# Patient Record
Sex: Female | Born: 1975 | Race: White | Hispanic: No | Marital: Single | Smoking: Former smoker
Health system: Southern US, Community
[De-identification: ages and names within clinical notes are randomized; demographics above are authoritative.]

## PROBLEM LIST (undated history)

## (undated) DIAGNOSIS — F4323 Adjustment disorder with mixed anxiety and depressed mood: Secondary | ICD-10-CM

## (undated) DIAGNOSIS — E119 Type 2 diabetes mellitus without complications: Secondary | ICD-10-CM

## (undated) DIAGNOSIS — E669 Obesity, unspecified: Secondary | ICD-10-CM

## (undated) DIAGNOSIS — R7303 Prediabetes: Secondary | ICD-10-CM

## (undated) DIAGNOSIS — E785 Hyperlipidemia, unspecified: Secondary | ICD-10-CM

## (undated) DIAGNOSIS — I10 Essential (primary) hypertension: Secondary | ICD-10-CM

## (undated) HISTORY — DX: Obesity, unspecified: E66.9

## (undated) HISTORY — DX: Prediabetes: R73.03

## (undated) HISTORY — DX: Adjustment disorder with mixed anxiety and depressed mood: F43.23

---

## 2006-08-02 ENCOUNTER — Other Ambulatory Visit: Admission: RE | Admit: 2006-08-02 | Discharge: 2006-08-02 | Payer: Self-pay | Admitting: Obstetrics and Gynecology

## 2006-08-09 ENCOUNTER — Ambulatory Visit (HOSPITAL_COMMUNITY): Admission: RE | Admit: 2006-08-09 | Discharge: 2006-08-09 | Payer: Self-pay | Admitting: Obstetrics and Gynecology

## 2006-08-09 IMAGING — US US OB COMP LESS 14 WK
1 series · 14 of 28 positions shown · non-contrast
Comparison: none

OBSTETRICAL ULTRASOUND:

 This ultrasound exam was performed in the [HOSPITAL] Ultrasound Department.  The OB US report was generated in the AS system, and faxed to the ordering physician.  This report is also available in [REDACTED] PACS.

[Series 1: us ob comp less 14 wks · 14 of 55 slices shown]
[im 3/55]
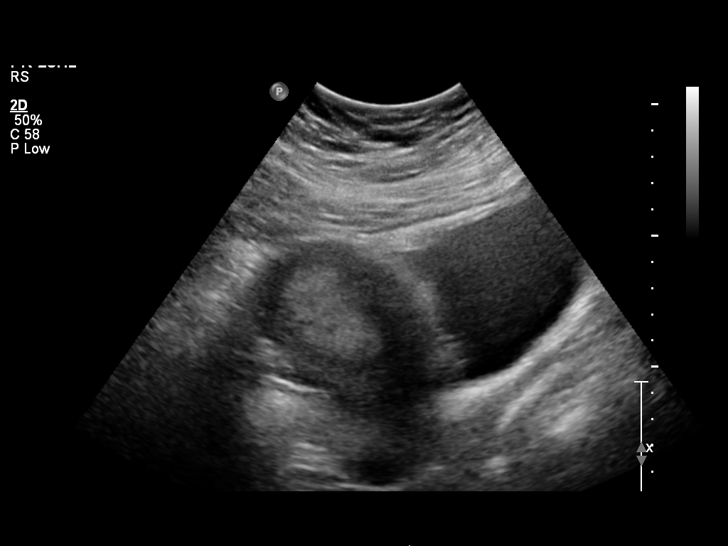
[im 7/55]
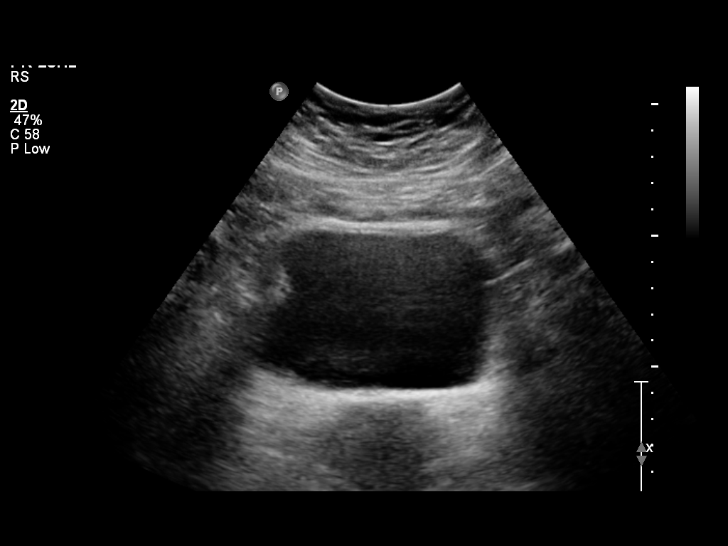
[im 11/55]
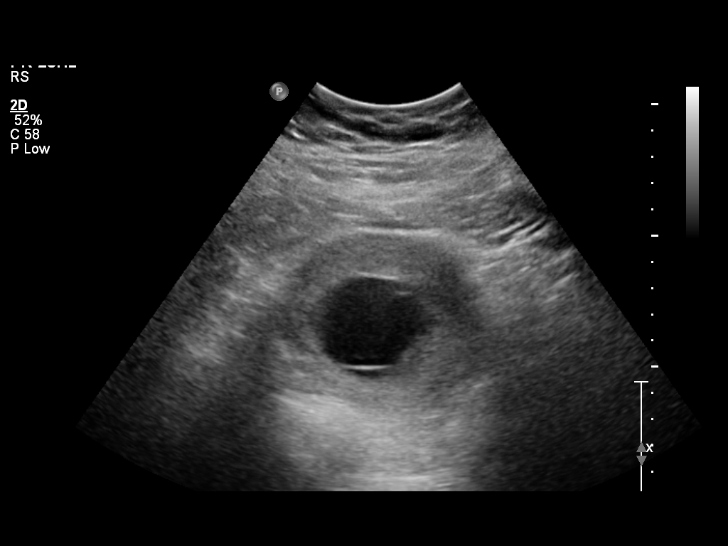
[im 15/55]
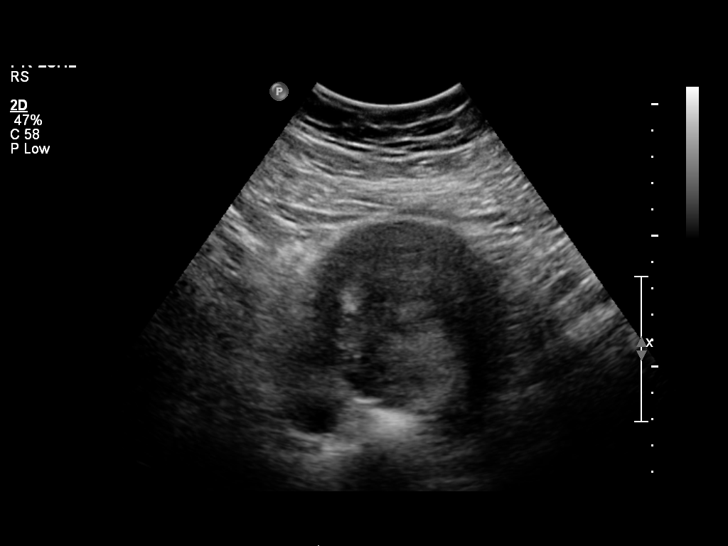
[im 19/55]
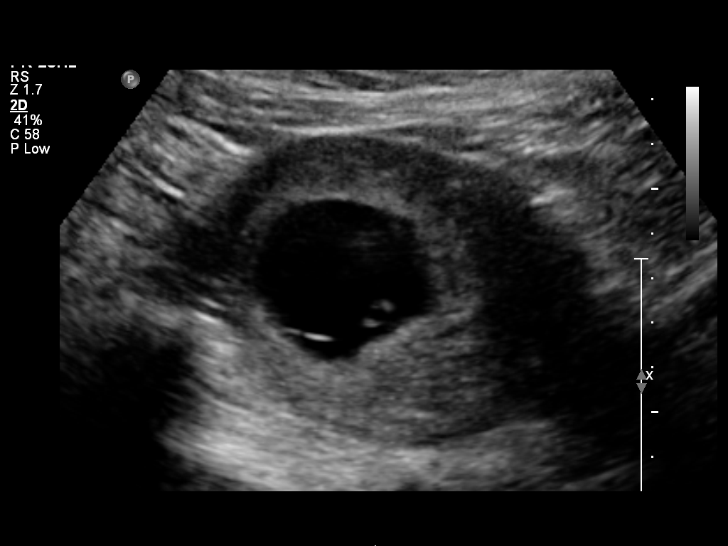
[im 23/55]
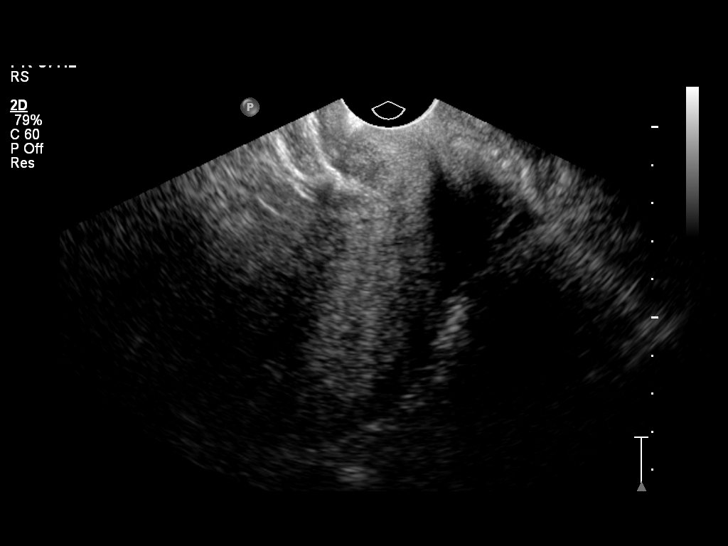
[im 27/55]
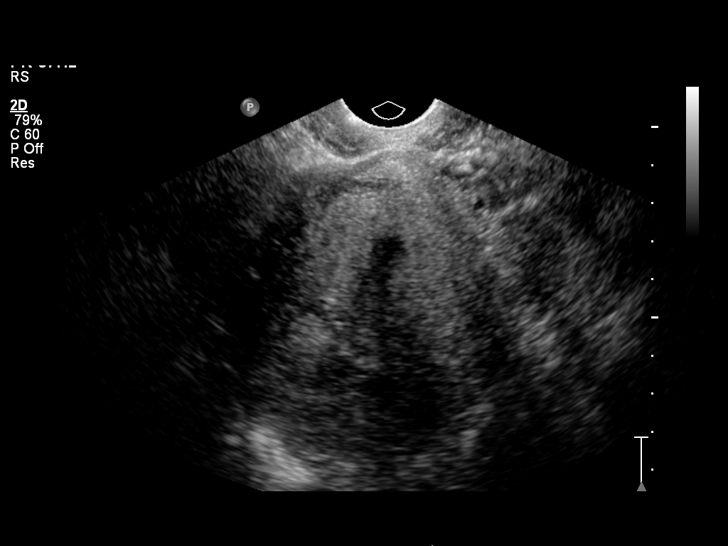
[im 31/55]
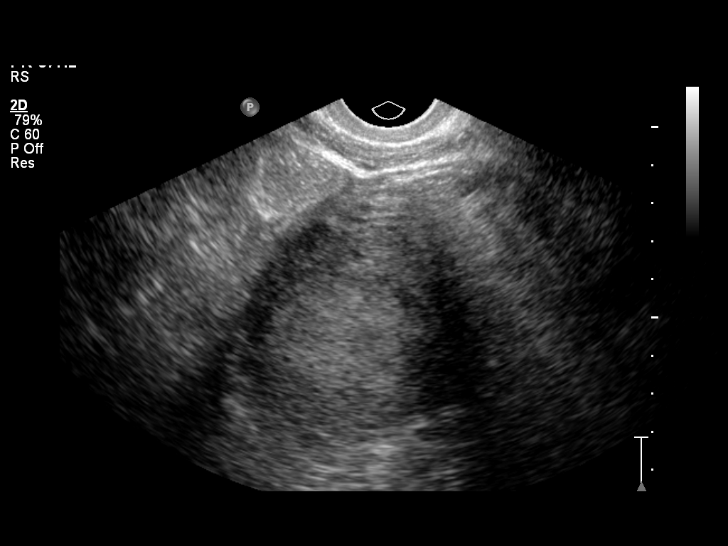
[im 35/55]
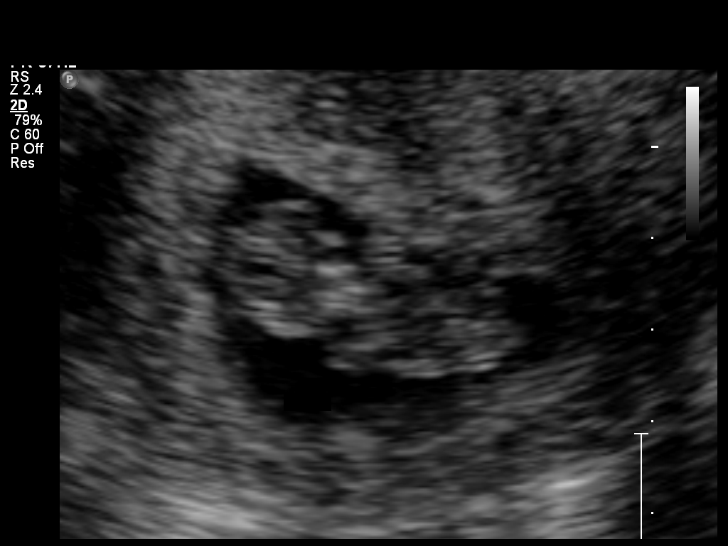
[im 39/55]
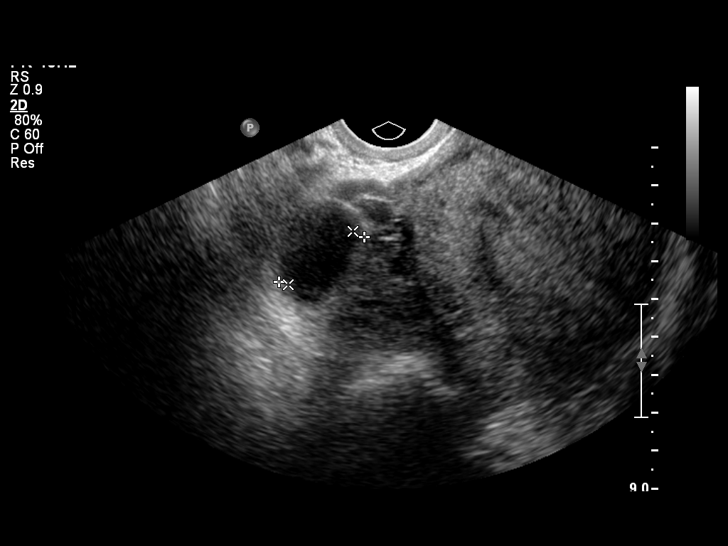
[im 43/55]
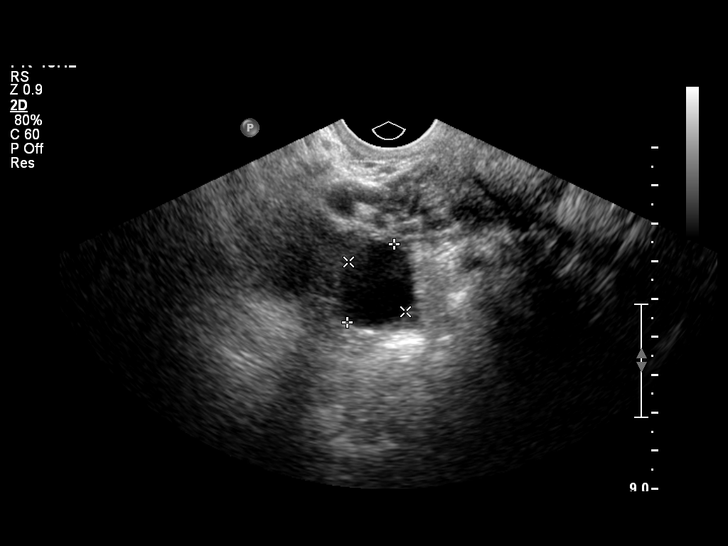
[im 47/55]
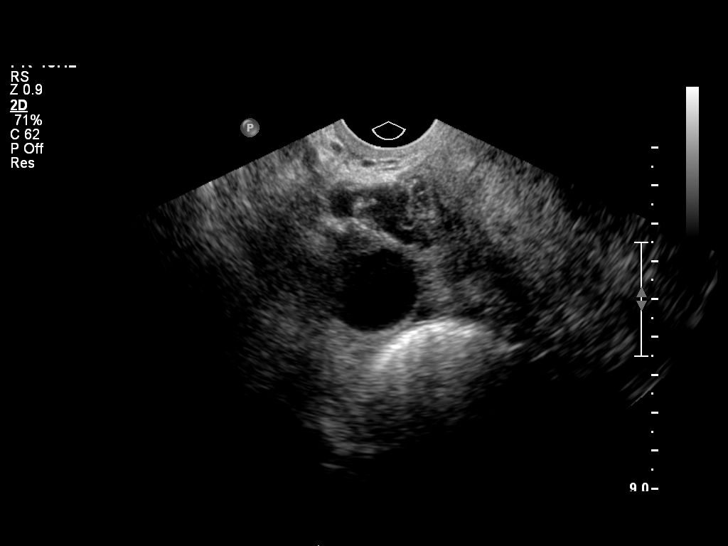
[im 51/55]
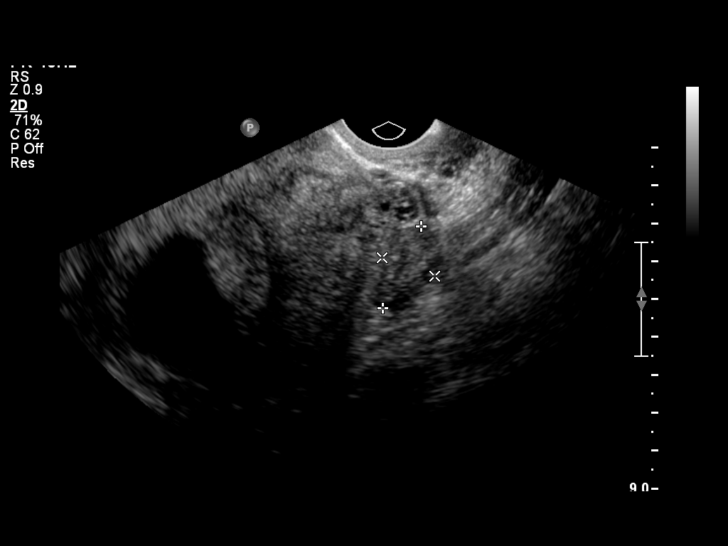
[im 55/55]
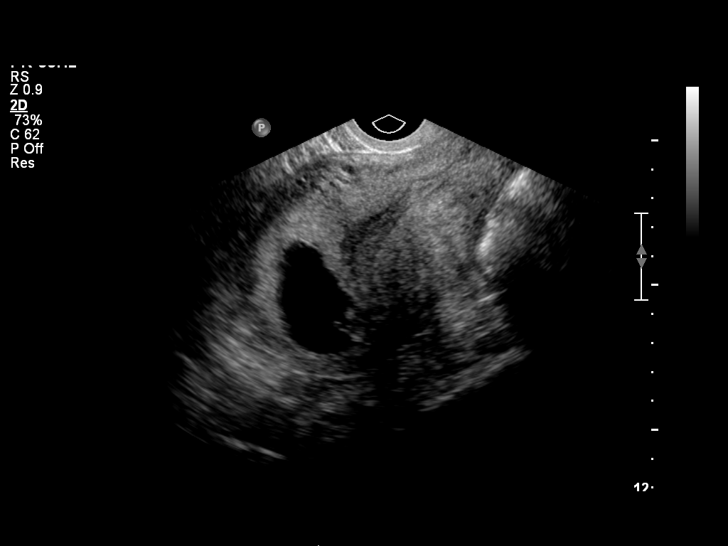

[14 of 28 positions shown; findings below may reference images not displayed]

IMPRESSION: See AS Obstetric US report.

## 2006-10-04 ENCOUNTER — Ambulatory Visit (HOSPITAL_COMMUNITY): Admission: RE | Admit: 2006-10-04 | Discharge: 2006-10-04 | Payer: Self-pay | Admitting: Obstetrics and Gynecology

## 2006-10-04 IMAGING — US US OB COMP +14 WK
1 series · 14 of 28 positions shown · non-contrast
Comparison: none

OBSTETRICAL ULTRASOUND:

 This ultrasound exam was performed in the [HOSPITAL] Ultrasound Department.  The OB US report was generated in the AS system, and faxed to the ordering physician.  This report is also available in [REDACTED] PACS.

[Series 1: us ob comp +14 wk · 0.35mm/px · 14 of 71 slices shown]
[im 3/71]
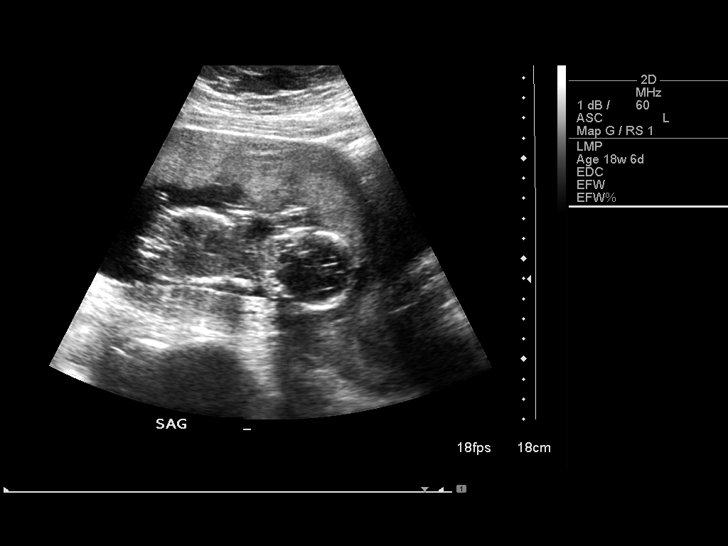
[im 8/71]
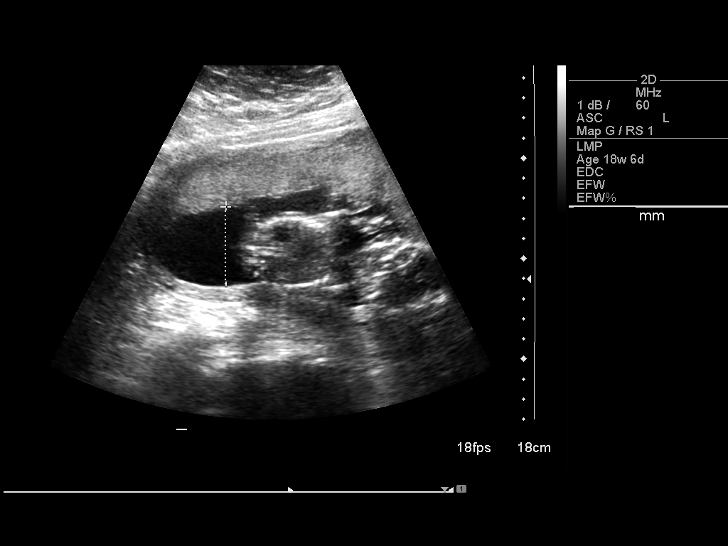
[im 13/71]
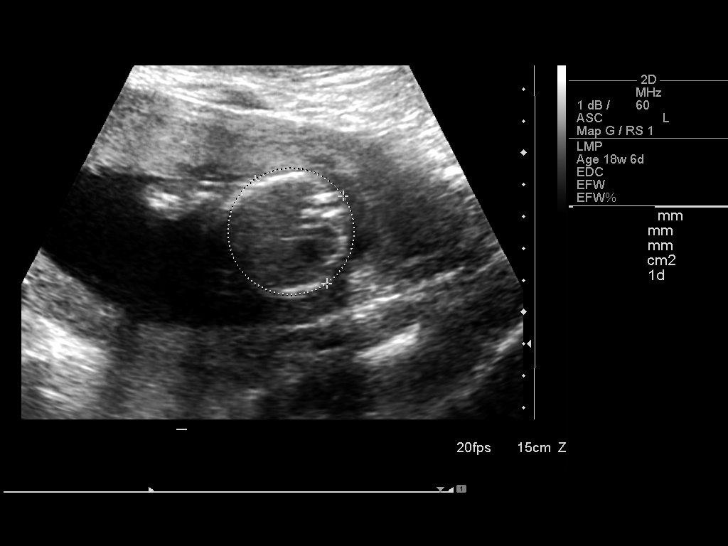
[im 19/71]
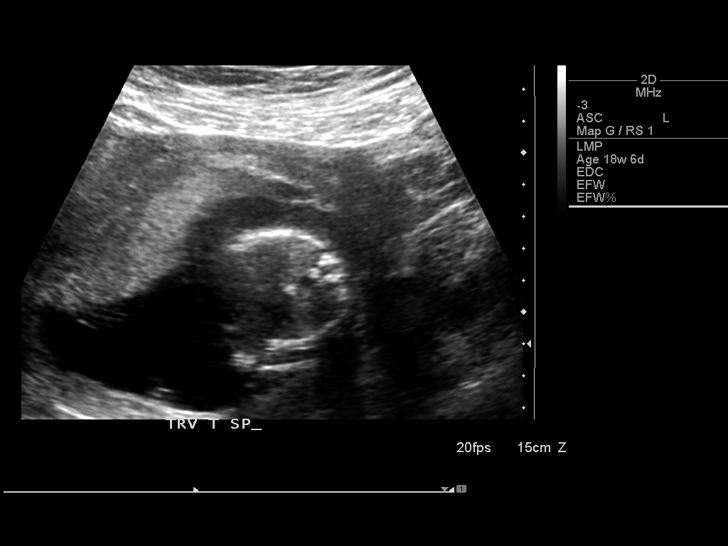
[im 24/71]
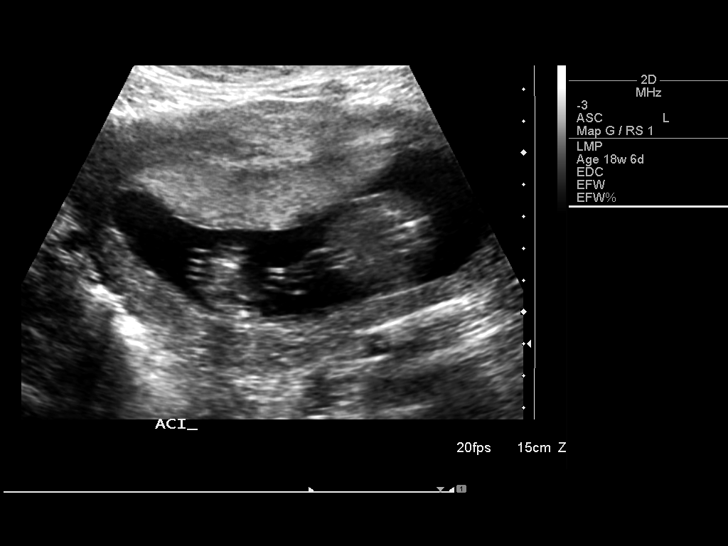
[im 29/71]
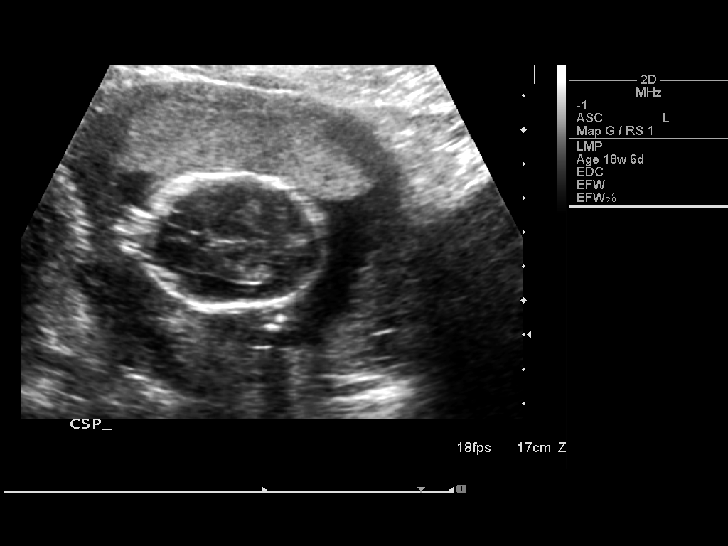
[im 34/71]
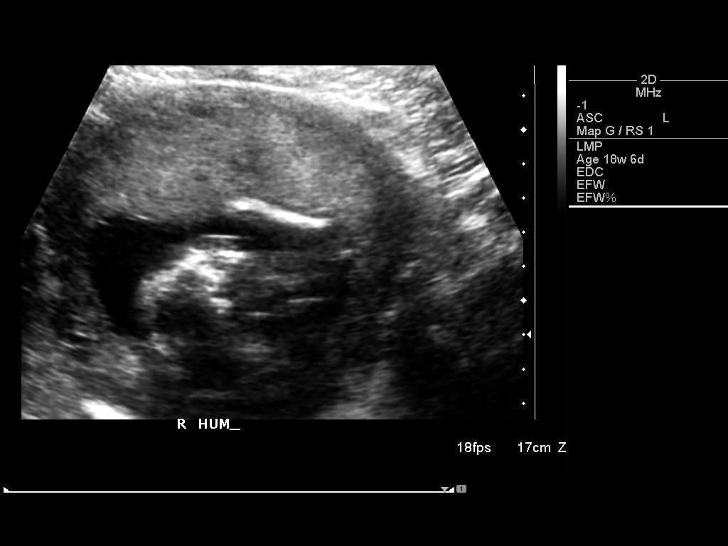
[im 39/71]
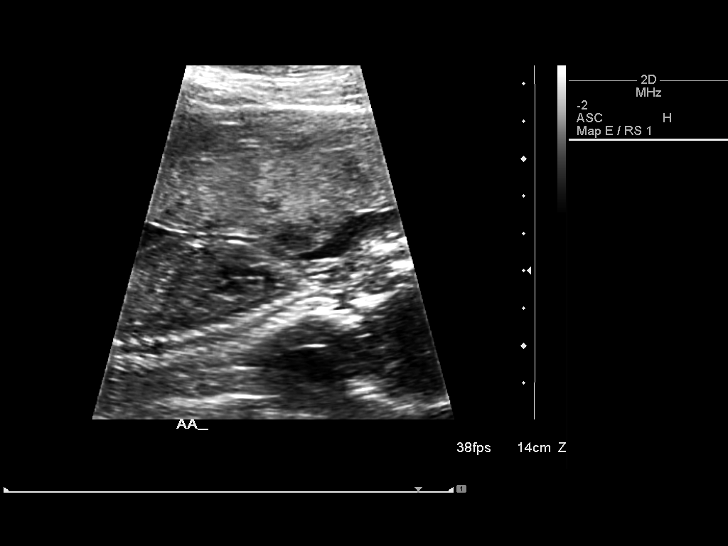
[im 45/71]
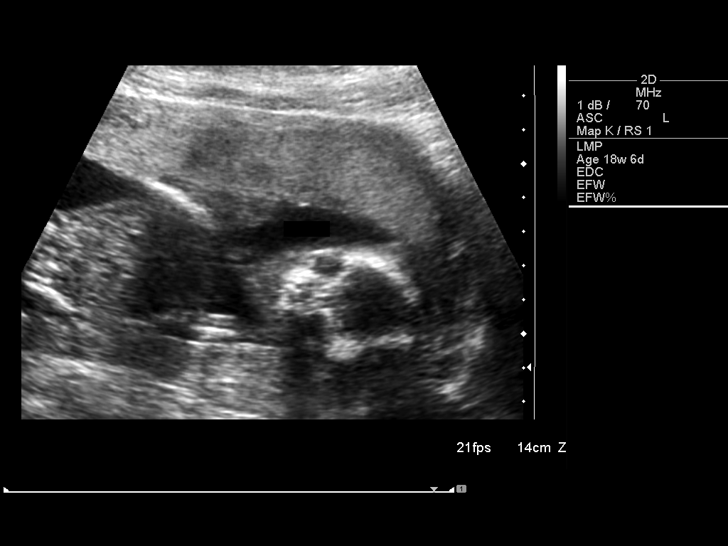
[im 50/71]
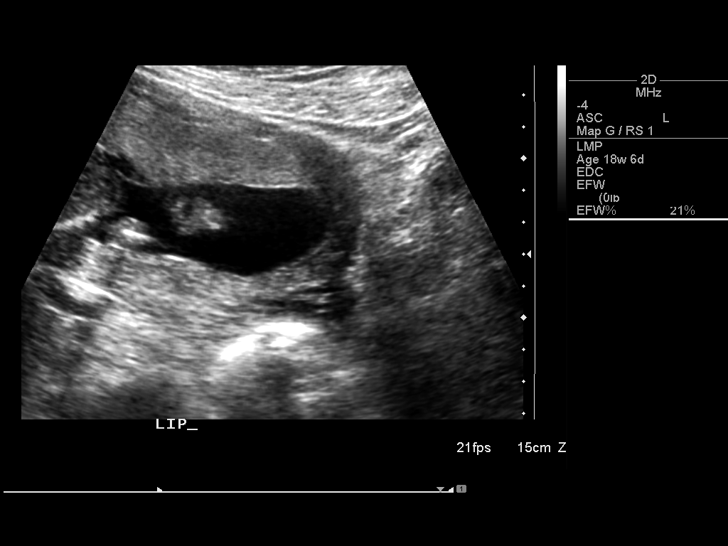
[im 55/71]
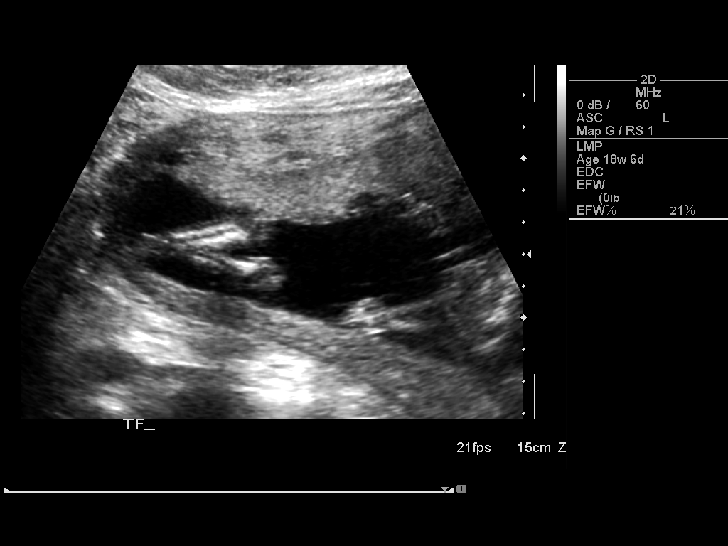
[im 60/71]
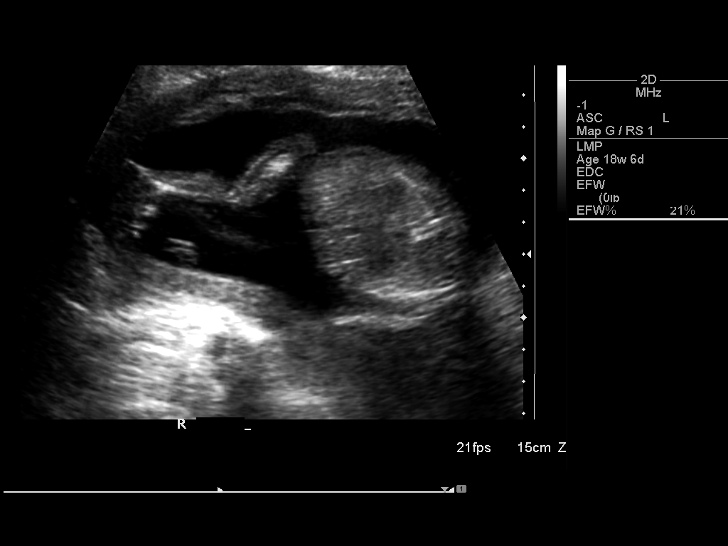
[im 65/71]
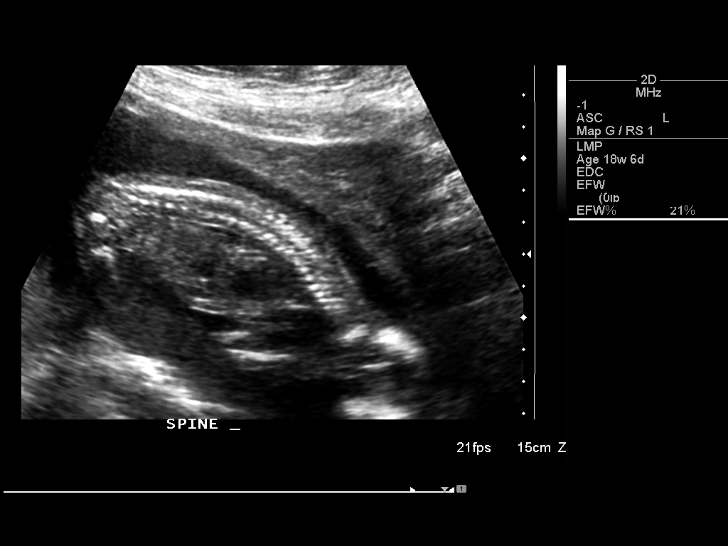
[im 71/71]
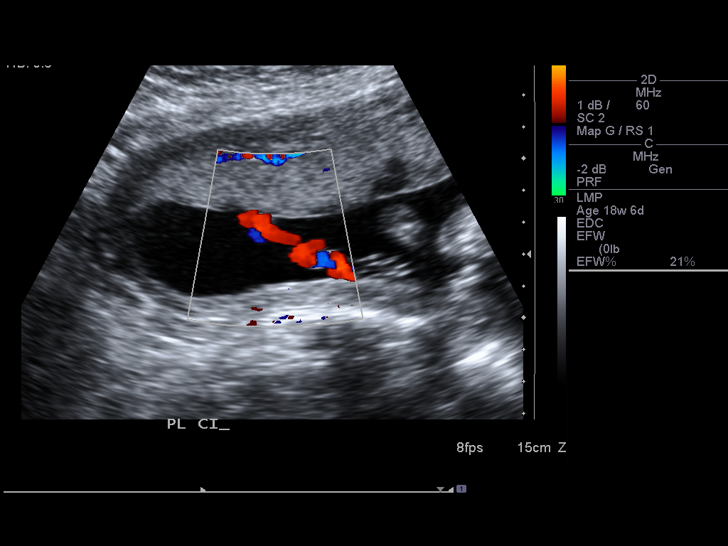

[14 of 28 positions shown; findings below may reference images not displayed]

IMPRESSION: See AS Obstetric US report.

## 2006-10-18 ENCOUNTER — Ambulatory Visit (HOSPITAL_COMMUNITY): Admission: RE | Admit: 2006-10-18 | Discharge: 2006-10-18 | Payer: Self-pay | Admitting: Obstetrics and Gynecology

## 2006-10-18 IMAGING — US US OB FOLLOW-UP
1 series · 14 of 28 positions shown · non-contrast
Comparison: none

OBSTETRICAL ULTRASOUND:

 This ultrasound exam was performed in the [HOSPITAL] Ultrasound Department.  The OB US report was generated in the AS system, and faxed to the ordering physician.  This report is also available in [REDACTED] PACS.

[Series 1: us ob re-eval · 14 of 37 slices shown]
[im 2/37]
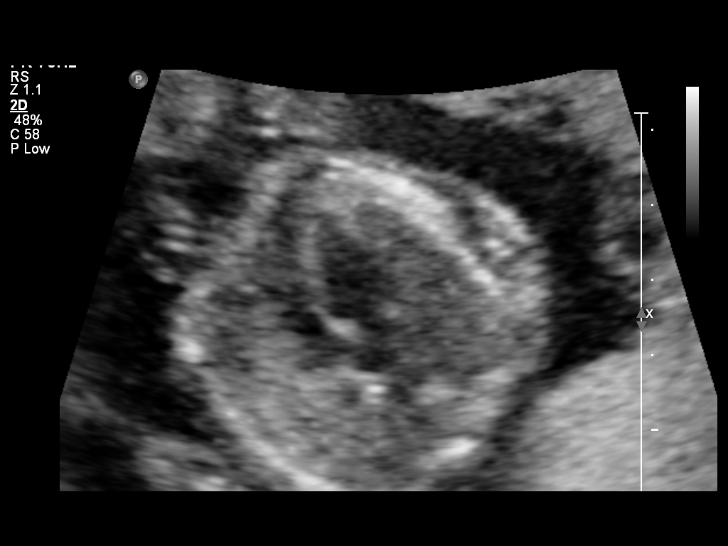
[im 5/37]
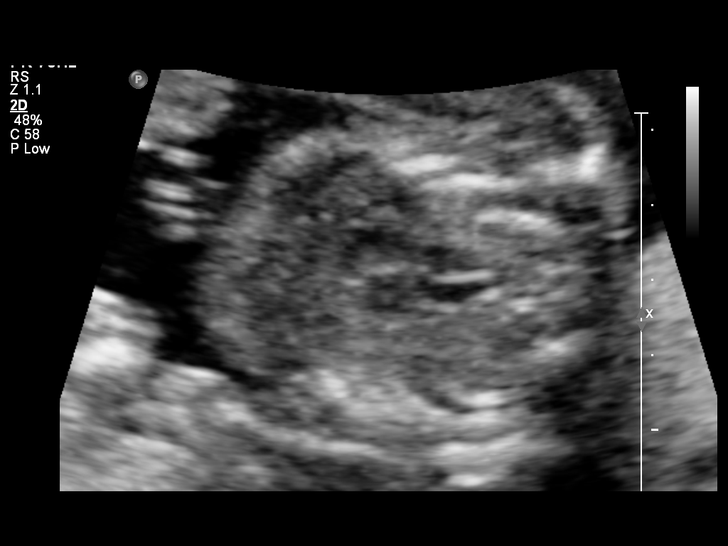
[im 7/37]
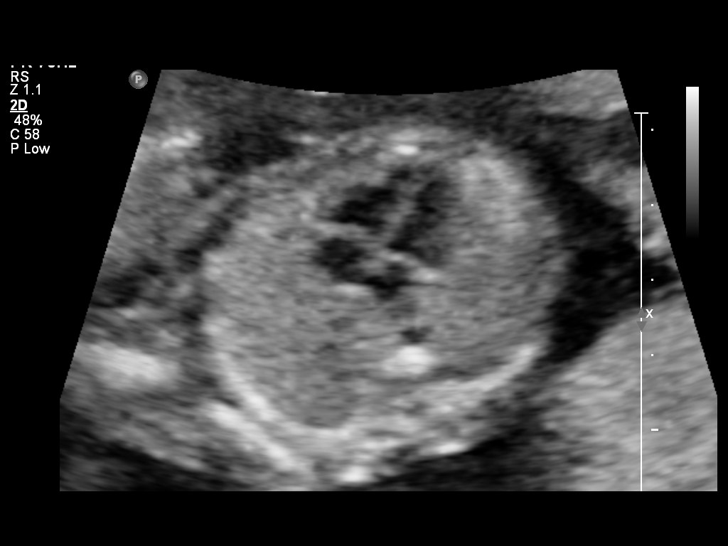
[im 10/37]
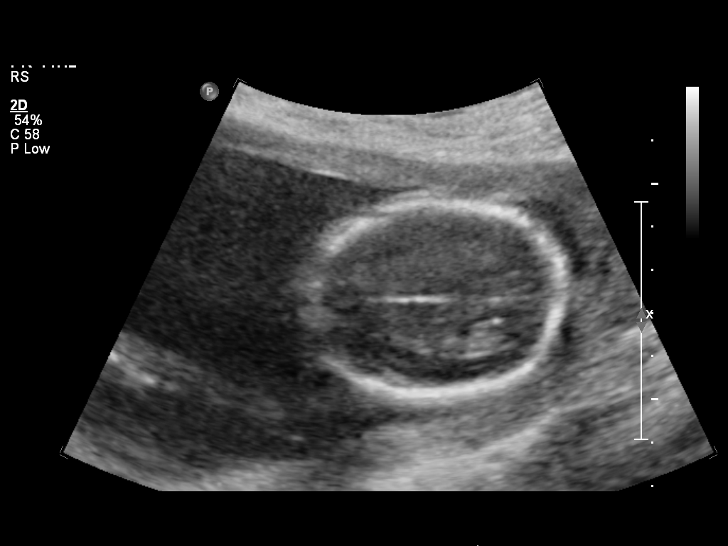
[im 13/37]
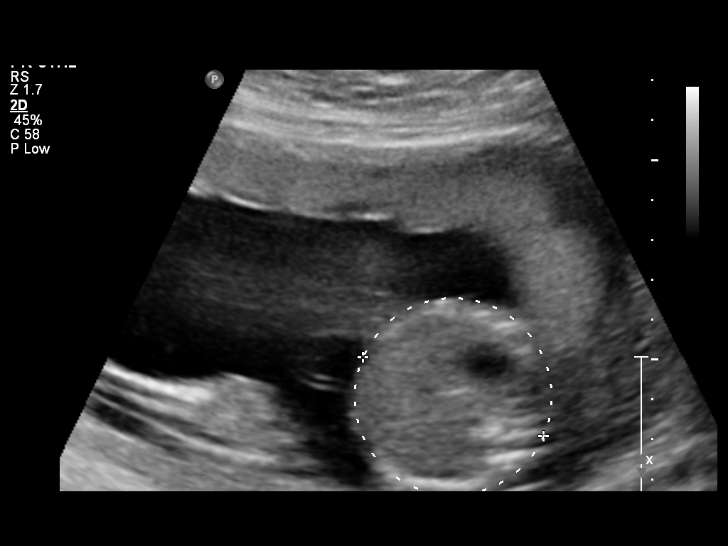
[im 15/37]
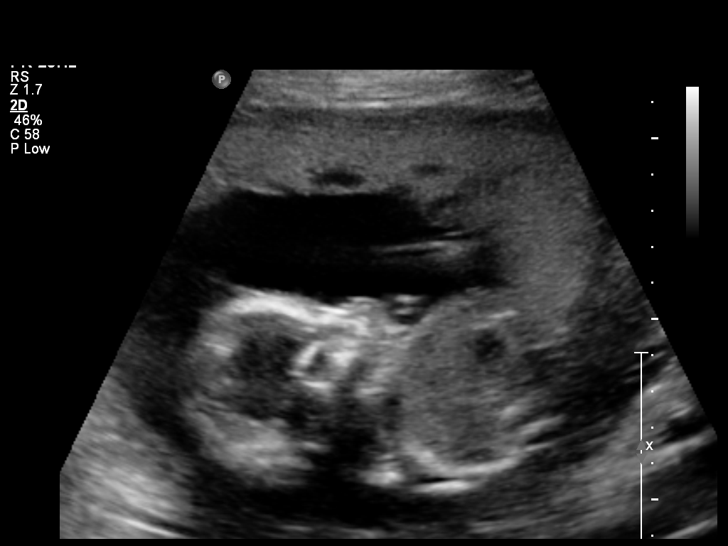
[im 18/37]
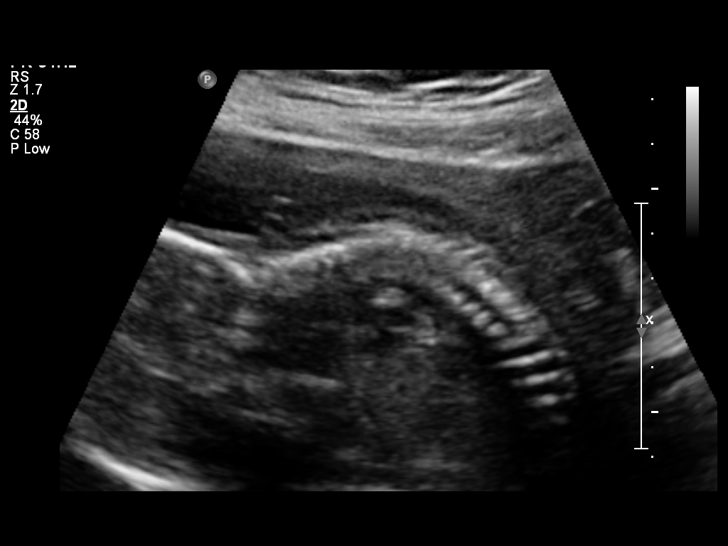
[im 21/37]
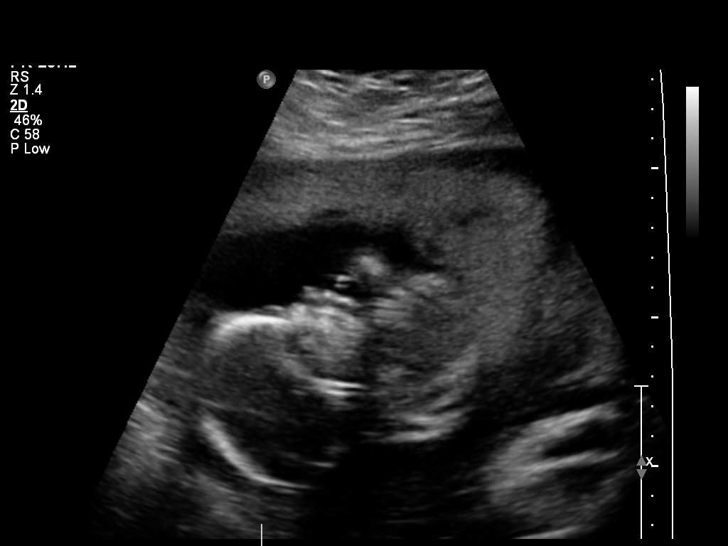
[im 23/37]
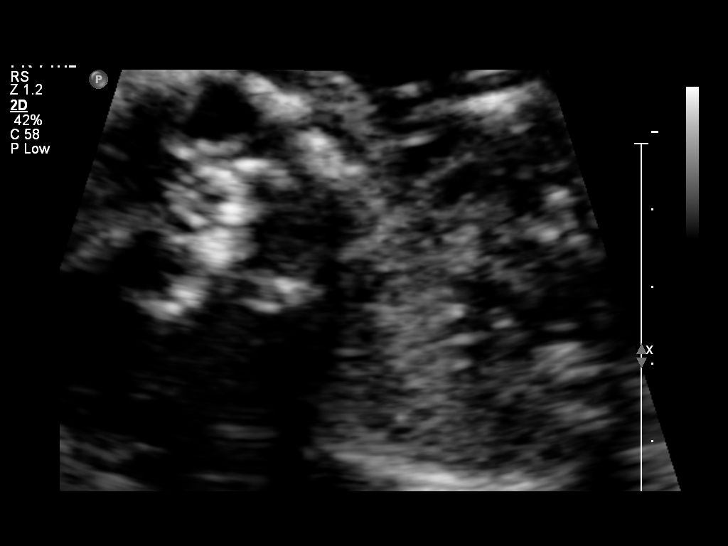
[im 26/37]
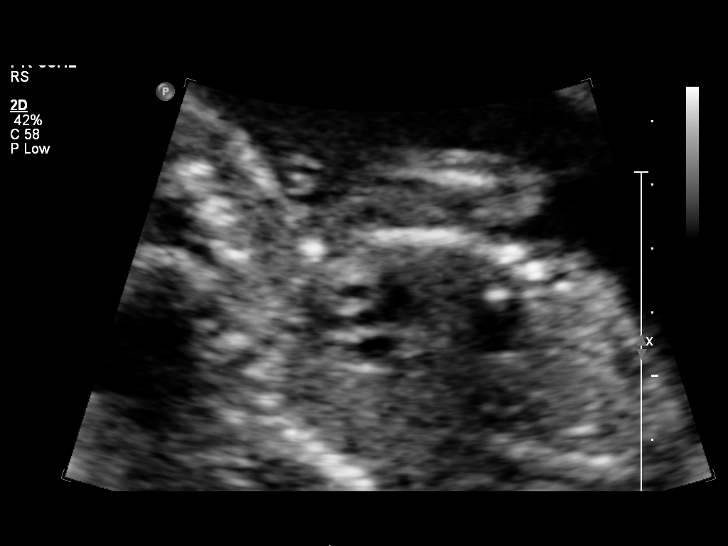
[im 29/37]
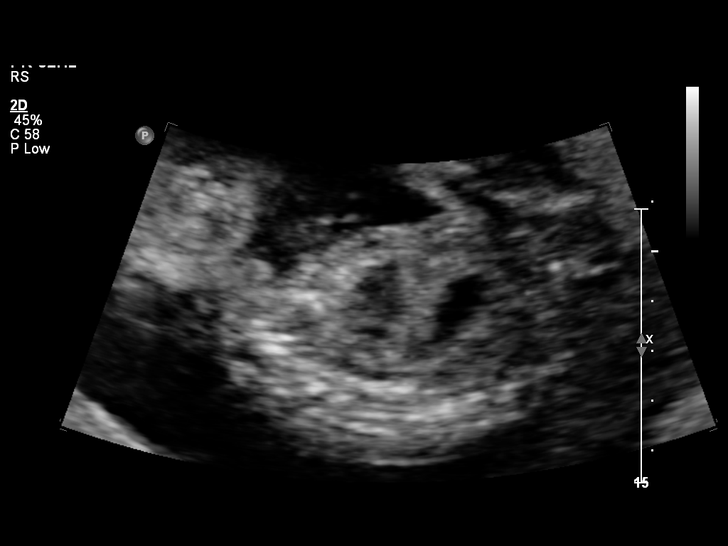
[im 31/37]
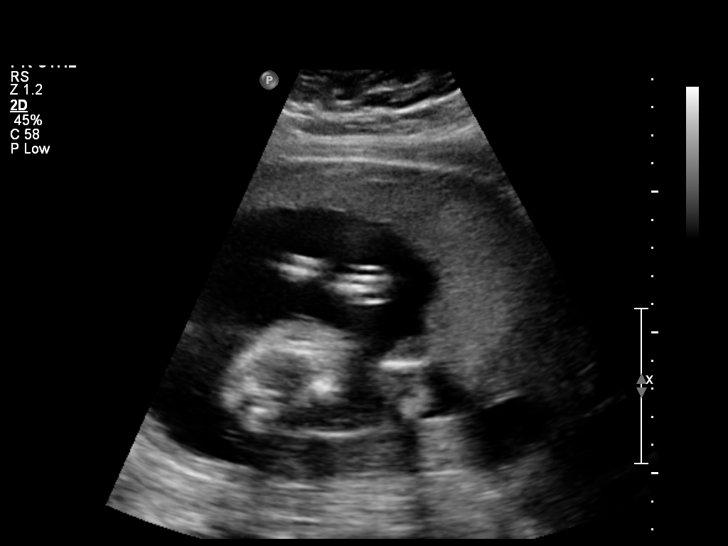
[im 34/37]
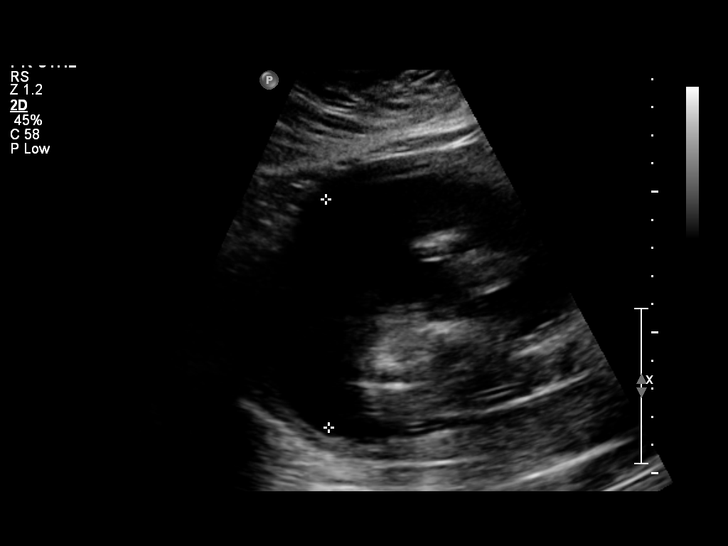
[im 37/37]
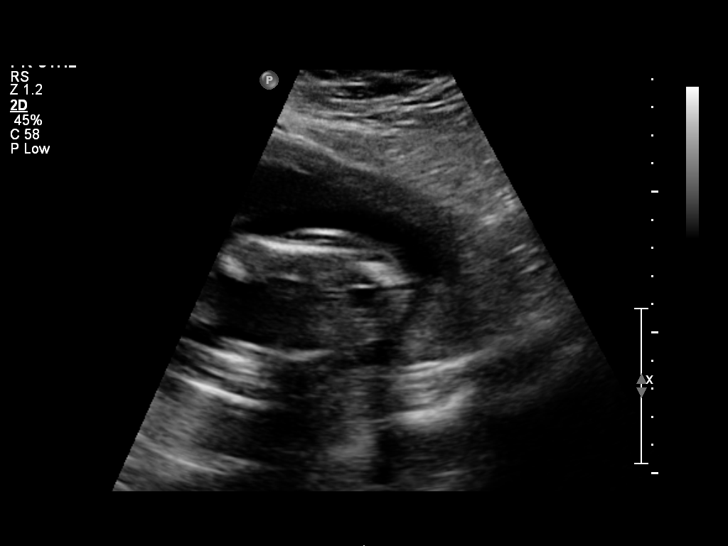

[14 of 28 positions shown; findings below may reference images not displayed]

IMPRESSION: See AS Obstetric US report.

## 2007-02-22 ENCOUNTER — Inpatient Hospital Stay (HOSPITAL_COMMUNITY): Admission: RE | Admit: 2007-02-22 | Discharge: 2007-02-24 | Payer: Self-pay | Admitting: Obstetrics and Gynecology

## 2007-03-22 ENCOUNTER — Other Ambulatory Visit: Admission: RE | Admit: 2007-03-22 | Discharge: 2007-03-22 | Payer: Self-pay | Admitting: Obstetrics and Gynecology

## 2010-04-08 ENCOUNTER — Encounter
Admission: RE | Admit: 2010-04-08 | Discharge: 2010-04-08 | Payer: Self-pay | Source: Home / Self Care | Attending: Obstetrics and Gynecology | Admitting: Obstetrics and Gynecology

## 2010-04-08 IMAGING — MG MM DIGITAL SCREENING
2 series · 2 of 2 positions shown · non-contrast
Comparison: none

DG SCREEN MAMMOGRAM BILATERAL
Bilateral CC and MLO view(s) were taken.

DIGITAL SCREENING MAMMOGRAM WITH CAD:
There are scattered fibroglandular densities.  A possible mass is noted in the left breast.  Spot 
compression views and possibly sonography are recommended for further evaluation.  In the right 
breast, no masses or malignant type calcifications are identified.
Images were processed with CAD.

[L CC]
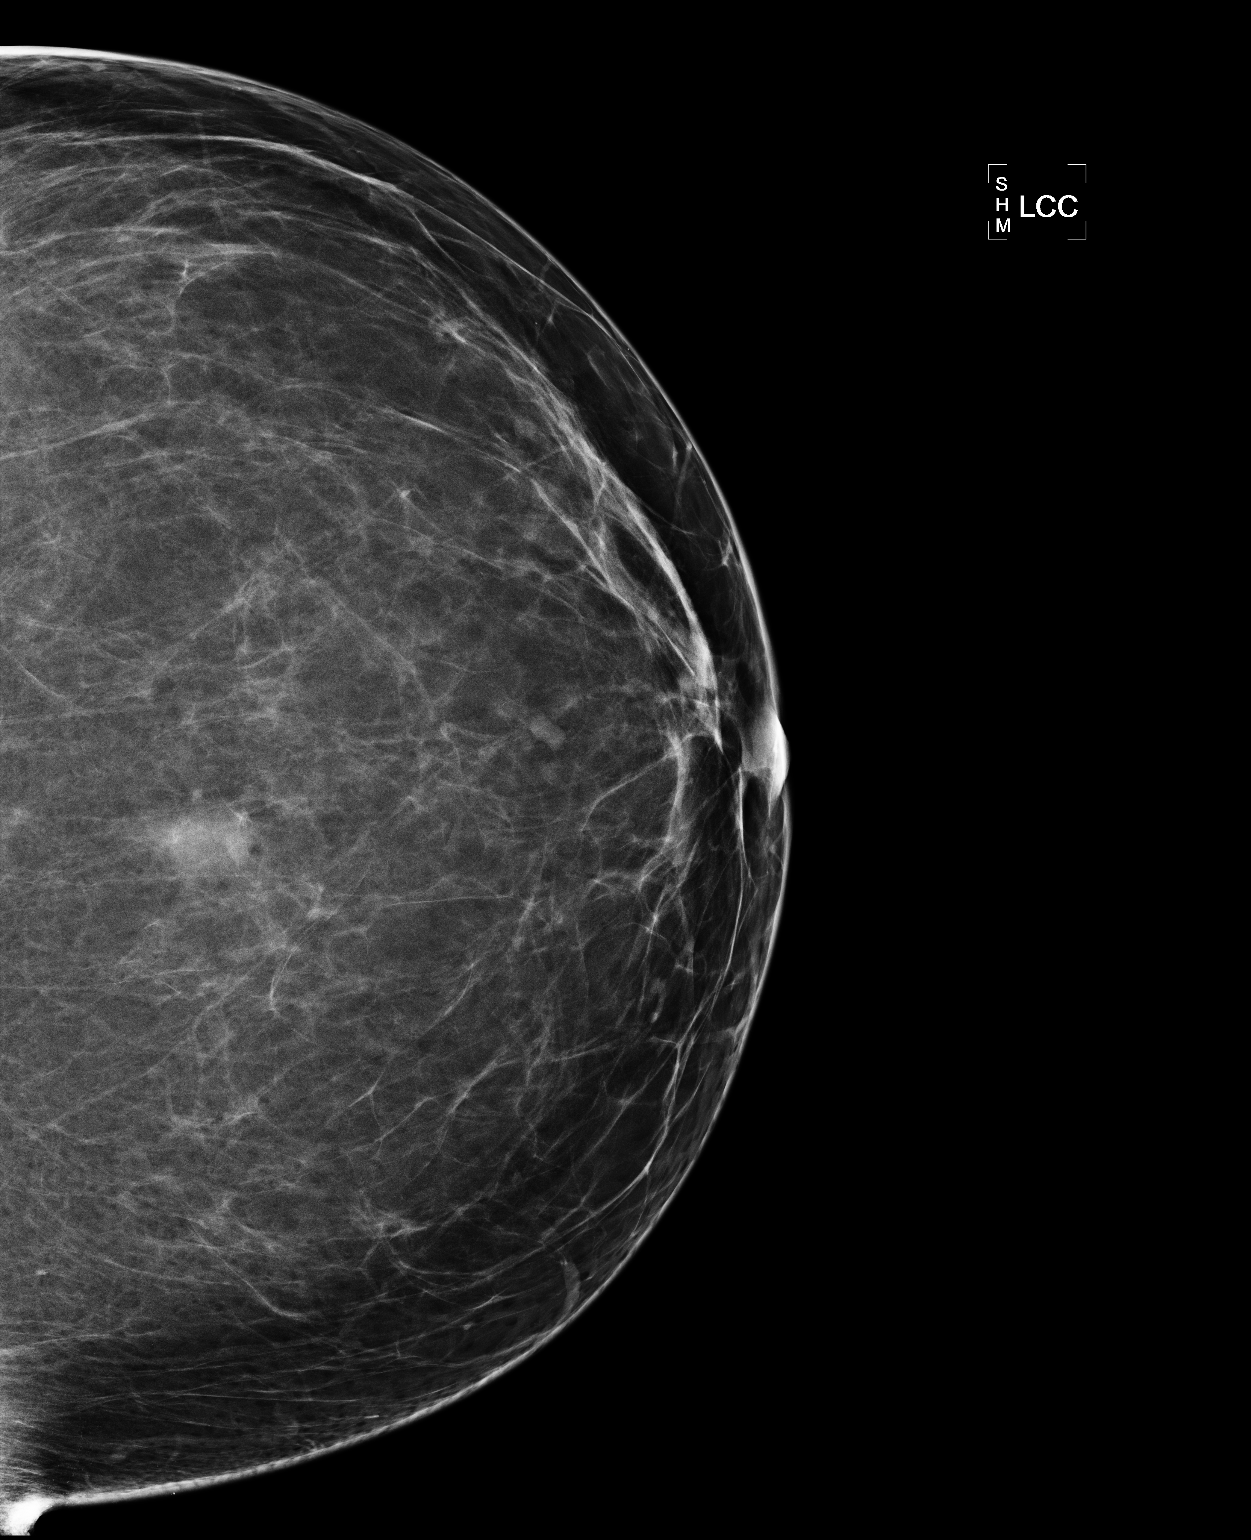

[R MLO]
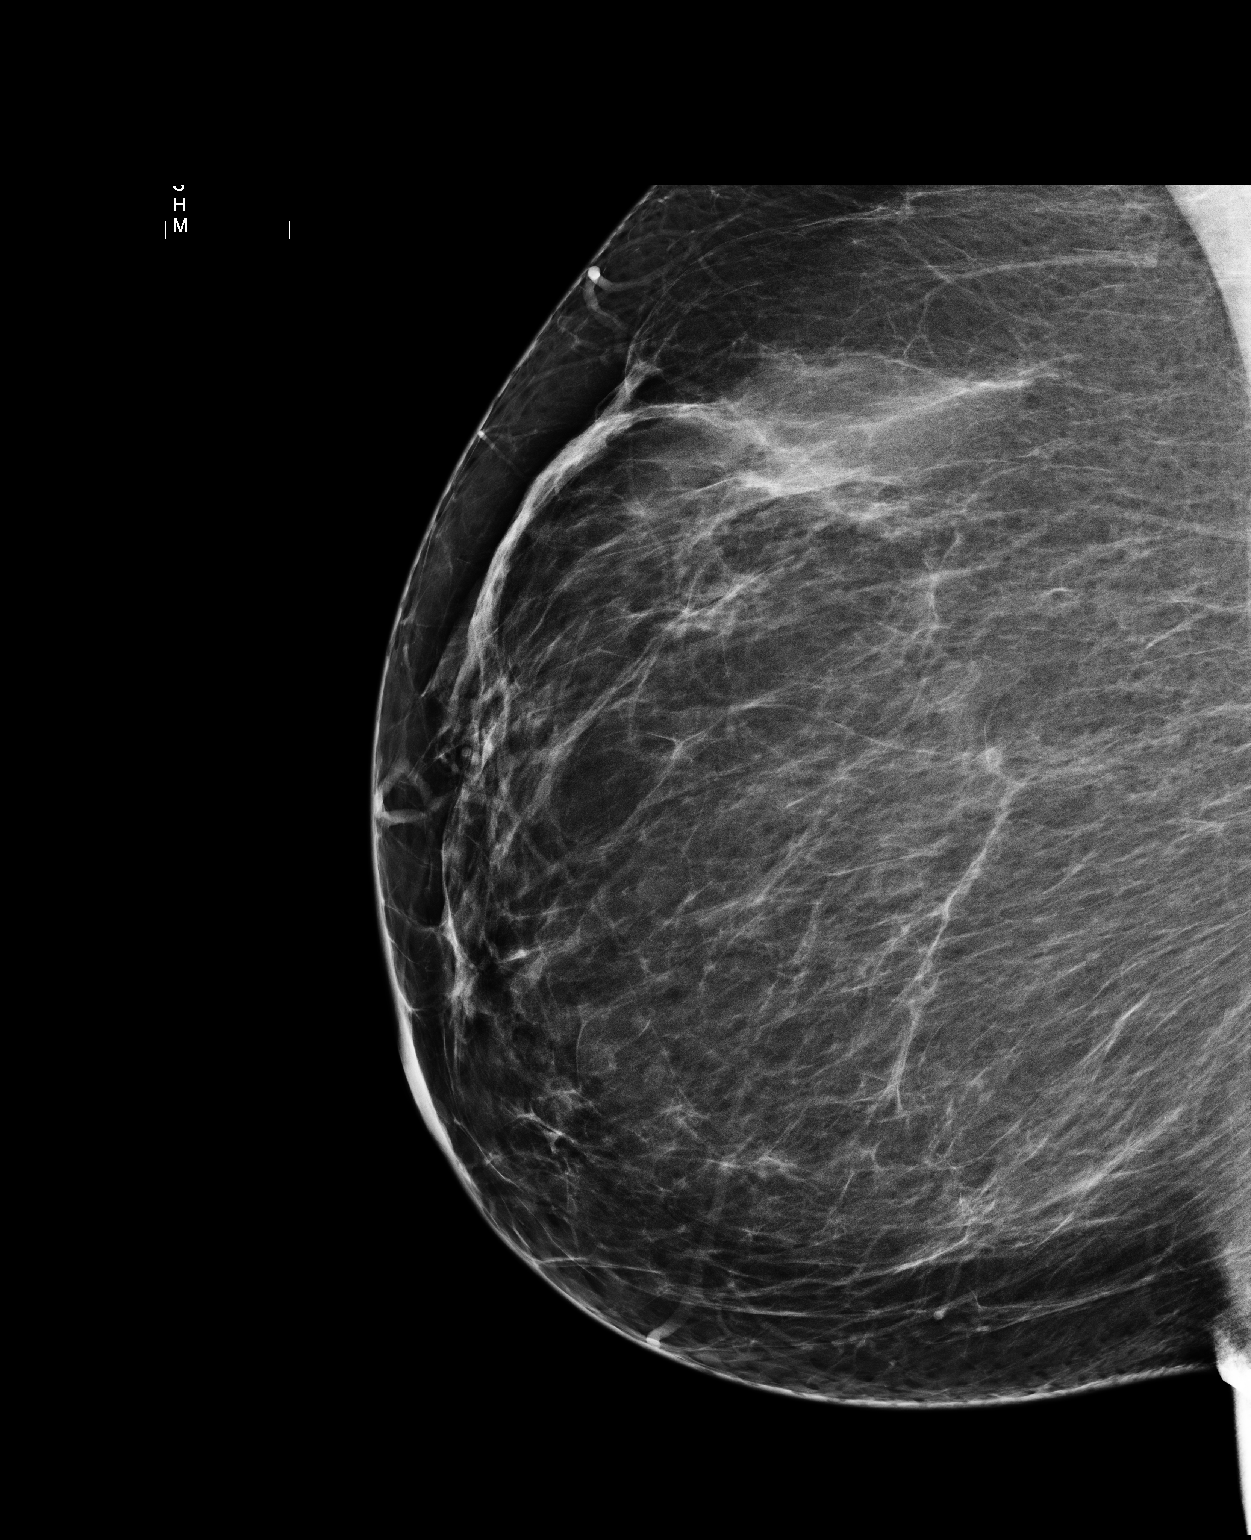

[2 of 2 positions shown; findings below may reference images not displayed]

IMPRESSION: Possible mass, left breast.  Additional evaluation is indicated.  The patient will be contacted for
additional studies and a supplementary report will follow.  No specific mammographic evidence of 
malignancy, right breast.

ASSESSMENT: Need additional imaging evaluation and/or prior mammograms for comparison - BI-RADS 0

Further imaging of the left breast.
,

## 2010-04-22 ENCOUNTER — Encounter
Admission: RE | Admit: 2010-04-22 | Discharge: 2010-04-22 | Payer: Self-pay | Source: Home / Self Care | Attending: Family Medicine | Admitting: Family Medicine

## 2010-04-22 IMAGING — MG MM DIGITAL DIAG LTD L
2 series · 2 of 2 positions shown · non-contrast
Comparison: [DATE]

CLINICAL DATA: Possible mass left breast identified on recent
baseline screening mammogram.

DIGITAL DIAGNOSTIC LEFT MAMMOGRAM WITHOUT CAD AND LEFT BREAST
ULTRASOUND:

[L CC]
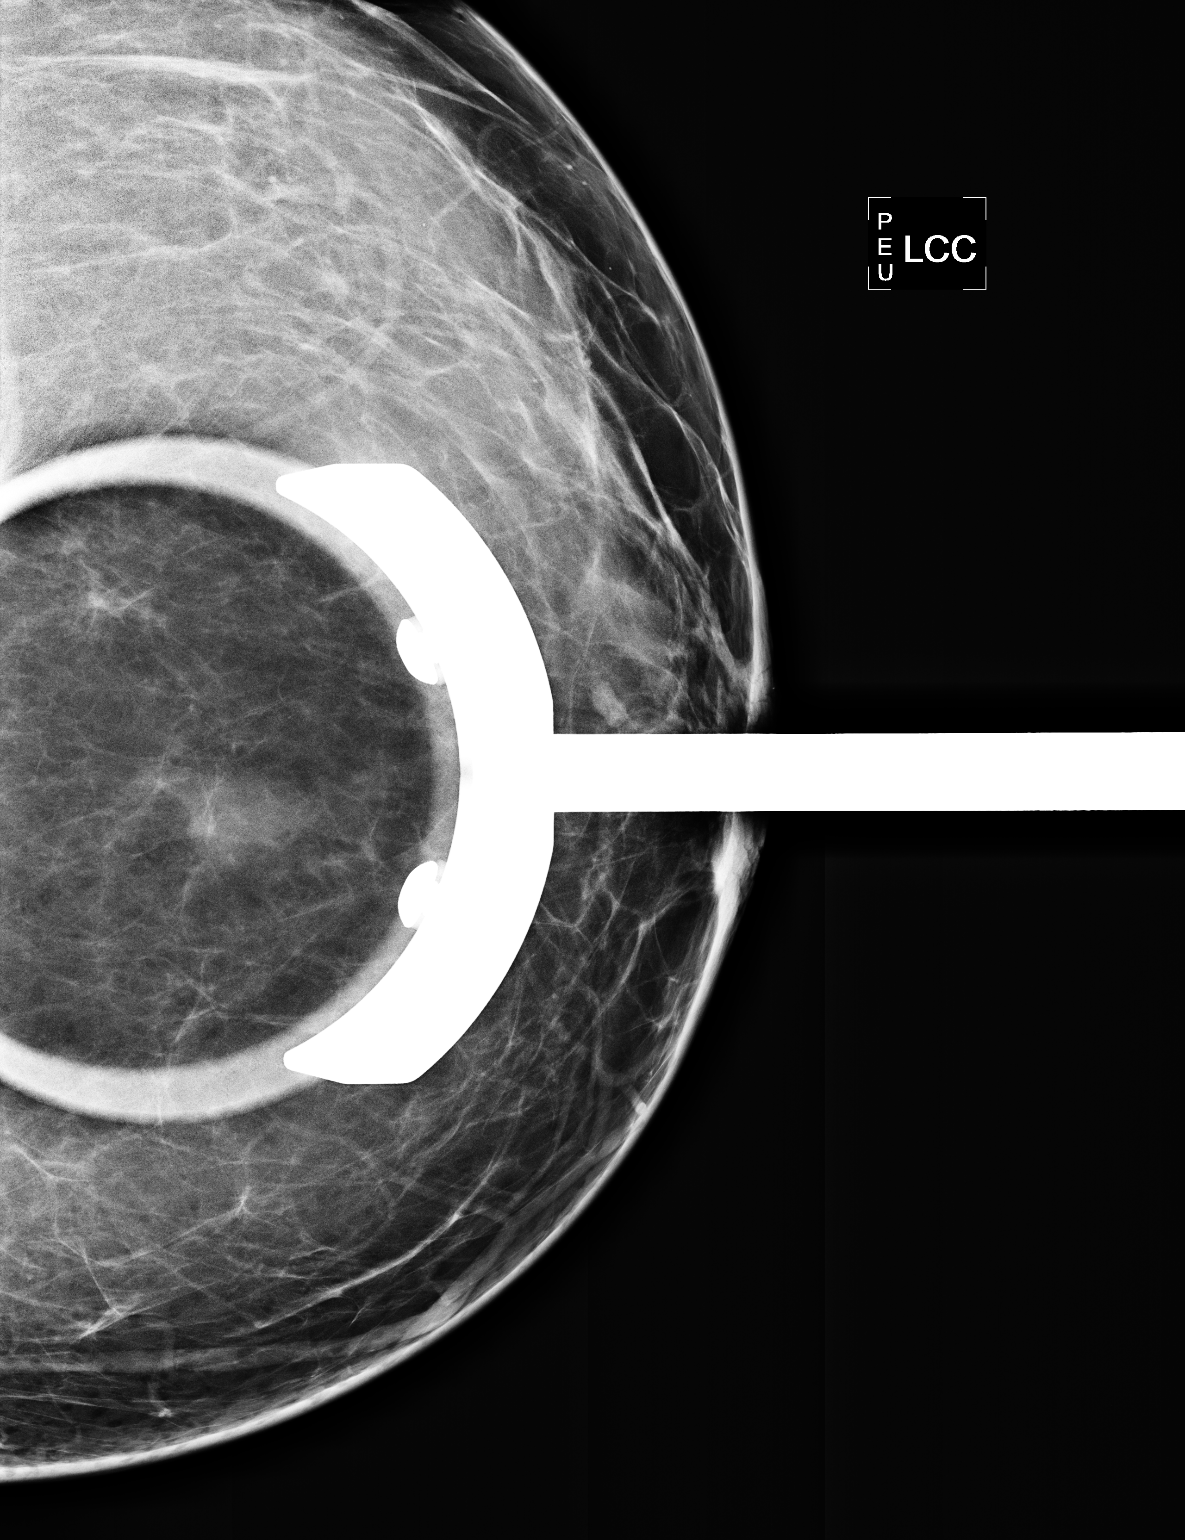

[L MLO]
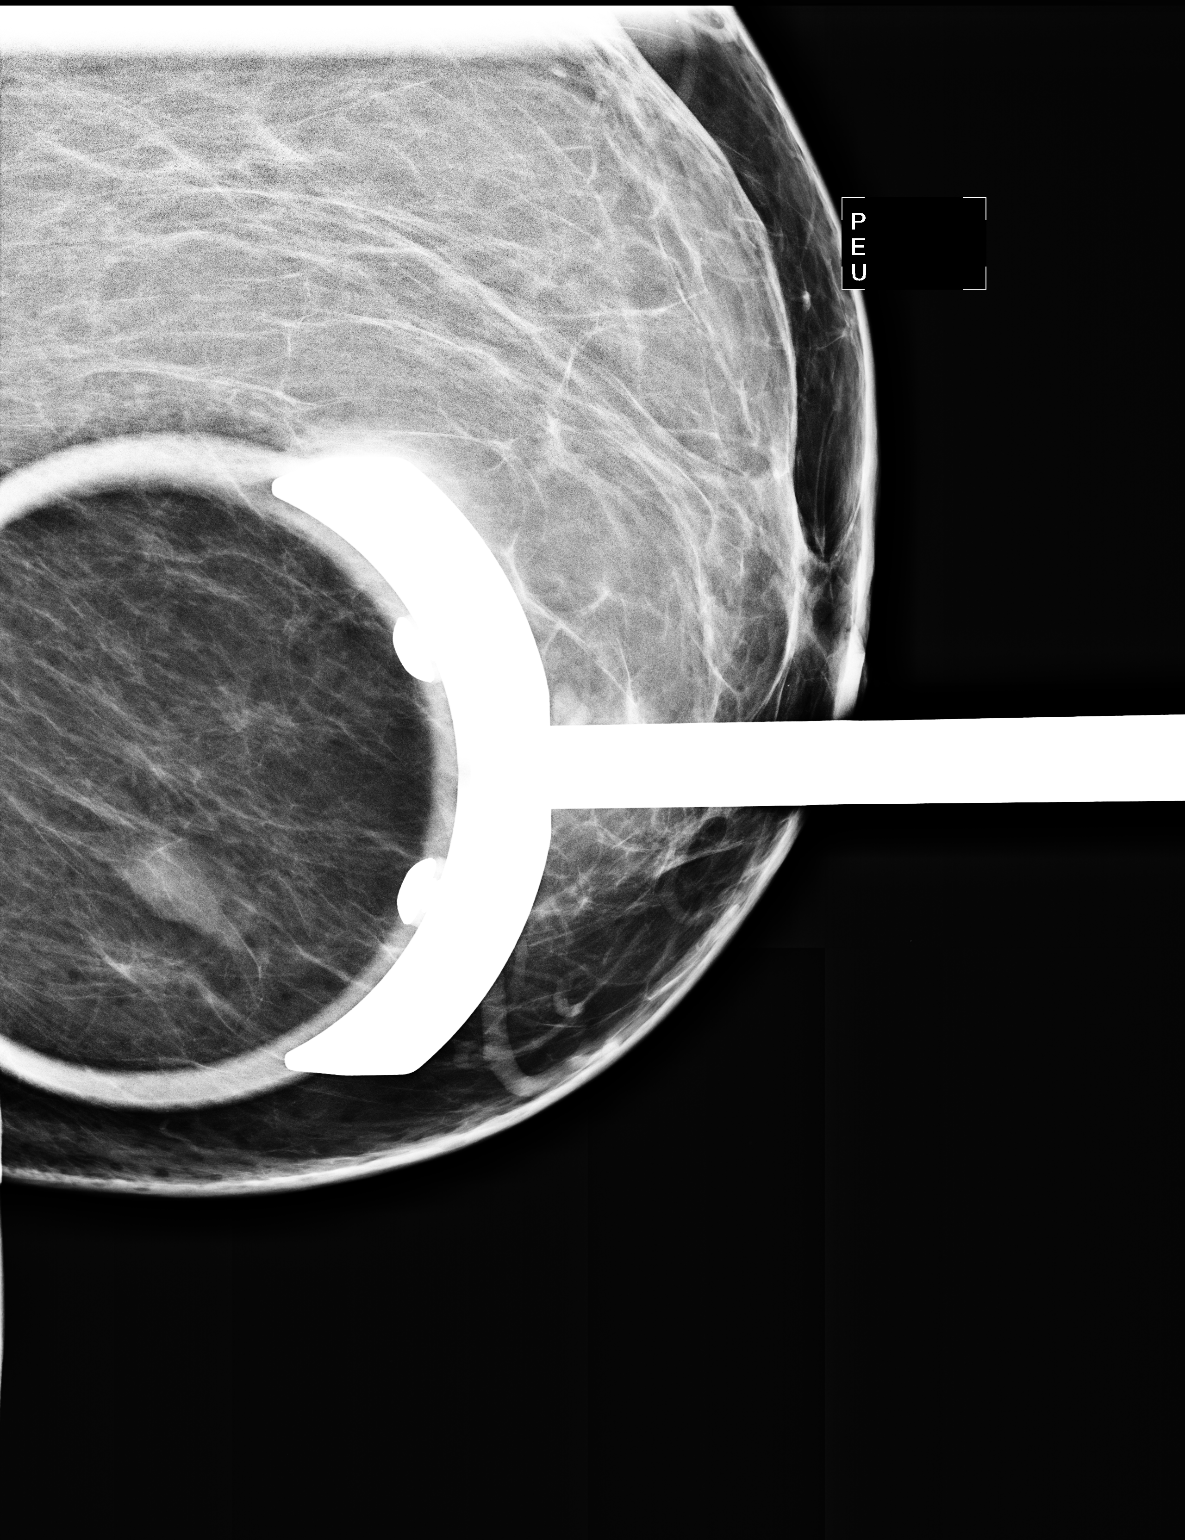

[2 of 2 positions shown; findings below may reference images not displayed]

FINDINGS: Spot compression views of the lower central left breast
show an oval, partially circumscribed and partially obscured mass.

On physical exam, no mass is palpated in the inferior left breast.

Ultrasound is performed, showing a circumscribed slightly
hypoechoic oval mass with some internal echogenic bands that
measures 1.5 x 0.8 x 1.4 cm at 7 o'clock position 8 cm from the
nipple.  No internal vascular flow is identified.
IMPRESSION: Probable fibroadenoma 7 o'clock position left breast.  The options
of follow-up ultrasounds in 6, 12, and 24 months, versus ultrasound-
guided core biopsy versus surgical excision were discussed with the
patient.  She prefers ultrasound follow-up.

Left breast ultrasound in 6 months is recommended.

BI-RADS CATEGORY 3:  Probably benign finding(s) - short interval
follow-up suggested.

## 2010-08-24 NOTE — Discharge Summary (Signed)
Lori Schmidt, Lori Schmidt             ACCOUNT NO.:  [id number]   MEDICAL RECORD NO.:  [id number]          PATIENT TYPE:  INP   LOCATION:  9125                          FACILITY:  [facility]   PHYSICIAN:  James A. Ashley Royalty, M.D.DATE OF BIRTH:  09/29/1975   DATE OF ADMISSION:  02/22/2007  DATE OF DISCHARGE:  02/24/2007                               DISCHARGE SUMMARY   DISCHARGE DIAGNOSES:  1. Intrauterine pregnancy at [redacted] weeks gestation, delivered.  2. Term birth living child, vertex.   OPERATIONS AND PROCEDURES:  OB delivery with repair of second-degree  midline laceration.   CONSULTATIONS:  None.   DISCHARGE MEDICATIONS:  Ibuprofen.   HISTORY AND PHYSICAL:  A 31-year gravida 2, para 1 at 39+ weeks'  gestation.  Prenatal care complicated by obesity, glucose intolerance.  The patient admitted for induction secondary to advanced cervical  changes and musculoskeletal discomfort.  Initial cervical exam revealed  the cervix to be 3-4 cm dilated.  For the remainder of the history  physical, please see chart.   HOSPITAL COURSE:  The patient was admitted to [facility].  Admission laboratory studies were drawn.  Artificial rupture of  membranes was accomplished which revealed clear fluid.  The patient went  on to deliver on February 22, 2007.  The infant was a 7-pound 4-ounce  female, Apgars 8 at one minute and 9 at five minutes, sent to the  newborn nursery.  Delivery was accomplished by Dr. Sylvester Harder from a  +3 station, occiput anterior with the vacuum extraction.  The vacuum  extractor secondary to variable decelerations in the late second stage.  Delivery was accomplished over an intact perineum.  There was a second-  degree midline laceration which was repaired without difficulty.  The  patient's postpartum course was benign.  She was discharged on the  second postpartum day afebrile and in satisfactory condition.   DISPOSITION:  The patient is to return to [facility]  and  Obstetrics in approximately four weeks for postpartum evaluation.      James A. Ashley Royalty, M.D.  Electronically Signed     JAM/MEDQ  D:  03/22/2007  T:  03/22/2007  Job:  [id number]

## 2011-01-18 LAB — CBC
HCT: 31.6 — ABNORMAL LOW
Hemoglobin: 10.9 — ABNORMAL LOW
MCHC: 34.4
MCHC: 34.5
MCV: 83.8
RBC: 3.96
RDW: 13.7
WBC: 9.4

## 2012-01-05 ENCOUNTER — Ambulatory Visit (INDEPENDENT_AMBULATORY_CARE_PROVIDER_SITE_OTHER): Payer: Managed Care, Other (non HMO) | Admitting: Family Medicine

## 2012-01-05 ENCOUNTER — Encounter: Payer: Self-pay | Admitting: Family Medicine

## 2012-01-05 VITALS — BP 140/100 | HR 72 | Ht 68.0 in | Wt 316.0 lb

## 2012-01-05 DIAGNOSIS — B373 Candidiasis of vulva and vagina: Secondary | ICD-10-CM

## 2012-01-05 DIAGNOSIS — L738 Other specified follicular disorders: Secondary | ICD-10-CM

## 2012-01-05 DIAGNOSIS — Z6841 Body Mass Index (BMI) 40.0 and over, adult: Secondary | ICD-10-CM

## 2012-01-05 DIAGNOSIS — L739 Follicular disorder, unspecified: Secondary | ICD-10-CM

## 2012-01-05 LAB — POCT WET PREP (WET MOUNT)

## 2012-01-05 MED ORDER — FLUCONAZOLE 150 MG PO TABS: 150.0000 mg | ORAL_TABLET | Freq: Once | ORAL | Status: DC

## 2012-01-05 MED ORDER — DOXYCYCLINE HYCLATE 100 MG PO TABS: 100.0000 mg | ORAL_TABLET | Freq: Two times a day (BID) | ORAL | Status: DC

## 2012-01-05 NOTE — Patient Instructions (Addendum)
Take doxycycline twice daily for 10 days.  Do NOT complete the clindamycin. Take diflucan today, and repeat it in a week--this is to treat the yeast seen today, as well as any yeast infection that the antibiotic would cause. The oral antibiotic is to help with the skin infection (boils/ingrown hair).  Stop drinking regular sodas (will help with sugar and weight). Monitor BP elsewhere.  Follow a low sodium diet. Try and get at least 30 minutes of exercise daily, and lose weight.

## 2012-01-05 NOTE — Progress Notes (Signed)
Chief Complaint  Patient presents with  . Rash    has rash/bumps that are itchy that are in the genital area-had Mirena and wonders if this could have something to do with it. Pt declines flu vaccine.   HPI: Started with bumps in vaginal area back in January.  There are 3 bumps in the top area.  These are itchy; not painful or draining.  Saw MD at [facility] in March where she was given creams and antibiotics (doesn't recall the name, a pill taken every other week x 2 pills) and symptoms resolved, but only for a month.  Itching then recurred, and went back to same doctor in July-- had negative STD check, pap smear.  She was referred to GYN, diagnosed with folliculitis and rx'd oral cleocin 12/22/11.  She has 4 capsules left (dispensed #21).  When she first started the med she had "bad chest pain", but she continued taking the medication, and no further chest pain.  No significant diarrhea (just her typical IBS). It seemed to help at first, but itching is still there, and has ongoing discomfort. Didn't finish the full week of medication.  Having some irregular spotting from Mirena.  Had some burning/irritation when she had spotting.  Had one day with large amount of yellow discharge, but that hasn't persisted.  Denies any odor.  Main concern is ongoing itching.  Past Medical History  Diagnosis Date  . Prediabetes     told "borderline"  . Obesity    History reviewed. No pertinent past surgical history.  History   Social History  . Marital Status: Single    Spouse Name: N/A    Number of Children: 2  . Years of Education: N/A   Occupational History  . floral [employer]   Social History Main Topics  . Smoking status: Former Smoker    Types: Cigarettes    Quit date: 12/11/2010  . Smokeless tobacco: Never Used  . Alcohol Use: No  . Drug Use: No  . Sexually Active: Not Currently   Other Topics Concern  . Not on file   Social History Narrative   Lives with son and  daughter; husband left 2 years ago. Mother is Tally Due   Family History  Problem Relation Age of Onset  . Hypothyroidism Mother   . Diabetes Mother   . Diabetes Maternal Grandmother    Current outpatient prescriptions:levonorgestrel (MIRENA) 20 MCG/24HR IUD, 1 each by Intrauterine route once., Disp: , Rfl: ;  clindamycin (CLEOCIN) 300 MG capsule, Take 300 mg by mouth 3 (three) times daily., Disp: , Rfl:   Allergies  Allergen Reactions  . Sulfa Antibiotics    ROS: Denies chest pain, palpitations, nausea, vomiting, change in bowels, shortness of breath, headaches, dizziness. Occasional swelling behind her ears that lasts for 3 weeks; occurs intermittently, self-resolves.no pain/swelling currently Denies fevers, but has had some URI symptoms Denies h/o high blood pressure Told she has borderline DM.  Drinks a large amount of [employer]  PHYSICAL EXAM: BP 140/100  Pulse 72  Ht 5\' 8"  (1.727 m)  Wt 316 lb (143.337 kg)  BMI 48.05 kg/m2 124/84 on repeat by MD, RA Morbidly obese, pleasant female, mildly anxious, in no distress Neck: no lymphadenopathy or mass Heart: regular rate and rhythm without murmur Lungs: clear bilaterally Abdomen: obese, nontender GU:  External genitalia: follicular inflammation in a few place, some actively inflamed, red, tender, others healing.  No purulent drainage.  Area anteriorly that she feels is most  symptomatic of itching (and where she feels "bumps") appears normal, with no evidence of rash. Speculum exam revealed some thick white discharge.  Cervix normal without lesions.  KOH/wet prep--no clue cells, rare WBC. Few bacteria.  + yeast/hyphae  ASSESSMENT/PLAN:  1. Folliculitis  doxycycline (VIBRA-TABS) 100 MG tablet  2. Yeast vaginitis  fluconazole (DIFLUCAN) 150 MG tablet, POCT Wet Prep ([place]et [place])  3. Morbid obesity with BMI of 45.0-49.9, adult      Stop drinking regular sodas (will help with sugar and weight). Monitor BP elsewhere.  Follow a  low sodium diet. Try and get at least 30 minutes of exercise daily, and lose weight.  F/u with GYN if having ongoing genital concerns. Risks and side effects of both rx'd medications were reviewed with patient

## 2014-02-10 ENCOUNTER — Encounter: Payer: Self-pay | Admitting: Family Medicine

## 2017-06-28 ENCOUNTER — Other Ambulatory Visit: Payer: Self-pay | Admitting: Family Medicine

## 2017-06-28 DIAGNOSIS — Z1231 Encounter for screening mammogram for malignant neoplasm of breast: Secondary | ICD-10-CM

## 2017-07-04 ENCOUNTER — Other Ambulatory Visit: Payer: Self-pay | Admitting: Family Medicine

## 2017-07-04 DIAGNOSIS — N6320 Unspecified lump in the left breast, unspecified quadrant: Secondary | ICD-10-CM

## 2017-08-23 ENCOUNTER — Other Ambulatory Visit: Payer: Self-pay | Admitting: Family Medicine

## 2017-08-23 DIAGNOSIS — R928 Other abnormal and inconclusive findings on diagnostic imaging of breast: Secondary | ICD-10-CM

## 2017-08-24 ENCOUNTER — Ambulatory Visit
Admission: RE | Admit: 2017-08-24 | Discharge: 2017-08-24 | Disposition: A | Discharge status: Home / Self Care | Payer: Managed Care, Other (non HMO) | Source: Ambulatory Visit | Attending: Family Medicine | Admitting: Family Medicine

## 2017-08-24 DIAGNOSIS — R928 Other abnormal and inconclusive findings on diagnostic imaging of breast: Secondary | ICD-10-CM

## 2018-05-12 HISTORY — PX: BREAST CYST EXCISION: SHX579

## 2018-05-19 ENCOUNTER — Other Ambulatory Visit: Payer: Self-pay

## 2018-05-19 ENCOUNTER — Emergency Department (HOSPITAL_COMMUNITY)
Admission: EM | Admit: 2018-05-19 | Discharge: 2018-05-19 | Disposition: A | Discharge status: Home / Self Care | Payer: Managed Care, Other (non HMO) | Attending: Emergency Medicine | Admitting: Emergency Medicine

## 2018-05-19 ENCOUNTER — Encounter (HOSPITAL_COMMUNITY): Payer: Self-pay | Admitting: Emergency Medicine

## 2018-05-19 DIAGNOSIS — Z87891 Personal history of nicotine dependence: Secondary | ICD-10-CM | POA: Insufficient documentation

## 2018-05-19 DIAGNOSIS — R739 Hyperglycemia, unspecified: Secondary | ICD-10-CM | POA: Diagnosis not present

## 2018-05-19 DIAGNOSIS — L03113 Cellulitis of right upper limb: Secondary | ICD-10-CM

## 2018-05-19 DIAGNOSIS — L02411 Cutaneous abscess of right axilla: Secondary | ICD-10-CM

## 2018-05-19 DIAGNOSIS — Z79899 Other long term (current) drug therapy: Secondary | ICD-10-CM | POA: Diagnosis not present

## 2018-05-19 LAB — CBC WITH DIFFERENTIAL/PLATELET
Abs Immature Granulocytes: 0.05 10*3/uL (ref 0.00–0.07)
BASOS ABS: 0 10*3/uL (ref 0.0–0.1)
BASOS PCT: 0 %
Eosinophils Absolute: 0 10*3/uL (ref 0.0–0.5)
Eosinophils Relative: 0 %
HCT: 34.4 % — ABNORMAL LOW (ref 36.0–46.0)
Hemoglobin: 11.2 g/dL — ABNORMAL LOW (ref 12.0–15.0)
IMMATURE GRANULOCYTES: 1 %
Lymphocytes Relative: 11 %
Lymphs Abs: 0.8 10*3/uL (ref 0.7–4.0)
MCH: 27.5 pg (ref 26.0–34.0)
MCHC: 32.6 g/dL (ref 30.0–36.0)
MCV: 84.5 fL (ref 80.0–100.0)
Monocytes Absolute: 0.7 10*3/uL (ref 0.1–1.0)
Monocytes Relative: 8 %
NRBC: 0 % (ref 0.0–0.2)
Neutro Abs: 6.2 10*3/uL (ref 1.7–7.7)
Neutrophils Relative %: 80 %
PLATELETS: 216 10*3/uL (ref 150–400)
RBC: 4.07 MIL/uL (ref 3.87–5.11)
RDW: 11.9 % (ref 11.5–15.5)
WBC: 7.7 10*3/uL (ref 4.0–10.5)

## 2018-05-19 LAB — BASIC METABOLIC PANEL
ANION GAP: 17 — AB (ref 5–15)
BUN: 5 mg/dL — ABNORMAL LOW (ref 6–20)
CALCIUM: 8.5 mg/dL — AB (ref 8.9–10.3)
CO2: 20 mmol/L — ABNORMAL LOW (ref 22–32)
Chloride: 96 mmol/L — ABNORMAL LOW (ref 98–111)
Creatinine, Ser: 0.44 mg/dL (ref 0.44–1.00)
Glucose, Bld: 283 mg/dL — ABNORMAL HIGH (ref 70–99)
Potassium: 4.4 mmol/L (ref 3.5–5.1)
Sodium: 133 mmol/L — ABNORMAL LOW (ref 135–145)

## 2018-05-19 LAB — LACTIC ACID, PLASMA: Lactic Acid, Venous: 1.4 mmol/L (ref 0.5–1.9)

## 2018-05-19 LAB — CBG MONITORING, ED: Glucose-Capillary: 236 mg/dL — ABNORMAL HIGH (ref 70–99)

## 2018-05-19 MED ORDER — LIDOCAINE-EPINEPHRINE (PF) 2 %-1:200000 IJ SOLN
20.0000 mL | Freq: Once | INTRAMUSCULAR | Status: AC
  Administered 2018-05-19: 20 mL
  Filled 2018-05-19: qty 20

## 2018-05-19 MED ORDER — ONDANSETRON HCL 4 MG/2ML IJ SOLN
4.0000 mg | Freq: Once | INTRAMUSCULAR | Status: AC
  Administered 2018-05-19: 4 mg via INTRAVENOUS
  Filled 2018-05-19: qty 2

## 2018-05-19 MED ORDER — VANCOMYCIN HCL 10 G IV SOLR
2000.0000 mg | INTRAVENOUS | Status: AC
  Administered 2018-05-19: 2000 mg via INTRAVENOUS
  Filled 2018-05-19: qty 2000

## 2018-05-19 MED ORDER — VANCOMYCIN HCL IN DEXTROSE 1-5 GM/200ML-% IV SOLN
1000.0000 mg | Freq: Once | INTRAVENOUS | Status: DC
  Administered 2018-05-19: 1000 mg via INTRAVENOUS
  Filled 2018-05-19: qty 200

## 2018-05-19 MED ORDER — SODIUM CHLORIDE 0.9 % IV BOLUS
1000.0000 mL | Freq: Once | INTRAVENOUS | Status: AC
  Administered 2018-05-19: 1000 mL via INTRAVENOUS

## 2018-05-19 MED ORDER — HYDROMORPHONE HCL 1 MG/ML IJ SOLN
1.0000 mg | Freq: Once | INTRAMUSCULAR | Status: AC
  Administered 2018-05-19: 1 mg via INTRAVENOUS
  Filled 2018-05-19: qty 1

## 2018-05-19 MED ORDER — VANCOMYCIN HCL IN DEXTROSE 750-5 MG/150ML-% IV SOLN
750.0000 mg | Freq: Three times a day (TID) | INTRAVENOUS | Status: DC
  Filled 2018-05-19: qty 150

## 2018-05-19 MED ORDER — OXYCODONE HCL 5 MG PO TABS: 5.0000 mg | ORAL_TABLET | ORAL | 0 refills | Status: AC | PRN

## 2018-05-19 MED ORDER — MORPHINE SULFATE (PF) 4 MG/ML IV SOLN
4.0000 mg | Freq: Once | INTRAVENOUS | Status: AC
  Administered 2018-05-19: 4 mg via INTRAVENOUS
  Filled 2018-05-19: qty 1

## 2018-05-19 MED ORDER — FENTANYL CITRATE (PF) 100 MCG/2ML IJ SOLN
100.0000 ug | Freq: Once | INTRAMUSCULAR | Status: AC
  Administered 2018-05-19: 100 ug via INTRAVENOUS
  Filled 2018-05-19: qty 2

## 2018-05-19 MED ORDER — CLINDAMYCIN HCL 150 MG PO CAPS: 450.0000 mg | ORAL_CAPSULE | Freq: Three times a day (TID) | ORAL | 0 refills | Status: AC

## 2018-05-19 NOTE — ED Notes (Signed)
Patient verbalizes understanding of discharge instructions. Opportunity for questioning and answers were provided. Armband removed by staff, pt discharged from ED.  

## 2018-05-19 NOTE — Discharge Instructions (Addendum)
You have two abscesses with cellulitis.  These were incised and drained. You were given antibiotics through IV here.  Labs were reassuring.  Take clindamycin as prescribed. Acetaminophen [phone] mg every 6 hours for inflammation, pain, swelling.  Oxycodone 5 mg for break through pain every 4 hours.  Apply heat to the area (heating pad) to help localize infection.   Resume insulin regimen.  Follow up with your primary care doctor in 48-72 hours for wound check.  Return to the ER for fever greater than 100.4 F, worsening redness despite antibiotics, persistently elevated glucose levels, nausea, vomiting, abdominal pain.

## 2018-05-19 NOTE — ED Provider Notes (Addendum)
[facility] EMERGENCY DEPARTMENT Provider Note   CSN: [id number] Arrival date & time: 05/19/18  1228     History   Chief Complaint Chief Complaint  Patient presents with  . Cyst    HPI Lori Schmidt is a 43 y.o. female w/ ho diabetes on insulin, obesity, here for evaluation of cyst to right axillar onset 1 week ago. Associated with increased swelling, redness, warmth, subjective fevers, chills. Went to PCP on Tuesday and was rx keflex but no I&D performed.  No alleviating factors. Worse with movement of arm and palpation.  Has not used her insulin in close to 1 week because she didn't know if she should with the antibiotics, also didn't want her blood sugar to drop while being sick.  Denies nausea, vomiting, abdominal pain, polyuria, polydipsia. Has had recurrent abscesses to groin and axillae, never needed incision and drainage.    HPI  Past Medical History:  Diagnosis Date  . Obesity   . Prediabetes    told "borderline"    Patient Active Problem List   Diagnosis Date Noted  . Morbid obesity with BMI of 45.0-49.9, adult (HCC) 01/05/2012    History reviewed. No pertinent surgical history.   OB History    Gravida  2   Para  2   Term      Preterm      AB      Living  2     SAB      TAB      Ectopic      Multiple      Live Births               Home Medications    Prior to Admission medications   Medication Sig Start Date End Date Taking? Authorizing Provider  cephALEXin (KEFLEX) 500 MG capsule Take 500 mg by mouth 3 (three) times daily. 05/15/18  Yes [provider]  citalopram (CELEXA) 20 MG tablet Take 20 mg by mouth daily. 03/15/18  Yes [provider]  insulin detemir (LEVEMIR) 100 UNIT/ML injection Inject 50 Units into the skin at bedtime.   Yes [provider]  JANUVIA 100 MG tablet Take 100 mg by mouth at bedtime. 03/13/18  Yes [provider]  medroxyPROGESTERone Acetate (DEPO-PROVERA  IM) Inject into the muscle every 3 (three) months.   Yes [provider]  metFORMIN (GLUCOPHAGE) 1000 MG tablet Take 1,000 mg by mouth at bedtime.  02/22/18  Yes [provider]  clindamycin (CLEOCIN) 150 MG capsule Take 3 capsules (450 mg total) by mouth 3 (three) times daily for 7 days. 05/19/18 05/26/18  Liberty Handy, PA-C  doxycycline (VIBRA-TABS) 100 MG tablet Take 1 tablet (100 mg total) by mouth 2 (two) times daily. Patient not taking: Reported on 05/19/2018 01/05/12   Joselyn Arrow, MD  fluconazole (DIFLUCAN) 150 MG tablet Take 1 tablet (150 mg total) by mouth once. Patient not taking: Reported on 05/19/2018 01/05/12   Joselyn Arrow, MD  oxyCODONE (OXY IR/ROXICODONE) 5 MG immediate release tablet Take 1 tablet (5 mg total) by mouth every 4 (four) hours as needed for up to 3 days for severe pain. 05/19/18 05/22/18  Liberty Handy, PA-C    Family History Family History  Problem Relation Age of Onset  . Hypothyroidism Mother   . Diabetes Mother   . Diabetes Maternal Grandmother   . Breast cancer Neg Hx     Social History Social History   Tobacco Use  .  Smoking status: Former Smoker    Types: Cigarettes    Last attempt to quit: 12/11/2010    Years since quitting: 7.4  . Smokeless tobacco: Never Used  Substance Use Topics  . Alcohol use: No  . Drug use: No     Allergies   Sulfa antibiotics and Lisinopril   Review of Systems Review of Systems  Skin: Positive for color change.       Abscess   Allergic/Immunologic: Positive for immunocompromised state.  All other systems reviewed and are negative.    Physical Exam Updated Vital Signs BP 138/71   Pulse 96   Temp 99.8 F (37.7 C) (Oral)   Resp 18   Ht 5\' 7"  (1.702 m)   Wt 122.5 kg   SpO2 100%   BMI 42.29 kg/m   Physical Exam Vitals signs and nursing note reviewed.  Constitutional:      General: She is not in acute distress.    Appearance: She is well-developed. She is obese.     Comments: NAD.    HENT:     Head: Normocephalic and atraumatic.     Right Ear: External ear normal.     Left Ear: External ear normal.     Nose: Nose normal.  Eyes:     General: No scleral icterus.    Conjunctiva/sclera: Conjunctivae normal.  Neck:     Musculoskeletal: Normal range of motion and neck supple.  Cardiovascular:     Rate and Rhythm: Normal rate and regular rhythm.     Heart sounds: Normal heart sounds.  Pulmonary:     Effort: Pulmonary effort is normal.     Breath sounds: Normal breath sounds.  Musculoskeletal: Normal range of motion.  Skin:    General: Skin is warm and dry.     Capillary Refill: Capillary refill takes less than 2 seconds.     Findings: Abscess and erythema present.     Comments: Two abscesses to right axilla anteriorly and posteriorly in axillary crease.  Moderate induration, erythema surrounding abscesses.  Streaking of cellulitis onto anterior axillary crease, shoulder and upper breast. See photo.  One abscess is actively drainage.   Neurological:     Mental Status: She is alert and oriented to person, place, and time.  Psychiatric:        Behavior: Behavior normal.        Thought Content: Thought content normal.        Judgment: Judgment normal.          ED Treatments / Results  Labs (all labs ordered are listed, but only abnormal results are displayed) Labs Reviewed  CBC WITH DIFFERENTIAL/PLATELET - Abnormal; Notable for the following components:      Result Value   Hemoglobin 11.2 (*)    HCT 34.4 (*)    All other components within normal limits  BASIC METABOLIC PANEL - Abnormal; Notable for the following components:   Sodium 133 (*)    Chloride 96 (*)    CO2 20 (*)    Glucose, Bld 283 (*)    BUN 5 (*)    Calcium 8.5 (*)    Anion gap 17 (*)    All other components within normal limits  CBG MONITORING, ED - Abnormal; Notable for the following components:   Glucose-Capillary 236 (*)    All other components within normal limits  CULTURE, BLOOD  (ROUTINE X 2)  CULTURE, BLOOD (ROUTINE X 2)  LACTIC ACID, PLASMA    EKG None  Radiology  No results found.  Procedures .Marland Kitchen.Incision and Drainage Date/Time: 05/20/2018 2:30 PM Performed by: Liberty HandyGibbons, Claudia J, PA-C Authorized by: Liberty HandyGibbons, Claudia J, PA-C   Consent:    Consent obtained:  Verbal   Consent given by:  Patient   Risks discussed:  Bleeding, incomplete drainage, pain and damage to other organs   Alternatives discussed:  No treatment Universal protocol:    Procedure explained and questions answered to patient or proxy's satisfaction: yes     Relevant documents present and verified: yes     Required blood products, implants, devices, and special equipment available: yes     Site/side marked: yes     Immediately prior to procedure a time out was called: yes     Patient identity confirmed:  Verbally with patient Location:    Type:  Abscess   Location:  Upper extremity Pre-procedure details:    Skin preparation:  Chloraprep Anesthesia (see MAR for exact dosages):    Anesthesia method:  Local infiltration   Local anesthetic:  Lidocaine 2% WITH epi Procedure type:    Complexity:  Complex Procedure details:    Incision types:  Single straight   Incision depth:  Subcutaneous   Scalpel blade:  11   Wound management:  Probed and deloculated, irrigated with saline and extensive cleaning   Drainage:  Purulent   Drainage amount:  Copious   Packing materials:  1/4 in gauze Post-procedure details:    Patient tolerance of procedure:  Tolerated well, no immediate complications   (including critical care time)  Medications Ordered in ED Medications  sodium chloride 0.9 % bolus 1,000 mL (0 mLs Intravenous Stopped 05/19/18 1605)  lidocaine-EPINEPHrine (XYLOCAINE W/EPI) 2 %-1:200000 (PF) injection 20 mL (20 mLs Infiltration Given 05/19/18 1409)  fentaNYL (SUBLIMAZE) injection 100 mcg (100 mcg Intravenous Given 05/19/18 1408)  vancomycin (VANCOCIN) 2,000 mg in sodium chloride 0.9 %  500 mL IVPB (0 mg Intravenous Stopped 05/19/18 1734)  morphine 4 MG/ML injection 4 mg (4 mg Intravenous Given 05/19/18 1603)  ondansetron (ZOFRAN) injection 4 mg (4 mg Intravenous Given 05/19/18 1603)  HYDROmorphone (DILAUDID) injection 1 mg (1 mg Intravenous Given 05/19/18 1635)     Initial Impression / Assessment and Plan / ED Course  I have reviewed the triage vital signs and the nursing notes.  Pertinent labs & imaging results that were available during my care of the patient were reviewed by me and considered in my medical decision making (see chart for details).  Clinical Course as of May 20 1429  Sat May 19, 2018  1348 Glucose(!): 283 [CG]  1348 Anion gap(!): 17 [CG]  1348 CO2(!): 20 [CG]    Clinical Course User Index [CG] Liberty HandyGibbons, Claudia J, PA-C   History and exam consistent with worsening abscess with superimposed local cellulitis. 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  She is in pain but non toxic afebrile and normotensive.  Mildly tachycardic likely from hyperglycemia, dehydration, pain.    WBC and lactic acid normal, afebrile.  Hyperglycemia noted with slight anion gab and low bicarb in setting of insulin non compliance. Hyperglycemia improved in ER with IVF only.  She received vancomycin, pain control and I&D was performed with copious amounts of purulent drainage obtained from both abscesses.  No immediate I&D complications. I think patient is appropriate for discharge given lack of fever, normal WBC and lactic acid and adequate drainage of abscesses.  I anticipate improvement  with appropriate abx, high dose NSAID.  She is to f/u with PCP in 48-72 hours of antbiotics for wound check and packing removal, reassessment to determine if there is clinical decline.  Packing may need to be reinserted at check up.  Specific strict return precautions discussed with pt and mother. They are both comfortable with  this plan.  Discussed with EDMD.   Final Clinical Impressions(s) / ED Diagnoses   Final diagnoses:  Abscess of axilla, right  Cellulitis of right upper extremity  Hyperglycemia    ED Discharge Orders         Ordered    oxyCODONE (OXY IR/ROXICODONE) 5 MG immediate release tablet  Every 4 hours PRN     05/19/18 1502    clindamycin (CLEOCIN) 150 MG capsule  3 times daily     05/19/18 1520           Liberty Handy, [place] 05/20/18 1429    Liberty Handy, PA-C 05/20/18 1431    Jacalyn Lefevre, MD 05/23/18 [id number]

## 2018-05-19 NOTE — Progress Notes (Signed)
Pharmacy Antibiotic Note  Lori Schmidt is a 43 y.o. female admitted on 05/19/2018 with cellulitis.  Pharmacy has been consulted for Vancomycin dosing.  CC/HPI: R arm cyst. Worsening pain. Ruptured.  Significant events: Abx from primary care MD this past week-Keflex  ID: Cellulitis (s/p Keflex PTA). Temp 99.8. WBC 7.7. Keflex PTA Vanco 12/8>>  Vancomycin 750 mg IV Q 8 hrs. Goal AUC 400-550. Expected AUC: 512 SCr used: 0.8  Plan: Vancomycin 2g IV x 1, then 750mg  IV q 8 hrs    Height: 5\' 7"  (170.2 cm) Weight: 270 lb (122.5 kg) IBW/kg (Calculated) : 61.6  Temp (24hrs), Avg:99.8 F (37.7 C), Min:99.8 F (37.7 C), Max:99.8 F (37.7 C)  Recent Labs  Lab 05/19/18 1314  WBC 7.7  CREATININE 0.44  LATICACIDVEN 1.4    Estimated Creatinine Clearance: 124.4 mL/min (by C-G formula based on SCr of 0.44 mg/dL).    Allergies  Allergen Reactions  . Sulfa Antibiotics      Lori Schmidt S. Merilynn Finland, PharmD, BCPS Clinical Staff Pharmacist Misty Stanley Stillinger 05/19/2018 2:14 PM

## 2018-05-19 NOTE — ED Triage Notes (Signed)
Pt complains of right arm cyst.  She states that she went to her primary care doctor this past Tuesday where she got antibiotics and she come back today because the pain is getting worse. States it ruptured this past Thursday.  NAD noted at this time AOx4.

## 2018-05-24 LAB — CULTURE, BLOOD (ROUTINE X 2)
CULTURE: NO GROWTH
CULTURE: NO GROWTH
SPECIAL REQUESTS: ADEQUATE

## 2019-03-14 ENCOUNTER — Other Ambulatory Visit: Payer: Self-pay | Admitting: Family Medicine

## 2019-03-14 DIAGNOSIS — Z1231 Encounter for screening mammogram for malignant neoplasm of breast: Secondary | ICD-10-CM

## 2019-05-02 ENCOUNTER — Other Ambulatory Visit: Payer: Self-pay

## 2019-05-02 ENCOUNTER — Ambulatory Visit
Admission: RE | Admit: 2019-05-02 | Discharge: 2019-05-02 | Disposition: A | Discharge status: Home / Self Care | Payer: Managed Care, Other (non HMO) | Source: Ambulatory Visit | Attending: Family Medicine | Admitting: Family Medicine

## 2019-05-02 DIAGNOSIS — Z1231 Encounter for screening mammogram for malignant neoplasm of breast: Secondary | ICD-10-CM

## 2019-05-02 IMAGING — MG DIGITAL SCREENING BILAT W/ TOMO W/ CAD
6 of 10 series · 6 of 30 positions shown · non-contrast
Comparison: Previous exam(s).

CLINICAL DATA: Screening.

EXAM:
DIGITAL SCREENING BILATERAL MAMMOGRAM WITH TOMO AND CAD

[L CC synth-2D]
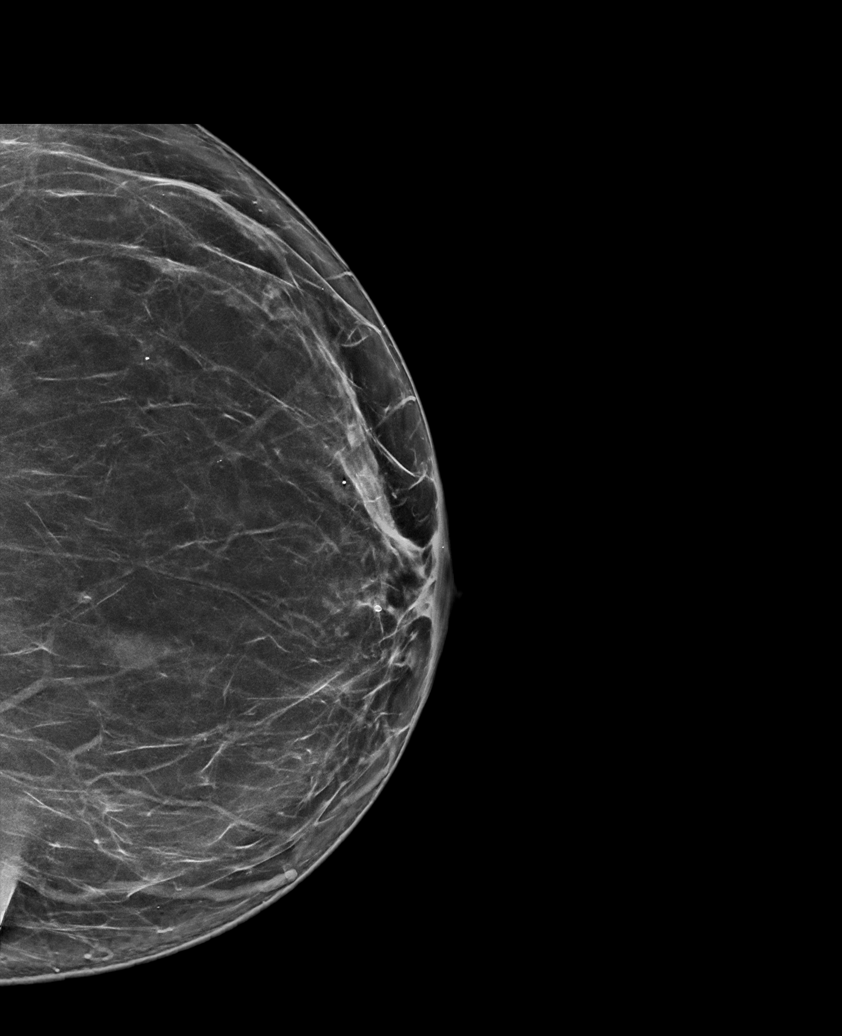

[R CC synth-2D]
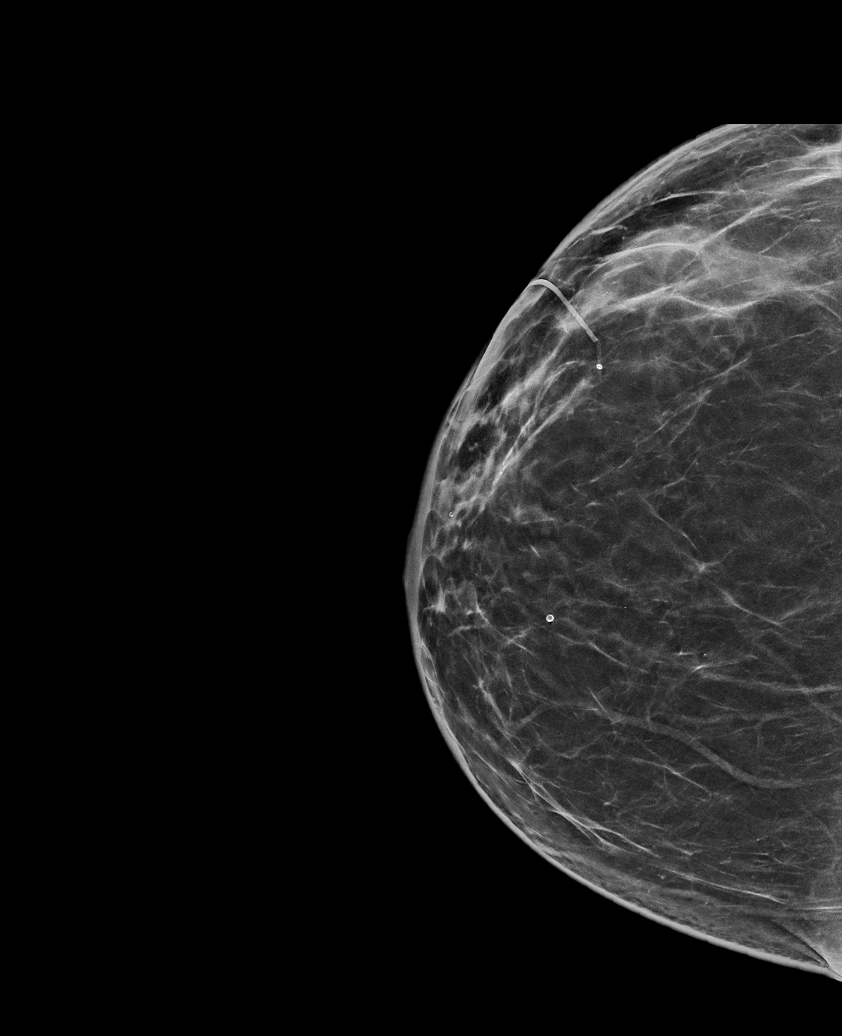

[R MLO synth-2D]
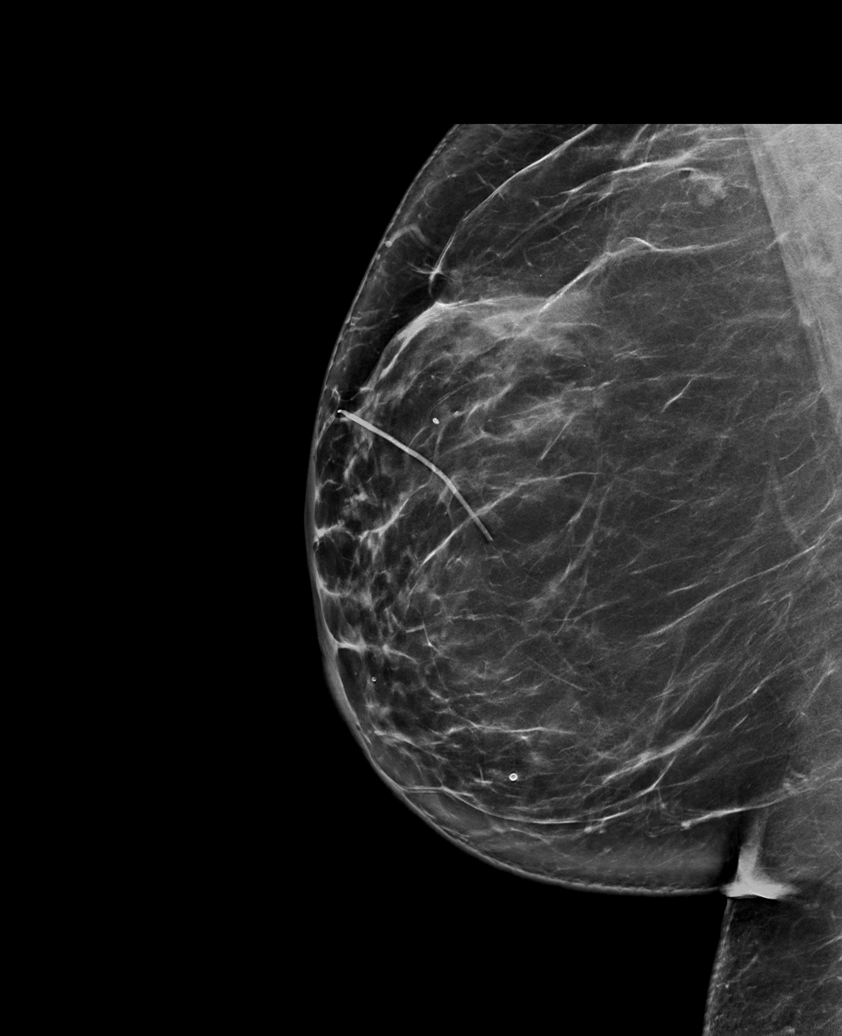

[L MLO synth-2D]
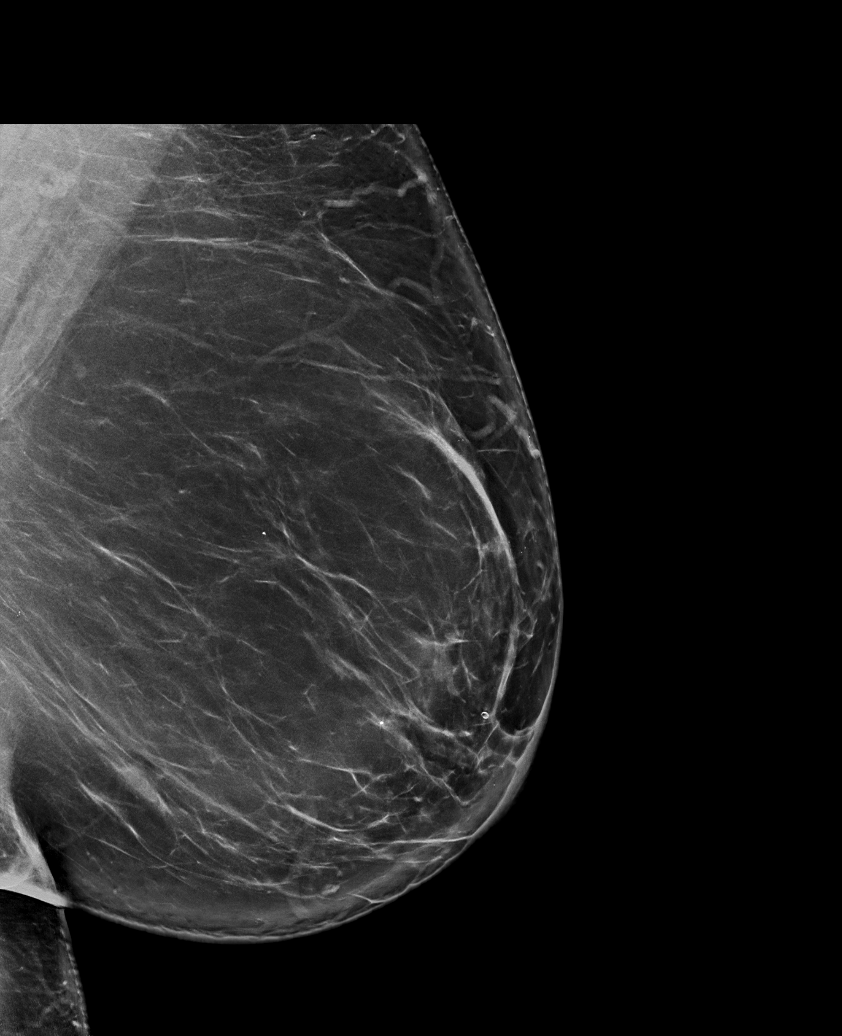

[L CV synth-2D]
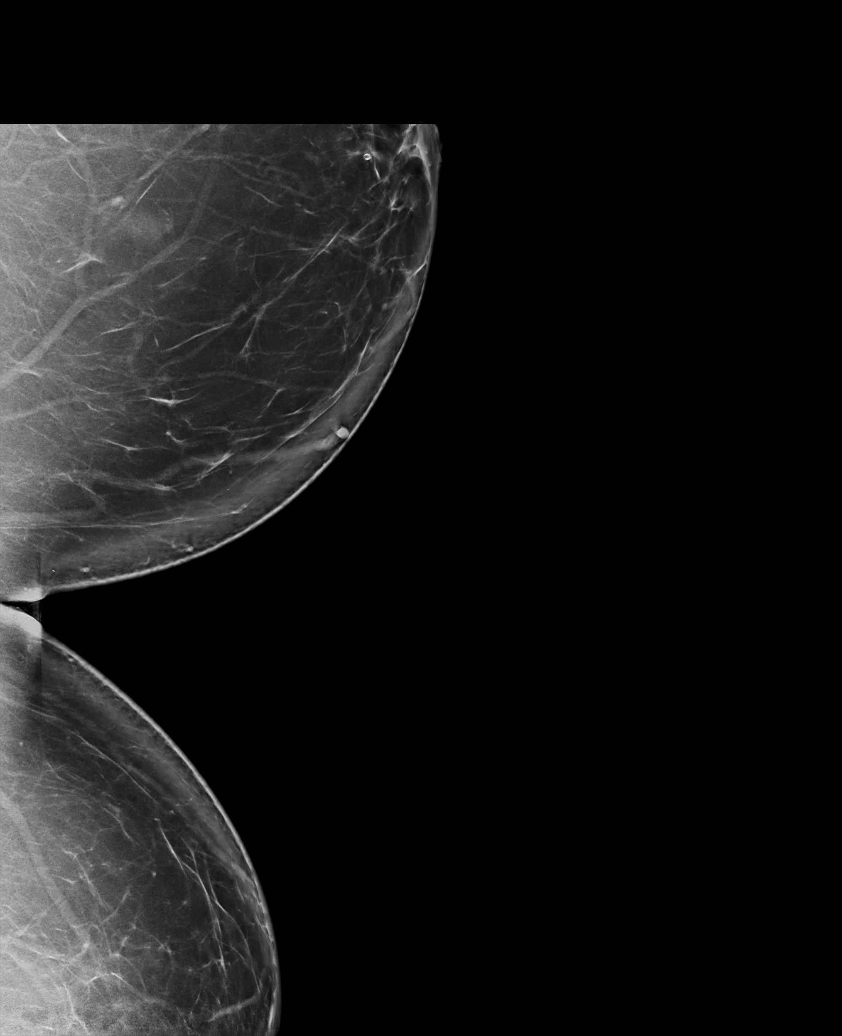

[L MLO tomo · tomo slice 52/103.0]
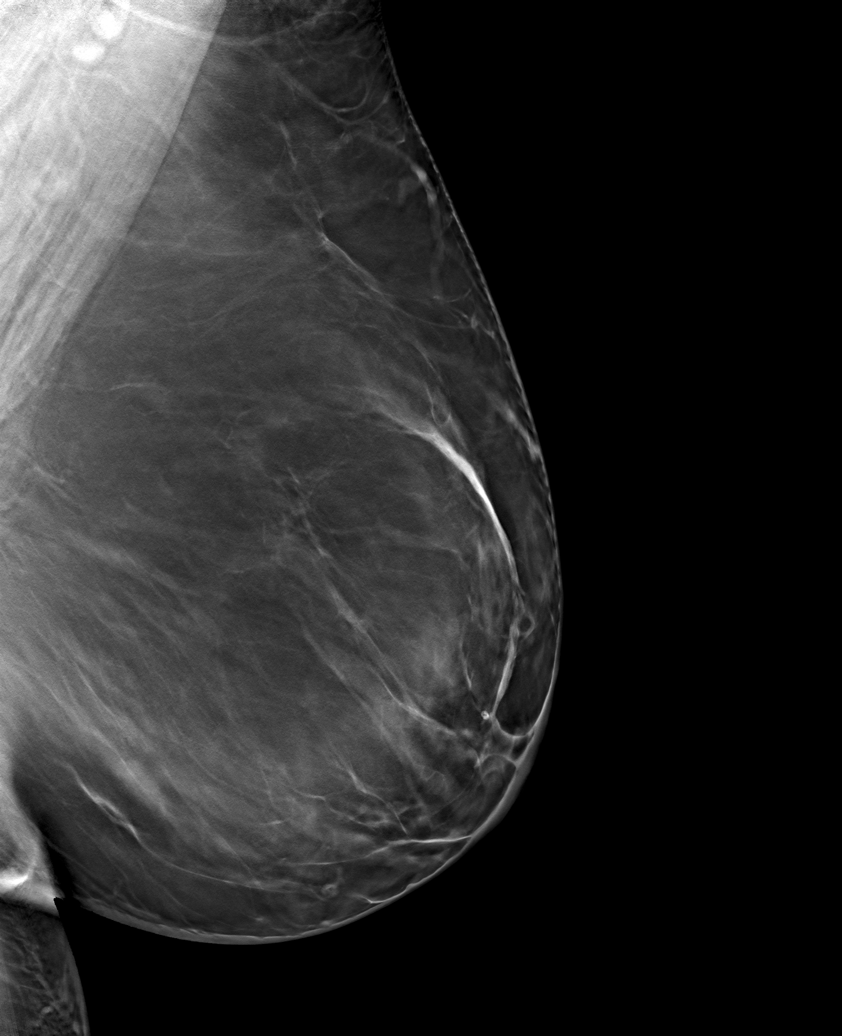

[6 of 30 positions shown; findings below may reference images not displayed]

ACR Breast Density Category b: There are scattered areas of
fibroglandular density.
FINDINGS: There are no findings suspicious for malignancy. Images were
processed with CAD.
IMPRESSION: No mammographic evidence of malignancy. A result letter of this
screening mammogram will be mailed directly to the patient.

RECOMMENDATION:
Screening mammogram in one year. (Code:[TQ])

BI-RADS CATEGORY  1: Negative.

## 2020-05-07 ENCOUNTER — Other Ambulatory Visit: Payer: Self-pay | Admitting: Family Medicine

## 2020-05-07 DIAGNOSIS — Z0000 Encounter for general adult medical examination without abnormal findings: Secondary | ICD-10-CM

## 2020-06-25 ENCOUNTER — Inpatient Hospital Stay: Admission: RE | Admit: 2020-06-25 | Payer: Managed Care, Other (non HMO) | Source: Ambulatory Visit

## 2020-09-08 ENCOUNTER — Ambulatory Visit
Admission: RE | Admit: 2020-09-08 | Discharge: 2020-09-08 | Disposition: A | Discharge status: Home / Self Care | Payer: Managed Care, Other (non HMO) | Source: Ambulatory Visit | Attending: Family Medicine | Admitting: Family Medicine

## 2020-09-08 ENCOUNTER — Other Ambulatory Visit: Payer: Self-pay

## 2020-09-08 DIAGNOSIS — Z0000 Encounter for general adult medical examination without abnormal findings: Secondary | ICD-10-CM

## 2020-09-08 IMAGING — MG MM DIGITAL SCREENING BILAT W/ TOMO AND CAD
6 of 12 series · 6 of 36 positions shown · non-contrast
Comparison: Previous exam(s).

CLINICAL DATA: Screening.

EXAM:
DIGITAL SCREENING BILATERAL MAMMOGRAM WITH TOMOSYNTHESIS AND CAD
TECHNIQUE: Bilateral screening digital craniocaudal and mediolateral oblique
mammograms were obtained. Bilateral screening digital breast
tomosynthesis was performed. The images were evaluated with
computer-aided detection.

[R MLO synth-2D (1 of 2)]
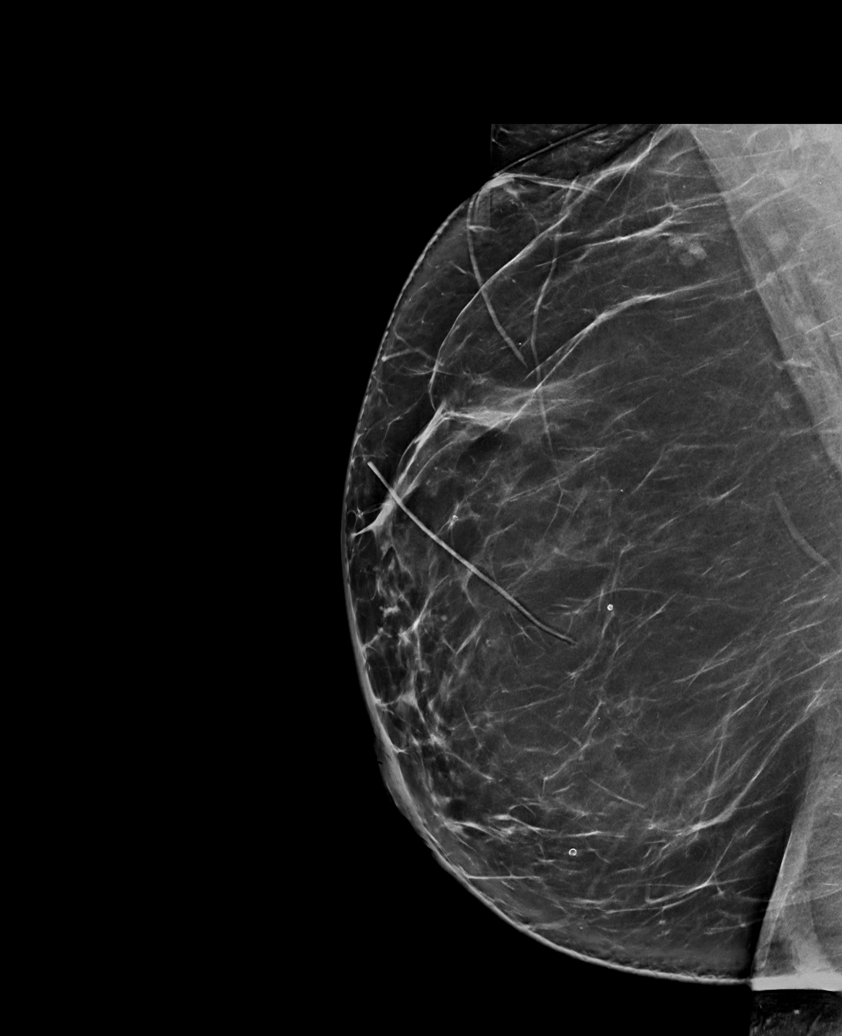

[L MLO synth-2D (1 of 2)]
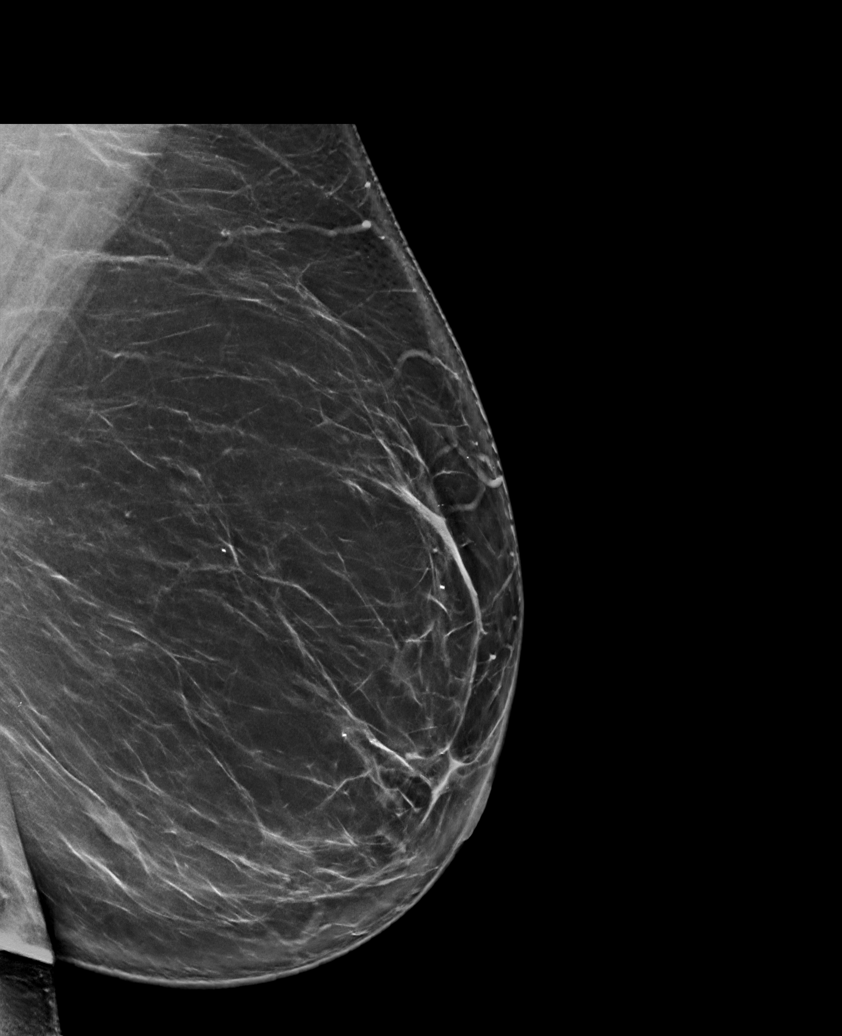

[L CC synth-2D]
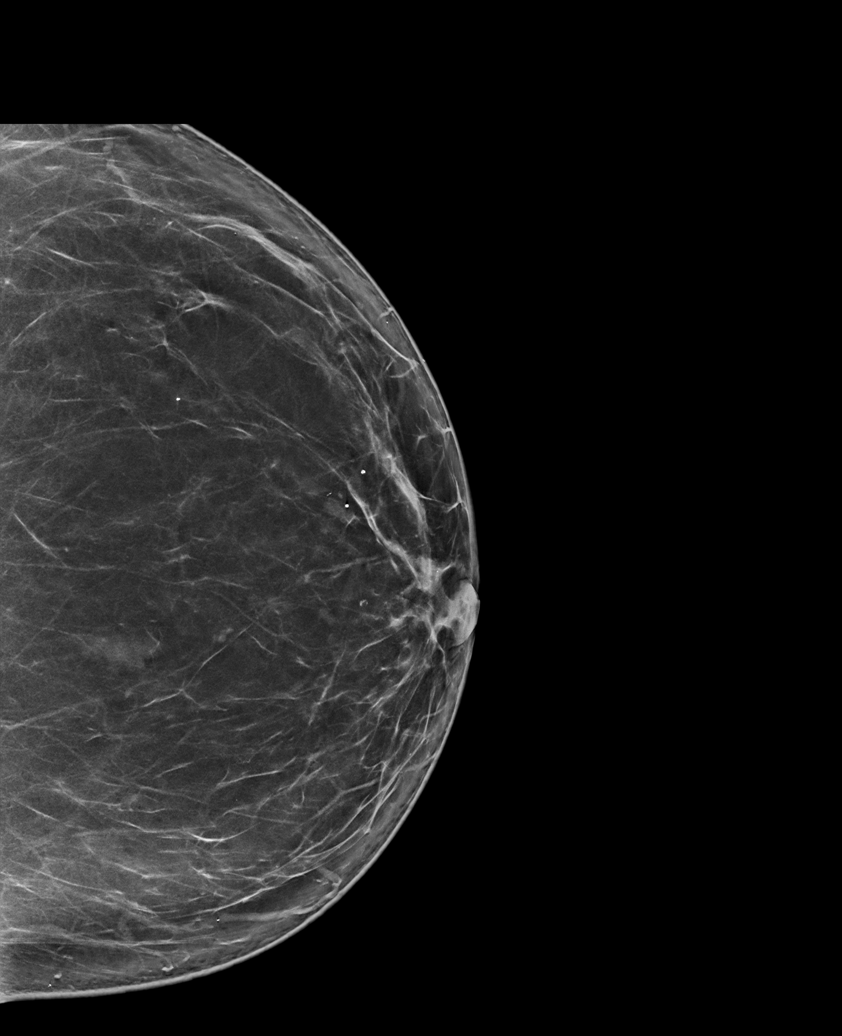

[R MLO synth-2D (2 of 2)]
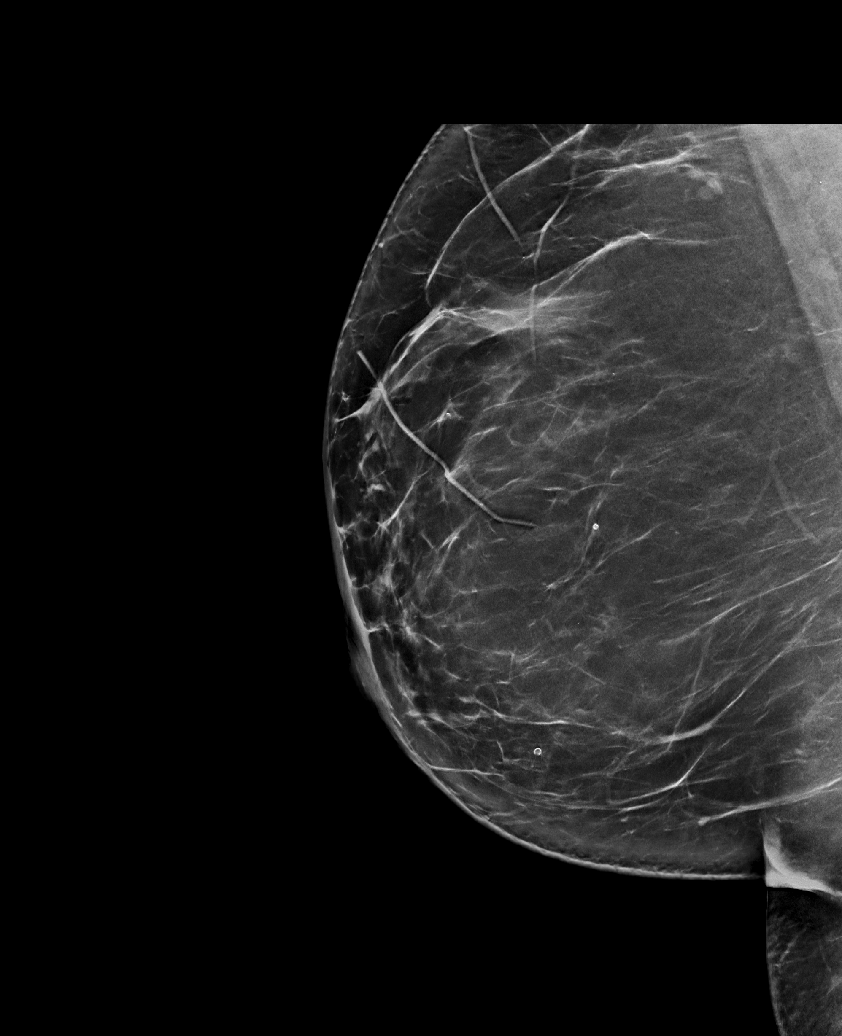

[R CC synth-2D]
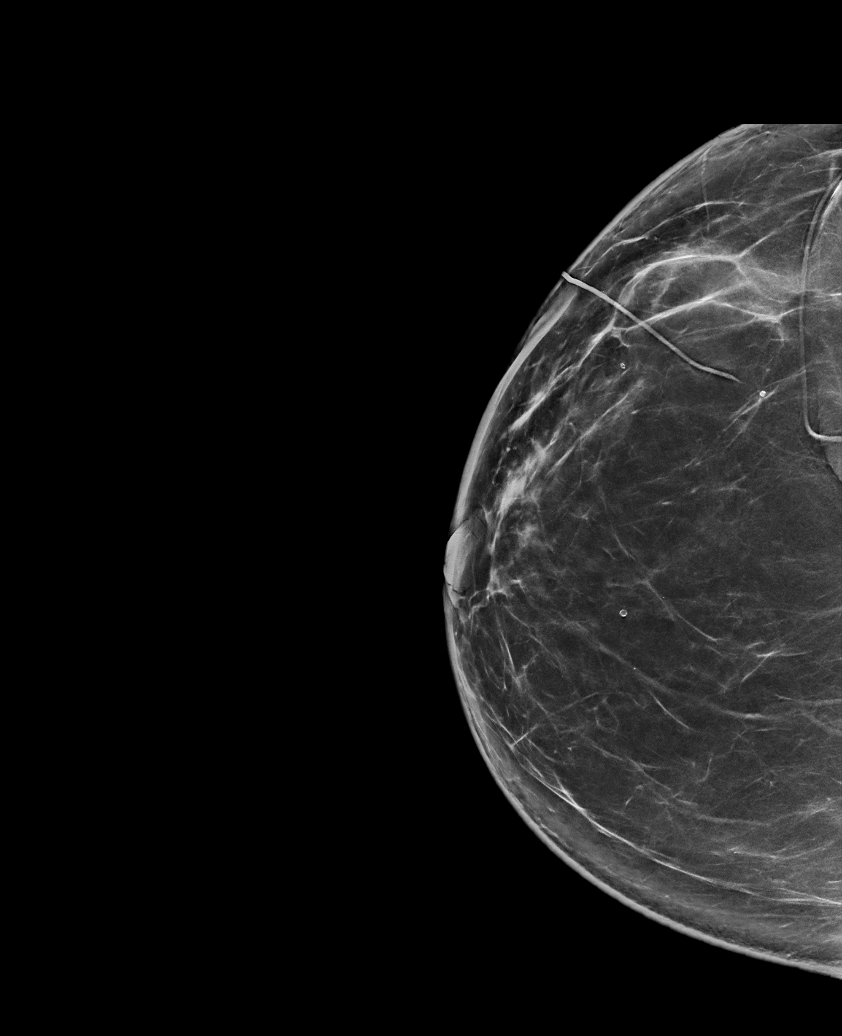

[L MLO synth-2D (2 of 2)]
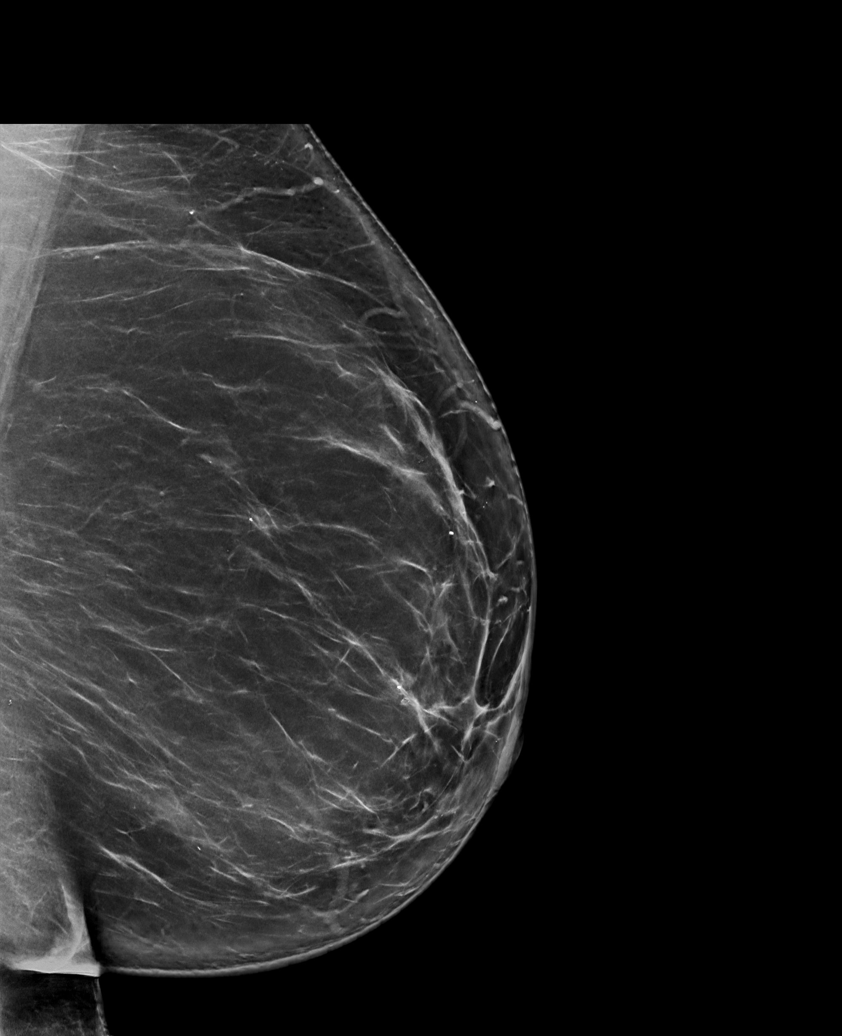

[6 of 36 positions shown; findings below may reference images not displayed]

ACR Breast Density Category b: There are scattered areas of
fibroglandular density.
FINDINGS: There are no findings suspicious for malignancy. The images were
evaluated with computer-aided detection.
IMPRESSION: No mammographic evidence of malignancy. A result letter of this
screening mammogram will be mailed directly to the patient.

RECOMMENDATION:
Screening mammogram in one year. (Code:[OD])

BI-RADS CATEGORY  1: Negative.

## 2020-09-10 ENCOUNTER — Ambulatory Visit: Payer: Managed Care, Other (non HMO)

## 2021-02-15 ENCOUNTER — Telehealth: Payer: Self-pay | Admitting: Family Medicine

## 2021-02-15 NOTE — Telephone Encounter (Signed)
This is Woodfin Ganja daughter. Bonita Quin called and wanted to see if you could recommend a Dr for her or if you would be willing to take her on as a new pt. She said her daughter went to [place] on Friday for a swollen foot, Vomiting and pain in the calf and the person she saw just told her to go to the ER to have an MRI done. She said her daughter hasn't eaten in 4 days because she cant keep anything down. They are concerned that she may have a blood clot or something because her foot looks like a football. She said Charisse is a diabetic also. Bonita Quin can be reached at [phone]

## 2021-02-15 NOTE — Telephone Encounter (Signed)
It does sound like she needs to be seen urgently.  Does this mean she didn't go to the ER?  Concern would be either for blood clot, or for cellulitis (infection, given that she has diabetes). She needs to be seen today. If she is that sick, the ER is an appropriate place to get evaluated (as she may need IV fluid, labs, and possibly imaging). Please advise.

## 2021-02-15 NOTE — Telephone Encounter (Signed)
I hope you advised her to go to the ER based on prior message.  Not taking new patients at this time, and no appointments available to see her urgently even if we were (all providers booked up)

## 2021-02-15 NOTE — Telephone Encounter (Signed)
No Lori Schmidt said they did not go to the ER and they were upset with her pcp and will be looking for another one at this point.

## 2021-02-15 NOTE — Telephone Encounter (Signed)
Yes I just spoke to her and advised her to go to the ER. She is aware that you are not taking any new pts.

## 2021-02-16 ENCOUNTER — Other Ambulatory Visit: Payer: Self-pay

## 2021-02-16 ENCOUNTER — Emergency Department (HOSPITAL_BASED_OUTPATIENT_CLINIC_OR_DEPARTMENT_OTHER)
Admit: 2021-02-16 | Discharge: 2021-02-16 | Disposition: A | Discharge status: Home / Self Care | Payer: Managed Care, Other (non HMO)

## 2021-02-16 ENCOUNTER — Emergency Department (HOSPITAL_COMMUNITY)
Admission: EM | Admit: 2021-02-16 | Discharge: 2021-02-16 | Disposition: A | Discharge status: Home / Self Care | Payer: Managed Care, Other (non HMO) | Source: Home / Self Care | Attending: Emergency Medicine | Admitting: Emergency Medicine

## 2021-02-16 ENCOUNTER — Encounter (HOSPITAL_COMMUNITY): Payer: Self-pay | Admitting: Emergency Medicine

## 2021-02-16 DIAGNOSIS — R7881 Bacteremia: Secondary | ICD-10-CM | POA: Diagnosis not present

## 2021-02-16 DIAGNOSIS — E1165 Type 2 diabetes mellitus with hyperglycemia: Secondary | ICD-10-CM | POA: Insufficient documentation

## 2021-02-16 DIAGNOSIS — E1169 Type 2 diabetes mellitus with other specified complication: Secondary | ICD-10-CM | POA: Diagnosis not present

## 2021-02-16 DIAGNOSIS — Z7984 Long term (current) use of oral hypoglycemic drugs: Secondary | ICD-10-CM | POA: Insufficient documentation

## 2021-02-16 DIAGNOSIS — Z87891 Personal history of nicotine dependence: Secondary | ICD-10-CM | POA: Insufficient documentation

## 2021-02-16 DIAGNOSIS — Z794 Long term (current) use of insulin: Secondary | ICD-10-CM | POA: Insufficient documentation

## 2021-02-16 DIAGNOSIS — Z20822 Contact with and (suspected) exposure to covid-19: Secondary | ICD-10-CM | POA: Insufficient documentation

## 2021-02-16 DIAGNOSIS — M7989 Other specified soft tissue disorders: Secondary | ICD-10-CM | POA: Diagnosis not present

## 2021-02-16 DIAGNOSIS — R739 Hyperglycemia, unspecified: Secondary | ICD-10-CM

## 2021-02-16 DIAGNOSIS — R000 Tachycardia, unspecified: Secondary | ICD-10-CM | POA: Insufficient documentation

## 2021-02-16 DIAGNOSIS — L03116 Cellulitis of left lower limb: Secondary | ICD-10-CM

## 2021-02-16 LAB — COMPREHENSIVE METABOLIC PANEL
ALT: 27 U/L (ref 0–44)
AST: 27 U/L (ref 15–41)
Albumin: 2.4 g/dL — ABNORMAL LOW (ref 3.5–5.0)
Alkaline Phosphatase: 113 U/L (ref 38–126)
Anion gap: 17 — ABNORMAL HIGH (ref 5–15)
BUN: 12 mg/dL (ref 6–20)
CO2: 21 mmol/L — ABNORMAL LOW (ref 22–32)
Calcium: 8.8 mg/dL — ABNORMAL LOW (ref 8.9–10.3)
Chloride: 89 mmol/L — ABNORMAL LOW (ref 98–111)
Creatinine, Ser: 0.71 mg/dL (ref 0.44–1.00)
GFR, Estimated: >60 mL/min (ref >60)
Glucose, Bld: 341 mg/dL — ABNORMAL HIGH (ref 70–99)
Potassium: 3 mmol/L — ABNORMAL LOW (ref 3.5–5.1)
Sodium: 127 mmol/L — ABNORMAL LOW (ref 135–145)
Total Bilirubin: 1.4 mg/dL — ABNORMAL HIGH (ref 0.3–1.2)
Total Protein: 7 g/dL (ref 6.5–8.1)

## 2021-02-16 LAB — CBC WITH DIFFERENTIAL/PLATELET
Abs Immature Granulocytes: 0 10*3/uL (ref 0.00–0.07)
Basophils Absolute: 0 10*3/uL (ref 0.0–0.1)
Basophils Relative: 0 %
Eosinophils Absolute: 0 10*3/uL (ref 0.0–0.5)
Eosinophils Relative: 0 %
HCT: 34.7 % — ABNORMAL LOW (ref 36.0–46.0)
Hemoglobin: 11.9 g/dL — ABNORMAL LOW (ref 12.0–15.0)
Lymphocytes Relative: 4 %
Lymphs Abs: 0.3 10*3/uL — ABNORMAL LOW (ref 0.7–4.0)
MCH: 28.4 pg (ref 26.0–34.0)
MCHC: 34.3 g/dL (ref 30.0–36.0)
MCV: 82.8 fL (ref 80.0–100.0)
Monocytes Absolute: 0.5 10*3/uL (ref 0.1–1.0)
Monocytes Relative: 7 %
Neutro Abs: 6.3 10*3/uL (ref 1.7–7.7)
Neutrophils Relative %: 89 %
Platelets: 174 10*3/uL (ref 150–400)
RBC: 4.19 MIL/uL (ref 3.87–5.11)
RDW: 12.2 % (ref 11.5–15.5)
WBC: 7.1 10*3/uL (ref 4.0–10.5)
nRBC: 0 % (ref 0.0–0.2)
nRBC: 1 /100 WBC — ABNORMAL HIGH

## 2021-02-16 LAB — CBG MONITORING, ED: Glucose-Capillary: 328 mg/dL — ABNORMAL HIGH (ref 70–99)

## 2021-02-16 LAB — RESP PANEL BY RT-PCR (FLU A&B, COVID) ARPGX2
Influenza A by PCR: NEGATIVE
Influenza B by PCR: NEGATIVE
SARS Coronavirus 2 by RT PCR: NEGATIVE

## 2021-02-16 LAB — LACTIC ACID, PLASMA: Lactic Acid, Venous: 1.2 mmol/L (ref 0.5–1.9)

## 2021-02-16 MED ORDER — SODIUM CHLORIDE 0.9 % IV BOLUS
1000.0000 mL | Freq: Once | INTRAVENOUS | Status: AC
  Administered 2021-02-16: 1000 mL via INTRAVENOUS

## 2021-02-16 NOTE — ED Provider Notes (Signed)
[facility] EMERGENCY DEPARTMENT Provider Note   CSN: [id number] Arrival date & time: 02/16/21  1704     History Chief Complaint  Patient presents with   Foot Swelling    Lori Schmidt is a 45 y.o. female.  Patient with history of diabetes, currently uncontrolled presents to the emergency department for evaluation of left lower extremity pain and swelling.  Patient states that about 1 month ago she developed some swelling in her left leg.  This worsened about 5 days ago.  She states increasing pain in her foot which has made it difficult for her to walk and she has been staying in the bed most of the time.  She states that she has been vomiting, but because her 68 year old daughter is unable to make her food that she will eat and she throws up because she does not have anything on her stomach.  She does state that when she drinks [facility] her nausea is improved.  However she has seen her PCP and most recently urgent care yesterday.  At urgent care yesterday she was given a shot of strong antibiotics and was started on Keflex.  She has only started taking this today.  She had labs done at that time which showed a sodium of 127.  She also has a hemoglobin A1c of 13%.  No history of blood clots.  Denies trauma.  She reports fever yesterday at urgent care, however I cannot see documentation of this.  The onset of this condition was acute. The course is constant. Aggravating factors: none. Alleviating factors: none.        Past Medical History:  Diagnosis Date   Obesity    Prediabetes    told "borderline"    Patient Active Problem List   Diagnosis Date Noted   Morbid obesity with BMI of 45.0-49.9, adult (HCC) 01/05/2012    Past Surgical History:  Procedure Laterality Date   BREAST CYST EXCISION Right 05/2018   four cysts removed on right breast/axilla area     OB History     Gravida  2   Para  2   Term      Preterm      AB      Living  2       SAB      IAB      Ectopic      Multiple      Live Births              Family History  Problem Relation Age of Onset   Hypothyroidism Mother    Diabetes Mother    Diabetes Maternal Grandmother    Breast cancer Neg Hx     Social History   Tobacco Use   Smoking status: Former    Types: Cigarettes    Quit date: 12/11/2010    Years since quitting: 10.1   Smokeless tobacco: Never  Substance Use Topics   Alcohol use: No   Drug use: No    Home Medications Prior to Admission medications   Medication Sig Start Date End Date Taking? Authorizing Provider  cephALEXin (KEFLEX) 500 MG capsule Take 500 mg by mouth 3 (three) times daily. 05/15/18   [provider]  citalopram (CELEXA) 20 MG tablet Take 20 mg by mouth daily. 03/15/18   [provider]  doxycycline (VIBRA-TABS) 100 MG tablet Take 1 tablet (100 mg total) by mouth 2 (two) times daily. Patient not taking: Reported on 05/19/2018 01/05/12  Rita Ohara, MD  fluconazole (DIFLUCAN) 150 MG tablet Take 1 tablet (150 mg total) by mouth once. Patient not taking: Reported on 05/19/2018 01/05/12   Rita Ohara, MD  insulin detemir (LEVEMIR) 100 UNIT/ML injection Inject 50 Units into the skin at bedtime.    [provider]  JANUVIA 100 MG tablet Take 100 mg by mouth at bedtime. 03/13/18   [provider]  medroxyPROGESTERone Acetate (DEPO-PROVERA IM) Inject into the muscle every 3 (three) months.    [provider]  metFORMIN (GLUCOPHAGE) 1000 MG tablet Take 1,000 mg by mouth at bedtime.  02/22/18   [provider]    Allergies    Sulfa antibiotics and Lisinopril  Review of Systems   Review of Systems  Constitutional:  Negative for chills and fever.  HENT:  Negative for rhinorrhea and sore throat.   Eyes:  Negative for redness.  Respiratory:  Negative for cough.   Cardiovascular:  Positive for leg swelling. Negative for chest pain.  Gastrointestinal:  Positive for nausea and  vomiting. Negative for abdominal pain and diarrhea.  Genitourinary:  Negative for dysuria, frequency, hematuria and urgency.  Musculoskeletal:  Negative for myalgias.  Skin:  Negative for rash.  Neurological:  Negative for headaches.   Physical Exam Updated Vital Signs BP 121/86   Pulse (!) 125   Temp 98.5 F (36.9 C) (Oral)   Resp (!) 22   Ht 5\' 7"  (1.702 m)   Wt 122.5 kg   SpO2 100%   BMI 42.29 kg/m   Physical Exam Vitals and nursing note reviewed.  Constitutional:      General: She is not in acute distress.    Appearance: She is well-developed.  HENT:     Head: Normocephalic and atraumatic.     Right Ear: External ear normal.     Left Ear: External ear normal.     Nose: Nose normal.  Eyes:     Conjunctiva/sclera: Conjunctivae normal.  Cardiovascular:     Rate and Rhythm: Regular rhythm. Tachycardia present.     Heart sounds: No murmur heard. Pulmonary:     Effort: No respiratory distress.     Breath sounds: No wheezing, rhonchi or rales.  Abdominal:     Palpations: Abdomen is soft.     Tenderness: There is no abdominal tenderness. There is no guarding or rebound.  Musculoskeletal:     Cervical back: Normal range of motion and neck supple.     Right lower leg: No edema.     Left lower leg: Edema present.     Comments: Left lower extremity is edematous starting at the proximal calf and distally.  At around the ankle to the foot, there is erythema, poorly demarcated, and warmth to touch.  Patient has pitting edema of the foot and ankle.  Overall symptoms are consistent with cellulitis.  Skin:    General: Skin is warm and dry.     Findings: No rash.  Neurological:     General: No focal deficit present.     Mental Status: She is alert. Mental status is at baseline.     Motor: No weakness.  Psychiatric:        Mood and Affect: Mood normal.    ED Results / Procedures / Treatments   Labs (all labs ordered are listed, but only abnormal results are displayed) Labs  Reviewed  COMPREHENSIVE METABOLIC PANEL - Abnormal; Notable for the following components:      Result Value   Sodium 127 (*)  Potassium 3.0 (*)    Chloride 89 (*)    CO2 21 (*)    Glucose, Bld 341 (*)    Calcium 8.8 (*)    Albumin 2.4 (*)    Total Bilirubin 1.4 (*)    Anion gap 17 (*)    All other components within normal limits  CBC WITH DIFFERENTIAL/PLATELET - Abnormal; Notable for the following components:   Hemoglobin 11.9 (*)    HCT 34.7 (*)    Lymphs Abs 0.3 (*)    nRBC 1 (*)    All other components within normal limits  CBG MONITORING, ED - Abnormal; Notable for the following components:   Glucose-Capillary 328 (*)    All other components within normal limits  RESP PANEL BY RT-PCR (FLU A&B, COVID) ARPGX2  CULTURE, BLOOD (ROUTINE X 2)  CULTURE, BLOOD (ROUTINE X 2)  LACTIC ACID, PLASMA    EKG None  Radiology VAS [place] LOWER EXTREMITY VENOUS (DVT) (7a-7p)  Result Date: 02/16/2021  Lower Venous DVT Study Patient Name:  TRESSY ROST [facility]  Date of Exam:   02/16/2021 Medical Rec #: [id number]              Accession #:    [id number] Date of Birth: 04-21-1975              Patient Gender: F Patient Age:   33 years Exam Location:  [facility] Procedure:      VAS [place] LOWER EXTREMITY VENOUS (DVT) Referring Phys: Theodis Blaze --------------------------------------------------------------------------------  Indications: Swelling.  Risk Factors: None identified. Limitations: Body habitus and poor ultrasound/tissue interface. Comparison Study: No prior studies. Performing Technologist: Oliver Hum RVT  Examination Guidelines: A complete evaluation includes B-mode imaging, spectral Doppler, color Doppler, and power Doppler as needed of all accessible portions of each vessel. Bilateral testing is considered an integral part of a complete examination. Limited examinations for reoccurring indications may be performed as noted. The reflux portion of the exam is performed with the  patient in reverse Trendelenburg.  +-----+---------------+---------+-----------+----------+--------------+ RIGHTCompressibilityPhasicitySpontaneityPropertiesThrombus Aging +-----+---------------+---------+-----------+----------+--------------+ CFV  Full           Yes      Yes                                 +-----+---------------+---------+-----------+----------+--------------+   +---------+---------------+---------+-----------+----------+--------------+ LEFT     CompressibilityPhasicitySpontaneityPropertiesThrombus Aging +---------+---------------+---------+-----------+----------+--------------+ CFV      Full           Yes      Yes                                 +---------+---------------+---------+-----------+----------+--------------+ SFJ      Full                                                        +---------+---------------+---------+-----------+----------+--------------+ FV Prox  Full                                                        +---------+---------------+---------+-----------+----------+--------------+ FV Mid   Full                                                        +---------+---------------+---------+-----------+----------+--------------+  FV Distal               Yes      Yes                                 +---------+---------------+---------+-----------+----------+--------------+ PFV      Full                                                        +---------+---------------+---------+-----------+----------+--------------+ POP      Full           Yes      Yes                                 +---------+---------------+---------+-----------+----------+--------------+ PTV      Full                                                        +---------+---------------+---------+-----------+----------+--------------+ PERO     Full                                                         +---------+---------------+---------+-----------+----------+--------------+    Summary: RIGHT: - No evidence of common femoral vein obstruction.  LEFT: - There is no evidence of deep vein thrombosis in the lower extremity. However, portions of this examination were limited- see technologist comments above.  - No cystic structure found in the popliteal fossa.  *See table(s) above for measurements and observations.    Preliminary     Procedures Procedures   Medications Ordered in ED Medications  sodium chloride 0.9 % bolus 1,000 mL (0 mLs Intravenous Stopped 02/16/21 2148)  sodium chloride 0.9 % bolus 1,000 mL (0 mLs Intravenous Stopped 02/16/21 2149)    ED Course  I have reviewed the triage vital signs and the nursing notes.  Pertinent labs & imaging results that were available during my care of the patient were reviewed by me and considered in my medical decision making (see chart for details).  Patient seen and examined. Work-up initiated.  Fluids ordered.  Added lactate given tachycardia.  Vital signs reviewed and are as follows: BP 130/84   Pulse (!) 122   Temp 98.5 F (36.9 C) (Oral)   Resp (!) 30   Ht 5\' 7"  (1.702 m)   Wt 122.5 kg   SpO2 98%   BMI 42.29 kg/m   Left lower extremity swelling evaluated with ultrasound.  This is negative for DVT.  Potassium and sodium are a bit low, likely due to osmotic diuresis from her high blood sugars which are in the 300s.  No signs of DKA.  Awaiting lactate.  10:53 PM there was a delay in obtaining lactate.  This returned normal.  I went and reassessed the patient.  We discussed all results at bedside.  Patient is comfortable with discharged home.  She has follow-up  with her provider on the 11th for recheck.  She has the Keflex which was she was prescribed earlier.  On reexam, exam unchanged.  Continued swelling.  No deterioration.  Heart rate improved to 110 after 2 L normal saline.  Strongly encouraged that patient take her antibiotics  as prescribed as well as her diabetes medications.  Pt urged to return with worsening pain, worsening swelling, expanding area of redness or streaking up extremity, fever, or any other concerns.  Urged to take complete course of antibiotics as prescribed.  Pt verbalizes understanding and agrees with plan.    MDM Rules/Calculators/A&P                           Left lower leg swelling: Symptoms started about a month ago, worsened over the past week.  DVT study negative for definitive clot today.  Patient's pain is more in her ankle and foot than her calf and I have low concern for a DVT.  Her symptoms are most consistent with cellulitis.  No clinical signs of necrotizing fascitis.  Patient appears well, nontoxic.  Lactate is normal.  White blood cell count is normal.  Hyperglycemia: Related to medication noncompliance and infection.  No signs of DKA.  Recent A1c was in the teens.  Patient encouraged to take her home medications and follow-up for recheck.  Hyponatremia/hypokalemia: Appears to be somewhat chronic, likely exacerbated by osmotic diuresis from hyperglycemia.  Patient seems to be asymptomatic at this time, but will need recheck.  Treated with IV fluids here.  Tachycardia: Likely due to dehydration due to elevated blood sugars.  Heart rate 134 on arrival improved to 110 after administration of IV fluids.  Patient tolerating p.o.'s and has antiemetics at home.  In regards to follow-up, stressed importance of follow-up to ensure that she is clinically improving with antibiotics.  She was given IM antibiotics yesterday and started Keflex today.  Do not consider this treatment failure at this point.  But patient given strict return instructions.  Final Clinical Impression(s) / ED Diagnoses Final diagnoses:  Cellulitis of left lower leg  Hyperglycemia    Rx / DC Orders ED Discharge Orders     None        Carlisle Cater, PA-C 02/16/21 [place], DO 02/17/21 1504

## 2021-02-16 NOTE — ED Provider Notes (Signed)
Emergency Medicine Provider Triage Evaluation Note  Lori Schmidt , a 45 y.o. female  was evaluated in triage.  Pt complains of left lower extremity edema.  She states that she has had ongoing left lower extremity swelling and redness for 1 month however has progressed over the past few days.  She states that she has had increased swelling to the "size of a football" over the weekend.  She states that the redness that she has over her foot has been moving up her leg.  She is a diabetic, doubts having any new trauma to her foot or ulcers.  Additionally she endorses having fever yesterday.  She is not sure if fever is coming from her leg or otherwise that she is started to have cough at home.  She states she took a home COVID test which was negative.  She denies shortness of breath, chest pain, recent long trips.  Review of Systems  Positive: Lower extremity edema, fever  Negative: Shortness of breath   Physical Exam  BP (!) 143/81 (BP Location: Right Arm)   Pulse (!) 134   Temp 98.5 F (36.9 C) (Oral)   Resp (!) 22   SpO2 97%  Gen:   Awake, no distress   Resp:  Normal effort  MSK:   Moves extremities without difficulty  Other:  LLE with marked swelling and redness. Warm to touch. 2+ Pitting edema. Able to palpate pedal pulses.   Medical Decision Making  Medically screening exam initiated at 5:38 PM.  Appropriate orders placed.  Lori Schmidt was informed that the remainder of the evaluation will be completed by another provider, this initial triage assessment does not replace that evaluation, and the importance of remaining in the ED until their evaluation is complete.     Cristopher Peru, PA-C 02/16/21 1740    Gloris Manchester, MD 02/17/21 [phone]

## 2021-02-16 NOTE — Progress Notes (Signed)
Left lower extremity venous duplex has been completed. Preliminary results can be found in CV Proc through chart review.  Results were given to Mertha Baars PA.  02/16/21 6:03 PM Olen Cordial RVT

## 2021-02-16 NOTE — ED Triage Notes (Signed)
Patient reports left foot swelling x 1 month. Worsening Friday and has not been able to put weight on foot. Emesis began about 5 days ago from not eating since she is unable to cook and has no food in the house. Denies any injury to foot. Seen at doc yesterday with labs- showed low sodium.

## 2021-02-16 NOTE — ED Notes (Signed)
IV team at bedside 

## 2021-02-16 NOTE — Discharge Instructions (Addendum)
Please read and follow all provided instructions.  Your diagnoses today include:  1. Cellulitis of left lower leg   2. Hyperglycemia     Tests performed today include: Vital signs. See below for your results today.  Complete blood cell count: Normal infection fighting cell count Basic metabolic panel: Low sodium, potassium and high blood sugar Ultrasound of your leg: Does not show any sign of blood clot Blood test for sepsis: Does not indicate severe infection  Medications prescribed:  None  Take any prescribed medications only as directed.   Home care instructions:  Follow any educational materials contained in this packet. Keep affected area above the level of your heart when possible. Cover the area if it draining or weeping.  Please take your antibiotics as prescribed, including your diabetes medicines  Follow-up instructions: Please follow-up with your provider in 2 days as planned for recheck.  Return instructions:  Return to the Emergency Department if you have: Fever Worsening symptoms Worsening pain Worsening swelling Redness of the skin that moves away from the affected area, especially if it streaks away from the affected area  Any other emergent concerns  Your vital signs today were: BP 128/72   Pulse (!) 113   Temp 98.5 F (36.9 C) (Oral)   Resp (!) 23   Ht 5\' 7"  (1.702 m)   Wt 122.5 kg   SpO2 95%   BMI 42.29 kg/m  If your blood pressure (BP) was elevated above 135/85 this visit, please have this repeated by your doctor within one month. --------------

## 2021-02-17 ENCOUNTER — Encounter (HOSPITAL_COMMUNITY): Payer: Self-pay | Admitting: Emergency Medicine

## 2021-02-17 ENCOUNTER — Inpatient Hospital Stay (HOSPITAL_COMMUNITY)
Admission: EM | Admit: 2021-02-17 | Discharge: 2021-03-05 | DRG: 617 | Disposition: A | Discharge status: Rehabilitation Facility | Payer: Managed Care, Other (non HMO) | Attending: Internal Medicine | Admitting: Internal Medicine

## 2021-02-17 ENCOUNTER — Emergency Department (HOSPITAL_COMMUNITY): Payer: Managed Care, Other (non HMO)

## 2021-02-17 ENCOUNTER — Other Ambulatory Visit: Payer: Self-pay

## 2021-02-17 ENCOUNTER — Telehealth (HOSPITAL_BASED_OUTPATIENT_CLINIC_OR_DEPARTMENT_OTHER): Payer: Self-pay

## 2021-02-17 DIAGNOSIS — M4646 Discitis, unspecified, lumbar region: Secondary | ICD-10-CM

## 2021-02-17 DIAGNOSIS — E11628 Type 2 diabetes mellitus with other skin complications: Secondary | ICD-10-CM | POA: Diagnosis not present

## 2021-02-17 DIAGNOSIS — Z888 Allergy status to other drugs, medicaments and biological substances status: Secondary | ICD-10-CM

## 2021-02-17 DIAGNOSIS — Z803 Family history of malignant neoplasm of breast: Secondary | ICD-10-CM

## 2021-02-17 DIAGNOSIS — L89321 Pressure ulcer of left buttock, stage 1: Secondary | ICD-10-CM | POA: Diagnosis not present

## 2021-02-17 DIAGNOSIS — F5102 Adjustment insomnia: Secondary | ICD-10-CM | POA: Diagnosis not present

## 2021-02-17 DIAGNOSIS — E46 Unspecified protein-calorie malnutrition: Secondary | ICD-10-CM | POA: Diagnosis not present

## 2021-02-17 DIAGNOSIS — F5105 Insomnia due to other mental disorder: Secondary | ICD-10-CM | POA: Diagnosis not present

## 2021-02-17 DIAGNOSIS — E871 Hypo-osmolality and hyponatremia: Secondary | ICD-10-CM | POA: Diagnosis present

## 2021-02-17 DIAGNOSIS — Z79899 Other long term (current) drug therapy: Secondary | ICD-10-CM

## 2021-02-17 DIAGNOSIS — M4626 Osteomyelitis of vertebra, lumbar region: Secondary | ICD-10-CM | POA: Diagnosis present

## 2021-02-17 DIAGNOSIS — M4636 Infection of intervertebral disc (pyogenic), lumbar region: Secondary | ICD-10-CM | POA: Diagnosis present

## 2021-02-17 DIAGNOSIS — E872 Acidosis, unspecified: Secondary | ICD-10-CM | POA: Diagnosis present

## 2021-02-17 DIAGNOSIS — L97529 Non-pressure chronic ulcer of other part of left foot with unspecified severity: Secondary | ICD-10-CM | POA: Diagnosis present

## 2021-02-17 DIAGNOSIS — D62 Acute posthemorrhagic anemia: Secondary | ICD-10-CM | POA: Diagnosis present

## 2021-02-17 DIAGNOSIS — E8809 Other disorders of plasma-protein metabolism, not elsewhere classified: Secondary | ICD-10-CM | POA: Diagnosis not present

## 2021-02-17 DIAGNOSIS — M00872 Arthritis due to other bacteria, left ankle and foot: Secondary | ICD-10-CM | POA: Diagnosis present

## 2021-02-17 DIAGNOSIS — E119 Type 2 diabetes mellitus without complications: Secondary | ICD-10-CM

## 2021-02-17 DIAGNOSIS — R55 Syncope and collapse: Secondary | ICD-10-CM | POA: Diagnosis not present

## 2021-02-17 DIAGNOSIS — W19XXXA Unspecified fall, initial encounter: Secondary | ICD-10-CM | POA: Diagnosis present

## 2021-02-17 DIAGNOSIS — Z7984 Long term (current) use of oral hypoglycemic drugs: Secondary | ICD-10-CM

## 2021-02-17 DIAGNOSIS — E876 Hypokalemia: Secondary | ICD-10-CM | POA: Diagnosis present

## 2021-02-17 DIAGNOSIS — E1169 Type 2 diabetes mellitus with other specified complication: Secondary | ICD-10-CM | POA: Diagnosis not present

## 2021-02-17 DIAGNOSIS — Z89512 Acquired absence of left leg below knee: Secondary | ICD-10-CM | POA: Diagnosis not present

## 2021-02-17 DIAGNOSIS — M00072 Staphylococcal arthritis, left ankle and foot: Secondary | ICD-10-CM | POA: Diagnosis not present

## 2021-02-17 DIAGNOSIS — F4323 Adjustment disorder with mixed anxiety and depressed mood: Secondary | ICD-10-CM

## 2021-02-17 DIAGNOSIS — E1165 Type 2 diabetes mellitus with hyperglycemia: Secondary | ICD-10-CM | POA: Diagnosis present

## 2021-02-17 DIAGNOSIS — M25512 Pain in left shoulder: Secondary | ICD-10-CM | POA: Diagnosis present

## 2021-02-17 DIAGNOSIS — E1142 Type 2 diabetes mellitus with diabetic polyneuropathy: Secondary | ICD-10-CM | POA: Diagnosis present

## 2021-02-17 DIAGNOSIS — M609 Myositis, unspecified: Secondary | ICD-10-CM | POA: Diagnosis present

## 2021-02-17 DIAGNOSIS — M869 Osteomyelitis, unspecified: Secondary | ICD-10-CM

## 2021-02-17 DIAGNOSIS — E11621 Type 2 diabetes mellitus with foot ulcer: Secondary | ICD-10-CM | POA: Diagnosis present

## 2021-02-17 DIAGNOSIS — E785 Hyperlipidemia, unspecified: Secondary | ICD-10-CM | POA: Diagnosis present

## 2021-02-17 DIAGNOSIS — R7881 Bacteremia: Secondary | ICD-10-CM | POA: Diagnosis not present

## 2021-02-17 DIAGNOSIS — E538 Deficiency of other specified B group vitamins: Secondary | ICD-10-CM | POA: Diagnosis present

## 2021-02-17 DIAGNOSIS — F329 Major depressive disorder, single episode, unspecified: Secondary | ICD-10-CM | POA: Diagnosis present

## 2021-02-17 DIAGNOSIS — I10 Essential (primary) hypertension: Secondary | ICD-10-CM | POA: Diagnosis present

## 2021-02-17 DIAGNOSIS — Z833 Family history of diabetes mellitus: Secondary | ICD-10-CM

## 2021-02-17 DIAGNOSIS — M25472 Effusion, left ankle: Secondary | ICD-10-CM | POA: Diagnosis present

## 2021-02-17 DIAGNOSIS — R32 Unspecified urinary incontinence: Secondary | ICD-10-CM | POA: Diagnosis present

## 2021-02-17 DIAGNOSIS — L8990 Pressure ulcer of unspecified site, unspecified stage: Secondary | ICD-10-CM | POA: Insufficient documentation

## 2021-02-17 DIAGNOSIS — Z794 Long term (current) use of insulin: Secondary | ICD-10-CM | POA: Diagnosis not present

## 2021-02-17 DIAGNOSIS — K5900 Constipation, unspecified: Secondary | ICD-10-CM | POA: Diagnosis present

## 2021-02-17 DIAGNOSIS — M62830 Muscle spasm of back: Secondary | ICD-10-CM | POA: Diagnosis not present

## 2021-02-17 DIAGNOSIS — M86272 Subacute osteomyelitis, left ankle and foot: Secondary | ICD-10-CM

## 2021-02-17 DIAGNOSIS — Z20822 Contact with and (suspected) exposure to covid-19: Secondary | ICD-10-CM | POA: Diagnosis present

## 2021-02-17 DIAGNOSIS — R059 Cough, unspecified: Secondary | ICD-10-CM | POA: Diagnosis present

## 2021-02-17 DIAGNOSIS — L03116 Cellulitis of left lower limb: Secondary | ICD-10-CM

## 2021-02-17 DIAGNOSIS — B9561 Methicillin susceptible Staphylococcus aureus infection as the cause of diseases classified elsewhere: Secondary | ICD-10-CM | POA: Diagnosis present

## 2021-02-17 DIAGNOSIS — G8929 Other chronic pain: Secondary | ICD-10-CM | POA: Diagnosis present

## 2021-02-17 DIAGNOSIS — Z87891 Personal history of nicotine dependence: Secondary | ICD-10-CM

## 2021-02-17 DIAGNOSIS — A4101 Sepsis due to Methicillin susceptible Staphylococcus aureus: Secondary | ICD-10-CM | POA: Diagnosis not present

## 2021-02-17 DIAGNOSIS — R000 Tachycardia, unspecified: Secondary | ICD-10-CM | POA: Diagnosis present

## 2021-02-17 DIAGNOSIS — Z882 Allergy status to sulfonamides status: Secondary | ICD-10-CM

## 2021-02-17 DIAGNOSIS — F32A Depression, unspecified: Secondary | ICD-10-CM | POA: Diagnosis not present

## 2021-02-17 DIAGNOSIS — M7522 Bicipital tendinitis, left shoulder: Secondary | ICD-10-CM | POA: Diagnosis present

## 2021-02-17 DIAGNOSIS — M7989 Other specified soft tissue disorders: Secondary | ICD-10-CM | POA: Diagnosis present

## 2021-02-17 HISTORY — DX: Essential (primary) hypertension: I10

## 2021-02-17 HISTORY — DX: Hyperlipidemia, unspecified: E78.5

## 2021-02-17 HISTORY — DX: Type 2 diabetes mellitus without complications: E11.9

## 2021-02-17 LAB — BLOOD CULTURE ID PANEL (REFLEXED) - BCID2

## 2021-02-17 LAB — I-STAT BETA HCG BLOOD, ED (MC, WL, AP ONLY): I-stat hCG, quantitative: 11.3 m[IU]/mL — ABNORMAL HIGH (ref <5)

## 2021-02-17 LAB — BASIC METABOLIC PANEL
Anion gap: 18 — ABNORMAL HIGH (ref 5–15)
BUN: 12 mg/dL (ref 6–20)
CO2: 17 mmol/L — ABNORMAL LOW (ref 22–32)
Calcium: 8.4 mg/dL — ABNORMAL LOW (ref 8.9–10.3)
Chloride: 93 mmol/L — ABNORMAL LOW (ref 98–111)
Creatinine, Ser: 0.64 mg/dL (ref 0.44–1.00)
GFR, Estimated: >60 mL/min (ref >60)
Glucose, Bld: 335 mg/dL — ABNORMAL HIGH (ref 70–99)
Potassium: 2.7 mmol/L — CL (ref 3.5–5.1)
Sodium: 128 mmol/L — ABNORMAL LOW (ref 135–145)

## 2021-02-17 LAB — RESP PANEL BY RT-PCR (FLU A&B, COVID) ARPGX2
Influenza A by PCR: NEGATIVE
Influenza B by PCR: NEGATIVE
SARS Coronavirus 2 by RT PCR: NEGATIVE

## 2021-02-17 LAB — CBC WITH DIFFERENTIAL/PLATELET
Abs Immature Granulocytes: 0.1 10*3/uL — ABNORMAL HIGH (ref 0.00–0.07)
Basophils Absolute: 0 10*3/uL (ref 0.0–0.1)
Basophils Relative: 0 %
Eosinophils Absolute: 0 10*3/uL (ref 0.0–0.5)
Eosinophils Relative: 0 %
HCT: 31.7 % — ABNORMAL LOW (ref 36.0–46.0)
Hemoglobin: 10.8 g/dL — ABNORMAL LOW (ref 12.0–15.0)
Lymphocytes Relative: 4 %
Lymphs Abs: 0.3 10*3/uL — ABNORMAL LOW (ref 0.7–4.0)
MCH: 28.5 pg (ref 26.0–34.0)
MCHC: 34.1 g/dL (ref 30.0–36.0)
MCV: 83.6 fL (ref 80.0–100.0)
Monocytes Absolute: 0.1 10*3/uL (ref 0.1–1.0)
Monocytes Relative: 1 %
Neutro Abs: 8.1 10*3/uL — ABNORMAL HIGH (ref 1.7–7.7)
Neutrophils Relative %: 94 %
Platelets: 175 10*3/uL (ref 150–400)
Promyelocytes Relative: 1 %
RBC: 3.79 MIL/uL — ABNORMAL LOW (ref 3.87–5.11)
RDW: 12.7 % (ref 11.5–15.5)
WBC: 8.6 10*3/uL (ref 4.0–10.5)
nRBC: 0 % (ref 0.0–0.2)
nRBC: 0 /100 WBC

## 2021-02-17 LAB — LACTIC ACID, PLASMA: Lactic Acid, Venous: 1.2 mmol/L (ref 0.5–1.9)

## 2021-02-17 IMAGING — CR DG FOOT COMPLETE 3+V*L*
3 series · 3 of 3 positions shown · non-contrast
Comparison: None.

CLINICAL DATA: Left foot infection.

EXAM:
LEFT FOOT - COMPLETE 3+ VIEW

[foot ap]
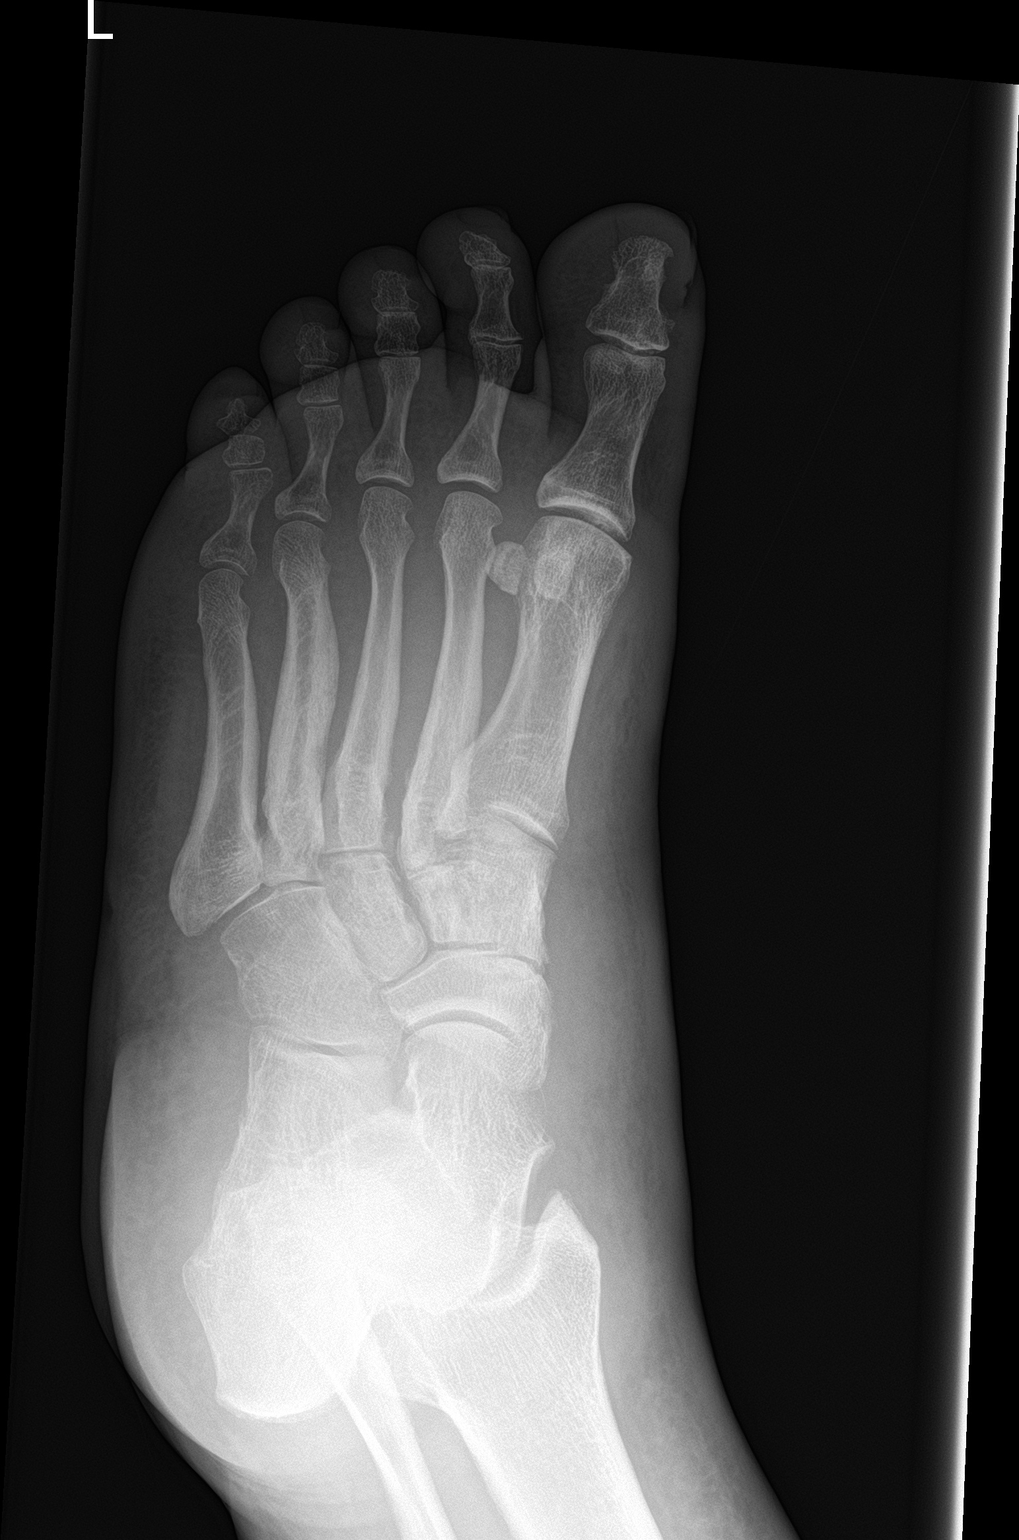

[foot obl]
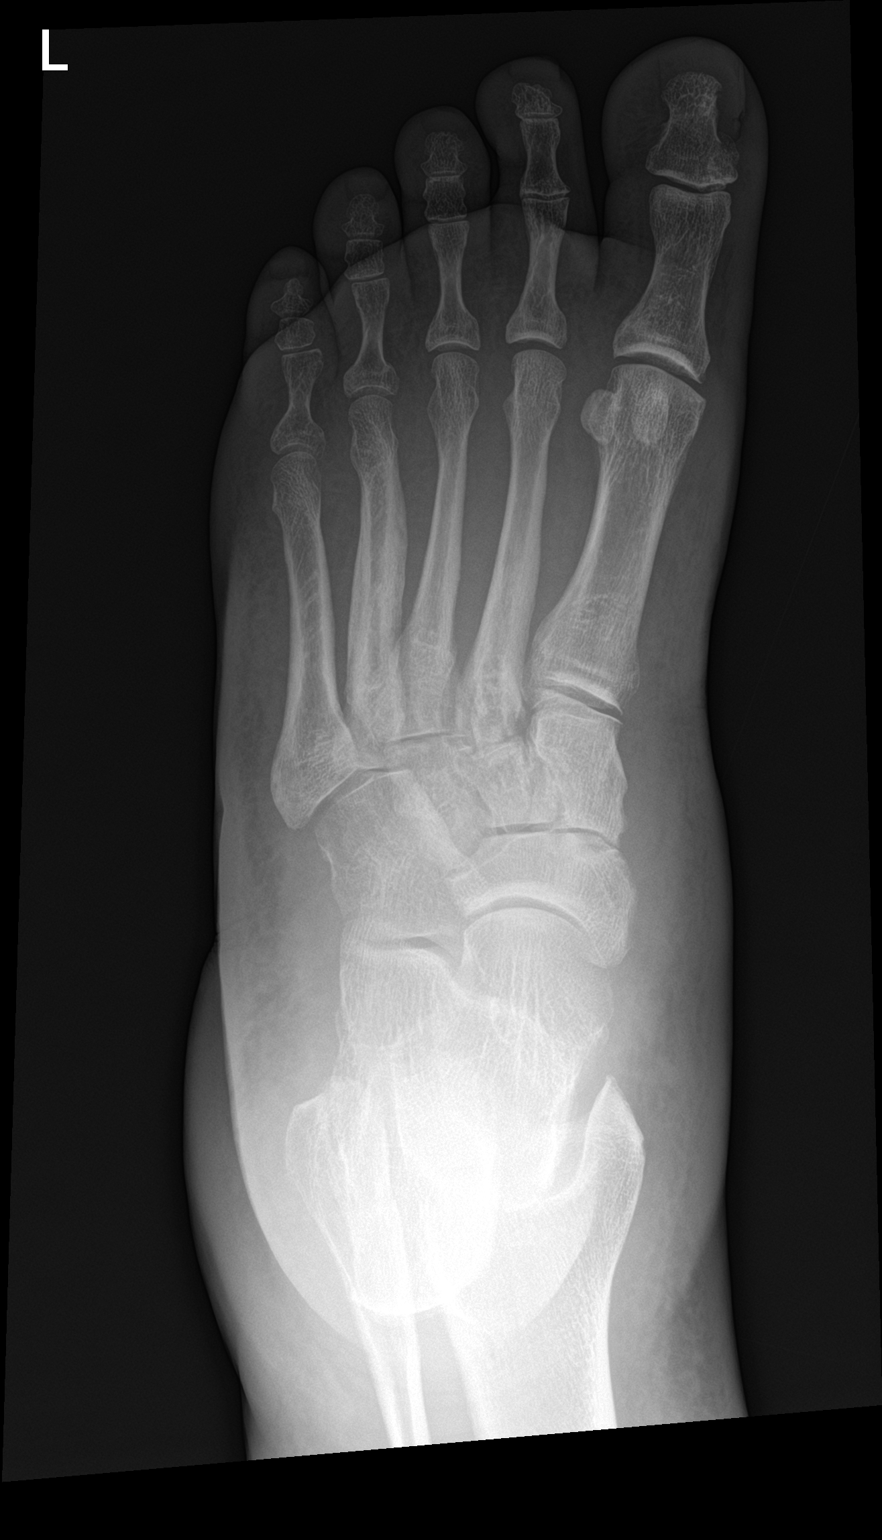

[foot lat]
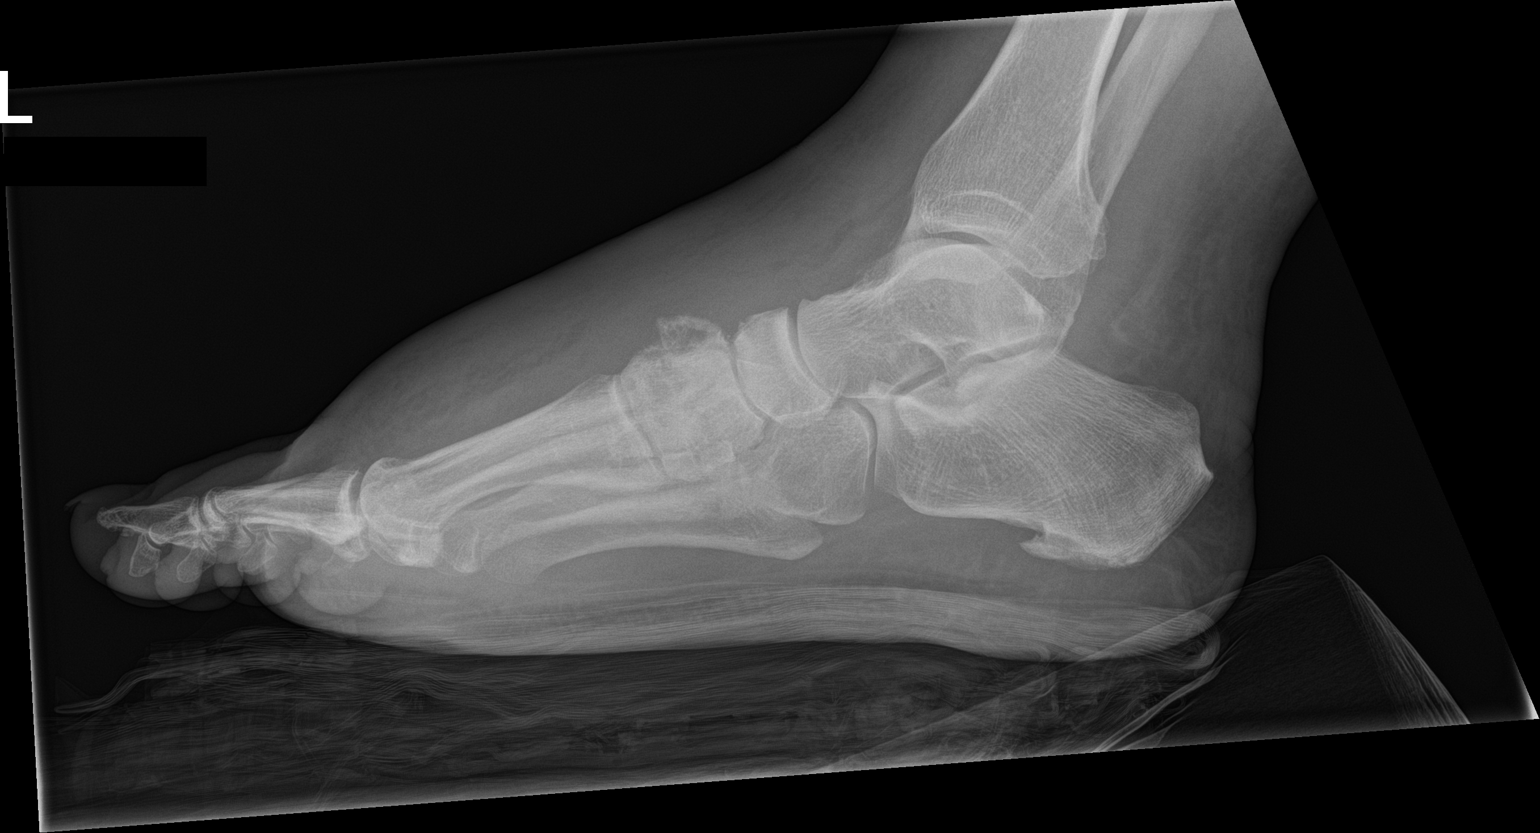

[3 of 3 positions shown; findings below may reference images not displayed]

FINDINGS: No acute fracture or dislocation. Irregularity of the dorsal cortex
of a tarsal bone, likely medial cuneiform. This appears chronic. An
infectious process/osteomyelitis is less likely but not excluded
clinical correlation recommended there is diffuse soft tissue
swelling and subcutaneous edema of the dorsum of the foot. No
radiopaque foreign object or soft tissue gas.
IMPRESSION: 1. No acute fracture or dislocation.
2. Diffuse soft tissue swelling and subcutaneous edema of the dorsum
of the foot. Findings may represent cellulitis. Clinical correlation
is recommended.
3. Irregularity of the dorsal cortex of a tarsal bone, likely medial
cuneiform.

## 2021-02-17 MED ORDER — LINAGLIPTIN 5 MG PO TABS
5.0000 mg | ORAL_TABLET | Freq: Every day | ORAL | Status: DC
  Administered 2021-02-18: 5 mg via ORAL
  Filled 2021-02-17 (×2): qty 1

## 2021-02-17 MED ORDER — INSULIN DETEMIR 100 UNIT/ML ~~LOC~~ SOLN
50.0000 [IU] | Freq: Every day | SUBCUTANEOUS | Status: DC
  Administered 2021-02-18: 50 [IU] via SUBCUTANEOUS
  Filled 2021-02-17: qty 0.5

## 2021-02-17 MED ORDER — PRAVASTATIN SODIUM 10 MG PO TABS: 20.0000 mg | ORAL_TABLET | Freq: Every day | ORAL | Status: DC

## 2021-02-17 MED ORDER — ENOXAPARIN SODIUM 60 MG/0.6ML IJ SOSY
0.5000 mg/kg | PREFILLED_SYRINGE | INTRAMUSCULAR | Status: DC
  Filled 2021-02-17: qty 0.63

## 2021-02-17 MED ORDER — IBUPROFEN 800 MG PO TABS
800.0000 mg | ORAL_TABLET | Freq: Three times a day (TID) | ORAL | Status: DC | PRN
  Administered 2021-02-18 – 2021-02-26 (×8): 800 mg via ORAL
  Filled 2021-02-17 (×8): qty 1

## 2021-02-17 MED ORDER — CITALOPRAM HYDROBROMIDE 40 MG PO TABS
40.0000 mg | ORAL_TABLET | Freq: Every day | ORAL | Status: DC
  Administered 2021-02-18 – 2021-03-05 (×16): 40 mg via ORAL
  Filled 2021-02-17 (×16): qty 1

## 2021-02-17 MED ORDER — LOSARTAN POTASSIUM 25 MG PO TABS
25.0000 mg | ORAL_TABLET | Freq: Every day | ORAL | Status: DC
Start: 2021-02-17 — End: 2021-02-18

## 2021-02-17 MED ORDER — VANCOMYCIN HCL IN DEXTROSE 1-5 GM/200ML-% IV SOLN: 1000.0000 mg | Freq: Once | INTRAVENOUS | Status: DC

## 2021-02-17 MED ORDER — POTASSIUM CHLORIDE 10 MEQ/100ML IV SOLN
10.0000 meq | INTRAVENOUS | Status: DC
  Administered 2021-02-17 – 2021-02-18 (×2): 10 meq via INTRAVENOUS
  Filled 2021-02-17 (×2): qty 100

## 2021-02-17 MED ORDER — CEFAZOLIN SODIUM-DEXTROSE 2-4 GM/100ML-% IV SOLN
2.0000 g | Freq: Three times a day (TID) | INTRAVENOUS | Status: DC
  Administered 2021-02-17 – 2021-03-05 (×48): 2 g via INTRAVENOUS
  Filled 2021-02-17 (×49): qty 100

## 2021-02-17 MED ORDER — CITALOPRAM HYDROBROMIDE 10 MG PO TABS: 20.0000 mg | ORAL_TABLET | Freq: Every day | ORAL | Status: DC

## 2021-02-17 MED ORDER — SODIUM CHLORIDE 0.9 % IV BOLUS
1000.0000 mL | Freq: Once | INTRAVENOUS | Status: AC
  Administered 2021-02-17: 1000 mL via INTRAVENOUS

## 2021-02-17 MED ORDER — VITAMIN B-12 1000 MCG PO TABS
500.0000 ug | ORAL_TABLET | Freq: Every day | ORAL | Status: DC
  Administered 2021-02-18: 500 ug via ORAL
  Filled 2021-02-17: qty 1

## 2021-02-17 MED ORDER — ENOXAPARIN SODIUM 60 MG/0.6ML IJ SOSY
60.0000 mg | PREFILLED_SYRINGE | INTRAMUSCULAR | Status: DC
  Administered 2021-02-18 – 2021-03-05 (×15): 60 mg via SUBCUTANEOUS
  Filled 2021-02-17 (×15): qty 0.6

## 2021-02-17 MED ORDER — POTASSIUM CHLORIDE CRYS ER 20 MEQ PO TBCR
40.0000 meq | EXTENDED_RELEASE_TABLET | Freq: Two times a day (BID) | ORAL | Status: DC
  Administered 2021-02-17: 40 meq via ORAL
  Filled 2021-02-17 (×2): qty 2

## 2021-02-17 MED ORDER — INSULIN DETEMIR 100 UNIT/ML ~~LOC~~ SOLN: 100.0000 [IU] | Freq: Every day | SUBCUTANEOUS | Status: DC

## 2021-02-17 NOTE — ED Provider Notes (Signed)
[facility] EMERGENCY DEPARTMENT Provider Note   CSN: [id number] Arrival date & time: 02/17/21  1606     History Chief Complaint  Patient presents with   Cellulitis    Lori Schmidt is a 45 y.o. female.  Patient was seen yesterday at [facility] for cellulitis. Blood cultures were drawn. Patient notified today that cultures are positive for staph aureus. Patient was contacted and advised to return to the ED. Patient denies current fever or chills. No chest pain or shortness of breath. Patient reports some improvement in leg discomfort and swelling since starting antibiotic (keflex) yesterday.  The history is provided by the patient and medical records. No language interpreter was used.  Extremity Pain Pertinent negatives include no chest pain, no abdominal pain and no shortness of breath.      Past Medical History:  Diagnosis Date   Obesity    Prediabetes    told "borderline"    Patient Active Problem List   Diagnosis Date Noted   Morbid obesity with BMI of 45.0-49.9, adult (HCC) 01/05/2012    Past Surgical History:  Procedure Laterality Date   BREAST CYST EXCISION Right 05/2018   four cysts removed on right breast/axilla area     OB History     Gravida  2   Para  2   Term      Preterm      AB      Living  2      SAB      IAB      Ectopic      Multiple      Live Births              Family History  Problem Relation Age of Onset   Hypothyroidism Mother    Diabetes Mother    Diabetes Maternal Grandmother    Breast cancer Neg Hx     Social History   Tobacco Use   Smoking status: Former    Types: Cigarettes    Quit date: 12/11/2010    Years since quitting: 10.1   Smokeless tobacco: Never  Substance Use Topics   Alcohol use: No   Drug use: No    Home Medications Prior to Admission medications   Medication Sig Start Date End Date Taking? Authorizing Provider  cephALEXin (KEFLEX) 500 MG capsule Take 500  mg by mouth 3 (three) times daily. 05/15/18   [provider]  citalopram (CELEXA) 20 MG tablet Take 20 mg by mouth daily. 03/15/18   [provider]  insulin detemir (LEVEMIR) 100 UNIT/ML injection Inject 100 Units into the skin at bedtime.    [provider]  JANUVIA 100 MG tablet Take 100 mg by mouth at bedtime. 03/13/18   [provider]  magnesium oxide (MAG-OX) 400 MG tablet Take 400 mg by mouth daily.    [provider]  medroxyPROGESTERone Acetate (DEPO-PROVERA IM) Inject into the muscle every 3 (three) months.    [provider]  metFORMIN (GLUCOPHAGE) 1000 MG tablet Take 1,000 mg by mouth at bedtime.  02/22/18   [provider]  ondansetron (ZOFRAN) 8 MG tablet Take 8 mg by mouth every 8 (eight) hours as needed for nausea or vomiting.    [provider]  vitamin B-12 (CYANOCOBALAMIN) 500 MCG tablet Take 500 mcg by mouth daily.    [provider]    Allergies    Sulfa antibiotics and Lisinopril  Review of Systems   Review of Systems  Constitutional:  Negative for fever.  Respiratory:  Negative for shortness of breath.   Cardiovascular:  Negative for chest pain.  Gastrointestinal:  Negative for abdominal pain.  Skin:  Positive for color change.  All other systems reviewed and are negative.  Physical Exam Updated Vital Signs BP 113/72 (BP Location: Left Arm)   Pulse (!) 105   Temp 98.2 F (36.8 C)   Resp 15   SpO2 97%   Physical Exam Constitutional:      Appearance: She is obese.  HENT:     Head: Normocephalic.     Nose: Nose normal.     Mouth/Throat:     Mouth: Mucous membranes are moist.  Eyes:     Conjunctiva/sclera: Conjunctivae normal.  Cardiovascular:     Rate and Rhythm: Tachycardia present.  Pulmonary:     Effort: Pulmonary effort is normal.  Musculoskeletal:        General: Swelling present.     Left lower leg: Edema present.  Skin:    General: Skin is warm and dry.   Neurological:     Mental Status: She is alert and oriented to person, place, and time.  Psychiatric:        Mood and Affect: Mood normal.        Behavior: Behavior normal.    ED Results / Procedures / Treatments   Labs (all labs ordered are listed, but only abnormal results are displayed) Labs Reviewed  CBC WITH DIFFERENTIAL/PLATELET - Abnormal; Notable for the following components:      Result Value   RBC 3.79 (*)    Hemoglobin 10.8 (*)    HCT 31.7 (*)    All other components within normal limits  I-STAT BETA HCG BLOOD, ED ([facility], [facility], [facility] ONLY) - Abnormal; Notable for the following components:   I-stat hCG, quantitative 11.3 (*)    All other components within normal limits  BASIC METABOLIC PANEL  LACTIC ACID, PLASMA   Results for orders placed or performed during the hospital encounter of 02/17/21  Basic metabolic panel  Result Value Ref Range   Sodium 128 (L) 135 - 145 mmol/L   Potassium 2.7 (LL) 3.5 - 5.1 mmol/L   Chloride 93 (L) 98 - 111 mmol/L   CO2 17 (L) 22 - 32 mmol/L   Glucose, Bld 335 (H) 70 - 99 mg/dL   BUN 12 6 - 20 mg/dL   Creatinine, Ser 4.50 0.44 - 1.00 mg/dL   Calcium 8.4 (L) 8.9 - 10.3 mg/dL   GFR, Estimated >38 >88 mL/min   Anion gap 18 (H) 5 - 15  CBC with Differential/Platelet  Result Value Ref Range   WBC 8.6 4.0 - 10.5 K/uL   RBC 3.79 (L) 3.87 - 5.11 MIL/uL   Hemoglobin 10.8 (L) 12.0 - 15.0 g/dL   HCT 28.0 (L) 03.4 - 91.7 %   MCV 83.6 80.0 - 100.0 fL   MCH 28.5 26.0 - 34.0 pg   MCHC 34.1 30.0 - 36.0 g/dL   RDW 91.5 05.6 - 97.9 %   Platelets 175 150 - 400 K/uL   nRBC 0.0 0.0 - 0.2 %   Neutrophils Relative % 94 %   Neutro Abs 8.1 (H) 1.7 - 7.7 K/uL   Lymphocytes Relative 4 %   Lymphs Abs 0.3 (L) 0.7 - 4.0 K/uL   Monocytes Relative 1 %   Monocytes Absolute 0.1 0.1 - 1.0 K/uL   Eosinophils Relative 0 %   Eosinophils Absolute 0.0 0.0 - 0.5 K/uL  Basophils Relative 0 %   Basophils Absolute 0.0 0.0 - 0.1 K/uL   nRBC 0 0 /100 WBC   Promyelocytes  Relative 1 %   Abs Immature Granulocytes 0.10 (H) 0.00 - 0.07 K/uL  Lactic acid, plasma  Result Value Ref Range   Lactic Acid, Venous 1.2 0.5 - 1.9 mmol/L  I-Stat beta hCG blood, ED  Result Value Ref Range   I-stat hCG, quantitative 11.3 (H) <5 mIU/mL   Comment 3           DG Foot Complete Left  Result Date: 02/17/2021 CLINICAL DATA:  Left foot infection. EXAM: LEFT FOOT - COMPLETE 3+ VIEW COMPARISON:  None. FINDINGS: No acute fracture or dislocation. Irregularity of the dorsal cortex of a tarsal bone, likely medial cuneiform. This appears chronic. An infectious process/osteomyelitis is less likely but not excluded clinical correlation recommended there is diffuse soft tissue swelling and subcutaneous edema of the dorsum of the foot. No radiopaque foreign object or soft tissue gas. IMPRESSION: 1. No acute fracture or dislocation. 2. Diffuse soft tissue swelling and subcutaneous edema of the dorsum of the foot. Findings may represent cellulitis. Clinical correlation is recommended. 3. Irregularity of the dorsal cortex of a tarsal bone, likely medial cuneiform. Electronically Signed   By: Elgie Collard M.D.   On: 02/17/2021 19:48   VAS [place] LOWER EXTREMITY VENOUS (DVT) (7a-7p)  Result Date: 02/16/2021  Lower Venous DVT Study Patient Name:  Lori Schmidt [facility]  Date of Exam:   02/16/2021 Medical Rec #: [id number]              Accession #:    [id number] Date of Birth: 04-Mar-1976              Patient Gender: F Patient Age:   68 years Exam Location:  [facility] Procedure:      VAS [place] LOWER EXTREMITY VENOUS (DVT) Referring Phys: Mertha Baars --------------------------------------------------------------------------------  Indications: Swelling.  Risk Factors: None identified. Limitations: Body habitus and poor ultrasound/tissue interface. Comparison Study: No prior studies. Performing Technologist: Chanda Busing RVT  Examination Guidelines: A complete evaluation includes B-mode imaging, spectral  Doppler, color Doppler, and power Doppler as needed of all accessible portions of each vessel. Bilateral testing is considered an integral part of a complete examination. Limited examinations for reoccurring indications may be performed as noted. The reflux portion of the exam is performed with the patient in reverse Trendelenburg.  +-----+---------------+---------+-----------+----------+--------------+ RIGHTCompressibilityPhasicitySpontaneityPropertiesThrombus Aging +-----+---------------+---------+-----------+----------+--------------+ CFV  Full           Yes      Yes                                 +-----+---------------+---------+-----------+----------+--------------+   +---------+---------------+---------+-----------+----------+--------------+ LEFT     CompressibilityPhasicitySpontaneityPropertiesThrombus Aging +---------+---------------+---------+-----------+----------+--------------+ CFV      Full           Yes      Yes                                 +---------+---------------+---------+-----------+----------+--------------+ SFJ      Full                                                        +---------+---------------+---------+-----------+----------+--------------+  FV Prox  Full                                                        +---------+---------------+---------+-----------+----------+--------------+ FV Mid   Full                                                        +---------+---------------+---------+-----------+----------+--------------+ FV Distal               Yes      Yes                                 +---------+---------------+---------+-----------+----------+--------------+ PFV      Full                                                        +---------+---------------+---------+-----------+----------+--------------+ POP      Full           Yes      Yes                                  +---------+---------------+---------+-----------+----------+--------------+ PTV      Full                                                        +---------+---------------+---------+-----------+----------+--------------+ PERO     Full                                                        +---------+---------------+---------+-----------+----------+--------------+     Summary: RIGHT: - No evidence of common femoral vein obstruction.  LEFT: - There is no evidence of deep vein thrombosis in the lower extremity. However, portions of this examination were limited- see technologist comments above.  - No cystic structure found in the popliteal fossa.  *See table(s) above for measurements and observations. Electronically signed by Waverly Ferrari MD on 02/16/2021 at 9:25:44 PM.    Final     EKG None  Radiology DG Foot Complete Left  Result Date: 02/17/2021 CLINICAL DATA:  Left foot infection. EXAM: LEFT FOOT - COMPLETE 3+ VIEW COMPARISON:  None. FINDINGS: No acute fracture or dislocation. Irregularity of the dorsal cortex of a tarsal bone, likely medial cuneiform. This appears chronic. An infectious process/osteomyelitis is less likely but not excluded clinical correlation recommended there is diffuse soft tissue swelling and subcutaneous edema of the dorsum of the foot. No radiopaque foreign object or soft tissue gas. IMPRESSION: 1. No acute fracture or dislocation. 2. Diffuse soft tissue swelling and subcutaneous edema of  the dorsum of the foot. Findings may represent cellulitis. Clinical correlation is recommended. 3. Irregularity of the dorsal cortex of a tarsal bone, likely medial cuneiform. Electronically Signed   By: Elgie Collard M.D.   On: 02/17/2021 19:48   VAS [place] LOWER EXTREMITY VENOUS (DVT) (7a-7p)  Result Date: 02/16/2021  Lower Venous DVT Study Patient Name:  AKANKSHA BELLMORE [facility]  Date of Exam:   02/16/2021 Medical Rec #: [id number]              Accession #:    [id number] Date of  Birth: 05-30-1975              Patient Gender: F Patient Age:   70 years Exam Location:  [facility] Procedure:      VAS [place] LOWER EXTREMITY VENOUS (DVT) Referring Phys: Mertha Baars --------------------------------------------------------------------------------  Indications: Swelling.  Risk Factors: None identified. Limitations: Body habitus and poor ultrasound/tissue interface. Comparison Study: No prior studies. Performing Technologist: Chanda Busing RVT  Examination Guidelines: A complete evaluation includes B-mode imaging, spectral Doppler, color Doppler, and power Doppler as needed of all accessible portions of each vessel. Bilateral testing is considered an integral part of a complete examination. Limited examinations for reoccurring indications may be performed as noted. The reflux portion of the exam is performed with the patient in reverse Trendelenburg.  +-----+---------------+---------+-----------+----------+--------------+ RIGHTCompressibilityPhasicitySpontaneityPropertiesThrombus Aging +-----+---------------+---------+-----------+----------+--------------+ CFV  Full           Yes      Yes                                 +-----+---------------+---------+-----------+----------+--------------+   +---------+---------------+---------+-----------+----------+--------------+ LEFT     CompressibilityPhasicitySpontaneityPropertiesThrombus Aging +---------+---------------+---------+-----------+----------+--------------+ CFV      Full           Yes      Yes                                 +---------+---------------+---------+-----------+----------+--------------+ SFJ      Full                                                        +---------+---------------+---------+-----------+----------+--------------+ FV Prox  Full                                                        +---------+---------------+---------+-----------+----------+--------------+ FV Mid   Full                                                         +---------+---------------+---------+-----------+----------+--------------+ FV Distal               Yes      Yes                                 +---------+---------------+---------+-----------+----------+--------------+ PFV  Full                                                        +---------+---------------+---------+-----------+----------+--------------+ POP      Full           Yes      Yes                                 +---------+---------------+---------+-----------+----------+--------------+ PTV      Full                                                        +---------+---------------+---------+-----------+----------+--------------+ PERO     Full                                                        +---------+---------------+---------+-----------+----------+--------------+     Summary: RIGHT: - No evidence of common femoral vein obstruction.  LEFT: - There is no evidence of deep vein thrombosis in the lower extremity. However, portions of this examination were limited- see technologist comments above.  - No cystic structure found in the popliteal fossa.  *See table(s) above for measurements and observations. Electronically signed by Waverly Ferrari MD on 02/16/2021 at 9:25:44 PM.    Final     Procedures Procedures   Medications Ordered in ED Medications  ceFAZolin (ANCEF) IVPB 2g/100 mL premix (2 g Intravenous New Bag/Given 02/17/21 2142)  potassium chloride SA (KLOR-CON) CR tablet 40 mEq (has no administration in time range)  potassium chloride 10 mEq in 100 mL IVPB (has no administration in time range)  sodium chloride 0.9 % bolus 1,000 mL (1,000 mLs Intravenous New Bag/Given 02/17/21 2143)    ED Course  I have reviewed the triage vital signs and the nursing notes.  Pertinent labs & imaging results that were available during my care of the patient were reviewed by me and considered in my  medical decision making (see chart for details).  Patient with cellulitis of left leg. Negative DVT study yesterday. Blood culture positive for staph aureus. Initially ordered vancomycin, changed to Ancef per pharmacy recommendation. Will request admission.    Discussed with Dr. Karilyn Cota with IM Teaching Service for admission.  MDM Rules/Calculators/A&P                            Final Clinical Impression(s) / ED Diagnoses Final diagnoses:  Left leg cellulitis  Bacteremia due to Staphylococcus aureus    Rx / DC Orders ED Discharge Orders     None        Felicie Morn, NP 02/17/21 2224    Melene Plan, DO 02/17/21 2342

## 2021-02-17 NOTE — ED Triage Notes (Signed)
Pt returns to ED after receiving call that her blood cultures were positive. PT came to ED yesterday for L foot infection, noticeable redness and swelling to area x 1 month

## 2021-02-17 NOTE — H&P (Signed)
Date: 02/18/2021               Patient Name:  Lori Schmidt MRN: [id number]  DOB: 18-Sep-1975 Age / Sex: 45 y.o., female   PCP: Primus Bravo, NP         Medical Service: Internal Medicine Teaching Service         Attending Physician: Dr. Mayford Knife, Dorene Ar, MD    First Contact: Dr. Elza Rafter Pager: [phone]  Second Contact: Dr. Doran Stabler Pager: [phone]       After Hours (After 5p/  First Contact Pager: [phone]  weekends / holidays): Second Contact Pager: [phone]   Chief Complaint: Left lower extremity swelling  History of Present Illness:  [name] is a 45 year old Caucasian female with past medical history of obesity, type 2 diabetes, depression, hyperlipidemia, who presents for lower extremity swelling.  Patient reports 1 month history of gradually worsening lower extremity swelling with erythema.  Denies injury to the leg.  Patient was managing the symptoms at home with ibuprofen, soaking the foot, however her foot continued to worsen.  Patient endorsed fever and malaise. Several days ago 11/03 patient was seen at [facility] for her lower extremity swelling.  [facility] PA had concern for DVT, despite patient walking daily at work.  Patient left appointment AMA without lower extremity ultrasound completed.  The following day patient notes that the swelling had progressed to the point where she could no longer ambulate, with intermittent pain in her foot.  She also reports 1 week history of nausea and vomiting.  Last bout of emesis several days ago.  11/8 patient was seen at urgent care, was given shot of ceftriaxone and started on Keflex and Zofran. 11/9 patient presented to [facility] ED for same complaint.  Lower extremity ultrasound negative for DVT.  Blood cultures collected.  Patient was discharged home and encouraged to take her antibiotics.  The following day patient was contacted for positive blood cultures growing MSSA and was  told to return to the ED.  Currently patient endorsing left lower extremity pain and is requesting ibuprofen.  She denies chest pain, shortness of breath, cough, abdominal pain, dysuria, nausea and vomiting.  She reports history of diabetes, endorses adherence to diabetes regimen.  In the ED, patient is afebrile, tachycardic to 118, normotensive, satting well on room air.  Labs notable for low potassium 2.7, glucose 335 with anion gap of 8 and bicarb 17, sodium low at 128.  CBC without leukocytosis with normocytic anemia with hemoglobin 10.8.  Lactic acid normal at 1.2.  X-ray of left foot without acute fracture dislocation, with diffuse soft tissue swelling and subcutaneous edema of the dorsum of the foot.  Patient received dose of Ancef and 1 L fluid bolus in the ED.  Meds:  Current Outpatient Medications  Medication Instructions   cephALEXin (KEFLEX) 500 mg, Oral, See admin instructions, Every 6 hours x 10 days   citalopram (CELEXA) 20 mg, Daily   citalopram (CELEXA) 40 mg, Oral, Daily at bedtime   ibuprofen (ADVIL) 600 mg, Oral, Every 6 hours PRN   insulin detemir (LEVEMIR) 100 Units, Subcutaneous, Daily at bedtime   Januvia 100 mg, Oral, Daily at bedtime   lisinopril (ZESTRIL) 5 mg, Daily at bedtime   losartan (COZAAR) 25 mg, Oral, Daily at bedtime   magnesium oxide (MAG-OX) 400 mg, Oral, Daily   medroxyPROGESTERone Acetate (DEPO-PROVERA IM) Intramuscular, Every 3 months   metFORMIN (GLUCOPHAGE) 500 mg,  Oral, Daily at bedtime   ondansetron (ZOFRAN) 8 mg, Oral, Every 8 hours PRN   pravastatin (PRAVACHOL) 20 mg, Oral, Daily at bedtime   vitamin B-12 (CYANOCOBALAMIN) 500 mcg, Oral, Daily    Allergies: Allergies as of 02/17/2021 - Review Complete 02/17/2021  Allergen Reaction Noted   Sulfa antibiotics Other (See Comments) 01/05/2012   Lisinopril Cough 11/13/2014   Past Medical History:  Diagnosis Date   Hyperlipidemia    Obesity    Type 2 diabetes mellitus ([place])     Family  History: Family History  Problem Relation Age of Onset   Hypothyroidism Mother    Diabetes Mother    Diabetes Maternal Grandmother    Breast cancer Paternal Grandmother     Social History:  Social History   Tobacco Use   Smoking status: Former    Types: Cigarettes    Quit date: 12/11/2010    Years since quitting: 10.1   Smokeless tobacco: Never  Substance Use Topics   Alcohol use: No   Drug use: No  Patient lives with her 34 year old daughter.  Works as a Furniture conservator/restorer at [employer], works 7 days a week.  Has been unable to work since Friday since she cannot ambulate due to the leg swelling.  She denies all current substance use.   Review of Systems: A complete ROS was negative except as per HPI.   Physical Exam: Blood pressure 110/61, pulse (!) 110, temperature 98.8 F (37.1 C), temperature source Oral, resp. rate 18, height 5\' 7"  (1.702 m), weight 121.2 kg, SpO2 95 %. Physical Exam: General: Well appearing obese Caucasian female, NAD HENT: normocephalic, atraumatic, MMM EYES: conjunctiva non-erythematous, no scleral icterus CV: Tachycardic, normal rhythm, no murmurs, rubs, gallops.  2+ pitting edema left foot to mid shin.  Palpable pedal pulses. Pulmonary: normal work of breathing on RA, lungs clear to auscultation, no rales, wheezes, rhonchi Abdominal: non-distended, soft, non-tender to palpation, normal BS Skin: Warm and dry, poorly demarcated erythema dorsum of foot to mid shin (see photo media tab). Neurological: MS: awake, alert and oriented x3, normal speech and fund of knowledge Motor: moves all extremities antigravity Psych: normal affect  CBC Latest Ref Rng & Units 02/18/2021 02/17/2021 02/16/2021  WBC 4.0 - 10.5 K/uL 6.7 8.6 7.1  Hemoglobin 12.0 - 15.0 g/dL 10.3(L) 10.8(L) 11.9(L)  Hematocrit 36.0 - 46.0 % 29.7(L) 31.7(L) 34.7(L)  Platelets 150 - 400 K/uL 158 175 174   CMP     Component Value Date/Time   NA 128 (L) 02/18/2021 0131   K 2.8 (L) 02/18/2021  0131   CL 95 (L) 02/18/2021 0131   CO2 19 (L) 02/18/2021 0131   GLUCOSE 382 (H) 02/18/2021 0131   BUN 10 02/18/2021 0131   CREATININE 0.56 02/18/2021 0131   CALCIUM 8.1 (L) 02/18/2021 0131   PROT 7.0 02/16/2021 1745   ALBUMIN 2.4 (L) 02/16/2021 1745   AST 27 02/16/2021 1745   ALT 27 02/16/2021 1745   ALKPHOS 113 02/16/2021 1745   BILITOT 1.4 (H) 02/16/2021 1745   GFRNONAA >60 02/18/2021 0131   GFRAA >60 05/19/2018 1314    LEFT FOOT - COMPLETE 3+ VIEW IMPRESSION: 1. No acute fracture or dislocation. 2. Diffuse soft tissue swelling and subcutaneous edema of the dorsum of the foot. Findings may represent cellulitis. Clinical correlation is recommended. 3. Irregularity of the dorsal cortex of a tarsal bone, likely medial cuneiform. Electronically Signed   By: Anner Crete M.D.   On: 02/17/2021 19:48   Assessment &  Plan by Problem: Active Problems:   Staphylococcus aureus bacteremia  #Cellulitis of left lower extremity #MSSA bacteremia Patient presents for 1 month history of worsening left lower extremity swelling, erythema, intermittent pain, now unable to ambulate.  Clinical presentation consistent with cellulitis.  Blood cultures from 11/8 grew MSSA.  Patient will be admitted for IV antibiotic treatment for left lower extremity cellulitis.  Currently patient afebrile, without leukocytosis.  Status post dose of Ancef, 1 L fluid bolus in ED. Plan: -Ancef every 8 hours -Repeat blood cultures in the morning and every 24-48 hours after until clearance -Echo to rule out endocarditis -If echo negative and patient meets criteria for uncomplicated staph aureus bacteremia, she will need 14 days duration antibiotic therapy -Ibuprofen for pain  #Concern for mild DKA #Poorly controlled type 2 diabetes mellitus #Hyperglycemia to the 300s #Anion gap metabolic acidosis Glucose 335 on arrival, with anion gap 18, and bicarb 17. Given acute infection, patient is at risk for developing  DKA.  Most recent documented hemoglobin A1c 02/15/2021 urgent care note in care everywhere 13.7%. UA with glucose greater than 500, + ketones, + protein.  Repeat BMP with improvement in anion gap to 14.  Beta hydroxybutyrate 2.68.  Patient feels well without nausea or vomiting at this time. Plan: -Levemir 100 units nightly -Once Novolog 20u -Sliding scale insulin with CBGs -LR 1 L bolus plus maintenance fluids with potassium -Tradjenta 5 mg daily -Repeat BMP in 8 a.m.   #Moderate hypokalemia #Hyperglycemia induced hyponatremia Sodium 128, potassium 2.7.  Corrected sodium 132. Normal kidney function.  Electrolyte abnormalities likely secondary to osmotic diuresis from hyperglycemia.  EKG sinus tachycardia. Plan: -Electrolyte repletion as needed -Hyperglycemic control as above -Follow-up EKG: Sinus tachycardia  #Beta hCG blood 11.3 Levels between 6-24 inconclusive vs early pregnancy vs menopause, menopausal and perimenopausal patients sometimes have raised serum levels of hCG; levels up to 25 international units/L have been reported, but levels around 7 to 15 international units/L are more common.  Plan: -Obtain sexual history/menstrual history in the a.m. -Repeat testing -Holding teratogenic medications  #HTN #HLD Patient normotensive.  Last lipid panel 08/2020 with total cholesterol 242, triglycerides 346, HDL 46, LDL 134. Plan: -Holding pravastatin 20 mg daily -Holding losartan 25 mg daily  #Normocytic anemia Asymptomatic.  Stable.  Baseline 10-11.  Patient follows with Nena Polio, NP.  No documentation of anemia diagnosis or work-up per chart review.  Patient on B12 supplementation however no record visible of B12 deficiency. Patient has been anemic since 2008. Plan: -Iron panel, ferritin, folate, B12 in the AM  #Other home medications continued this admission: Vitamin B12 500 MCG daily, Celexa 40 mg daily  Diet: Carb modified VTE: Lovenox IVF: None Code: Full  Dispo:  Admit patient to Inpatient with expected length of stay greater than 2 midnights.  Signed: Wayland Denis, MD 02/18/2021, 2:59 AM  PagerMB:[phone] After 5pm on weekdays and 1pm on weekends: On Call pager: [phone]

## 2021-02-17 NOTE — ED Notes (Signed)
Peripheral IV attempted x2 RN's. IV team called.

## 2021-02-17 NOTE — Telephone Encounter (Signed)
This RN was made aware of patients positive blood culture by Microbiology. Per Stevie Kern MD patient called and advised to follow up back at the ED. Spoke with patient on the phone who agrees and states she will go back to be reevaluated.

## 2021-02-17 NOTE — ED Provider Notes (Signed)
Emergency Medicine Provider Triage Evaluation Note  Lori Schmidt , a 45 y.o. female  was evaluated in triage.  Pt complains of left leg cellulitis. Patient was seen last night at [facility] where she presented with left foot swelling and redness that has been worsening over the past month.  She was discharged home with pending blood cultures.  Today she found out that blood cultures were positive for staph aureus.  She was discharged home on Keflex.  She was called today and told return the emergency department.   Review of Systems  Positive: Left leg swelling and redness Negative:   Physical Exam  BP 126/81 (BP Location: Right Arm)   Pulse (!) 113   Temp 99.2 F (37.3 C) (Oral)   Resp 19   SpO2 99%  Gen:   Awake, no distress   Resp:  Normal effort  MSK:   Moves extremities without difficulty  Other:  Left leg swelling and erythema.  4+ pitting edema.  Pulses palpated 2+ bilaterally.  Neurovascularly intact.  Medical Decision Making  Medically screening exam initiated at 7:05 PM.  Appropriate orders placed.  Lori Schmidt was informed that the remainder of the evaluation will be completed by another provider, this initial triage assessment does not replace that evaluation, and the importance of remaining in the ED until their evaluation is complete.  Labs and x-ray ordered.  Blood cultures are positive for staph aureus.  Will need antibiotics in the back.   Therese Sarah 02/17/21 Ocie Cornfield, MD 02/17/21 [phone]

## 2021-02-18 ENCOUNTER — Inpatient Hospital Stay (HOSPITAL_COMMUNITY): Payer: Managed Care, Other (non HMO)

## 2021-02-18 ENCOUNTER — Encounter (HOSPITAL_COMMUNITY): Payer: Self-pay | Admitting: Internal Medicine

## 2021-02-18 DIAGNOSIS — E1165 Type 2 diabetes mellitus with hyperglycemia: Secondary | ICD-10-CM

## 2021-02-18 DIAGNOSIS — R55 Syncope and collapse: Secondary | ICD-10-CM | POA: Diagnosis not present

## 2021-02-18 DIAGNOSIS — R7881 Bacteremia: Secondary | ICD-10-CM | POA: Diagnosis not present

## 2021-02-18 DIAGNOSIS — E119 Type 2 diabetes mellitus without complications: Secondary | ICD-10-CM

## 2021-02-18 DIAGNOSIS — E1169 Type 2 diabetes mellitus with other specified complication: Principal | ICD-10-CM

## 2021-02-18 DIAGNOSIS — B9561 Methicillin susceptible Staphylococcus aureus infection as the cause of diseases classified elsewhere: Secondary | ICD-10-CM

## 2021-02-18 DIAGNOSIS — M869 Osteomyelitis, unspecified: Secondary | ICD-10-CM

## 2021-02-18 DIAGNOSIS — L03116 Cellulitis of left lower limb: Secondary | ICD-10-CM

## 2021-02-18 DIAGNOSIS — A4101 Sepsis due to Methicillin susceptible Staphylococcus aureus: Secondary | ICD-10-CM

## 2021-02-18 DIAGNOSIS — M00072 Staphylococcal arthritis, left ankle and foot: Secondary | ICD-10-CM

## 2021-02-18 LAB — CBC
HCT: 29.7 % — ABNORMAL LOW (ref 36.0–46.0)
Hemoglobin: 10.3 g/dL — ABNORMAL LOW (ref 12.0–15.0)
MCH: 28.2 pg (ref 26.0–34.0)
MCHC: 34.7 g/dL (ref 30.0–36.0)
MCV: 81.4 fL (ref 80.0–100.0)
Platelets: 158 10*3/uL (ref 150–400)
RBC: 3.65 MIL/uL — ABNORMAL LOW (ref 3.87–5.11)
RDW: 12.5 % (ref 11.5–15.5)
WBC: 6.7 10*3/uL (ref 4.0–10.5)
nRBC: 0 % (ref 0.0–0.2)

## 2021-02-18 LAB — URINALYSIS, ROUTINE W REFLEX MICROSCOPIC
Bilirubin Urine: NEGATIVE
Glucose, UA: >=500 mg/dL — AB
Ketones, ur: 80 mg/dL — AB
Leukocytes,Ua: NEGATIVE
Nitrite: NEGATIVE
Protein, ur: 30 mg/dL — AB
Specific Gravity, Urine: 1.024 (ref 1.005–1.030)
pH: 6 (ref 5.0–8.0)

## 2021-02-18 LAB — BASIC METABOLIC PANEL
Anion gap: 14 (ref 5–15)
Anion gap: 11 (ref 5–15)
Anion gap: 10 (ref 5–15)
Anion gap: 10 (ref 5–15)
BUN: 10 mg/dL (ref 6–20)
BUN: 9 mg/dL (ref 6–20)
BUN: 8 mg/dL (ref 6–20)
BUN: 10 mg/dL (ref 6–20)
CO2: 19 mmol/L — ABNORMAL LOW (ref 22–32)
CO2: 21 mmol/L — ABNORMAL LOW (ref 22–32)
CO2: 24 mmol/L (ref 22–32)
CO2: 22 mmol/L (ref 22–32)
Calcium: 8.1 mg/dL — ABNORMAL LOW (ref 8.9–10.3)
Calcium: 8 mg/dL — ABNORMAL LOW (ref 8.9–10.3)
Calcium: 8.2 mg/dL — ABNORMAL LOW (ref 8.9–10.3)
Calcium: 8.2 mg/dL — ABNORMAL LOW (ref 8.9–10.3)
Chloride: 95 mmol/L — ABNORMAL LOW (ref 98–111)
Chloride: 99 mmol/L (ref 98–111)
Chloride: 100 mmol/L (ref 98–111)
Chloride: 98 mmol/L (ref 98–111)
Creatinine, Ser: 0.56 mg/dL (ref 0.44–1.00)
Creatinine, Ser: 0.43 mg/dL — ABNORMAL LOW (ref 0.44–1.00)
Creatinine, Ser: 0.53 mg/dL (ref 0.44–1.00)
Creatinine, Ser: 0.49 mg/dL (ref 0.44–1.00)
GFR, Estimated: >60 mL/min (ref >60)
GFR, Estimated: >60 mL/min (ref >60)
GFR, Estimated: >60 mL/min (ref >60)
GFR, Estimated: >60 mL/min (ref >60)
Glucose, Bld: 382 mg/dL — ABNORMAL HIGH (ref 70–99)
Glucose, Bld: 182 mg/dL — ABNORMAL HIGH (ref 70–99)
Glucose, Bld: 132 mg/dL — ABNORMAL HIGH (ref 70–99)
Glucose, Bld: 92 mg/dL (ref 70–99)
Potassium: 2.8 mmol/L — ABNORMAL LOW (ref 3.5–5.1)
Potassium: 2.8 mmol/L — ABNORMAL LOW (ref 3.5–5.1)
Potassium: 2.7 mmol/L — CL (ref 3.5–5.1)
Potassium: 3.2 mmol/L — ABNORMAL LOW (ref 3.5–5.1)
Sodium: 128 mmol/L — ABNORMAL LOW (ref 135–145)
Sodium: 131 mmol/L — ABNORMAL LOW (ref 135–145)
Sodium: 134 mmol/L — ABNORMAL LOW (ref 135–145)
Sodium: 130 mmol/L — ABNORMAL LOW (ref 135–145)

## 2021-02-18 LAB — GLUCOSE, CAPILLARY
Glucose-Capillary: 137 mg/dL — ABNORMAL HIGH (ref 70–99)
Glucose-Capillary: 165 mg/dL — ABNORMAL HIGH (ref 70–99)
Glucose-Capillary: 169 mg/dL — ABNORMAL HIGH (ref 70–99)
Glucose-Capillary: 299 mg/dL — ABNORMAL HIGH (ref 70–99)
Glucose-Capillary: 328 mg/dL — ABNORMAL HIGH (ref 70–99)
Glucose-Capillary: 361 mg/dL — ABNORMAL HIGH (ref 70–99)
Glucose-Capillary: 93 mg/dL (ref 70–99)

## 2021-02-18 LAB — RAPID URINE DRUG SCREEN, HOSP PERFORMED
Amphetamines: NOT DETECTED
Barbiturates: NOT DETECTED
Benzodiazepines: NOT DETECTED
Cocaine: NOT DETECTED
Opiates: NOT DETECTED
Tetrahydrocannabinol: NOT DETECTED

## 2021-02-18 LAB — BETA-HYDROXYBUTYRIC ACID
Beta-Hydroxybutyric Acid: 2.68 mmol/L — ABNORMAL HIGH (ref 0.05–0.27)
Beta-Hydroxybutyric Acid: 0.51 mmol/L — ABNORMAL HIGH (ref 0.05–0.27)
Beta-Hydroxybutyric Acid: 0.32 mmol/L — ABNORMAL HIGH (ref 0.05–0.27)
Beta-Hydroxybutyric Acid: 0.31 mmol/L — ABNORMAL HIGH (ref 0.05–0.27)

## 2021-02-18 LAB — ECHOCARDIOGRAM COMPLETE
AR max vel: 2.85 cm2
AV Area VTI: 2.83 cm2
AV Area mean vel: 2.83 cm2
AV Mean grad: 4 mmHg
AV Peak grad: 7.4 mmHg
Ao pk vel: 1.36 m/s
Area-P 1/2: 4.63 cm2
Height: 67 in
S' Lateral: 3.4 cm
Weight: 4275.16 oz

## 2021-02-18 LAB — MAGNESIUM: Magnesium: 2 mg/dL (ref 1.7–2.4)

## 2021-02-18 LAB — IRON AND TIBC
Iron: 16 ug/dL — ABNORMAL LOW (ref 28–170)
Saturation Ratios: 8 % — ABNORMAL LOW (ref 10.4–31.8)
TIBC: 207 ug/dL — ABNORMAL LOW (ref 250–450)
UIBC: 191 ug/dL

## 2021-02-18 LAB — FOLATE: Folate: 6.9 ng/mL (ref >5.9)

## 2021-02-18 LAB — VITAMIN B12: Vitamin B-12: 4289 pg/mL — ABNORMAL HIGH (ref 180–914)

## 2021-02-18 LAB — HIV ANTIBODY (ROUTINE TESTING W REFLEX): HIV Screen 4th Generation wRfx: NONREACTIVE

## 2021-02-18 LAB — FERRITIN: Ferritin: 1607 ng/mL — ABNORMAL HIGH (ref 11–307)

## 2021-02-18 IMAGING — MR MR [PERSON_NAME] LOW WO/W CM*L*
3 of 17 series · 6 of 40 positions shown · IV contrast (gadavist)
Comparison: None.

CLINICAL DATA: Diabetic foot swelling.  Lower extremity swelling

EXAM:
MRI OF LOWER LEFT EXTREMITY WITHOUT AND WITH CONTRAST
TECHNIQUE: Multiplanar, multisequence MR imaging of the left tibia and fibula
was performed both before and after administration of intravenous
contrast.
CONTRAST:  10mL GADAVIST GADOBUTROL 1 MMOL/ML IV SOLN

[Series 3: T1 · coronal · 5.0mm · 0.43mm/px · 3 of 26 slices shown (1 of 2)]
[im 1/26]
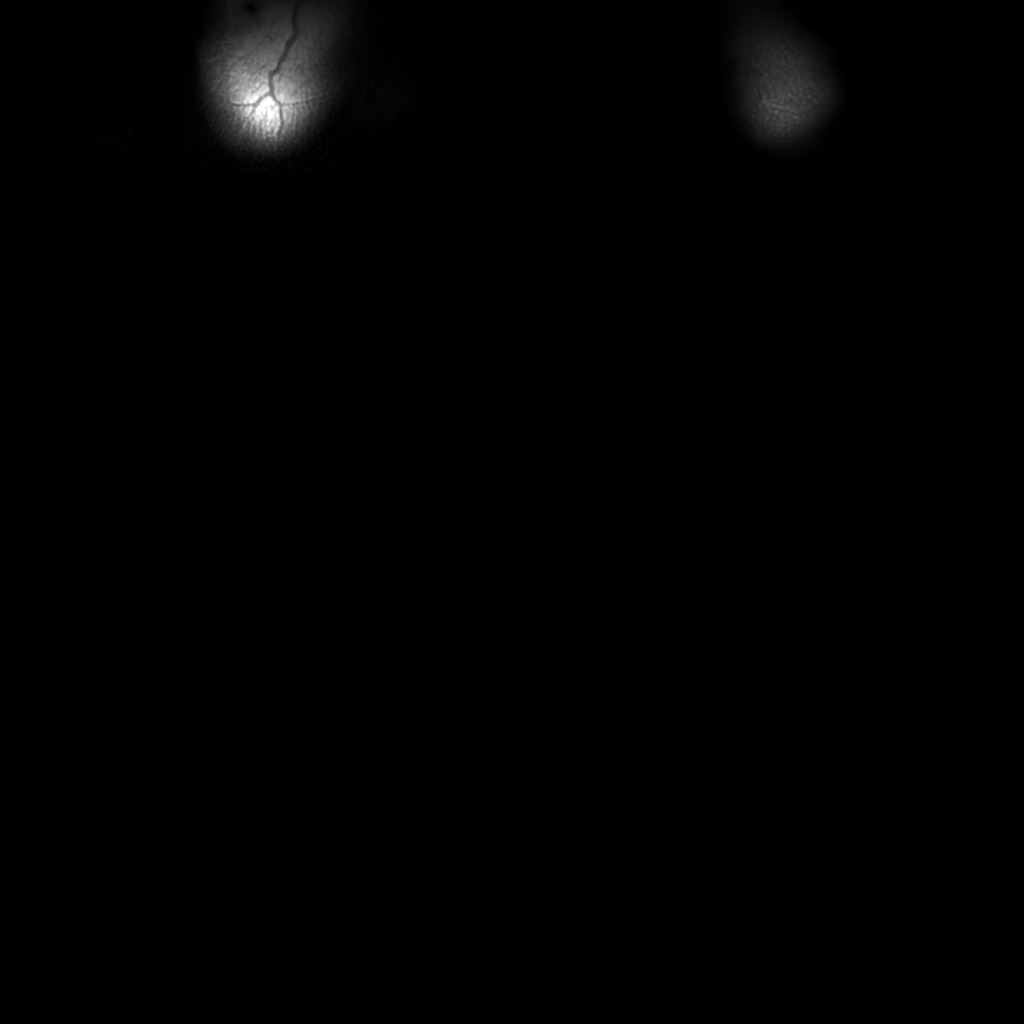
[im 13/26]
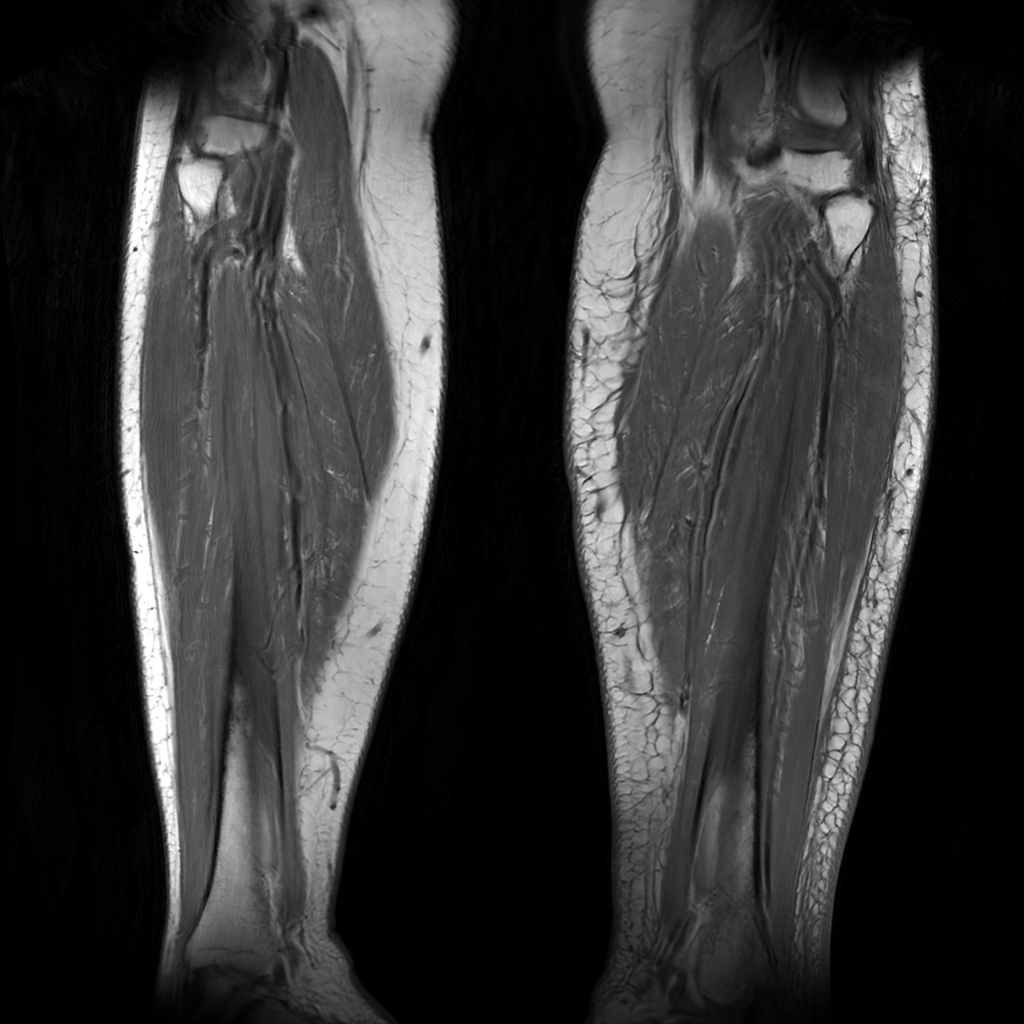
[im 26/26]
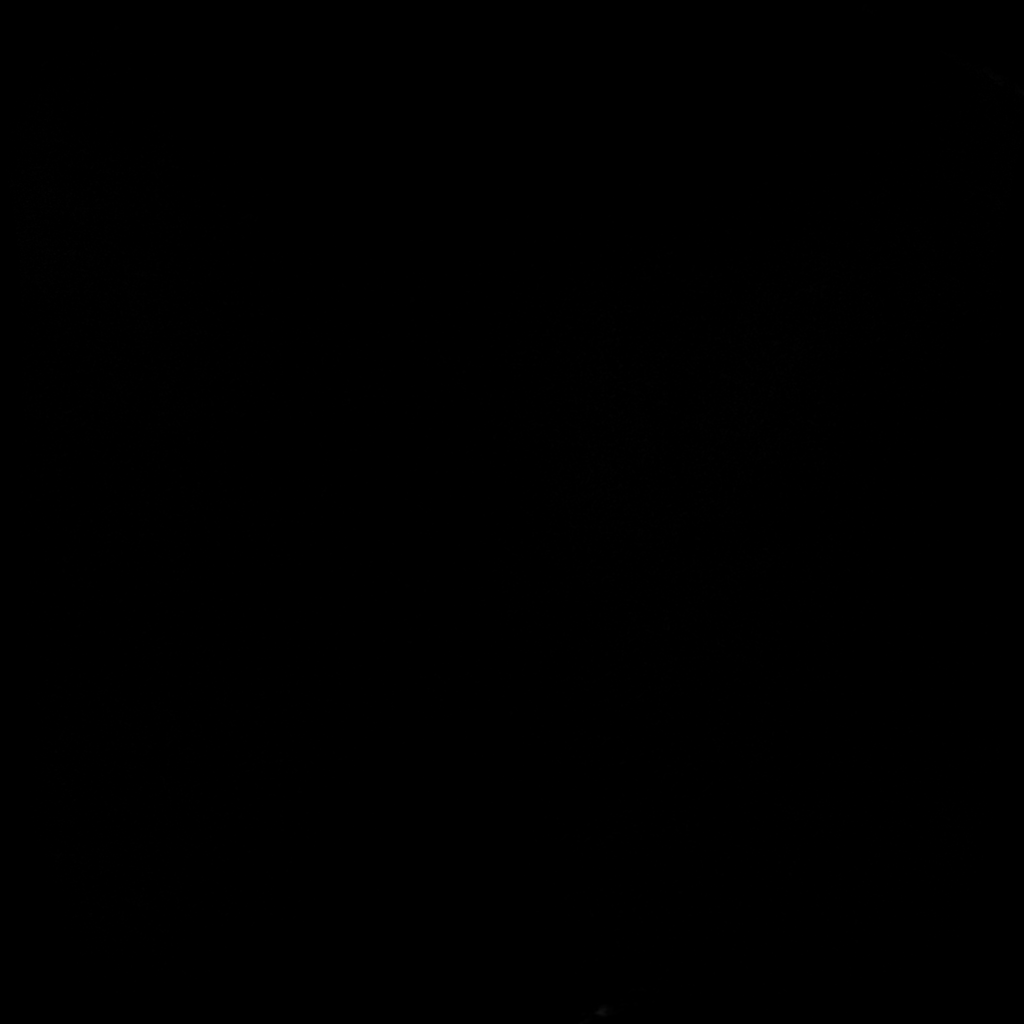

[Series 5: T1 · axial · 5.0mm · 0.25mm/px · 1 of 70 slices shown (2 of 2)]
[im 1/70]
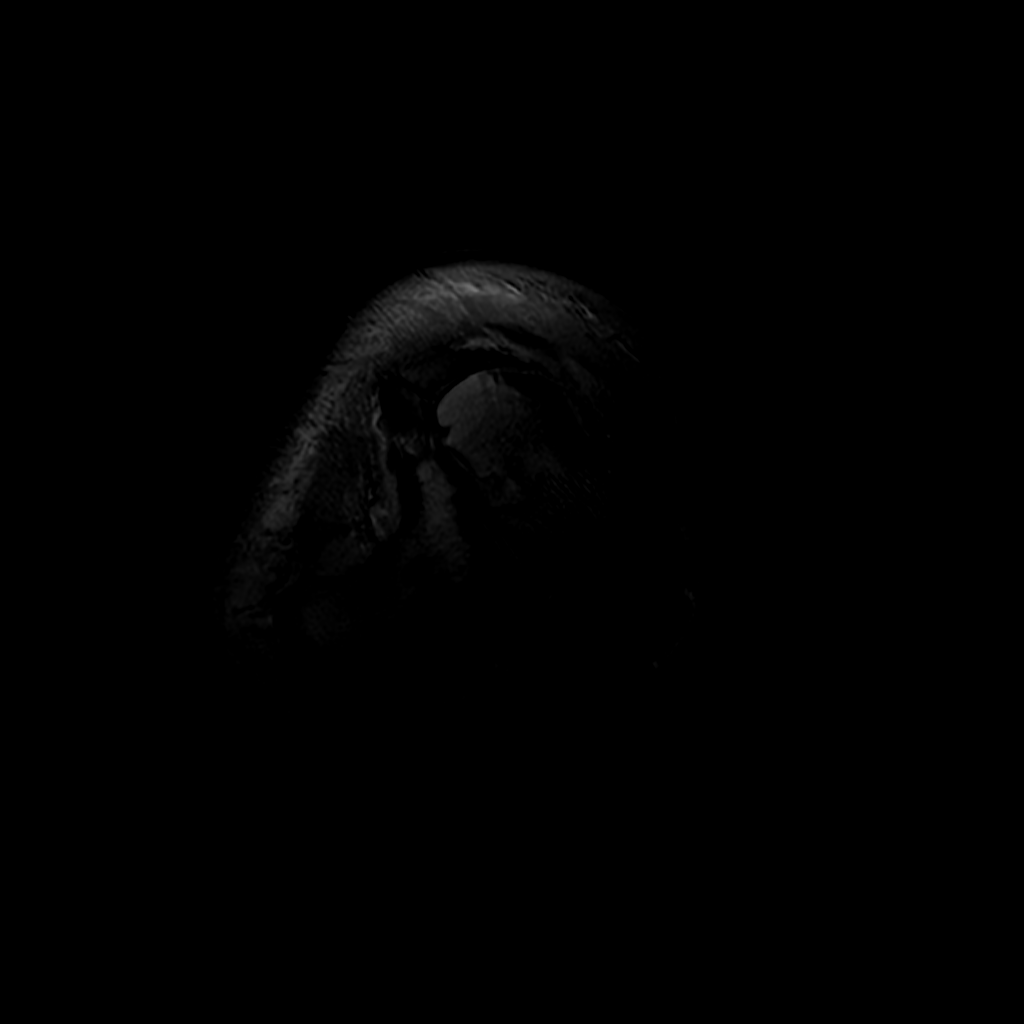

[Series 14: T2 fat-sat · axial · 5.0mm · 0.27mm/px · z∈[-247,-81]mm · 2 of 35 slices shown]
[im 1/35]
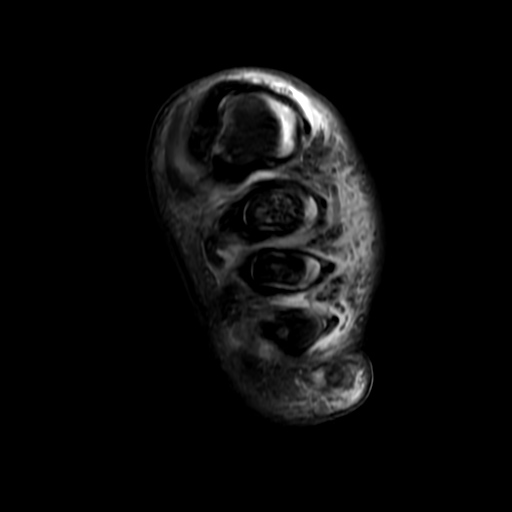
[im 35/35]
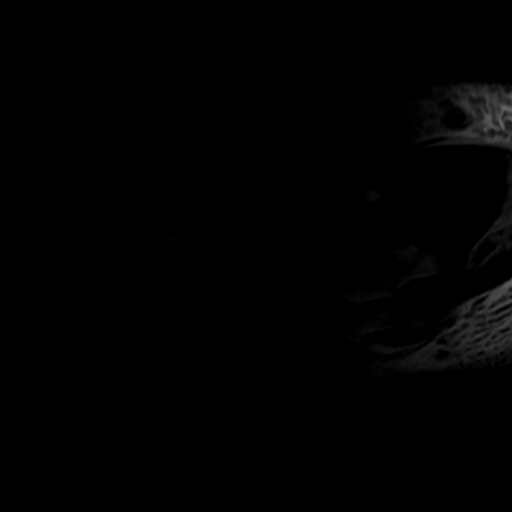

[6 of 40 positions shown; findings below may reference images not displayed]

FINDINGS: Bones/Joint/Cartilage

No acute fracture or dislocation of the left tibia or fibula. No
erosion or site of bone destruction. No bone marrow edema. No
periosteal elevation.

Ligaments

Grossly intact.

Muscles and Tendons

Small volume tenosynovial fluid collections associated with the
peroneal tendons, tibialis posterior tendon, as well as the flexor
hallucis longus and flexor digitorum longus tendons at the posterior
aspect of the ankle. Mild edema-like signal within the anterior and
posterior compartment musculature of the lower leg. No intramuscular
fluid collection. Small amount of deep fascial fluid and edema with
enhancement throughout the anterior and posterior compartments of
the lower leg. No rim enhancing deep fascial fluid collection.

Soft tissues

Diffuse circumferential subcutaneous edema and soft tissue
enhancement. No organized or drainable fluid collection within the
lower leg.
IMPRESSION: 1. Findings of diffuse cellulitis and myofasciitis throughout the
left lower leg. No organized or rim-enhancing fluid collections.
2. Small volume tenosynovial fluid collections associated with the
peroneal tendons, tibialis posterior tendon, as well as the flexor
hallucis longus and flexor digitorum longus tendons at the posterior
aspect of the ankle suggesting mild tenosynovitis, which could be
reactive or infectious.
3. No evidence of osteomyelitis of the left tibia or fibula.

## 2021-02-18 IMAGING — MR MR FOOT*L* WO/W CM
4 of 9 series · 18 of 40 positions shown · IV contrast (gadavist)
Comparison: X-ray [DATE]

CLINICAL DATA: Diabetic foot swelling.

EXAM:
MRI OF THE LEFT FOOT WITHOUT AND WITH CONTRAST
TECHNIQUE: Multiplanar, multisequence MR imaging of the left foot was performed
both before and after administration of intravenous contrast.
CONTRAST:  10mL GADAVIST GADOBUTROL 1 MMOL/ML IV SOLN

[Series 12: T1 · axial · 5.0mm · 0.27mm/px · z∈[-247,-81]mm · 5 of 35 slices shown (1 of 2)]
[im 1/35]
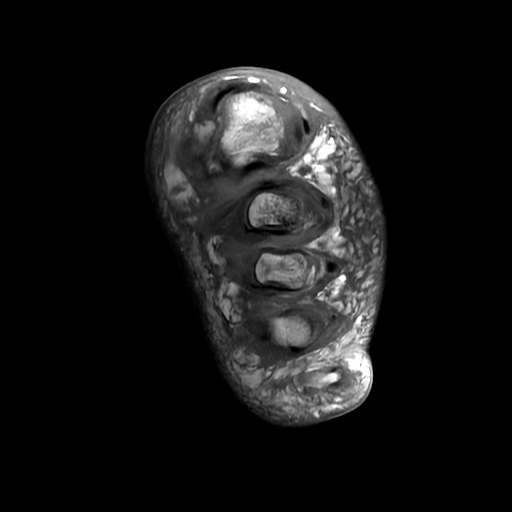
[im 9/35]
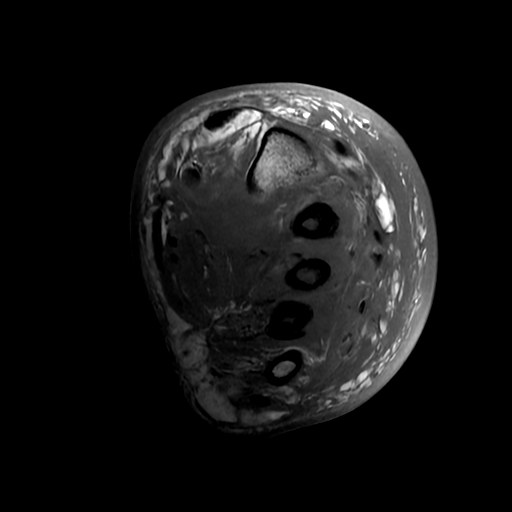
[im 18/35]
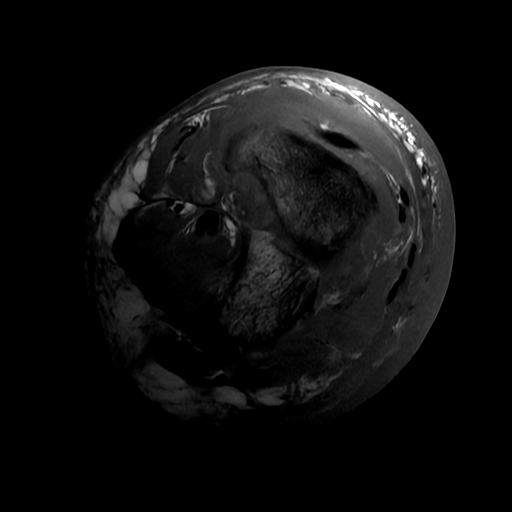
[im 26/35]
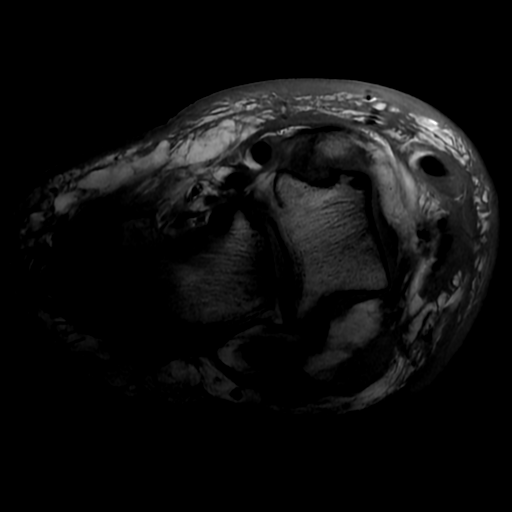
[im 35/35]
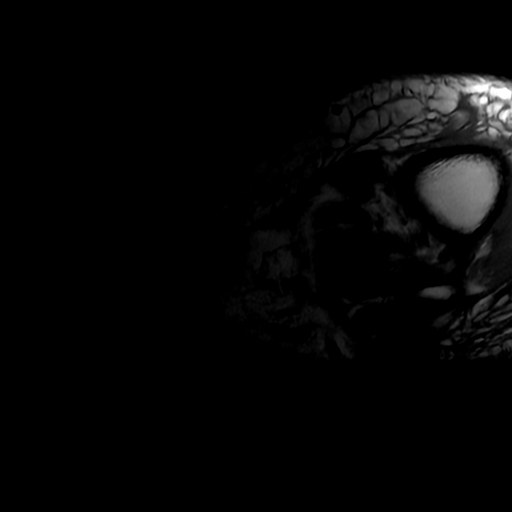

[Series 13: T1 fat-sat · axial · non-contrast · 5.0mm · 0.27mm/px · z∈[-247,-81]mm · 5 of 35 slices shown]
[im 1/35]
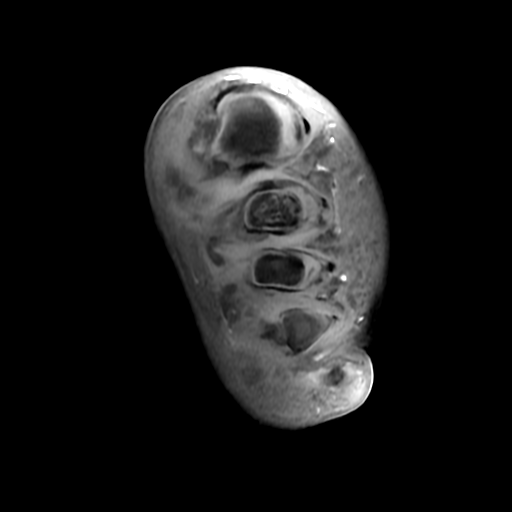
[im 9/35]
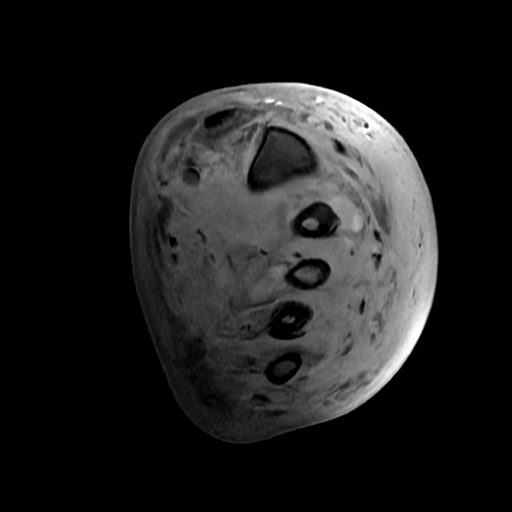
[im 18/35]
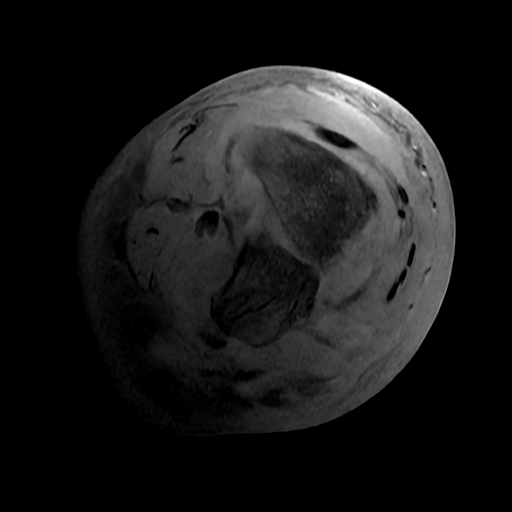
[im 26/35]
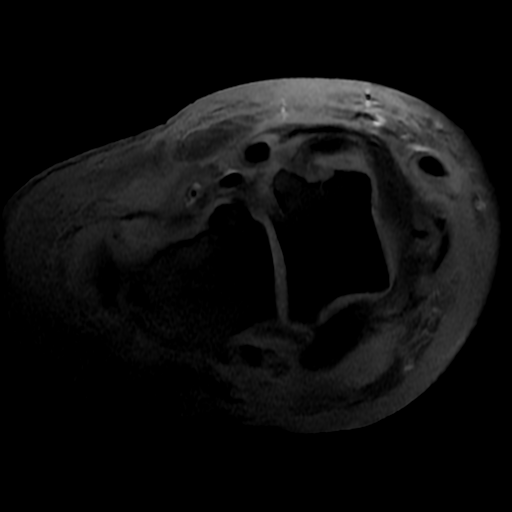
[im 35/35]
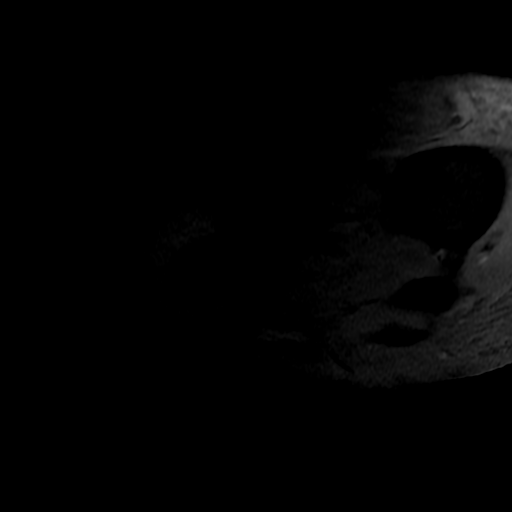

[Series 14: T2 fat-sat · axial · 5.0mm · 0.27mm/px · z∈[-247,-81]mm · 5 of 35 slices shown]
[im 1/35]
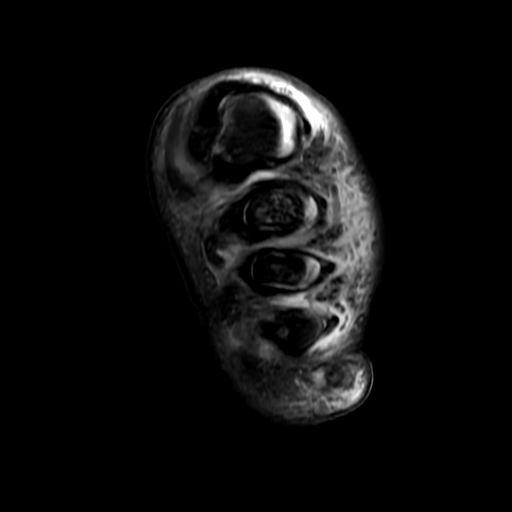
[im 9/35]
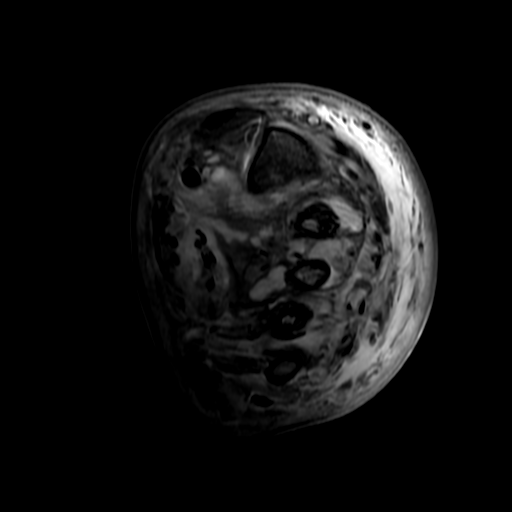
[im 18/35]
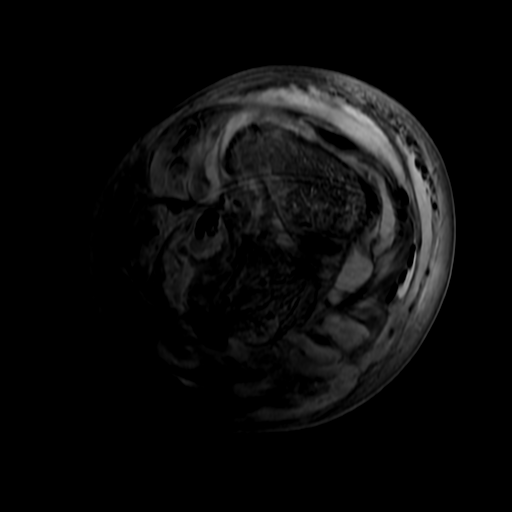
[im 26/35]
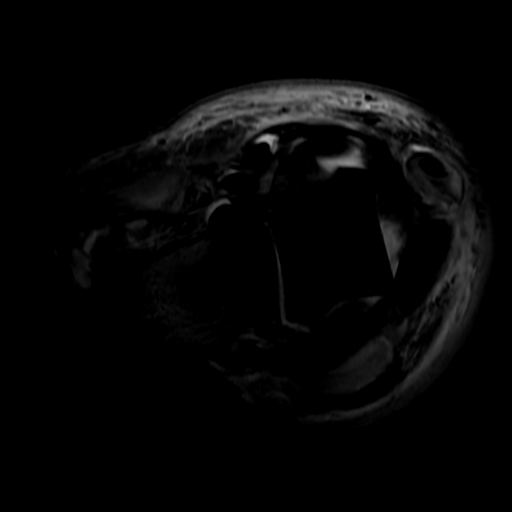
[im 35/35]
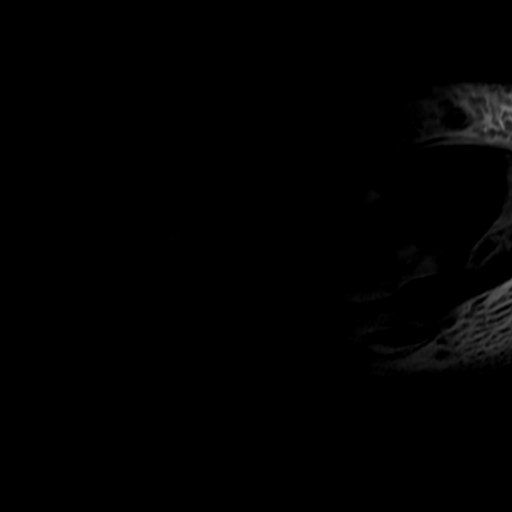

[Series 16: T1 · oblique · 4.0mm · 0.39mm/px · 3 of 24 slices shown (2 of 2)]
[im 1/24]
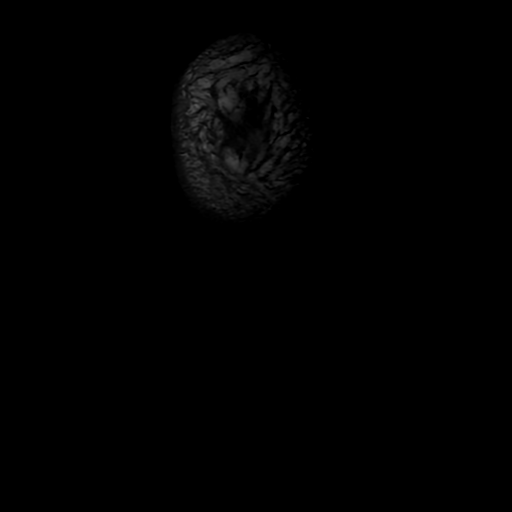
[im 16/24]
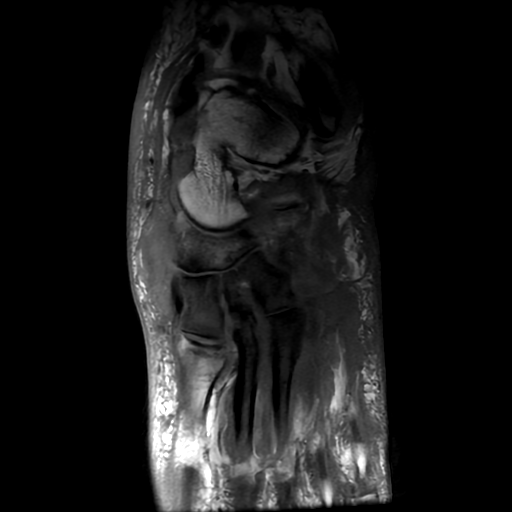
[im 24/24]
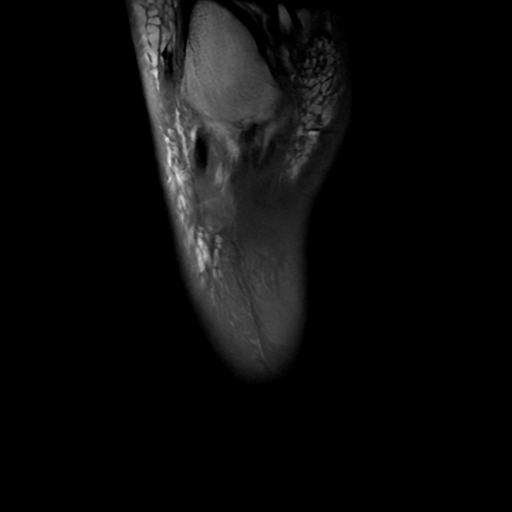

[18 of 40 positions shown; findings below may reference images not displayed]

FINDINGS: Bones/Joint/Cartilage

Markedly abnormal appearance of the osseous structures of the left
foot with extensive bone marrow edema and enhancement. Extensive
confluent low T1 marrow signal changes involving multiple bones,
most notably the second, third, and fourth metatarsals as well as
the medial, intermediate, and lateral cuneiform bones and the
calcaneus and navicular bones. Less prominent signal changes are
also present in the cuboid and fifth metatarsal base as well as the
talar neck. Bony erosive changes, predominantly along the dorsal
aspect of the midfoot. There are extensive joint effusions
throughout the hindfoot and midfoot with peripheral rim enhancement,
most compatible with septic arthritis. Trace tibiotalar and subtalar
joint effusions are nonspecific although septic arthritis is
suspected.

Ligaments

No definite acute ligamentous abnormality.

Muscles and Tendons

Findings of diffuse myofasciitis and probable myonecrosis throughout
the intrinsic foot musculature. Extensive tenosynovial fluid
collections involving the flexor and extensor tendons of the foot.

Soft tissues

Marked circumferential soft tissue swelling. Lobulated fluid
collections throughout the left foot, most of which are contiguous
with adjacent joint spaces suggesting septic arthritis and
associated soft tissue abscesses.
IMPRESSION: 1. Extensive acute osteomyelitis throughout the left hindfoot and
forefoot with multifocal bony involvement, as detailed above.
2. Numerous peripherally enhancing joint effusions throughout the
left midfoot, many of which are contiguous with rim enhancing fluid
collections in the adjacent soft tissues. Findings are compatible
with multifocal septic arthritis and adjacent abscesses.
3. Extensive tenosynovial fluid collections throughout the flexor
and extensor tendons of the left foot, compatible with infectious
tenosynovitis.
4. Small tibiotalar and subtalar joint effusions, may be reactive or
reflect septic arthritis.
5. Marked diffuse cellulitis of the left foot.

These results will be called to the ordering clinician or
representative by the Radiologist Assistant, and communication
documented in the PACS or [REDACTED].

## 2021-02-18 MED ORDER — POTASSIUM CHLORIDE 10 MEQ/100ML IV SOLN
10.0000 meq | INTRAVENOUS | Status: AC
  Administered 2021-02-18 (×4): 10 meq via INTRAVENOUS
  Filled 2021-02-18 (×4): qty 100

## 2021-02-18 MED ORDER — INSULIN DETEMIR 100 UNIT/ML ~~LOC~~ SOLN
50.0000 [IU] | Freq: Two times a day (BID) | SUBCUTANEOUS | Status: DC
Start: 2021-02-19 — End: 2021-02-23
  Administered 2021-02-19 – 2021-02-23 (×8): 50 [IU] via SUBCUTANEOUS
  Filled 2021-02-18 (×10): qty 0.5

## 2021-02-18 MED ORDER — INSULIN DETEMIR 100 UNIT/ML ~~LOC~~ SOLN
50.0000 [IU] | Freq: Once | SUBCUTANEOUS | Status: DC
  Filled 2021-02-18: qty 0.5

## 2021-02-18 MED ORDER — INSULIN ASPART 100 UNIT/ML IJ SOLN
20.0000 [IU] | Freq: Once | INTRAMUSCULAR | Status: AC
  Administered 2021-02-18: 20 [IU] via SUBCUTANEOUS

## 2021-02-18 MED ORDER — POTASSIUM CHLORIDE 2 MEQ/ML IV SOLN
INTRAVENOUS | Status: AC
  Filled 2021-02-18: qty 1000

## 2021-02-18 MED ORDER — LACTATED RINGERS IV SOLN: INTRAVENOUS | Status: DC

## 2021-02-18 MED ORDER — INSULIN DETEMIR 100 UNIT/ML ~~LOC~~ SOLN
50.0000 [IU] | Freq: Two times a day (BID) | SUBCUTANEOUS | Status: DC
  Administered 2021-02-18: 50 [IU] via SUBCUTANEOUS
  Filled 2021-02-18 (×2): qty 0.5

## 2021-02-18 MED ORDER — POTASSIUM CHLORIDE 20 MEQ PO PACK
40.0000 meq | PACK | Freq: Two times a day (BID) | ORAL | Status: DC
  Administered 2021-02-18: 40 meq via ORAL
  Filled 2021-02-18: qty 2

## 2021-02-18 MED ORDER — POTASSIUM CHLORIDE 20 MEQ PO PACK: 40.0000 meq | PACK | ORAL | Status: DC

## 2021-02-18 MED ORDER — INSULIN REGULAR(HUMAN) IN NACL 100-0.9 UT/100ML-% IV SOLN: INTRAVENOUS | Status: DC

## 2021-02-18 MED ORDER — INSULIN DETEMIR 100 UNIT/ML ~~LOC~~ SOLN: 100.0000 [IU] | Freq: Every day | SUBCUTANEOUS | Status: DC

## 2021-02-18 MED ORDER — POTASSIUM CHLORIDE 20 MEQ PO PACK
40.0000 meq | PACK | ORAL | Status: AC
  Administered 2021-02-18 (×2): 40 meq via ORAL
  Filled 2021-02-18 (×2): qty 2

## 2021-02-18 MED ORDER — KCL-LACTATED RINGERS 20 MEQ/L IV SOLN
INTRAVENOUS | Status: DC
  Filled 2021-02-18 (×2): qty 1000

## 2021-02-18 MED ORDER — POTASSIUM CHLORIDE 2 MEQ/ML IV SOLN
INTRAVENOUS | Status: DC
  Filled 2021-02-18: qty 1000

## 2021-02-18 MED ORDER — INSULIN ASPART 100 UNIT/ML IJ SOLN
0.0000 [IU] | Freq: Three times a day (TID) | INTRAMUSCULAR | Status: DC
  Administered 2021-02-18 (×2): 4 [IU] via SUBCUTANEOUS
  Administered 2021-02-18: 3 [IU] via SUBCUTANEOUS
  Administered 2021-02-20 (×2): 7 [IU] via SUBCUTANEOUS
  Administered 2021-02-22: 3 [IU] via SUBCUTANEOUS
  Administered 2021-02-24: 7 [IU] via SUBCUTANEOUS
  Administered 2021-02-24 – 2021-02-27 (×6): 4 [IU] via SUBCUTANEOUS
  Administered 2021-02-27 – 2021-02-28 (×2): 3 [IU] via SUBCUTANEOUS
  Administered 2021-03-01: 4 [IU] via SUBCUTANEOUS
  Administered 2021-03-02 – 2021-03-03 (×2): 3 [IU] via SUBCUTANEOUS
  Administered 2021-03-03 – 2021-03-04 (×2): 4 [IU] via SUBCUTANEOUS
  Administered 2021-03-05: 3 [IU] via SUBCUTANEOUS

## 2021-02-18 MED ORDER — INSULIN DETEMIR 100 UNIT/ML ~~LOC~~ SOLN
50.0000 [IU] | Freq: Once | SUBCUTANEOUS | Status: AC
  Administered 2021-02-18: 50 [IU] via SUBCUTANEOUS
  Filled 2021-02-18: qty 0.5

## 2021-02-18 MED ORDER — INSULIN ASPART 100 UNIT/ML IJ SOLN
0.0000 [IU] | Freq: Every day | INTRAMUSCULAR | Status: DC
  Administered 2021-02-19: 3 [IU] via SUBCUTANEOUS
  Administered 2021-02-25: 22:00:00 2 [IU] via SUBCUTANEOUS

## 2021-02-18 MED ORDER — OXYCODONE HCL 5 MG PO TABS
5.0000 mg | ORAL_TABLET | ORAL | Status: DC | PRN
  Administered 2021-02-18 – 2021-02-19 (×5): 5 mg via ORAL
  Filled 2021-02-18 (×5): qty 1

## 2021-02-18 MED ORDER — INSULIN ASPART 100 UNIT/ML IJ SOLN
5.0000 [IU] | Freq: Three times a day (TID) | INTRAMUSCULAR | Status: DC
Start: 2021-02-18 — End: 2021-02-19

## 2021-02-18 MED ORDER — INSULIN ASPART 100 UNIT/ML IJ SOLN: 0.0000 [IU] | Freq: Three times a day (TID) | INTRAMUSCULAR | Status: DC

## 2021-02-18 MED ORDER — DEXTROSE IN LACTATED RINGERS 5 % IV SOLN: INTRAVENOUS | Status: DC

## 2021-02-18 MED ORDER — VITAMIN B-12 1000 MCG PO TABS
500.0000 ug | ORAL_TABLET | ORAL | Status: DC
Start: 2021-02-20 — End: 2021-03-05
  Administered 2021-02-20 – 2021-03-04 (×7): 500 ug via ORAL
  Filled 2021-02-18 (×11): qty 1

## 2021-02-18 MED ORDER — POTASSIUM CHLORIDE 2 MEQ/ML IV SOLN
Freq: Once | INTRAVENOUS | Status: AC
  Filled 2021-02-18: qty 1000

## 2021-02-18 MED ORDER — POTASSIUM CHLORIDE 10 MEQ/100ML IV SOLN
10.0000 meq | Freq: Once | INTRAVENOUS | Status: AC
Start: 2021-02-18 — End: 2021-02-18
  Administered 2021-02-18: 10 meq via INTRAVENOUS
  Filled 2021-02-18: qty 100

## 2021-02-18 MED ORDER — INSULIN DETEMIR 100 UNIT/ML ~~LOC~~ SOLN
100.0000 [IU] | Freq: Every day | SUBCUTANEOUS | Status: DC
  Filled 2021-02-18: qty 1

## 2021-02-18 MED ORDER — GADOBUTROL 1 MMOL/ML IV SOLN
10.0000 mL | Freq: Once | INTRAVENOUS | Status: AC | PRN
  Administered 2021-02-18: 10 mL via INTRAVENOUS

## 2021-02-18 MED ORDER — DEXTROSE 50 % IV SOLN: 0.0000 mL | INTRAVENOUS | Status: DC | PRN

## 2021-02-18 NOTE — Consult Note (Signed)
[facility]    Date of Admission:  02/17/2021  Total days of antibiotics 1  Cefazolin 11/09 -           Reason for Consult: MSSA Bacteremia     Referring Provider: Reymundo Poll , MD Primary Care Provider: Primus Bravo, NP   Assessment: Lori Schmidt is a 45 year old female with uncontrolled type 2 diabetes here with left lower extremity cellulitis and MSSA bacteremia.  Agree with obtaining MRI of the left foot and left tib/fib for concern of deeper infection.  Recommend continuing Cefazolin and following blood cultures for clearance of bacteremia.  TTE ordered for evaluation of endocarditis. If TTE is unrevealing recommend obtaining TEE.   Plan: Continue Cefazolin Follow-up results of TTE Follow-up repeat blood cultures, to be drawn tomorrow AM Follow up results of MRI left foot and left tib/fib  Active Problems:   Staphylococcus aureus bacteremia    citalopram  40 mg Oral Daily   enoxaparin (LOVENOX) injection  60 mg Subcutaneous Q24H   insulin aspart  0-20 Units Subcutaneous TID WC   insulin aspart  0-5 Units Subcutaneous QHS   insulin aspart  5 Units Subcutaneous TID WC   insulin detemir  50 Units Subcutaneous BID   linagliptin  5 mg Oral Daily   potassium chloride  40 mEq Oral BID   vitamin B-12  500 mcg Oral Daily    HPI: Lori Schmidt is a 45 y.o. female living with major depressive disorder, severe obesity , HTN, HLD , and uncontrolled Type 2 Diabetes Mellitus on Insulin who presents with unilateral lower extremity swelling. Admitted to IMTS. ID has been consulted for MSSA bacteremia.  Patient has had 1 month of left lower extremity swelling with erythema.  Pain and swelling began in left foot approximately 1 month ago. Patient managing pain with NSAID and soaking the foot. 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 She denies any open wounds or inciting event.  She was seen 11/3 at [facility] and left AMA before completing lower extremity ultrasound for work-up of possible DVT.  For the last week she is felt even worse.  She reports having nausea , vomiting , and decreased appetite.  Patient seen at urgent care on 11/8 and received IM injection of ceftriaxone and started on Keflex and Zofran for nausea.  Patient continued to have pain and presented to [facility], ED on 11/8.  Lower extremity ultrasound negative for DVT and blood cultures were collected.  Blood cultures growing MSSA and patient called to return to the hospital. Patient returned 11/9. She was started on Ancef. No hardware or other joint evolvement.    Review of Systems: A complete ROS was negative except as per HPI  Past Medical History:  Diagnosis Date   Hyperlipidemia    Hypertension    Obesity    Type 2 diabetes mellitus (HCC)     Social History   Tobacco Use   Smoking status: Former    Types: Cigarettes    Quit date: 12/11/2010    Years since quitting: 10.1   Smokeless tobacco: Never  Substance Use Topics   Alcohol use: No   Drug use: No    Family History  Problem Relation Age of Onset   Hypothyroidism Mother    Diabetes Mother    Diabetes Maternal Grandmother    Breast cancer Paternal Grandmother  Allergies  Allergen Reactions   Sulfa Antibiotics Other (See Comments)    Makes sick   Lisinopril Cough    OBJECTIVE: Blood pressure 126/74, pulse (!) 105, temperature 100 F (37.8 C), temperature source Oral, resp. rate 18, height 5\' 7"  (1.702 m), weight 121.2 kg, SpO2 95 %.  General: Obese female in no acute distress HE: Normocephalic, atraumatic , EOMI ENT: No congestion, no rhinorrhea Cardiovascular: Tachycardic, normal rhythm  Pulmonary : Normal work of breathing on room air Abdominal: soft, nontender,  bowel sounds present Musculoskeletal: 2+ pitting edema in foot ankle and trace edema up to mid knee, , no open wounds. Area of bruise or discoloration  as seen in picture on lateral side of left foot  Skin: Left foot is warm and erythema of left foot ankle , extended up to mid shin Psych: Sad affect and mood    Lab Results Lab Results  Component Value Date   WBC 6.7 02/18/2021   HGB 10.3 (L) 02/18/2021   HCT 29.7 (L) 02/18/2021   MCV 81.4 02/18/2021   PLT 158 02/18/2021    Lab Results  Component Value Date   CREATININE 0.43 (L) 02/18/2021   BUN 9 02/18/2021   NA 131 (L) 02/18/2021   K 2.8 (L) 02/18/2021   CL 99 02/18/2021   CO2 21 (L) 02/18/2021    Lab Results  Component Value Date   ALT 27 02/16/2021   AST 27 02/16/2021   ALKPHOS 113 02/16/2021   BILITOT 1.4 (H) 02/16/2021     Microbiology: Recent Results (from the past 240 hour(s))  Resp Panel by RT-PCR (Flu A&B, Covid) Nasopharyngeal Swab     Status: None   Collection Time: 02/16/21  5:37 PM   Specimen: Nasopharyngeal Swab; Nasopharyngeal(NP) swabs in vial transport medium  Result Value Ref Range Status   SARS Coronavirus 2 by RT PCR NEGATIVE NEGATIVE Final    Comment: (NOTE) SARS-CoV-2 target nucleic acids are NOT DETECTED.  The SARS-CoV-2 RNA is generally detectable in upper respiratory specimens during the acute phase of infection. The lowest concentration of SARS-CoV-2 viral copies this assay can detect is 138 copies/mL. A negative result does not preclude SARS-Cov-2 infection and should not be used as the sole basis for treatment or other patient management decisions. A negative result may occur with  improper specimen collection/handling, submission of specimen other than nasopharyngeal swab, presence of viral mutation(s) within the areas targeted by this assay, and inadequate number of viral copies(<138 copies/mL). A negative result must be combined with clinical observations, patient history, and epidemiological information. The expected result is Negative.  Fact Sheet for Patients:  BloggerCourse.com  Fact Sheet for  Healthcare Providers:  SeriousBroker.it  This test is no t yet approved or cleared by the [place] FDA and  has been authorized for detection and/or diagnosis of SARS-CoV-2 by FDA under an Emergency Use Authorization (EUA). This EUA will remain  in effect (meaning this test can be used) for the duration of the COVID-19 declaration under Section 564(b)(1) of the Act, 21 U.S.C.section 360bbb-3(b)(1), unless the authorization is terminated  or revoked sooner.       Influenza A by PCR NEGATIVE NEGATIVE Final   Influenza B by PCR NEGATIVE NEGATIVE Final    Comment: (NOTE) The Xpert Xpress SARS-CoV-2/FLU/RSV plus assay is intended as an aid in the diagnosis of influenza from Nasopharyngeal swab specimens and should not be used as a sole basis for treatment. Nasal washings and aspirates are unacceptable for Xpert Xpress SARS-CoV-2/FLU/RSV  testing.  Fact Sheet for Patients: BloggerCourse.com  Fact Sheet for Healthcare Providers: SeriousBroker.it  This test is not yet approved or cleared by the [place] FDA and has been authorized for detection and/or diagnosis of SARS-CoV-2 by FDA under an Emergency Use Authorization (EUA). This EUA will remain in effect (meaning this test can be used) for the duration of the COVID-19 declaration under Section 564(b)(1) of the Act, 21 U.S.C. section 360bbb-3(b)(1), unless the authorization is terminated or revoked.  Performed at [facility], [address], [place], [place] [address]   Culture, blood (routine x 2)     Status: None (Preliminary result)   Collection Time: 02/16/21  5:45 PM   Specimen: Site Not Specified; Blood  Result Value Ref Range Status   Specimen Description SITE NOT SPECIFIED  Final   Special Requests   Final    BOTTLES DRAWN AEROBIC AND ANAEROBIC Blood Culture adequate volume   Culture  Setup Time   Final    GRAM POSITIVE COCCI IN  CLUSTERS IN BOTH AEROBIC AND ANAEROBIC BOTTLES CRITICAL RESULT CALLED TO, READ BACK BY AND VERIFIED WITH: RN Solmon Ice [facility] [id number] AT 1307 BY CM Performed at [facility], [name], [place], [place] [address]    Culture GRAM POSITIVE COCCI  Final   Report Status PENDING  Incomplete  Blood Culture ID Panel (Reflexed)     Status: Abnormal   Collection Time: 02/16/21  5:45 PM  Result Value Ref Range Status   Enterococcus faecalis NOT DETECTED NOT DETECTED Final   Enterococcus Faecium NOT DETECTED NOT DETECTED Final   Listeria monocytogenes NOT DETECTED NOT DETECTED Final   Staphylococcus species DETECTED (A) NOT DETECTED Final    Comment: CRITICAL RESULT CALLED TO, READ BACK BY AND VERIFIED WITH: RN J BAILEY [facility] [id number] AT 1307 BY CM     Staphylococcus aureus (BCID) DETECTED (A) NOT DETECTED Final    Comment: CRITICAL RESULT CALLED TO, READ BACK BY AND VERIFIED WITH: RN J BAILEY [facility] [id number] AT 1307 BY CM    Staphylococcus epidermidis NOT DETECTED NOT DETECTED Final   Staphylococcus lugdunensis NOT DETECTED NOT DETECTED Final   Streptococcus species NOT DETECTED NOT DETECTED Final   Streptococcus agalactiae NOT DETECTED NOT DETECTED Final   Streptococcus pneumoniae NOT DETECTED NOT DETECTED Final   Streptococcus pyogenes NOT DETECTED NOT DETECTED Final   A.calcoaceticus-baumannii NOT DETECTED NOT DETECTED Final   Bacteroides fragilis NOT DETECTED NOT DETECTED Final   Enterobacterales NOT DETECTED NOT DETECTED Final   Enterobacter cloacae complex NOT DETECTED NOT DETECTED Final   Escherichia coli NOT DETECTED NOT DETECTED Final   Klebsiella aerogenes NOT DETECTED NOT DETECTED Final   Klebsiella oxytoca NOT DETECTED NOT DETECTED Final   Klebsiella pneumoniae NOT DETECTED NOT DETECTED Final   Proteus species NOT DETECTED NOT DETECTED Final   Salmonella species NOT DETECTED NOT DETECTED Final   Serratia marcescens NOT DETECTED NOT DETECTED Final   Haemophilus influenzae NOT  DETECTED NOT DETECTED Final   Neisseria meningitidis NOT DETECTED NOT DETECTED Final   Pseudomonas aeruginosa NOT DETECTED NOT DETECTED Final   Stenotrophomonas maltophilia NOT DETECTED NOT DETECTED Final   Candida albicans NOT DETECTED NOT DETECTED Final   Candida auris NOT DETECTED NOT DETECTED Final   Candida glabrata NOT DETECTED NOT DETECTED Final   Candida krusei NOT DETECTED NOT DETECTED Final   Candida parapsilosis NOT DETECTED NOT DETECTED Final   Candida tropicalis NOT DETECTED NOT DETECTED Final   Cryptococcus neoformans/gattii  NOT DETECTED NOT DETECTED Final   Meth resistant mecA/C and MREJ NOT DETECTED NOT DETECTED Final    Comment: Performed at [facility], [address], [place], [place] [address]  Resp Panel by RT-PCR (Flu A&B, Covid) Nasopharyngeal Swab     Status: None   Collection Time: 02/17/21  8:39 PM   Specimen: Nasopharyngeal Swab; Nasopharyngeal(NP) swabs in vial transport medium  Result Value Ref Range Status   SARS Coronavirus 2 by RT PCR NEGATIVE NEGATIVE Final    Comment: (NOTE) SARS-CoV-2 target nucleic acids are NOT DETECTED.  The SARS-CoV-2 RNA is generally detectable in upper respiratory specimens during the acute phase of infection. The lowest concentration of SARS-CoV-2 viral copies this assay can detect is 138 copies/mL. A negative result does not preclude SARS-Cov-2 infection and should not be used as the sole basis for treatment or other patient management decisions. A negative result may occur with  improper specimen collection/handling, submission of specimen other than nasopharyngeal swab, presence of viral mutation(s) within the areas targeted by this assay, and inadequate number of viral copies(<138 copies/mL). A negative result must be combined with clinical observations, patient history, and epidemiological information. The expected result is Negative.  Fact Sheet for Patients:  BloggerCourse.com  Fact  Sheet for Healthcare Providers:  SeriousBroker.it  This test is no t yet approved or cleared by the [place] FDA and  has been authorized for detection and/or diagnosis of SARS-CoV-2 by FDA under an Emergency Use Authorization (EUA). This EUA will remain  in effect (meaning this test can be used) for the duration of the COVID-19 declaration under Section 564(b)(1) of the Act, 21 U.S.C.section 360bbb-3(b)(1), unless the authorization is terminated  or revoked sooner.       Influenza A by PCR NEGATIVE NEGATIVE Final   Influenza B by PCR NEGATIVE NEGATIVE Final    Comment: (NOTE) The Xpert Xpress SARS-CoV-2/FLU/RSV plus assay is intended as an aid in the diagnosis of influenza from Nasopharyngeal swab specimens and should not be used as a sole basis for treatment. Nasal washings and aspirates are unacceptable for Xpert Xpress SARS-CoV-2/FLU/RSV testing.  Fact Sheet for Patients: BloggerCourse.com  Fact Sheet for Healthcare Providers: SeriousBroker.it  This test is not yet approved or cleared by the [place] FDA and has been authorized for detection and/or diagnosis of SARS-CoV-2 by FDA under an Emergency Use Authorization (EUA). This EUA will remain in effect (meaning this test can be used) for the duration of the COVID-19 declaration under Section 564(b)(1) of the Act, 21 U.S.C. section 360bbb-3(b)(1), unless the authorization is terminated or revoked.  Performed at [facility], [address] [place], [place] [address]     Thurmon Fair, MD PGY3 Internal Medicine [phone]

## 2021-02-18 NOTE — Progress Notes (Signed)
   02/18/21 1811  Assess: MEWS Score  Temp (!) 100.4 F (38 C)  BP 116/68  Pulse Rate (!) 123  SpO2 91 %  O2 Device Room Air  Assess: MEWS Score  MEWS Temp 0  MEWS Systolic 0  MEWS Pulse 2  MEWS RR 0  MEWS LOC 0  MEWS Score 2  MEWS Score Color Yellow  Treat  MEWS Interventions Administered prn meds/treatments  Pain Scale 0-10  Pain Score 4  Pain Type Acute pain  Pain Location Leg  Pain Orientation Left  Patients Stated Pain Goal 2  Pain Intervention(s) Rest  Take Vital Signs  Increase Vital Sign Frequency  Yellow: Q 2hr X 2 then Q 4hr X 2, if remains yellow, continue Q 4hrs

## 2021-02-18 NOTE — Progress Notes (Signed)
  Echocardiogram 2D Echocardiogram has been performed.  Roosvelt Maser F 02/18/2021, 9:34 AM

## 2021-02-18 NOTE — Progress Notes (Signed)
   02/18/21 1629  Assess: MEWS Score  Temp (!) 101.5 F (38.6 C)  BP (!) 143/69  Pulse Rate (!) 117  Resp 20  SpO2 92 %  O2 Device Room Air  Assess: MEWS Score  MEWS Temp 2  MEWS Systolic 0  MEWS Pulse 2  MEWS RR 0  MEWS LOC 0  MEWS Score 4  MEWS Score Color Red  Assess: if the MEWS score is Yellow or Red  Were vital signs taken at a resting state? Yes  Focused Assessment Change from prior assessment (see assessment flowsheet)  Early Detection of Sepsis Score *See Row Information* Medium  MEWS guidelines implemented *See Row Information* Yes  Treat  MEWS Interventions Administered prn meds/treatments  Pain Scale 0-10  Pain Score 5  Pain Type Acute pain  Pain Location Leg  Pain Orientation Left  Patients Stated Pain Goal 2  Pain Intervention(s) Repositioned  Take Vital Signs  Increase Vital Sign Frequency  Red: Q 1hr X 4 then Q 4hr X 4, if remains red, continue Q 4hrs  Notify: Charge Nurse/RN  Name of Charge Nurse/RN Notified Beverline RN  Date Charge Nurse/RN Notified 02/18/21  Notify: Provider  Provider Name/Title Mariel Kansky resident  Date Provider Notified 02/18/21  Time Provider Notified 1640  Notification Type Page  Notification Reason Other (Comment)

## 2021-02-18 NOTE — Progress Notes (Addendum)
Inpatient Diabetes Program Recommendations  AACE/ADA: New Consensus Statement on Inpatient Glycemic Control (2015)  Target Ranges:  Prepandial:   less than 140 mg/dL      Peak postprandial:   less than 180 mg/dL (1-2 hours)      Critically ill patients:  140 - 180 mg/dL   Lab Results  Component Value Date   GLUCAP 165 (H) 02/18/2021    Review of Glycemic Control  Diabetes history: type 2 Outpatient Diabetes medications: Levemir 100 units at HS, Januvia 100 mg at HS, Metformin 500 mg at HS Current orders for Inpatient glycemic control: Levemir 100 units at HS, Novolog RESISTANT correction scale TID & HS scale, Novolog 5 units TID, Tradjenta 5 mg daily  Inpatient Diabetes Program Recommendations:   Received diabetes coordinator consult. Went to see patient; patient was in MRI, so spoke with mother at the bedside. Will follow up with patient tomorrow to get more history on her diabetes. Mother states that patient will be looking for a new PCP after discharge.  Noted that Levemir total of 100 units was given yesterday evening. CBGs much better today: 169 mg/dl (fasting), 017 mg/dl. Agree with current insulin regimen in the hospital.   Smith Mince RN BSN CDE Diabetes Coordinator Pager: [phone]  8am-5pm

## 2021-02-18 NOTE — Progress Notes (Deleted)
   02/18/21 1629 02/18/21 1811  Assess: MEWS Score  Temp (!) 101.5 F (38.6 C) (!) 100.4 F (38 C)  BP (!) 143/69 116/68  Pulse Rate (!) 117 (!) 123  SpO2  --  91 %  O2 Device  --  Room [facility]

## 2021-02-18 NOTE — Progress Notes (Signed)
Subjective:  Lori Schmidt is a 45 y.o. female with a history of DM, HTN, HLD and depression who was admitted for MSSA bacteremia 2/2 [name].   Hospital day: 1  Overnight event: No acute events overnight.   Patient seen at bedside during morning rounds. She reports that her foot is still bothering her and she noticed a black spot that appeared overnight. She describes an occasional shooting 10/10 pain up her foot that is partially relieved by Ibuprofen, but most of the time her foot feels numb. She denies SOB, abdominal pain, nausea and vomiting. She has no other complaints.   Objective:  Vital signs in last 24 hours: Vitals:   02/18/21 0056 02/18/21 0118 02/18/21 0221 02/18/21 0538  BP: 111/64  110/61 (!) 99/58  Pulse: (!) 116  (!) 110 99  Resp: 18  18 17   Temp: 99 F (37.2 C)  98.8 F (37.1 C) 98.1 F (36.7 C)  TempSrc: Oral  Oral Oral  SpO2: 96%  95% 97%  Weight:  121.2 kg    Height:  5\' 7"  (1.702 m)      Filed Weights   02/18/21 0118  Weight: 121.2 kg     Intake/Output Summary (Last 24 hours) at 02/18/2021 0724 Last data filed at 02/18/2021 0616 Gross per 24 hour  Intake 2204.95 ml  Output --  Net 2204.95 ml   Net IO Since Admission: 2,204.95 mL [02/18/21 0724]  Recent Labs    02/18/21 0056 02/18/21 0401 02/18/21 0507  GLUCAP 361* 328* 299*     Pertinent Labs: CBC Latest Ref Rng & Units 02/18/2021 02/17/2021 02/16/2021  WBC 4.0 - 10.5 K/uL 6.7 8.6 7.1  Hemoglobin 12.0 - 15.0 g/dL 10.3(L) 10.8(L) 11.9(L)  Hematocrit 36.0 - 46.0 % 29.7(L) 31.7(L) 34.7(L)  Platelets 150 - 400 K/uL 158 175 174    CMP Latest Ref Rng & Units 02/18/2021 02/17/2021 02/16/2021  Glucose 70 - 99 mg/dL 13/12/2020) 13/11/2020) 973(Z)  BUN 6 - 20 mg/dL 10 12 12   Creatinine 0.44 - 1.00 mg/dL 329(J 242(A  Sodium 135 - 145 mmol/L 128(L) 128(L) 127(L)  Potassium 3.5 - 5.1 mmol/L 2.8(L) 2.7(LL) 3.0(L)  Chloride 98 - 111 mmol/L 95(L) 93(L) 89(L)  CO2 22 - 32 mmol/L 19(L) 17(L)  21(L)  Calcium 8.9 - 10.3 mg/dL 8.1(L) 8.4(L) 8.8(L)  Total Protein 6.5 - 8.1 g/dL - - 7.0  Total Bilirubin 0.3 - 1.2 mg/dL - - 1.4(H)  Alkaline Phos 38 - 126 U/L - - 113  AST 15 - 41 U/L - - 27  ALT 0 - 44 U/L - - 27    Imaging: DG Foot Complete Left  Result Date: 02/17/2021 CLINICAL DATA:  Left foot infection. EXAM: LEFT FOOT - COMPLETE 3+ VIEW COMPARISON:  None. FINDINGS: No acute fracture or dislocation. Irregularity of the dorsal cortex of a tarsal bone, likely medial cuneiform. This appears chronic. An infectious process/osteomyelitis is less likely but not excluded clinical correlation recommended there is diffuse soft tissue swelling and subcutaneous edema of the dorsum of the foot. No radiopaque foreign object or soft tissue gas. IMPRESSION: 1. No acute fracture or dislocation. 2. Diffuse soft tissue swelling and subcutaneous edema of the dorsum of the foot. Findings may represent cellulitis. Clinical correlation is recommended. 3. Irregularity of the dorsal cortex of a tarsal bone, likely medial cuneiform. Electronically Signed   By: 2.22 M.D.   On: 02/17/2021 19:48    Physical Exam  General: 13/12/2020 stated age, laying in bed.  No acute distress. CV: RRR. No m/r/g. No LE edema Pulmonary: Lungs clear anteriorly. Normal effort.  Abdominal: Soft, nontender, nondistended. Normal bowel sounds. Extremities: Left foot significantly swollen with increased warmth compared to right foot. Erythema present to mid-shin. Medial dorsal surface of mid-foot concerning for abscess formation. Bruising present on lateral plantar surface.  Neuro: A&Ox3. No focal deficts Psych: Appropriate mood and affect         Assessment/Plan: Lori Schmidt is a 45 y.o. female with hx of diabetes, HTN, HLD, and depression presenting with LLE edema.   Active Problems:   Staphylococcus aureus bacteremia  #Cellulitis of LLE  #MSSA bacteremia  Left foot appears more swollen than yesterday  despite antibiotics. Exam is concerning for deeper infection and osteomyelitis given patient's poorly controlled diabetes and blood cultures positive for MSSA. Will obtain STAT MRI of left foot and tibia/fibula. Will also add oxycodone 5mg  po PRN pain. Patient underwent TTE earlier this morning, results pending. -f/u MRI results  -Continue Ancef q8h -Daily blood cultures  -f/u Echo results -Ibuprofen and Oxycodone PRN pain    #DM #Concern for mild DKA  #Hypokalemia Patient continues to deny symptoms of DKA, including nausea, vomiting, abdominal pain, polyuria, and polydipsia. Beta-hydroxybutyric acid down-trending, most recent 0.51 from 2.68. CBGs today range from 169-361. K+ remains low at 2.8, bicarb 21 from 19, anion gap of 11. Normal mag of 2.0. Will continue to check beta-hydroxybutyric levels q6h until anion gap has normalized (between 8-12) x2. Will also split Levemir 100U qhs to 50U BID to reduce the volume of insulin administered.  -Novolog 5U TID with meals  -Levemir 50U BID -SSI with CBG monitoring  -Linagliptin 5mg  po daily  -LR with Kcl 66mEq -Oral K+ 80mEq po BID  -Daily BMP   #Mildly elevated beta hCG  Patient receives Depo-Provera injection for contraception, most recently on 9/27. No prior beta-hCG levels to compare to so unclear if it is typically slightly elevated for her. Today's discussion was focused on the cellulitis and potential surgical intervention, but will obtain thorough sexual history and recheck b-hCG tomorrow. -f/u beta hCG  -sexual hx tomorrow   #HTN  #HLD  BP has remained appropriate, most recently 126/74. Continue to hold teratogenic meds until we can confirm patient is not pregnant.  -Hold pravastatin 20mg  daily  -Hold Losartan 25mg  daily   #Normocytic anemia  Folate level wnl at 6.9. Hgb 10.3, iron of 14, TIBC 207, 8% saturation. Ferritin elevated at 1607, but this likely reflects its role as an acute phase reactant in the context of patient having  MSSA bacteriemia. B12 elevated to 4289. Low normal B12 of 318 in 11/2019. Will hold off on IV iron infusion due to active infection. -No iron infusion at this time  -B12 supplement to every other day   #Depression  -Continue Celexa 40mg  daily   Diet: Carb modified  IVF: Maintenance LR with KCl 4mEq  VTE: Lovenox CODE: FULL  Prior to Admission Living Arrangement: Home with 68 y.o. daughter Anticipated Discharge Location: Home Barriers to Discharge: Medical stability  Dispo: Admit to inpatient with expected length of stay >2 midnights   Signed: Stefani Dama, Medical Student 02/18/2021, 7:24 AM  Pager: [phone] Internal Medicine Teaching Service After 5pm on weekdays and 1pm on weekends: On Call pager: [phone]

## 2021-02-18 NOTE — Consult Note (Signed)
Reason for Consult:Left foot osteo Referring Physician: Reymundo Poll Time called: 1416 Time at bedside: 1452   Lori Schmidt is an 45 y.o. female.  HPI: Lori Schmidt was admitted yesterday with cellulitis of the left foot. She's been having pain without ulceration for about a month. About a week ago it became bad enough that she could not bear weight. She was seen 11/8 and given ceftriaxone then directed to come to ED yesterday when blood cultures were positive. MRI showed extensive osteo in mid and hindfoot and orthopedic surgery was consulted. She admits to fevers and nausea.   Past Medical History:  Diagnosis Date   Hyperlipidemia    Hypertension    Obesity    Type 2 diabetes mellitus (HCC)     Past Surgical History:  Procedure Laterality Date   BREAST CYST EXCISION Right 05/2018   four cysts removed on right breast/axilla area    Family History  Problem Relation Age of Onset   Hypothyroidism Mother    Diabetes Mother    Diabetes Maternal Grandmother    Breast cancer Paternal Grandmother     Social History:  reports that she quit smoking about 10 years ago. Her smoking use included cigarettes. She has never used smokeless tobacco. She reports that she does not drink alcohol and does not use drugs.  Allergies:  Allergies  Allergen Reactions   Sulfa Antibiotics Other (See Comments)    Makes sick   Lisinopril Cough    Medications: I have reviewed the patient's current medications.  Results for orders placed or performed during the hospital encounter of 02/17/21 (from the past 48 hour(s))  Basic metabolic panel     Status: Abnormal   Collection Time: 02/17/21  7:11 PM  Result Value Ref Range   Sodium 128 (L) 135 - 145 mmol/L   Potassium 2.7 (LL) 3.5 - 5.1 mmol/L    Comment: CRITICAL RESULT CALLED TO, READ BACK BY AND VERIFIED WITH: Josefa Half RN 02/17/21 2045 M KOROLESKI    Chloride 93 (L) 98 - 111 mmol/L   CO2 17 (L) 22 - 32 mmol/L   Glucose, Bld 335 (H)  70 - 99 mg/dL    Comment: Glucose reference range applies only to samples taken after fasting for at least 8 hours.   BUN 12 6 - 20 mg/dL   Creatinine, Ser 8.03 0.44 - 1.00 mg/dL   Calcium 8.4 (L) 8.9 - 10.3 mg/dL   GFR, Estimated >21 >22 mL/min    Comment: (NOTE) Calculated using the CKD-EPI Creatinine Equation (2021)    Anion gap 18 (H) 5 - 15    Comment: Performed at [facility], [address] [place], [place] [address]  CBC with Differential/Platelet     Status: Abnormal   Collection Time: 02/17/21  7:11 PM  Result Value Ref Range   WBC 8.6 4.0 - 10.5 K/uL   RBC 3.79 (L) 3.87 - 5.11 MIL/uL   Hemoglobin 10.8 (L) 12.0 - 15.0 g/dL   HCT 03.7 (L) 04.8 - 88.9 %   MCV 83.6 80.0 - 100.0 fL   MCH 28.5 26.0 - 34.0 pg   MCHC 34.1 30.0 - 36.0 g/dL   RDW 16.9 45.0 - 38.8 %   Platelets 175 150 - 400 K/uL   nRBC 0.0 0.0 - 0.2 %   Neutrophils Relative % 94 %   Neutro Abs 8.1 (H) 1.7 - 7.7 K/uL   Lymphocytes Relative 4 %   Lymphs Abs 0.3 (L) 0.7 - 4.0  K/uL   Monocytes Relative 1 %   Monocytes Absolute 0.1 0.1 - 1.0 K/uL   Eosinophils Relative 0 %   Eosinophils Absolute 0.0 0.0 - 0.5 K/uL   Basophils Relative 0 %   Basophils Absolute 0.0 0.0 - 0.1 K/uL   nRBC 0 0 /100 WBC   Promyelocytes Relative 1 %   Abs Immature Granulocytes 0.10 (H) 0.00 - 0.07 K/uL    Comment: Performed at [facility], [address] [place], [place] [address]  Lactic acid, plasma     Status: None   Collection Time: 02/17/21  7:11 PM  Result Value Ref Range   Lactic Acid, Venous 1.2 0.5 - 1.9 mmol/L    Comment: Performed at [facility], [address], [place], [place] [address]  I-Stat beta hCG blood, ED     Status: Abnormal   Collection Time: 02/17/21  7:40 PM  Result Value Ref Range   I-stat hCG, quantitative 11.3 (H) <5 mIU/mL   Comment 3            Comment:   GEST. AGE      CONC.  (mIU/mL)   <=1 WEEK        5 - 50     2 WEEKS       50 - 500     3 WEEKS       100 - 10,000     4  WEEKS     1,000 - 30,000        FEMALE AND NON-PREGNANT FEMALE:     LESS THAN 5 mIU/mL   Resp Panel by RT-PCR (Flu A&B, Covid) Nasopharyngeal Swab     Status: None   Collection Time: 02/17/21  8:39 PM   Specimen: Nasopharyngeal Swab; Nasopharyngeal(NP) swabs in vial transport medium  Result Value Ref Range   SARS Coronavirus 2 by RT PCR NEGATIVE NEGATIVE    Comment: (NOTE) SARS-CoV-2 target nucleic acids are NOT DETECTED.  The SARS-CoV-2 RNA is generally detectable in upper respiratory specimens during the acute phase of infection. The lowest concentration of SARS-CoV-2 viral copies this assay can detect is 138 copies/mL. A negative result does not preclude SARS-Cov-2 infection and should not be used as the sole basis for treatment or other patient management decisions. A negative result may occur with  improper specimen collection/handling, submission of specimen other than nasopharyngeal swab, presence of viral mutation(s) within the areas targeted by this assay, and inadequate number of viral copies(<138 copies/mL). A negative result must be combined with clinical observations, patient history, and epidemiological information. The expected result is Negative.  Fact Sheet for Patients:  BloggerCourse.com  Fact Sheet for Healthcare Providers:  SeriousBroker.it  This test is no t yet approved or cleared by the [place] FDA and  has been authorized for detection and/or diagnosis of SARS-CoV-2 by FDA under an Emergency Use Authorization (EUA). This EUA will remain  in effect (meaning this test can be used) for the duration of the COVID-19 declaration under Section 564(b)(1) of the Act, 21 U.S.C.section 360bbb-3(b)(1), unless the authorization is terminated  or revoked sooner.       Influenza A by PCR NEGATIVE NEGATIVE   Influenza B by PCR NEGATIVE NEGATIVE    Comment: (NOTE) The Xpert Xpress SARS-CoV-2/FLU/RSV plus assay  is intended as an aid in the diagnosis of influenza from Nasopharyngeal swab specimens and should not be used as a sole basis for treatment. Nasal washings and aspirates are unacceptable for Xpert  Xpress SARS-CoV-2/FLU/RSV testing.  Fact Sheet for Patients: BloggerCourse.com  Fact Sheet for Healthcare Providers: SeriousBroker.it  This test is not yet approved or cleared by the [place] FDA and has been authorized for detection and/or diagnosis of SARS-CoV-2 by FDA under an Emergency Use Authorization (EUA). This EUA will remain in effect (meaning this test can be used) for the duration of the COVID-19 declaration under Section 564(b)(1) of the Act, 21 U.S.C. section 360bbb-3(b)(1), unless the authorization is terminated or revoked.  Performed at [facility], [address], [place], [place] [address]   Urine rapid drug screen (hosp performed)     Status: None   Collection Time: 02/18/21 12:15 AM  Result Value Ref Range   Opiates NONE DETECTED NONE DETECTED   Cocaine NONE DETECTED NONE DETECTED   Benzodiazepines NONE DETECTED NONE DETECTED   Amphetamines NONE DETECTED NONE DETECTED   Tetrahydrocannabinol NONE DETECTED NONE DETECTED   Barbiturates NONE DETECTED NONE DETECTED    Comment: (NOTE) DRUG SCREEN FOR MEDICAL PURPOSES ONLY.  IF CONFIRMATION IS NEEDED FOR ANY PURPOSE, NOTIFY LAB WITHIN 5 DAYS.  LOWEST DETECTABLE LIMITS FOR URINE DRUG SCREEN Drug Class                     Cutoff (ng/mL) Amphetamine and metabolites    1000 Barbiturate and metabolites    200 Benzodiazepine                 200 Tricyclics and metabolites     300 Opiates and metabolites        300 Cocaine and metabolites        300 THC                            50 Performed at [facility], [address] [place], [place] [address]   Urinalysis, Routine w reflex microscopic Urine, Clean Catch     Status: Abnormal   Collection Time:  02/18/21 12:15 AM  Result Value Ref Range   Color, Urine YELLOW YELLOW   APPearance HAZY (A) CLEAR   Specific Gravity, Urine 1.024 1.005 - 1.030   pH 6.0 5.0 - 8.0   Glucose, UA >=500 (A) NEGATIVE mg/dL   Hgb urine dipstick SMALL (A) NEGATIVE   Bilirubin Urine NEGATIVE NEGATIVE   Ketones, ur 80 (A) NEGATIVE mg/dL   Protein, ur 30 (A) NEGATIVE mg/dL   Nitrite NEGATIVE NEGATIVE   Leukocytes,Ua NEGATIVE NEGATIVE   RBC / HPF 0-5 0 - 5 RBC/hpf   WBC, UA 6-10 0 - 5 WBC/hpf   Bacteria, UA RARE (A) NONE SEEN   Squamous Epithelial / LPF 0-5 0 - 5   Mucus PRESENT    Non Squamous Epithelial 0-5 (A) NONE SEEN    Comment: Performed at [facility], [address], [place], [place] [address]  Glucose, capillary     Status: Abnormal   Collection Time: 02/18/21 12:56 AM  Result Value Ref Range   Glucose-Capillary 361 (H) 70 - 99 mg/dL    Comment: Glucose reference range applies only to samples taken after fasting for at least 8 hours.  HIV Antibody (routine testing w rflx)     Status: None   Collection Time: 02/18/21  1:31 AM  Result Value Ref Range   HIV Screen 4th Generation wRfx Non Reactive Non Reactive    Comment: Performed at [facility], [address] [place],  [place] [address]  CBC     Status: Abnormal   Collection Time: 02/18/21  1:31 AM  Result Value Ref Range   WBC 6.7 4.0 - 10.5 K/uL   RBC 3.65 (L) 3.87 - 5.11 MIL/uL   Hemoglobin 10.3 (L) 12.0 - 15.0 g/dL   HCT 60.4 (L) 54.0 - 98.1 %   MCV 81.4 80.0 - 100.0 fL   MCH 28.2 26.0 - 34.0 pg   MCHC 34.7 30.0 - 36.0 g/dL   RDW 19.1 47.8 - 29.5 %   Platelets 158 150 - 400 K/uL   nRBC 0.0 0.0 - 0.2 %    Comment: Performed at [facility], [address] [place], [place] [address]  Beta-hydroxybutyric acid     Status: Abnormal   Collection Time: 02/18/21  1:31 AM  Result Value Ref Range   Beta-Hydroxybutyric Acid 2.68 (H) 0.05 - 0.27 mmol/L    Comment: Performed at [facility], [address],  [place], [place] [address]  Basic metabolic panel     Status: Abnormal   Collection Time: 02/18/21  1:31 AM  Result Value Ref Range   Sodium 128 (L) 135 - 145 mmol/L   Potassium 2.8 (L) 3.5 - 5.1 mmol/L   Chloride 95 (L) 98 - 111 mmol/L   CO2 19 (L) 22 - 32 mmol/L   Glucose, Bld 382 (H) 70 - 99 mg/dL    Comment: Glucose reference range applies only to samples taken after fasting for at least 8 hours.   BUN 10 6 - 20 mg/dL   Creatinine, Ser 4.69 0.44 - 1.00 mg/dL   Calcium 8.1 (L) 8.9 - 10.3 mg/dL   GFR, Estimated >62 >95 mL/min    Comment: (NOTE) Calculated using the CKD-EPI Creatinine Equation (2021)    Anion gap 14 5 - 15    Comment: Performed at [facility], [address] [place], [place] [address]  Glucose, capillary     Status: Abnormal   Collection Time: 02/18/21  4:01 AM  Result Value Ref Range   Glucose-Capillary 328 (H) 70 - 99 mg/dL    Comment: Glucose reference range applies only to samples taken after fasting for at least 8 hours.  Glucose, capillary     Status: Abnormal   Collection Time: 02/18/21  5:07 AM  Result Value Ref Range   Glucose-Capillary 299 (H) 70 - 99 mg/dL    Comment: Glucose reference range applies only to samples taken after fasting for at least 8 hours.  Iron and TIBC     Status: Abnormal   Collection Time: 02/18/21  7:13 AM  Result Value Ref Range   Iron 16 (L) 28 - 170 ug/dL   TIBC 244 (L) 010 - 272 ug/dL   Saturation Ratios 8 (L) 10.4 - 31.8 %   UIBC 191 ug/dL    Comment: Performed at [facility], [address] [place], [place] [address]  Ferritin     Status: Abnormal   Collection Time: 02/18/21  7:13 AM  Result Value Ref Range   Ferritin 1,607 (H) 11 - 307 ng/mL    Comment: Performed at [facility], [address], [place], [place] [address]  Vitamin B12     Status: Abnormal   Collection Time: 02/18/21  7:13 AM  Result Value Ref Range   Vitamin B-12 4,289 (H) 180 - 914 pg/mL    Comment: (NOTE) This assay is not  validated for testing neonatal or myeloproliferative  syndrome specimens for Vitamin B12 levels. Performed at [facility], [address], [place], [place] [address]   Folate, serum, performed at [facility] lab     Status: None   Collection Time: 02/18/21  7:13 AM  Result Value Ref Range   Folate 6.9 >5.9 ng/mL    Comment: Performed at [facility], [address], [place], [place] [address]  Magnesium     Status: None   Collection Time: 02/18/21  7:28 AM  Result Value Ref Range   Magnesium 2.0 1.7 - 2.4 mg/dL    Comment: Performed at [facility], [address] [place], [place] [address]  Basic metabolic panel     Status: Abnormal   Collection Time: 02/18/21  7:28 AM  Result Value Ref Range   Sodium 131 (L) 135 - 145 mmol/L   Potassium 2.8 (L) 3.5 - 5.1 mmol/L   Chloride 99 98 - 111 mmol/L   CO2 21 (L) 22 - 32 mmol/L   Glucose, Bld 182 (H) 70 - 99 mg/dL    Comment: Glucose reference range applies only to samples taken after fasting for at least 8 hours.   BUN 9 6 - 20 mg/dL   Creatinine, Ser 5.42 (L) 0.44 - 1.00 mg/dL   Calcium 8.0 (L) 8.9 - 10.3 mg/dL   GFR, Estimated >70 >62 mL/min    Comment: (NOTE) Calculated using the CKD-EPI Creatinine Equation (2021)    Anion gap 11 5 - 15    Comment: Performed at [facility], [address], [place], [place] [address]  Beta-hydroxybutyric acid     Status: Abnormal   Collection Time: 02/18/21  7:28 AM  Result Value Ref Range   Beta-Hydroxybutyric Acid 0.51 (H) 0.05 - 0.27 mmol/L    Comment: Performed at [facility], [address] [place], [place] [address]  Glucose, capillary     Status: Abnormal   Collection Time: 02/18/21  9:01 AM  Result Value Ref Range   Glucose-Capillary 169 (H) 70 - 99 mg/dL    Comment: Glucose reference range applies only to samples taken after fasting for at least 8 hours.  Glucose, capillary     Status: Abnormal   Collection Time: 02/18/21 11:49 AM  Result Value Ref  Range   Glucose-Capillary 165 (H) 70 - 99 mg/dL    Comment: Glucose reference range applies only to samples taken after fasting for at least 8 hours.    MR FOOT LEFT W WO CONTRAST  Result Date: 02/18/2021 CLINICAL DATA:  Diabetic foot swelling. EXAM: MRI OF THE LEFT FOOT WITHOUT AND WITH CONTRAST TECHNIQUE: Multiplanar, multisequence MR imaging of the left foot was performed both before and after administration of intravenous contrast. CONTRAST:  40mL GADAVIST GADOBUTROL 1 MMOL/ML IV SOLN COMPARISON:  X-ray 02/17/2021 FINDINGS: Bones/Joint/Cartilage Markedly abnormal appearance of the osseous structures of the left foot with extensive bone marrow edema and enhancement. Extensive confluent low T1 marrow signal changes involving multiple bones, most notably the second, third, and fourth metatarsals as well as the medial, intermediate, and lateral cuneiform bones and the calcaneus and navicular bones. Less prominent signal changes are also present in the cuboid and fifth metatarsal base as well as the talar neck. Bony erosive changes, predominantly along the dorsal aspect of the midfoot. There are extensive joint effusions throughout the hindfoot and midfoot with peripheral rim enhancement, most compatible with septic arthritis. Trace tibiotalar and subtalar joint effusions are nonspecific although septic arthritis  is suspected. Ligaments No definite acute ligamentous abnormality. Muscles and Tendons Findings of diffuse myofasciitis and probable myonecrosis throughout the intrinsic foot musculature. Extensive tenosynovial fluid collections involving the flexor and extensor tendons of the foot. Soft tissues Marked circumferential soft tissue swelling. Lobulated fluid collections throughout the left foot, most of which are contiguous with adjacent joint spaces suggesting septic arthritis and associated soft tissue abscesses. IMPRESSION: 1. 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 2. Numerous peripherally enhancing joint effusions throughout the left midfoot, many of which are contiguous with rim enhancing fluid collections in the adjacent soft tissues. Findings are compatible with multifocal septic arthritis and adjacent abscesses. 3. Extensive tenosynovial fluid collections throughout the flexor and extensor tendons of the left foot, compatible with infectious tenosynovitis. 4. Small tibiotalar and subtalar joint effusions, may be reactive or reflect septic arthritis. 5. Marked diffuse cellulitis of the left foot. These results will be called to the ordering clinician or representative by the Radiologist Assistant, and communication documented in the PACS or [employer]. Electronically Signed   By: Duanne Guess D.O.   On: 02/18/2021 14:06   MR TIBIA FIBULA LEFT W WO CONTRAST  Result Date: 02/18/2021 CLINICAL DATA:  Diabetic foot swelling.  Lower extremity swelling EXAM: MRI OF LOWER LEFT EXTREMITY WITHOUT AND WITH CONTRAST TECHNIQUE: Multiplanar, multisequence MR imaging of the left tibia and fibula was performed both before and after administration of intravenous contrast. CONTRAST:  10mL GADAVIST GADOBUTROL 1 MMOL/ML IV SOLN COMPARISON:  None. FINDINGS: Bones/Joint/Cartilage No acute fracture or dislocation of the left tibia or fibula. No erosion or site of bone destruction. No bone marrow edema. No periosteal elevation. Ligaments Grossly intact. Muscles and Tendons Small volume tenosynovial fluid collections associated with the peroneal tendons, tibialis posterior tendon, as well as the flexor hallucis longus and flexor digitorum longus tendons at the posterior aspect of the ankle. Mild edema-like signal within the anterior and posterior compartment musculature of the lower leg. No intramuscular fluid collection. Small amount of deep fascial fluid and edema with enhancement throughout the anterior and posterior  compartments of the lower leg. No rim enhancing deep fascial fluid collection. Soft tissues Diffuse circumferential subcutaneous edema and soft tissue enhancement. No organized or drainable fluid collection within the lower leg. IMPRESSION: 1. Findings of diffuse cellulitis and myofasciitis throughout the left lower leg. No organized or rim-enhancing fluid collections. 2. Small volume tenosynovial fluid collections associated with the peroneal tendons, tibialis posterior tendon, as well as the flexor hallucis longus and flexor digitorum longus tendons at the posterior aspect of the ankle suggesting mild tenosynovitis, which could be reactive or infectious. 3. No evidence of osteomyelitis of the left tibia or fibula. Electronically Signed   By: Duanne Guess D.O.   On: 02/18/2021 13:55   DG Foot Complete Left  Result Date: 02/17/2021 CLINICAL DATA:  Left foot infection. EXAM: LEFT FOOT - COMPLETE 3+ VIEW COMPARISON:  None. FINDINGS: No acute fracture or dislocation. Irregularity of the dorsal cortex of a tarsal bone, likely medial cuneiform. This appears chronic. An infectious process/osteomyelitis is less likely but not excluded clinical correlation recommended there is diffuse soft tissue swelling and subcutaneous edema of the dorsum of the foot. No radiopaque foreign object or soft tissue gas. IMPRESSION: 1. No acute fracture or dislocation. 2. Diffuse soft tissue swelling and subcutaneous edema of the dorsum of the foot. Findings may represent cellulitis. Clinical correlation is recommended. 3. Irregularity of the dorsal cortex  of a tarsal bone, likely medial cuneiform. Electronically Signed   By: Elgie Collard M.D.   On: 02/17/2021 19:48   VAS [place] LOWER EXTREMITY VENOUS (DVT) (7a-7p)  Result Date: 02/16/2021  Lower Venous DVT Study Patient Name:  LUCY BOARDMAN [facility]  Date of Exam:   02/16/2021 Medical Rec #: [id number]              Accession #:    [id number] Date of Birth: Aug 23, 1975               Patient Gender: F Patient Age:   5 years Exam Location:  [facility] Procedure:      VAS [place] LOWER EXTREMITY VENOUS (DVT) Referring Phys: Mertha Baars --------------------------------------------------------------------------------  Indications: Swelling.  Risk Factors: None identified. Limitations: Body habitus and poor ultrasound/tissue interface. Comparison Study: No prior studies. Performing Technologist: Chanda Busing RVT  Examination Guidelines: A complete evaluation includes B-mode imaging, spectral Doppler, color Doppler, and power Doppler as needed of all accessible portions of each vessel. Bilateral testing is considered an integral part of a complete examination. Limited examinations for reoccurring indications may be performed as noted. The reflux portion of the exam is performed with the patient in reverse Trendelenburg.  +-----+---------------+---------+-----------+----------+--------------+ RIGHTCompressibilityPhasicitySpontaneityPropertiesThrombus Aging +-----+---------------+---------+-----------+----------+--------------+ CFV  Full           Yes      Yes                                 +-----+---------------+---------+-----------+----------+--------------+   +---------+---------------+---------+-----------+----------+--------------+ LEFT     CompressibilityPhasicitySpontaneityPropertiesThrombus Aging +---------+---------------+---------+-----------+----------+--------------+ CFV      Full           Yes      Yes                                 +---------+---------------+---------+-----------+----------+--------------+ SFJ      Full                                                        +---------+---------------+---------+-----------+----------+--------------+ FV Prox  Full                                                        +---------+---------------+---------+-----------+----------+--------------+ FV Mid   Full                                                         +---------+---------------+---------+-----------+----------+--------------+ FV Distal               Yes      Yes                                 +---------+---------------+---------+-----------+----------+--------------+ PFV      Full                                                        +---------+---------------+---------+-----------+----------+--------------+  POP      Full           Yes      Yes                                 +---------+---------------+---------+-----------+----------+--------------+ PTV      Full                                                        +---------+---------------+---------+-----------+----------+--------------+ PERO     Full                                                        +---------+---------------+---------+-----------+----------+--------------+     Summary: RIGHT: - No evidence of common femoral vein obstruction.  LEFT: - There is no evidence of deep vein thrombosis in the lower extremity. However, portions of this examination were limited- see technologist comments above.  - No cystic structure found in the popliteal fossa.  *See table(s) above for measurements and observations. Electronically signed by Waverly Ferrari MD on 02/16/2021 at 9:25:44 PM.    Final     Review of Systems  Constitutional:  Positive for fatigue and fever. Negative for chills and diaphoresis.  HENT:  Negative for ear discharge, ear pain, hearing loss and tinnitus.   Eyes:  Negative for photophobia and pain.  Respiratory:  Negative for cough and shortness of breath.   Cardiovascular:  Negative for chest pain.  Gastrointestinal:  Negative for abdominal pain, nausea and vomiting.  Genitourinary:  Negative for dysuria, flank pain, frequency and urgency.  Musculoskeletal:  Positive for arthralgias (Left foot). Negative for back pain, myalgias and neck pain.  Neurological:  Negative for dizziness and headaches.   Hematological:  Does not bruise/bleed easily.  Psychiatric/Behavioral:  The patient is not nervous/anxious.   Blood pressure 126/74, pulse (!) 105, temperature 100 F (37.8 C), temperature source Oral, resp. rate 18, height 5\' 7"  (1.702 m), weight 121.2 kg, SpO2 95 %. Physical Exam Constitutional:      General: She is not in acute distress.    Appearance: She is well-developed. She is not diaphoretic.  HENT:     Head: Normocephalic and atraumatic.  Eyes:     General: No scleral icterus.       Right eye: No discharge.        Left eye: No discharge.     Conjunctiva/sclera: Conjunctivae normal.  Cardiovascular:     Rate and Rhythm: Normal rate and regular rhythm.  Pulmonary:     Effort: Pulmonary effort is normal. No respiratory distress.  Musculoskeletal:     Cervical back: Normal range of motion.  Feet:     Comments: Left foot: No traumatic wounds or rash, ecchymotic lateral aspect  Mod TTP  Sens DPN, SPN, TN intact  Motor EHL, ext, flex, evers 5/5  No palpable pulses but may be 2/2 edema, 4+ NP edema  Skin:    General: Skin is warm and dry.  Neurological:     Mental Status: She is alert.  Psychiatric:        Mood and Affect: Mood  normal.        Behavior: Behavior normal.    Assessment/Plan: Left foot osteo -- Will likely need BKA. Dr. Lajoyce Corners to evaluate later today or in AM. Will check ABI's.    Freeman Caldron, PA-C Orthopedic Surgery [phone] 02/18/2021, 3:01 PM

## 2021-02-18 NOTE — Hospital Course (Addendum)
Patient Summary  Lori Schmidt is a 45 y.o. female with a history of uncontrolled DM, HTN, HLD, and depression who presented to the ED with [name] complicated by osteomyelitis and MSSA bacteremia. Hospital course is outlined below.   ED Course  Patient first presented to the ED on 11/8 with 1 month of worsening [name]. LE doppler negative for DVT, blood cultures grew MSSA after 1 day, patient subsequently instructed to return to the ED. In the ED, patient was afebrile, tachycardic to 118, normotensive, normal O2 saturation. Labs notable for low potassium 2.7, glucose 335 with anion gap of 8 and bicarb 17, sodium low at 128. CBC without leukocytosis with normocytic anemia with hemoglobin 10.8.  Lactic acid normal at 1.2. X-ray of left foot without acute fracture dislocation, with diffuse soft tissue swelling and subcutaneous edema of the dorsum of the foot. Patient received dose of Ancef and 1 L fluid bolus in the ED.  #MSSA Bacteremia  #[name]  #Osteomyelitis #S/p L transtibial amputation #Discitis  Patient was admitted on 11/9 and ID was consulted due to MSSA bacteremia and patient was started on IV Ancef. TTE was negative for endocarditis. On 11/10 patient appeared to have worsening cellulitis prompting STAT MRI. MRI of left foot notable for marked diffuse cellulitis, extensive acute osteomyelitis throughout the left hindfoot and forefoot with multifocal bony involvement, midfoot findings compatible with multifocal septic arthritis and adjacent abscesses, and extensive tenosynovial fluid collections throughout the flexor and extensor tendons of the left foot, compatible with infectious tenosynovitis. MRI of left tibia and fibula with findings of diffuse cellulitis and myofasciitis, mild tenosynovitis, and no evidence of osteomyelitis. Dr. Sharol Given from orthopedic surgery was consulted. Patient underwent L transtibial amputation on 40/81 with no complications. Patient began  complaining of shoulder pain and back pain, with back MRI on 11/12 notable for findings concerning for discitis/osteomyelitis. Patient was started on an 8 week course of IV Ancef with end of treatment on 04/14/21. Shoulder pain reportedly chronic and shoulder CXR negative. Orthopedic surgery recommended neurosurgery consult. Neurosurgery recommended no surgical intervention and to continue antimicrobial therapy and check CRP and ESR. TEE on 11/15 had no evidence of endocarditis and showed normal LVEF of 60-65% with no wall motion abnormalities. PICC line was placed on 11/17. IV pain meds were stopped on 11/18. At time of discharge, patient was medically stable, and sent with short course of oxycontin for pain management.   #DM  A1c 13. Patient's nightly Levemir 100U was divided into Levemir 50U BID to decrease volume of administration and improve absorption. She was initially placed on Novolog 6U TID with meals with SSI and CBG monitoring. After surgery, patient's insulin regimen continued to be optimized to Levemir 55U qhs and Novolog 8U TID. She was restarted on home Tradjenta 5 mg and metformin 500 mg daily on 11/18. Metformin was increased to 1000 mg bid on discharge. Will be discharged with this regimen and recommend close follow up with PCP.  #Hypokalemia Patient's initial K+ of 2.7 was repleted with oral and IV K+ and normalized on 11/11.   #HTN  #HLD  #Elevated b-hCG  Initially patient's beta-hCG was mildly elevated to 11.3 so losartan and pravastatin were held due to concern for teratogenicity. Repeat beta-hcg was <1. Patient was restarted on pravastatin with increased dose of 67m for primary ASCVD prevention. Losartan was held due to normotensive BP.   #Depression  #Insomnia Patient was continued on Celexa 475mpo daily. She was also started on ambien 5  mg on 11/23 to help the patient sleep.  Follow-Up  PT and OT recommended discharge to CIR. Patient desires new PCP, will follow-up with  the [facility]. ID follow-up scheduled on 12/21 at 10:45 AM.

## 2021-02-19 ENCOUNTER — Inpatient Hospital Stay (HOSPITAL_COMMUNITY): Payer: Managed Care, Other (non HMO) | Admitting: Certified Registered"

## 2021-02-19 ENCOUNTER — Encounter (HOSPITAL_COMMUNITY)
Admission: EM | Disposition: A | Discharge status: Rehabilitation Facility | Payer: Self-pay | Source: Home / Self Care | Attending: Internal Medicine

## 2021-02-19 ENCOUNTER — Encounter (HOSPITAL_COMMUNITY): Payer: Managed Care, Other (non HMO)

## 2021-02-19 DIAGNOSIS — M869 Osteomyelitis, unspecified: Secondary | ICD-10-CM | POA: Diagnosis not present

## 2021-02-19 DIAGNOSIS — B9561 Methicillin susceptible Staphylococcus aureus infection as the cause of diseases classified elsewhere: Secondary | ICD-10-CM | POA: Diagnosis not present

## 2021-02-19 DIAGNOSIS — M86272 Subacute osteomyelitis, left ankle and foot: Secondary | ICD-10-CM | POA: Diagnosis not present

## 2021-02-19 DIAGNOSIS — E1169 Type 2 diabetes mellitus with other specified complication: Secondary | ICD-10-CM | POA: Diagnosis not present

## 2021-02-19 DIAGNOSIS — R7881 Bacteremia: Secondary | ICD-10-CM | POA: Diagnosis not present

## 2021-02-19 DIAGNOSIS — L03116 Cellulitis of left lower limb: Secondary | ICD-10-CM | POA: Diagnosis not present

## 2021-02-19 HISTORY — PX: AMPUTATION: SHX166

## 2021-02-19 LAB — BASIC METABOLIC PANEL
Anion gap: 12 (ref 5–15)
Anion gap: 9 (ref 5–15)
BUN: 10 mg/dL (ref 6–20)
BUN: 6 mg/dL (ref 6–20)
CO2: 22 mmol/L (ref 22–32)
CO2: 22 mmol/L (ref 22–32)
Calcium: 8.2 mg/dL — ABNORMAL LOW (ref 8.9–10.3)
Calcium: 8.2 mg/dL — ABNORMAL LOW (ref 8.9–10.3)
Chloride: 98 mmol/L (ref 98–111)
Chloride: 98 mmol/L (ref 98–111)
Creatinine, Ser: 0.43 mg/dL — ABNORMAL LOW (ref 0.44–1.00)
Creatinine, Ser: 0.53 mg/dL (ref 0.44–1.00)
GFR, Estimated: >60 mL/min (ref >60)
GFR, Estimated: >60 mL/min (ref >60)
Glucose, Bld: 86 mg/dL (ref 70–99)
Glucose, Bld: 87 mg/dL (ref 70–99)
Potassium: 3.4 mmol/L — ABNORMAL LOW (ref 3.5–5.1)
Potassium: 3.5 mmol/L (ref 3.5–5.1)
Sodium: 129 mmol/L — ABNORMAL LOW (ref 135–145)
Sodium: 132 mmol/L — ABNORMAL LOW (ref 135–145)

## 2021-02-19 LAB — CULTURE, BLOOD (ROUTINE X 2): Special Requests: ADEQUATE

## 2021-02-19 LAB — CBC
HCT: 28.5 % — ABNORMAL LOW (ref 36.0–46.0)
Hemoglobin: 9.8 g/dL — ABNORMAL LOW (ref 12.0–15.0)
MCH: 28.2 pg (ref 26.0–34.0)
MCHC: 34.4 g/dL (ref 30.0–36.0)
MCV: 82.1 fL (ref 80.0–100.0)
Platelets: 191 10*3/uL (ref 150–400)
RBC: 3.47 MIL/uL — ABNORMAL LOW (ref 3.87–5.11)
RDW: 13.2 % (ref 11.5–15.5)
WBC: 10.3 10*3/uL (ref 4.0–10.5)
nRBC: 0.2 % (ref 0.0–0.2)

## 2021-02-19 LAB — GLUCOSE, CAPILLARY
Glucose-Capillary: 93 mg/dL (ref 70–99)
Glucose-Capillary: 118 mg/dL — ABNORMAL HIGH (ref 70–99)
Glucose-Capillary: 139 mg/dL — ABNORMAL HIGH (ref 70–99)
Glucose-Capillary: 142 mg/dL — ABNORMAL HIGH (ref 70–99)
Glucose-Capillary: 264 mg/dL — ABNORMAL HIGH (ref 70–99)

## 2021-02-19 LAB — BETA-HYDROXYBUTYRIC ACID
Beta-Hydroxybutyric Acid: 0.27 mmol/L (ref 0.05–0.27)
Beta-Hydroxybutyric Acid: 0.29 mmol/L — ABNORMAL HIGH (ref 0.05–0.27)

## 2021-02-19 SURGERY — AMPUTATION BELOW KNEE
Anesthesia: Monitor Anesthesia Care | Site: Knee | Laterality: Left

## 2021-02-19 MED ORDER — ASCORBIC ACID 500 MG PO TABS
1000.0000 mg | ORAL_TABLET | Freq: Every day | ORAL | Status: DC
  Administered 2021-02-20 – 2021-03-05 (×14): 1000 mg via ORAL
  Filled 2021-02-19 (×14): qty 2

## 2021-02-19 MED ORDER — POTASSIUM CHLORIDE CRYS ER 20 MEQ PO TBCR: 20.0000 meq | EXTENDED_RELEASE_TABLET | Freq: Every day | ORAL | Status: DC | PRN

## 2021-02-19 MED ORDER — JUVEN PO PACK
1.0000 | PACK | Freq: Two times a day (BID) | ORAL | Status: DC
  Administered 2021-02-20 – 2021-03-01 (×13): 1 via ORAL
  Filled 2021-02-19 (×16): qty 1

## 2021-02-19 MED ORDER — ACETAMINOPHEN 325 MG PO TABS
325.0000 mg | ORAL_TABLET | Freq: Four times a day (QID) | ORAL | Status: DC | PRN
  Administered 2021-03-01 – 2021-03-02 (×2): 650 mg via ORAL
  Filled 2021-02-19 (×2): qty 2

## 2021-02-19 MED ORDER — ALUM & MAG HYDROXIDE-SIMETH 200-200-20 MG/5ML PO SUSP: 15.0000 mL | ORAL | Status: DC | PRN

## 2021-02-19 MED ORDER — ONDANSETRON HCL 4 MG/2ML IJ SOLN
INTRAMUSCULAR | Status: DC | PRN
  Administered 2021-02-19: 4 mg via INTRAVENOUS

## 2021-02-19 MED ORDER — BUPIVACAINE LIPOSOME 1.3 % IJ SUSP
INTRAMUSCULAR | Status: DC | PRN
  Administered 2021-02-19: 7 mL via PERINEURAL
  Administered 2021-02-19: 3 mL via PERINEURAL

## 2021-02-19 MED ORDER — MAGNESIUM SULFATE 2 GM/50ML IV SOLN
2.0000 g | Freq: Every day | INTRAVENOUS | Status: DC | PRN
  Filled 2021-02-19: qty 50

## 2021-02-19 MED ORDER — OXYCODONE HCL 5 MG PO TABS
10.0000 mg | ORAL_TABLET | ORAL | Status: DC | PRN
  Administered 2021-02-20 – 2021-02-22 (×3): 15 mg via ORAL
  Administered 2021-02-22: 10 mg via ORAL
  Administered 2021-02-23 – 2021-02-27 (×17): 15 mg via ORAL
  Administered 2021-02-27: 10 mg via ORAL
  Administered 2021-02-28 – 2021-03-01 (×4): 15 mg via ORAL
  Filled 2021-02-19: qty 2
  Filled 2021-02-19 (×9): qty 3
  Filled 2021-02-19 (×2): qty 2
  Filled 2021-02-19 (×16): qty 3

## 2021-02-19 MED ORDER — POVIDONE-IODINE 10 % EX SWAB: 2.0000 "application " | Freq: Once | CUTANEOUS | Status: DC

## 2021-02-19 MED ORDER — TRANEXAMIC ACID-NACL 1000-0.7 MG/100ML-% IV SOLN
1000.0000 mg | INTRAVENOUS | Status: AC
  Administered 2021-02-19: 1000 mg via INTRAVENOUS
  Filled 2021-02-19: qty 100

## 2021-02-19 MED ORDER — PANTOPRAZOLE SODIUM 40 MG PO TBEC
40.0000 mg | DELAYED_RELEASE_TABLET | Freq: Every day | ORAL | Status: DC
  Administered 2021-02-20 – 2021-03-05 (×14): 40 mg via ORAL
  Filled 2021-02-19 (×14): qty 1

## 2021-02-19 MED ORDER — LABETALOL HCL 5 MG/ML IV SOLN: 10.0000 mg | INTRAVENOUS | Status: DC | PRN

## 2021-02-19 MED ORDER — PROMETHAZINE HCL 25 MG/ML IJ SOLN: 6.2500 mg | INTRAMUSCULAR | Status: DC | PRN

## 2021-02-19 MED ORDER — PROPOFOL 500 MG/50ML IV EMUL
INTRAVENOUS | Status: DC | PRN
  Administered 2021-02-19: 100 ug/kg/min via INTRAVENOUS

## 2021-02-19 MED ORDER — KETAMINE HCL 50 MG/5ML IJ SOSY
PREFILLED_SYRINGE | INTRAMUSCULAR | Status: AC
  Filled 2021-02-19: qty 5

## 2021-02-19 MED ORDER — TRANEXAMIC ACID 1000 MG/10ML IV SOLN
2000.0000 mg | INTRAVENOUS | Status: DC
  Filled 2021-02-19: qty 20

## 2021-02-19 MED ORDER — ZINC SULFATE 220 (50 ZN) MG PO CAPS
220.0000 mg | ORAL_CAPSULE | Freq: Every day | ORAL | Status: AC
  Administered 2021-02-20 – 2021-03-04 (×13): 220 mg via ORAL
  Filled 2021-02-19 (×13): qty 1

## 2021-02-19 MED ORDER — KETAMINE HCL 10 MG/ML IJ SOLN
INTRAMUSCULAR | Status: DC | PRN
  Administered 2021-02-19: 30 mg via INTRAVENOUS

## 2021-02-19 MED ORDER — 0.9 % SODIUM CHLORIDE (POUR BTL) OPTIME
TOPICAL | Status: DC | PRN
  Administered 2021-02-19: 1000 mL

## 2021-02-19 MED ORDER — POTASSIUM CHLORIDE CRYS ER 20 MEQ PO TBCR
40.0000 meq | EXTENDED_RELEASE_TABLET | Freq: Two times a day (BID) | ORAL | Status: DC
  Administered 2021-02-19 – 2021-02-20 (×3): 40 meq via ORAL
  Filled 2021-02-19 (×3): qty 2

## 2021-02-19 MED ORDER — PHENYLEPHRINE HCL-NACL 20-0.9 MG/250ML-% IV SOLN
INTRAVENOUS | Status: AC
  Filled 2021-02-19: qty 250

## 2021-02-19 MED ORDER — GUAIFENESIN-DM 100-10 MG/5ML PO SYRP
15.0000 mL | ORAL_SOLUTION | ORAL | Status: DC | PRN
  Administered 2021-02-20 – 2021-02-22 (×3): 15 mL via ORAL
  Filled 2021-02-19 (×3): qty 15

## 2021-02-19 MED ORDER — CHLORHEXIDINE GLUCONATE 4 % EX LIQD: 60.0000 mL | Freq: Once | CUTANEOUS | Status: DC

## 2021-02-19 MED ORDER — CEFAZOLIN IN SODIUM CHLORIDE 3-0.9 GM/100ML-% IV SOLN
3.0000 g | INTRAVENOUS | Status: AC
  Administered 2021-02-19: 3 g via INTRAVENOUS
  Filled 2021-02-19: qty 100

## 2021-02-19 MED ORDER — BUPIVACAINE HCL (PF) 0.5 % IJ SOLN
INTRAMUSCULAR | Status: DC | PRN
  Administered 2021-02-19: 10 mL via PERINEURAL
  Administered 2021-02-19: 20 mL via PERINEURAL

## 2021-02-19 MED ORDER — AMISULPRIDE (ANTIEMETIC) 5 MG/2ML IV SOLN: 10.0000 mg | Freq: Once | INTRAVENOUS | Status: DC | PRN

## 2021-02-19 MED ORDER — KETAMINE BOLUS VIA INFUSION: 0.5000 mg/kg | Freq: Once | INTRAVENOUS | Status: DC

## 2021-02-19 MED ORDER — POLYETHYLENE GLYCOL 3350 17 G PO PACK: 17.0000 g | PACK | Freq: Every day | ORAL | Status: DC | PRN

## 2021-02-19 MED ORDER — BISACODYL 5 MG PO TBEC
5.0000 mg | DELAYED_RELEASE_TABLET | Freq: Every day | ORAL | Status: DC | PRN
  Administered 2021-02-24: 5 mg via ORAL
  Filled 2021-02-19: qty 1

## 2021-02-19 MED ORDER — FENTANYL CITRATE (PF) 100 MCG/2ML IJ SOLN: 25.0000 ug | INTRAMUSCULAR | Status: DC | PRN

## 2021-02-19 MED ORDER — FENTANYL CITRATE PF 50 MCG/ML IJ SOSY
50.0000 ug | PREFILLED_SYRINGE | Freq: Once | INTRAMUSCULAR | Status: AC
  Administered 2021-02-19: 50 ug via INTRAVENOUS

## 2021-02-19 MED ORDER — INSULIN ASPART 100 UNIT/ML IJ SOLN
2.0000 [IU] | Freq: Three times a day (TID) | INTRAMUSCULAR | Status: DC
Start: 2021-02-19 — End: 2021-02-20
  Administered 2021-02-20 (×2): 2 [IU] via SUBCUTANEOUS

## 2021-02-19 MED ORDER — CHLORHEXIDINE GLUCONATE 0.12 % MT SOLN
OROMUCOSAL | Status: AC
  Administered 2021-02-19: 15 mL
  Filled 2021-02-19: qty 15

## 2021-02-19 MED ORDER — SODIUM CHLORIDE 0.9 % IV SOLN: INTRAVENOUS | Status: DC

## 2021-02-19 MED ORDER — CEFAZOLIN SODIUM-DEXTROSE 2-4 GM/100ML-% IV SOLN
2.0000 g | Freq: Three times a day (TID) | INTRAVENOUS | Status: DC
  Filled 2021-02-19 (×2): qty 100

## 2021-02-19 MED ORDER — ONDANSETRON HCL 4 MG/2ML IJ SOLN
4.0000 mg | Freq: Four times a day (QID) | INTRAMUSCULAR | Status: DC | PRN
  Administered 2021-02-26 – 2021-03-03 (×2): 4 mg via INTRAVENOUS
  Filled 2021-02-19 (×2): qty 2

## 2021-02-19 MED ORDER — FENTANYL CITRATE (PF) 100 MCG/2ML IJ SOLN
INTRAMUSCULAR | Status: AC
  Filled 2021-02-19: qty 2

## 2021-02-19 MED ORDER — MAGNESIUM CITRATE PO SOLN
1.0000 | Freq: Once | ORAL | Status: DC | PRN
  Filled 2021-02-19: qty 296

## 2021-02-19 MED ORDER — ACETAMINOPHEN 500 MG PO TABS
1000.0000 mg | ORAL_TABLET | Freq: Once | ORAL | Status: DC
Start: 2021-02-19 — End: 2021-02-19

## 2021-02-19 MED ORDER — LACTATED RINGERS IV SOLN: INTRAVENOUS | Status: DC | PRN

## 2021-02-19 MED ORDER — METOPROLOL TARTRATE 5 MG/5ML IV SOLN: 2.0000 mg | INTRAVENOUS | Status: DC | PRN

## 2021-02-19 MED ORDER — DOCUSATE SODIUM 100 MG PO CAPS
100.0000 mg | ORAL_CAPSULE | Freq: Every day | ORAL | Status: DC
  Administered 2021-02-20 – 2021-03-03 (×8): 100 mg via ORAL
  Filled 2021-02-19 (×14): qty 1

## 2021-02-19 MED ORDER — HYDRALAZINE HCL 20 MG/ML IJ SOLN: 5.0000 mg | INTRAMUSCULAR | Status: DC | PRN

## 2021-02-19 MED ORDER — PHENOL 1.4 % MT LIQD: 1.0000 | OROMUCOSAL | Status: DC | PRN

## 2021-02-19 MED ORDER — MIDAZOLAM HCL 2 MG/2ML IJ SOLN
INTRAMUSCULAR | Status: AC
  Administered 2021-02-19: 1 mg via INTRAVENOUS
  Filled 2021-02-19: qty 2

## 2021-02-19 MED ORDER — MIDAZOLAM HCL 2 MG/2ML IJ SOLN: 1.0000 mg | Freq: Once | INTRAMUSCULAR | Status: AC

## 2021-02-19 MED ORDER — OXYCODONE HCL 5 MG PO TABS
5.0000 mg | ORAL_TABLET | ORAL | Status: DC | PRN
  Administered 2021-02-20 – 2021-02-27 (×3): 10 mg via ORAL
  Filled 2021-02-19 (×3): qty 2

## 2021-02-19 MED ORDER — HYDROMORPHONE HCL 1 MG/ML IJ SOLN
0.5000 mg | INTRAMUSCULAR | Status: DC | PRN
  Administered 2021-02-20 (×2): 1 mg via INTRAVENOUS
  Administered 2021-02-20: 0.5 mg via INTRAVENOUS
  Administered 2021-02-21 – 2021-02-22 (×4): 1 mg via INTRAVENOUS
  Filled 2021-02-19 (×7): qty 1

## 2021-02-19 SURGICAL SUPPLY — 38 items
BAG COUNTER SPONGE SURGICOUNT (BAG) IMPLANT
BAG SPNG CNTER NS LX DISP (BAG)
BLADE SAW RECIP 87.9 MT (BLADE) ×2 IMPLANT
BLADE SURG 21 STRL SS (BLADE) ×2 IMPLANT
BNDG COHESIVE 6X5 TAN STRL LF (GAUZE/BANDAGES/DRESSINGS) ×1 IMPLANT
CANISTER WOUND CARE 500ML ATS (WOUND CARE) ×2 IMPLANT
COVER SURGICAL LIGHT HANDLE (MISCELLANEOUS) ×2 IMPLANT
CUFF TOURN SGL QUICK 34 (TOURNIQUET CUFF) ×2
CUFF TRNQT CYL 34X4.125X (TOURNIQUET CUFF) ×1 IMPLANT
DRAPE INCISE IOBAN 66X45 STRL (DRAPES) ×2 IMPLANT
DRAPE U-SHAPE 47X51 STRL (DRAPES) ×2 IMPLANT
DRESSING PREVENA PLUS CUSTOM (GAUZE/BANDAGES/DRESSINGS) ×1 IMPLANT
DRSG MEPITEL 4X7.2 (GAUZE/BANDAGES/DRESSINGS) ×2 IMPLANT
DRSG PREVENA PLUS CUSTOM (GAUZE/BANDAGES/DRESSINGS) ×2
DURAPREP 26ML APPLICATOR (WOUND CARE) ×2 IMPLANT
ELECT REM PT RETURN 9FT ADLT (ELECTROSURGICAL) ×2
ELECTRODE REM PT RTRN 9FT ADLT (ELECTROSURGICAL) ×1 IMPLANT
GLOVE SURG ORTHO LTX SZ9 (GLOVE) ×2 IMPLANT
GLOVE SURG UNDER POLY LF SZ9 (GLOVE) ×2 IMPLANT
GOWN STRL REUS W/ TWL XL LVL3 (GOWN DISPOSABLE) ×2 IMPLANT
GOWN STRL REUS W/TWL XL LVL3 (GOWN DISPOSABLE) ×4
KIT BASIN OR (CUSTOM PROCEDURE TRAY) ×2 IMPLANT
KIT TURNOVER KIT B (KITS) ×2 IMPLANT
MANIFOLD NEPTUNE II (INSTRUMENTS) ×2 IMPLANT
NS IRRIG 1000ML POUR BTL (IV SOLUTION) ×2 IMPLANT
PACK ORTHO EXTREMITY (CUSTOM PROCEDURE TRAY) ×2 IMPLANT
PAD ARMBOARD 7.5X6 YLW CONV (MISCELLANEOUS) ×2 IMPLANT
PREVENA RESTOR ARTHOFORM 46X30 (CANNISTER) ×2 IMPLANT
SPONGE T-LAP 18X18 ~~LOC~~+RFID (SPONGE) IMPLANT
STAPLER VISISTAT 35W (STAPLE) ×1 IMPLANT
STOCKINETTE IMPERVIOUS LG (DRAPES) ×2 IMPLANT
SUT ETHILON 2 0 PSLX (SUTURE) ×1 IMPLANT
SUT SILK 2 0 (SUTURE) ×2
SUT SILK 2-0 18XBRD TIE 12 (SUTURE) ×1 IMPLANT
SUT VIC AB 1 CTX 27 (SUTURE) ×4 IMPLANT
TOWEL GREEN STERILE (TOWEL DISPOSABLE) ×2 IMPLANT
TUBE CONNECTING 12X1/4 (SUCTIONS) ×2 IMPLANT
YANKAUER SUCT BULB TIP NO VENT (SUCTIONS) ×2 IMPLANT

## 2021-02-19 NOTE — Consult Note (Signed)
ORTHOPAEDIC CONSULTATION  REQUESTING PHYSICIAN: Reymundo Poll, MD  Chief Complaint: Abscess ulceration pain and swelling left foot.  HPI: Lori Schmidt is a 45 y.o. female who presents with cellulitis and ulceration left foot.  She was admitted yesterday with cellulitis of the left foot. She's been having pain without ulceration for about a month. About a week ago it became bad enough that she could not bear weight. She was seen 11/8 and given ceftriaxone then directed to come to ED yesterday when blood cultures were positive. MRI showed extensive osteo in mid and hindfoot and orthopedic surgery was consulted. She admits to fevers and nausea.   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 Mother is Tally Due   Social Determinants of Health   Financial Resource Strain: Not on file  Food Insecurity: Not on file  Transportation Needs: Not on file  Physical Activity: Not on file  Stress: Not on file  Social Connections: Not on file   Family History  Problem Relation Age of Onset   Hypothyroidism Mother    Diabetes Mother     Diabetes Maternal Grandmother    Breast cancer Paternal Grandmother    - negative except otherwise stated in the family history section Allergies  Allergen Reactions   Sulfa Antibiotics Other (See Comments)    Makes sick   Lisinopril Cough   Prior to Admission medications   Medication Sig Start Date End Date Taking? Authorizing Provider  cephALEXin (KEFLEX) 500 MG capsule Take 500 mg by mouth See admin instructions. Every 6 hours x 10 days   Yes [provider]  citalopram (CELEXA) 40 MG tablet Take 40 mg by mouth at bedtime. 09/03/20  Yes [provider]  ibuprofen (ADVIL) 200 MG tablet Take 600 mg by mouth every 6 (six) hours as needed for headache or moderate pain.   Yes [provider]  insulin detemir (LEVEMIR) 100 UNIT/ML injection Inject 100 Units into the skin at bedtime.   Yes [provider]  JANUVIA 100 MG tablet Take 100 mg by mouth at bedtime. 03/13/18  Yes [provider]  losartan (COZAAR) 25 MG tablet Take 25 mg by mouth at bedtime. 09/03/20 09/03/21 Yes [provider]  magnesium oxide (MAG-OX) 400 MG tablet Take 400 mg by mouth daily.   Yes [provider]  medroxyPROGESTERone Acetate (DEPO-PROVERA IM) Inject into the muscle every 3 (three) months.   Yes [provider]  metFORMIN (GLUCOPHAGE)  1000 MG tablet Take 500 mg by mouth at bedtime. 02/22/18  Yes [provider]  ondansetron (ZOFRAN) 8 MG tablet Take 8 mg by mouth every 8 (eight) hours as needed for nausea or vomiting.   Yes [provider]  pravastatin (PRAVACHOL) 20 MG tablet Take 20 mg by mouth at bedtime. 09/03/20  Yes [provider]  vitamin B-12 (CYANOCOBALAMIN) 500 MCG tablet Take 500 mcg by mouth daily.   Yes [provider]  citalopram (CELEXA) 20 MG tablet Take 20 mg by mouth daily. Patient not taking: Reported on 02/17/2021 03/15/18   [provider]  lisinopril (ZESTRIL) 5 MG tablet Take 5  mg by mouth at bedtime. Patient not taking: No sig reported    [provider]   MR FOOT LEFT W WO CONTRAST  Result Date: 02/18/2021 CLINICAL DATA:  Diabetic foot swelling. EXAM: MRI OF THE LEFT FOOT WITHOUT AND WITH CONTRAST TECHNIQUE: Multiplanar, multisequence MR imaging of the left foot was performed both before and after administration of intravenous contrast. CONTRAST:  19mL GADAVIST GADOBUTROL 1 MMOL/ML IV SOLN COMPARISON:  X-ray 02/17/2021 FINDINGS: Bones/Joint/Cartilage Markedly abnormal appearance of the osseous structures of the left foot with extensive bone marrow edema and enhancement. Extensive confluent low T1 marrow signal changes involving multiple bones, most notably the second, third, and fourth metatarsals as well as the medial, intermediate, and lateral cuneiform bones and the calcaneus and navicular bones. Less prominent signal changes are also present in the cuboid and fifth metatarsal base as well as the talar neck. Bony erosive changes, predominantly along the dorsal aspect of the midfoot. There are extensive joint effusions throughout the hindfoot and midfoot with peripheral rim enhancement, most compatible with septic arthritis. Trace tibiotalar and subtalar joint effusions are nonspecific although septic arthritis is suspected. Ligaments No definite acute ligamentous abnormality. Muscles and Tendons Findings of diffuse myofasciitis and probable myonecrosis throughout the intrinsic foot musculature. Extensive tenosynovial fluid collections involving the flexor and extensor tendons of the foot. Soft tissues Marked circumferential soft tissue swelling. Lobulated fluid collections throughout the left foot, most of which are contiguous with adjacent joint spaces suggesting septic arthritis and associated soft tissue abscesses. IMPRESSION: 1. Extensive acute osteomyelitis throughout the left hindfoot and forefoot with multifocal bony involvement, as detailed above. 2. Numerous  peripherally enhancing joint effusions throughout the left midfoot, many of which are contiguous with rim enhancing fluid collections in the adjacent soft tissues. Findings are compatible with multifocal septic arthritis and adjacent abscesses. 3. Extensive tenosynovial fluid collections throughout the flexor and extensor tendons of the left foot, compatible with infectious tenosynovitis. 4. Small tibiotalar and subtalar joint effusions, may be reactive or reflect septic arthritis. 5. Marked diffuse cellulitis of the left foot. These results will be called to the ordering clinician or representative by the Radiologist Assistant, and communication documented in the PACS or [employer]. Electronically Signed   By: Duanne Guess D.O.   On: 02/18/2021 14:06   MR TIBIA FIBULA LEFT W WO CONTRAST  Result Date: 02/18/2021 CLINICAL DATA:  Diabetic foot swelling.  Lower extremity swelling EXAM: MRI OF LOWER LEFT EXTREMITY WITHOUT AND WITH CONTRAST TECHNIQUE: Multiplanar, multisequence MR imaging of the left tibia and fibula was performed both before and after administration of intravenous contrast. CONTRAST:  39mL GADAVIST GADOBUTROL 1 MMOL/ML IV SOLN COMPARISON:  None. FINDINGS: Bones/Joint/Cartilage No acute fracture or dislocation of the left tibia or fibula. No erosion or site of bone destruction. No bone marrow edema. No periosteal elevation. Ligaments  Grossly intact. Muscles and Tendons Small volume tenosynovial fluid collections associated with the peroneal tendons, tibialis posterior tendon, as well as the flexor hallucis longus and flexor digitorum longus tendons at the posterior aspect of the ankle. Mild edema-like signal within the anterior and posterior compartment musculature of the lower leg. No intramuscular fluid collection. Small amount of deep fascial fluid and edema with enhancement throughout the anterior and posterior compartments of the lower leg. No rim enhancing deep fascial fluid  collection. Soft tissues Diffuse circumferential subcutaneous edema and soft tissue enhancement. No organized or drainable fluid collection within the lower leg. IMPRESSION: 1. Findings of diffuse cellulitis and myofasciitis throughout the left lower leg. No organized or rim-enhancing fluid collections. 2. Small volume tenosynovial fluid collections associated with the peroneal tendons, tibialis posterior tendon, as well as the flexor hallucis longus and flexor digitorum longus tendons at the posterior aspect of the ankle suggesting mild tenosynovitis, which could be reactive or infectious. 3. No evidence of osteomyelitis of the left tibia or fibula. Electronically Signed   By: Duanne Guess D.O.   On: 02/18/2021 13:55   DG Foot Complete Left  Result Date: 02/17/2021 CLINICAL DATA:  Left foot infection. EXAM: LEFT FOOT - COMPLETE 3+ VIEW COMPARISON:  None. FINDINGS: No acute fracture or dislocation. Irregularity of the dorsal cortex of a tarsal bone, likely medial cuneiform. This appears chronic. An infectious process/osteomyelitis is less likely but not excluded clinical correlation recommended there is diffuse soft tissue swelling and subcutaneous edema of the dorsum of the foot. No radiopaque foreign object or soft tissue gas. IMPRESSION: 1. No acute fracture or dislocation. 2. Diffuse soft tissue swelling and subcutaneous edema of the dorsum of the foot. Findings may represent cellulitis. Clinical correlation is recommended. 3. Irregularity of the dorsal cortex of a tarsal bone, likely medial cuneiform. Electronically Signed   By: Elgie Collard M.D.   On: 02/17/2021 19:48   ECHOCARDIOGRAM COMPLETE  Result Date: 02/18/2021    ECHOCARDIOGRAM REPORT   Patient Name:   Lori Schmidt [facility] Date of Exam: 02/18/2021 Medical Rec #:  [id number]             Height:       67.0 in Accession #:    [id number]            Weight:       267.2 lb Date of Birth:  06-Mar-1976             BSA:          2.287 m Patient  Age:    45 years              BP:           117/47 mmHg Patient Gender: F                     HR:           100 bpm. Exam Location:  Inpatient Procedure: 2D Echo, Cardiac Doppler and Color Doppler Indications:    Syncope R55  History:        Patient has no prior history of Echocardiogram examinations.  Sonographer:    Roosvelt Maser RDCS Referring Phys: [id number] CAROLYN GUILLOUD IMPRESSIONS  1. Left ventricular ejection fraction, by estimation, is 60 to 65%. The left ventricle has normal function. The left ventricle has no regional wall motion abnormalities. There is mild asymmetric left ventricular hypertrophy of the posterior segment. Left ventricular diastolic parameters were normal.  2. Right  ventricular systolic function is normal. The right ventricular size is normal.  3. The mitral valve is normal in structure. No evidence of mitral valve regurgitation. No evidence of mitral stenosis.  4. The aortic valve is normal in structure. Aortic valve regurgitation is not visualized. No aortic stenosis is present.  5. The inferior vena cava is normal in size with greater than 50% respiratory variability, suggesting right atrial pressure of 3 mmHg. FINDINGS  Left Ventricle: Left ventricular ejection fraction, by estimation, is 60 to 65%. The left ventricle has normal function. The left ventricle has no regional wall motion abnormalities. The left ventricular internal cavity size was normal in size. There is  mild asymmetric left ventricular hypertrophy of the posterior segment. Left ventricular diastolic parameters were normal. Right Ventricle: The right ventricular size is normal. No increase in right ventricular wall thickness. Right ventricular systolic function is normal. Left Atrium: Left atrial size was normal in size. Right Atrium: Right atrial size was normal in size. Pericardium: There is no evidence of pericardial effusion. Mitral Valve: The mitral valve is normal in structure. No evidence of mitral valve  regurgitation. No evidence of mitral valve stenosis. Tricuspid Valve: The tricuspid valve is normal in structure. Tricuspid valve regurgitation is trivial. No evidence of tricuspid stenosis. Aortic Valve: The aortic valve is normal in structure. Aortic valve regurgitation is not visualized. No aortic stenosis is present. Aortic valve mean gradient measures 4.0 mmHg. Aortic valve peak gradient measures 7.4 mmHg. Aortic valve area, by VTI measures 2.83 cm. Pulmonic Valve: The pulmonic valve was normal in structure. Pulmonic valve regurgitation is not visualized. No evidence of pulmonic stenosis. Aorta: The aortic root is normal in size and structure. Venous: The inferior vena cava is normal in size with greater than 50% respiratory variability, suggesting right atrial pressure of 3 mmHg. IAS/Shunts: No atrial level shunt detected by color flow Doppler.  LEFT VENTRICLE PLAX 2D LVIDd:         4.80 cm   Diastology LVIDs:         3.40 cm   LV e' medial:    12.30 cm/s LV PW:         1.20 cm   LV E/e' medial:  10.6 LV IVS:        1.00 cm   LV e' lateral:   10.80 cm/s LVOT diam:     2.20 cm   LV E/e' lateral: 12.0 LV SV:         59 LV SV Index:   26 LVOT Area:     3.80 cm  RIGHT VENTRICLE RV Basal diam:  3.50 cm LEFT ATRIUM             Index        RIGHT ATRIUM           Index LA diam:        3.00 cm 1.31 cm/m   RA Area:     20.70 cm LA Vol (A2C):   61.7 ml 26.98 ml/m  RA Volume:   58.40 ml  25.53 ml/m LA Vol (A4C):   41.9 ml 18.32 ml/m LA Biplane Vol: 55.2 ml 24.13 ml/m  AORTIC VALVE AV Area (Vmax):    2.85 cm AV Area (Vmean):   2.83 cm AV Area (VTI):     2.83 cm AV Vmax:           136.00 cm/s AV Vmean:          93.800 cm/s AV VTI:  0.207 m AV Peak Grad:      7.4 mmHg AV Mean Grad:      4.0 mmHg LVOT Vmax:         102.00 cm/s LVOT Vmean:        69.800 cm/s LVOT VTI:          0.154 m LVOT/AV VTI ratio: 0.74  AORTA Ao Root diam: 3.60 cm Ao Asc diam:  3.40 cm MITRAL VALVE MV Area (PHT): 4.63 cm     SHUNTS  MV Decel Time: 164 msec     Systemic VTI:  0.15 m MV E velocity: 130.00 cm/s  Systemic Diam: 2.20 cm MV A velocity: 102.00 cm/s MV E/A ratio:  1.27 Kardie Tobb DO Electronically signed by Thomasene Ripple DO Signature Date/Time: 02/18/2021/3:15:05 PM    Final    - pertinent xrays, CT, MRI studies were reviewed and independently interpreted  Positive ROS: All other systems have been reviewed and were otherwise negative with the exception of those mentioned in the HPI and as above.  Physical Exam: General: Alert, no acute distress Psychiatric: Patient is competent for consent with normal mood and affect Lymphatic: No axillary or cervical lymphadenopathy Cardiovascular: No pedal edema Respiratory: No cyanosis, no use of accessory musculature GI: No organomegaly, abdomen is soft and non-tender    Images:  @ENCIMAGES @  Labs:  Lab Results  Component Value Date   REPTSTATUS PENDING 02/16/2021   CULT GRAM POSITIVE COCCI 02/16/2021    Lab Results  Component Value Date   ALBUMIN 2.4 (L) 02/16/2021     CBC EXTENDED Latest Ref Rng & Units 02/19/2021 02/18/2021 02/17/2021  WBC 4.0 - 10.5 K/uL 10.3 6.7 8.6  RBC 3.87 - 5.11 MIL/uL 3.47(L) 3.65(L) 3.79(L)  HGB 12.0 - 15.0 g/dL 9.4(B) 10.3(L) 10.8(L)  HCT 36.0 - 46.0 % 28.5(L) 29.7(L) 31.7(L)  PLT 150 - 400 K/uL 191 158 175  NEUTROABS 1.7 - 7.7 K/uL - - 8.1(H)  LYMPHSABS 0.7 - 4.0 K/uL - - 0.3(L)    Neurologic: Patient does not have protective sensation bilateral lower extremities.   MUSCULOSKELETAL:   Skin: Examination patient has ulceration both plantarly dorsally medially and laterally of the left foot there is weeping drainage.  There is significant swelling of the foot with cellulitis tenderness to palpation.  Patient has cellulitis and swelling of the left leg as well.  Examination the right lower extremity patient has a palpable dorsalis pedis pulse without swelling or cellulitis in the right lower extremity no ulcerations.  I  cannot palpate a pulse in the left foot secondary to the swelling.  Review of the MRI scan shows extensive osteomyelitis throughout the midfoot and hindfoot with essentially complete involvement of the calcaneus.  Patient has an elevated white cell count of 10.3 hemoglobin 9.8.  Severe protein caloric malnutrition with an albumin of 2.4.  Systemic cultures are showing gram-positive cocci.  Assessment: Assessment: Extensive osteomyelitis of the left foot with cellulitis abscess and drainage.  Plan: Discussed with the patient that the best medical treatment would be to proceed with a transtibial amputation risks and benefits were discussed including the potential for an infection extending up into the calf.  Patient states she understands would like to proceed with surgery after she discusses this with her mother.  Anticipate surgery this afternoon.  Orders placed for n.p.o. at this time.  Thank you for the consult and the opportunity to see Ms. Thornell Sartorius, MD [facility] [phone] 7:29 AM

## 2021-02-19 NOTE — Transfer of Care (Signed)
Immediate Anesthesia Transfer of Care Note  Patient: Lori Schmidt  Procedure(s) Performed: AMPUTATION BELOW KNEE (Left: Knee)  Patient Location: PACU  Anesthesia Type:MAC and Regional  Level of Consciousness: awake, alert , oriented and patient cooperative  Airway & Oxygen Therapy: Patient Spontanous Breathing and Patient connected to face mask oxygen  Post-op Assessment: Report given to RN, Post -op Vital signs reviewed and stable and Patient moving all extremities  Post vital signs: Reviewed and stable  Last Vitals:  Vitals Value Taken Time  BP 115/57 02/19/21 1739  Temp 37.1 C 02/19/21 1738  Pulse 103 02/19/21 1740  Resp 21 02/19/21 1740  SpO2 93 % 02/19/21 1740  Vitals shown include unvalidated device data.  Last Pain:  Vitals:   02/19/21 1520  TempSrc:   PainSc: Asleep      Patients Stated Pain Goal: 2 (05/23/15 3567)  Complications: No notable events documented.

## 2021-02-19 NOTE — Anesthesia Procedure Notes (Signed)
Anesthesia Regional Block: Popliteal block   Pre-Anesthetic Checklist: , timeout performed,  Correct Patient, Correct Site, Correct Laterality,  Correct Procedure, Correct Position, site marked,  Risks and benefits discussed,  Surgical consent,  Pre-op evaluation,  At surgeon's request and post-op pain management  Laterality: Left  Prep: chloraprep       Needles:  Injection technique: Single-shot  Needle Type: Echogenic Stimulator Needle          Additional Needles:   Procedures:,,,, ultrasound used (permanent image in chart),,    Narrative:  Start time: 02/19/2021 4:04 PM End time: 02/19/2021 4:14 PM Injection made incrementally with aspirations every 5 mL.  Performed by: Personally  Anesthesiologist: Heather Roberts, MD  Additional Notes: A functioning IV was confirmed and monitors were applied.  Sterile prep and drape, hand hygiene and sterile gloves were used.  Negative aspiration and test dose prior to incremental administration of local anesthetic. The patient tolerated the procedure well.Ultrasound  guidance: relevant anatomy identified, needle position confirmed, local anesthetic spread visualized around nerve(s), vascular puncture avoided.  Image printed for medical record.

## 2021-02-19 NOTE — Progress Notes (Signed)
   02/19/21 1000  Clinical Encounter Type  Visited With Patient and family together  Visit Type Initial;Psychological support;Social support;Pre-op  Referral From Nurse  Stress Factors  Patient Stress Factors Major life changes;Health changes   CH visited pt. per [facility] consult for support in light of potential need for foot amputation.  Pt. lying in bed with dtr., so, and mother at bedside when [facility] arrived.  Pt. shared that she came to [facility] two days ago with severe swelling in her foot that was determined to be caused by infection which has spread to the bone; pt. says she will need to have a partial amputation of her leg in order to stop the infection from spreading.  Pt. verbalized that it has been a shock to hear that she will need to undergo this amputation; she expressed concern re: how this will affect her mobility and active lifestyle, but she is most worried about the pain she may experience as she recovers from surgery.  Pt. became drowsy mid visit, but shared no immediate needs; pt.'s mother asked if it would be possible for someone who has undergone a similar amputation to come talk w/pt. post-op.  CH will look into this.  Chaplains remain available for support as needed.

## 2021-02-19 NOTE — Anesthesia Procedure Notes (Signed)
Procedure Name: MAC Date/Time: 02/19/2021 4:56 PM Performed by: Annamary Carolin, CRNA Pre-anesthesia Checklist: Patient identified, Emergency Drugs available, Suction available and Patient being monitored Patient Re-evaluated:Patient Re-evaluated prior to induction Preoxygenation: Pre-oxygenation with 100% oxygen Induction Type: IV induction Dental Injury: Teeth and Oropharynx as per pre-operative assessment

## 2021-02-19 NOTE — Op Note (Signed)
   Date of Surgery: 02/19/2021  INDICATIONS: Lori Schmidt is a 45 y.o.-year-old female who presents with osteomyelitis abscess and ulceration involving the entire left foot.  Symptoms for over a month.Marland Kitchen  PREOPERATIVE DIAGNOSIS: Osteomyelitis abscess and ulceration and cellulitis left foot  POSTOPERATIVE DIAGNOSIS: Same.  PROCEDURE: Transtibial amputation Application of Prevena wound VAC  SURGEON: Lajoyce Corners, M.D.  ANESTHESIA:  general  IV FLUIDS AND URINE: See anesthesia records.  ESTIMATED BLOOD LOSS: See anesthesia records.  COMPLICATIONS: None.  DESCRIPTION OF PROCEDURE: The patient was brought to the operating room after undergoing regional anesthetic. After adequate levels of anesthesia were obtained patient's lower extremity was prepped using DuraPrep draped into a sterile field. A timeout was called. The foot was draped out of the sterile field with impervious stockinette. A transverse incision was made 11 cm distal to the tibial tubercle. This curved proximally and a large posterior flap was created. The tibia was transected 1 cm proximal to the skin incision. The fibula was transected just proximal to the tibial incision. The tibia was beveled anteriorly. A large posterior flap was created. The sciatic nerve was pulled cut and allowed to retract. The vascular bundles were suture ligated with 2-0 silk. The deep and superficial fascial layers were closed using #1 Vicryl. The skin was closed using staples and 2-0 nylon. The wound was covered with a Prevena customizable and arthroform wound VAC.  The dressing was sealed with dermatac there was a good suction fit. A prosthetic shrinker and limb protector were applied. Patient was taken to the PACU in stable condition.   DISCHARGE PLANNING:  Antibiotic duration: 24 hours  Weightbearing: Nonweightbearing on the operative extremity  Pain medication: Opioid pathway  Dressing care/ Wound VAC: Continue wound VAC for 1 week after  discharge  Discharge to: Discharge planning based on therapy's recommendations for possible inpatient rehabilitation, outpatient rehabilitation, or discharge to home with therapy  Follow-up: In the office 1 week post operative.  Lori Baker, MD [facility] 5:54 PM

## 2021-02-19 NOTE — Progress Notes (Signed)
HD#2 SUBJECTIVE:  Patient Summary: Lori Schmidt is a 45 y.o. with a pertinent PMH of HTN, poorly controlled T2DM, HLD, and depression who presented with LE erythema and swelling and admitted for MSSA bacteremia and osteomyelitis of left foot.   Overnight Events: No acute events overnight  Interim History: This is hospital day 2 for Lori Schmidt who was seen and evaluated at the bedside this morning. She is very scared about going through the pain. She is adaptable and motivated about working with rehab after, but is just nervous. She states her pain has been well controlled thus far.    OBJECTIVE:  Vital Signs: Vitals:   02/18/21 2118 02/18/21 2130 02/19/21 0034 02/19/21 0421  BP: 116/64 118/67 117/71 110/69  Pulse: (!) 110 (!) 112 (!) 109 (!) 107  Resp: 20 20 18 18   Temp: 98.8 F (37.1 C) 98.8 F (37.1 C) 99.8 F (37.7 C) 99.1 F (37.3 C)  TempSrc:  Oral Oral Oral  SpO2: 94% 92% 90% 94%  Weight:      Height:       Supplemental O2: Room Air SpO2: 94 % O2 Flow Rate (L/min):  (placed on 2L oxygen)  Filed Weights   02/18/21 0118  Weight: 121.2 kg     Intake/Output Summary (Last 24 hours) at 02/19/2021 0622 Last data filed at 02/18/2021 1732 Gross per 24 hour  Intake 676.92 ml  Output 950 ml  Net -273.08 ml   Net IO Since Admission: 1,931.87 mL [02/19/21 0622]  Physical Exam: General:  No acute distress. Head: Normocephalic. Atraumatic. CV: Tachycardic rate, regular rhythm. No murmurs, rubs, or gallops.  Pulmonary: Lungs CTAB. Normal effort. No wheezing or rales. Abdominal: Soft, nontender, nondistended. Normal bowel sounds. Extremities: Significant swelling of left lower extremity (foot) and erythema, with drainage soaking bed sheet.  Skin: Warm and dry. No obvious rash or lesions. Neuro: A&Ox3. No focal deficit. Psych: Upset/scared mood      ASSESSMENT/PLAN:  Assessment: Active Problems:   Bacteremia due to Staphylococcus aureus    Diabetic osteomyelitis (HCC)   Left leg cellulitis   Plan:  #MSSA bacteremia #Diabetic osteomyelitis of left foot Left foot remains swollen and erythematous today, with drainage noted that is soaking the bed sheet. MRI yesterday showed extensive osteomyelitis throughout left hindfoot and forefoot with multifocal bony involvement. Also noted to have numerous joint effusions which are contiguous with rim enhancing fluid collections in adjacent soft tissue, consistent with septic arthritis and adjacent abscesses. MRI also commented on extensive tenosynovial fluid collections, suspicious for infectious tenosynovitis. TTE was negative for endocarditis, however, TEE is still recommended.  - ID consulted, appreciate recommendations - Ortho consulted, appreciate recommendations - Continue ancef q8h - Plan for transtibial amputation this afternoon with Dr. 13/11/22 - Follow up blood cultures - Oxycodone 5 mg q4h prn for pain, could increase this after surgery - Consider CIR - Support group for people with prosthetics?  #T2DM A1c >13. Anion gaps and BHB have remained within normal limits and patient clinically does not have any symptoms of DKA still. She denies any n/v, abd pain, polyuria, or polydipsia. Discussed importance of blood glucose control given infectious state and also to promote wound healing after her surgery. Likely will need diabetes regimen adjusted on discharge.  - Levemir 50u bid and Novolog 2u tid with meals + SSI - Continue CBG monitoring q4 - Monitor BMP  #Mildly elevated beta hCG  Patient receives Depo-Provera injections, most recently received one on 9/27. No prior beta-hCG  levels to compare to so unclear if it is typically slightly elevated for her. Repeat beta-hCG is still pending and will continue to monitor this.   Best Practice: Diet: NPO IVF: Fluids: NS, Rate: 75 cc/hr VTE: Lovenox Code: Full AB: Ancef Therapy Recs: Pending Family Contact: family, at  bedside. DISPO: Anticipated discharge pending  surgery and clinical improvement .  Signature: Elza Rafter, D.O.  Internal Medicine Resident, PGY-1 [facility]  Pager: [phone] 6:22 AM, 02/19/2021   Please contact the on call pager after 5 pm and on weekends at [phone].

## 2021-02-19 NOTE — Anesthesia Preprocedure Evaluation (Addendum)
Anesthesia Evaluation  Patient identified by MRN, date of birth, ID band Patient awake    Reviewed: Allergy & Precautions, NPO status , Patient's Chart, lab work & pertinent test results  History of Anesthesia Complications Negative for: history of anesthetic complications  Airway Mallampati: II  TM Distance: >3 FB Neck ROM: Full    Dental  (+) Dental Advisory Given   Pulmonary neg pulmonary ROS, former smoker,    Pulmonary exam normal        Cardiovascular hypertension, Pt. on medications Normal cardiovascular exam  IMPRESSIONS    1. Left ventricular ejection fraction, by estimation, is 60 to 65%. The  left ventricle has normal function. The left ventricle has no regional  wall motion abnormalities. There is mild asymmetric left ventricular  hypertrophy of the posterior segment.  Left ventricular diastolic parameters were normal.  2. Right ventricular systolic function is normal. The right ventricular  size is normal.  3. The mitral valve is normal in structure. No evidence of mitral valve  regurgitation. No evidence of mitral stenosis.  4. The aortic valve is normal in structure. Aortic valve regurgitation is  not visualized. No aortic stenosis is present.  5. The inferior vena cava is normal in size with greater than 50%  respiratory variability, suggesting right atrial pressure of 3 mmHg.    Neuro/Psych negative neurological ROS     GI/Hepatic negative GI ROS, Neg liver ROS,   Endo/Other  diabetesMorbid obesity  Renal/GU negative Renal ROS     Musculoskeletal negative musculoskeletal ROS (+)   Abdominal   Peds  Hematology negative hematology ROS (+)   Anesthesia Other Findings   Reproductive/Obstetrics                            Anesthesia Physical Anesthesia Plan  ASA: 3  Anesthesia Plan: MAC and Regional   Post-op Pain Management:    Induction:   PONV Risk Score  and Plan: 3 and Ondansetron, Dexamethasone and Midazolam  Airway Management Planned: Simple Face Mask and Natural Airway  Additional Equipment:   Intra-op Plan:   Post-operative Plan:   Informed Consent: I have reviewed the patients History and Physical, chart, labs and discussed the procedure including the risks, benefits and alternatives for the proposed anesthesia with the patient or authorized representative who has indicated his/her understanding and acceptance.     Dental advisory given  Plan Discussed with: Anesthesiologist and CRNA  Anesthesia Plan Comments: (Pt denies possibility of pregnancy.)      Anesthesia Quick Evaluation

## 2021-02-19 NOTE — Progress Notes (Signed)
[facility]  Date of Admission:  02/17/2021    Total days of antibiotics 2 Cefazolin 11/10 -          ASSESSMENT: Lori Schmidt is a 45 year old female with uncontrolled type 2 diabetes here with left lower extremity cellulitis, osteomyelitis of left foot , and MSSA bacteremia. Surgery has consulted and patient endorses she is ready for left BKA today. RN notified and will inform surgical team. Patient made NPO. No endocarditis on TEE, recommend pursuing TEE after surgery. Presuming good source control with surgery , antibiotic course dependent on TEE results.   MSSA bacteremia secondary to left lower extremity osteomyelitis and extensive cellulitis  Uncontrolled Type 2 Diabetes Mellitus  Normocytic Anemia, stable 9.8 Elevated Beta HCG on Istat, repeat pending  PLAN: -Continue Cefazolin -Plan for [name] with orthopedic surgeon today -F/u repeat blood cultures -F/u Beta HCG -Recommend TEE  Active Problems:   Bacteremia due to Staphylococcus aureus   Diabetic osteomyelitis (HCC)   Left leg cellulitis   Subacute osteomyelitis, left ankle and foot (HCC)    citalopram  40 mg Oral Daily   enoxaparin (LOVENOX) injection  60 mg Subcutaneous Q24H   insulin aspart  0-20 Units Subcutaneous TID WC   insulin aspart  0-5 Units Subcutaneous QHS   insulin aspart  2 Units Subcutaneous TID WC   insulin detemir  50 Units Subcutaneous BID   potassium chloride  40 mEq Oral BID   [START ON 02/20/2021] vitamin B-12  500 mcg Oral QODAY    SUBJECTIVE: Patient has lumbosacral back pain overnight. Endorsed remote episodes of chronic back pain which is similar. She felt hot overnight and given a fan. Dr.Duda discussed surgical recommendations this morning and patients sister and mother in room. They have discussed the surgery and patient would like to go ahead with surgery today. No other acute concerns.   Allergies  Allergen Reactions   Sulfa  Antibiotics Other (See Comments)    Makes sick   Lisinopril Cough    OBJECTIVE: Vitals:   02/18/21 2118 02/18/21 2130 02/19/21 0034 02/19/21 0421  BP: 116/64 118/67 117/71 110/69  Pulse: (!) 110 (!) 112 (!) 109 (!) 107  Resp: 20 20 18 18   Temp: 98.8 F (37.1 C) 98.8 F (37.1 C) 99.8 F (37.7 C) 99.1 F (37.3 C)  TempSrc:  Oral Oral Oral  SpO2: 94% 92% 90% 94%  Weight:      Height:       Body mass index is 41.85 kg/m.  Physical Exam Constitutional:      General: She is not in acute distress.    Appearance: She is obese. She is ill-appearing and diaphoretic.  Cardiovascular:     Rate and Rhythm: Tachycardia present.  Musculoskeletal:        General: Swelling present.     Left lower leg: Edema present.     Comments: No focal tenderness over spine, fluctuance, or erythema  Neurological:     Mental Status: She is alert.     Motor: Weakness (generalize weakness, unable to sit up without assistance) present.  Psychiatric:        Mood and Affect: Mood normal.    Lab Results Lab Results  Component Value Date   WBC 10.3 02/19/2021   HGB 9.8 (L) 02/19/2021   HCT 28.5 (L) 02/19/2021   MCV 82.1 02/19/2021   PLT 191 02/19/2021    Lab Results  Component  Value Date   CREATININE 0.43 (L) 02/19/2021   BUN 6 02/19/2021   NA 129 (L) 02/19/2021   K 3.5 02/19/2021   CL 98 02/19/2021   CO2 22 02/19/2021    Lab Results  Component Value Date   ALT 27 02/16/2021   AST 27 02/16/2021   ALKPHOS 113 02/16/2021   BILITOT 1.4 (H) 02/16/2021     Microbiology: Recent Results (from the past 240 hour(s))  Resp Panel by RT-PCR (Flu A&B, Covid) Nasopharyngeal Swab     Status: None   Collection Time: 02/16/21  5:37 PM   Specimen: Nasopharyngeal Swab; Nasopharyngeal(NP) swabs in vial transport medium  Result Value Ref Range Status   SARS Coronavirus 2 by RT PCR NEGATIVE NEGATIVE Final    Comment: (NOTE) SARS-CoV-2 target nucleic acids are NOT DETECTED.  The SARS-CoV-2 RNA is  generally detectable in upper respiratory specimens during the acute phase of infection. The lowest concentration of SARS-CoV-2 viral copies this assay can detect is 138 copies/mL. A negative result does not preclude SARS-Cov-2 infection and should not be used as the sole basis for treatment or other patient management decisions. A negative result may occur with  improper specimen collection/handling, submission of specimen other than nasopharyngeal swab, presence of viral mutation(s) within the areas targeted by this assay, and inadequate number of viral copies(<138 copies/mL). A negative result must be combined with clinical observations, patient history, and epidemiological information. The expected result is Negative.  Fact Sheet for Patients:  BloggerCourse.com  Fact Sheet for Healthcare Providers:  SeriousBroker.it  This test is no t yet approved or cleared by the [place] FDA and  has been authorized for detection and/or diagnosis of SARS-CoV-2 by FDA under an Emergency Use Authorization (EUA). This EUA will remain  in effect (meaning this test can be used) for the duration of the COVID-19 declaration under Section 564(b)(1) of the Act, 21 U.S.C.section 360bbb-3(b)(1), unless the authorization is terminated  or revoked sooner.       Influenza A by PCR NEGATIVE NEGATIVE Final   Influenza B by PCR NEGATIVE NEGATIVE Final    Comment: (NOTE) The Xpert Xpress SARS-CoV-2/FLU/RSV plus assay is intended as an aid in the diagnosis of influenza from Nasopharyngeal swab specimens and should not be used as a sole basis for treatment. Nasal washings and aspirates are unacceptable for Xpert Xpress SARS-CoV-2/FLU/RSV testing.  Fact Sheet for Patients: BloggerCourse.com  Fact Sheet for Healthcare Providers: SeriousBroker.it  This test is not yet approved or cleared by the [place] FDA and has been authorized for detection and/or diagnosis of SARS-CoV-2 by FDA under an Emergency Use Authorization (EUA). This EUA will remain in effect (meaning this test can be used) for the duration of the COVID-19 declaration under Section 564(b)(1) of the Act, 21 U.S.C. section 360bbb-3(b)(1), unless the authorization is terminated or revoked.  Performed at [facility], [address] [place], [place] [address]   Culture, blood (routine x 2)     Status: None (Preliminary result)   Collection Time: 02/16/21  5:45 PM   Specimen: Site Not Specified; Blood  Result Value Ref Range Status   Specimen Description SITE NOT SPECIFIED  Final   Special Requests   Final    BOTTLES DRAWN AEROBIC AND ANAEROBIC Blood Culture adequate volume   Culture  Setup Time   Final    GRAM POSITIVE COCCI IN CLUSTERS IN BOTH AEROBIC AND ANAEROBIC BOTTLES CRITICAL RESULT CALLED TO, READ BACK BY AND VERIFIED WITH: RN  Solmon [facility] [id number] AT 1307 BY CM Performed at [facility], [address] [place], [place] [address]    Culture GRAM POSITIVE COCCI  Final   Report Status PENDING  Incomplete  Blood Culture ID Panel (Reflexed)     Status: Abnormal   Collection Time: 02/16/21  5:45 PM  Result Value Ref Range Status   Enterococcus faecalis NOT DETECTED NOT DETECTED Final   Enterococcus Faecium NOT DETECTED NOT DETECTED Final   Listeria monocytogenes NOT DETECTED NOT DETECTED Final   Staphylococcus species DETECTED (A) NOT DETECTED Final    Comment: CRITICAL RESULT CALLED TO, READ BACK BY AND VERIFIED WITH: RN J BAILEY MCHP [id number] AT 1307 BY CM     Staphylococcus aureus (BCID) DETECTED (A) NOT DETECTED Final    Comment: CRITICAL RESULT CALLED TO, READ BACK BY AND VERIFIED WITH: RN J BAILEY M[facility]P [id number] AT 1307 BY CM    Staphylococcus epidermidis NOT DETECTED NOT DETECTED Final   Staphylococcus lugdunensis NOT DETECTED NOT DETECTED Final   Streptococcus species NOT DETECTED NOT  DETECTED Final   Streptococcus agalactiae NOT DETECTED NOT DETECTED Final   Streptococcus pneumoniae NOT DETECTED NOT DETECTED Final   Streptococcus pyogenes NOT DETECTED NOT DETECTED Final   A.calcoaceticus-baumannii NOT DETECTED NOT DETECTED Final   Bacteroides fragilis NOT DETECTED NOT DETECTED Final   Enterobacterales NOT DETECTED NOT DETECTED Final   Enterobacter cloacae complex NOT DETECTED NOT DETECTED Final   Escherichia coli NOT DETECTED NOT DETECTED Final   Klebsiella aerogenes NOT DETECTED NOT DETECTED Final   Klebsiella oxytoca NOT DETECTED NOT DETECTED Final   Klebsiella pneumoniae NOT DETECTED NOT DETECTED Final   Proteus species NOT DETECTED NOT DETECTED Final   Salmonella species NOT DETECTED NOT DETECTED Final   Serratia marcescens NOT DETECTED NOT DETECTED Final   Haemophilus influenzae NOT DETECTED NOT DETECTED Final   Neisseria meningitidis NOT DETECTED NOT DETECTED Final   Pseudomonas aeruginosa NOT DETECTED NOT DETECTED Final   Stenotrophomonas maltophilia NOT DETECTED NOT DETECTED Final   Candida albicans NOT DETECTED NOT DETECTED Final   Candida auris NOT DETECTED NOT DETECTED Final   Candida glabrata NOT DETECTED NOT DETECTED Final   Candida krusei NOT DETECTED NOT DETECTED Final   Candida parapsilosis NOT DETECTED NOT DETECTED Final   Candida tropicalis NOT DETECTED NOT DETECTED Final   Cryptococcus neoformans/gattii NOT DETECTED NOT DETECTED Final   Meth resistant mecA/C and MREJ NOT DETECTED NOT DETECTED Final    Comment: Performed at [facility], [address], [place], [place] [address]  Resp Panel by RT-PCR (Flu A&B, Covid) Nasopharyngeal Swab     Status: None   Collection Time: 02/17/21  8:39 PM   Specimen: Nasopharyngeal Swab; Nasopharyngeal(NP) swabs in vial transport medium  Result Value Ref Range Status   SARS Coronavirus 2 by RT PCR NEGATIVE NEGATIVE Final    Comment: (NOTE) SARS-CoV-2 target nucleic acids are NOT DETECTED.  The  SARS-CoV-2 RNA is generally detectable in upper respiratory specimens during the acute phase of infection. The lowest concentration of SARS-CoV-2 viral copies this assay can detect is 138 copies/mL. A negative result does not preclude SARS-Cov-2 infection and should not be used as the sole basis for treatment or other patient management decisions. A negative result may occur with  improper specimen collection/handling, submission of specimen other than nasopharyngeal swab, presence of viral mutation(s) within the areas targeted by this assay, and inadequate number of viral copies(<138 copies/mL). A negative result must be combined  with clinical observations, patient history, and epidemiological information. The expected result is Negative.  Fact Sheet for Patients:  BloggerCourse.com  Fact Sheet for Healthcare Providers:  SeriousBroker.it  This test is no t yet approved or cleared by the [place] FDA and  has been authorized for detection and/or diagnosis of SARS-CoV-2 by FDA under an Emergency Use Authorization (EUA). This EUA will remain  in effect (meaning this test can be used) for the duration of the COVID-19 declaration under Section 564(b)(1) of the Act, 21 U.S.C.section 360bbb-3(b)(1), unless the authorization is terminated  or revoked sooner.       Influenza A by PCR NEGATIVE NEGATIVE Final   Influenza B by PCR NEGATIVE NEGATIVE Final    Comment: (NOTE) The Xpert Xpress SARS-CoV-2/FLU/RSV plus assay is intended as an aid in the diagnosis of influenza from Nasopharyngeal swab specimens and should not be used as a sole basis for treatment. Nasal washings and aspirates are unacceptable for Xpert Xpress SARS-CoV-2/FLU/RSV testing.  Fact Sheet for Patients: BloggerCourse.com  Fact Sheet for Healthcare Providers: SeriousBroker.it  This test is not yet approved or  cleared by the [place] FDA and has been authorized for detection and/or diagnosis of SARS-CoV-2 by FDA under an Emergency Use Authorization (EUA). This EUA will remain in effect (meaning this test can be used) for the duration of the COVID-19 declaration under Section 564(b)(1) of the Act, 21 U.S.C. section 360bbb-3(b)(1), unless the authorization is terminated or revoked.  Performed at [facility], [address], [place], [place] [address]     Thurmon Fair, MD PGY3 Internal Medicine [phone]

## 2021-02-19 NOTE — Progress Notes (Signed)
Spoke with patient at the bedside about her diabetes. States that she was diagnosed about 14 years ago. States that she takes Levemir 100 units at bedtime, but does forget to take some. States that she has checked blood sugars at home, not consistently, but usually CBGs are "high". Recently saw a PA and NP for her diabetes, but was sent to hospital to have ultrasound done for possible DVT. Has not seen a physician for some time. Would like to find a new PCP after discharge. States that her last A1C was around 13%.  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 She is concerned about her job in the future and how she will manage. Patient states that she does drink a lot of regular sodas for the caffeine and sugar. She knows that she should cut back on these. She does not eat at work and is usually very tired when she gets home. Her 55 year old daughter lives with her.   We discussed changes that can be made gradually with diet. Patient expressed fear due to her upcoming surgery later today. We will be watching her blood sugars while she is in the hospital. Will follow patient and will follow up with her after surgery.   Smith Mince RN BSN CDE Diabetes Coordinator Pager: [phone]  8am-5pm

## 2021-02-20 ENCOUNTER — Inpatient Hospital Stay (HOSPITAL_COMMUNITY): Payer: Managed Care, Other (non HMO)

## 2021-02-20 ENCOUNTER — Encounter (HOSPITAL_COMMUNITY): Payer: Self-pay | Admitting: Orthopedic Surgery

## 2021-02-20 DIAGNOSIS — B9561 Methicillin susceptible Staphylococcus aureus infection as the cause of diseases classified elsewhere: Secondary | ICD-10-CM | POA: Diagnosis not present

## 2021-02-20 DIAGNOSIS — R7881 Bacteremia: Secondary | ICD-10-CM | POA: Diagnosis not present

## 2021-02-20 LAB — BASIC METABOLIC PANEL
Anion gap: 8 (ref 5–15)
BUN: 12 mg/dL (ref 6–20)
CO2: 22 mmol/L (ref 22–32)
Calcium: 7.7 mg/dL — ABNORMAL LOW (ref 8.9–10.3)
Chloride: 102 mmol/L (ref 98–111)
Creatinine, Ser: 0.5 mg/dL (ref 0.44–1.00)
GFR, Estimated: >60 mL/min (ref >60)
Glucose, Bld: 223 mg/dL — ABNORMAL HIGH (ref 70–99)
Potassium: 3.8 mmol/L (ref 3.5–5.1)
Sodium: 132 mmol/L — ABNORMAL LOW (ref 135–145)

## 2021-02-20 LAB — GLUCOSE, CAPILLARY
Glucose-Capillary: 213 mg/dL — ABNORMAL HIGH (ref 70–99)
Glucose-Capillary: 206 mg/dL — ABNORMAL HIGH (ref 70–99)
Glucose-Capillary: 163 mg/dL — ABNORMAL HIGH (ref 70–99)
Glucose-Capillary: 134 mg/dL — ABNORMAL HIGH (ref 70–99)

## 2021-02-20 LAB — CBC
HCT: 27.9 % — ABNORMAL LOW (ref 36.0–46.0)
Hemoglobin: 9.3 g/dL — ABNORMAL LOW (ref 12.0–15.0)
MCH: 28.5 pg (ref 26.0–34.0)
MCHC: 33.3 g/dL (ref 30.0–36.0)
MCV: 85.6 fL (ref 80.0–100.0)
Platelets: 199 10*3/uL (ref 150–400)
RBC: 3.26 MIL/uL — ABNORMAL LOW (ref 3.87–5.11)
RDW: 13.6 % (ref 11.5–15.5)
WBC: 9 10*3/uL (ref 4.0–10.5)
nRBC: 0 % (ref 0.0–0.2)

## 2021-02-20 LAB — BETA HCG QUANT (REF LAB): hCG Quant: <1 m[IU]/mL

## 2021-02-20 LAB — MAGNESIUM: Magnesium: 2.1 mg/dL (ref 1.7–2.4)

## 2021-02-20 IMAGING — MR MR LUMBAR SPINE WO/W CM
4 of 7 series · 23 of 48 positions shown · IV contrast (gadavist)
Comparison: None.

CLINICAL DATA: Low back pain, infection suspected

EXAM:
MRI LUMBAR SPINE WITHOUT AND WITH CONTRAST
TECHNIQUE: Multiplanar and multiecho pulse sequences of the lumbar spine were
obtained without and with intravenous contrast.
CONTRAST:  10mL GADAVIST GADOBUTROL 1 MMOL/ML IV SOLN

[Series 5: T2 · sagittal · 4.0mm · 0.73mm/px · 5 of 14 slices shown (1 of 2)]
[im 1/14]
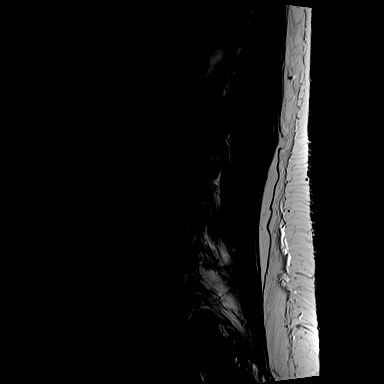
[im 4/14]
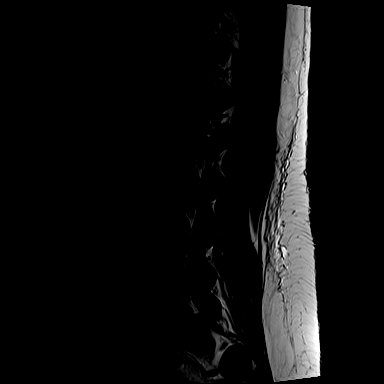
[im 7/14]
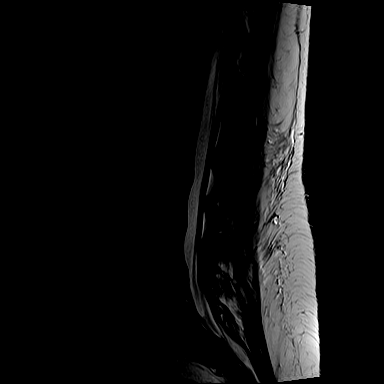
[im 10/14]
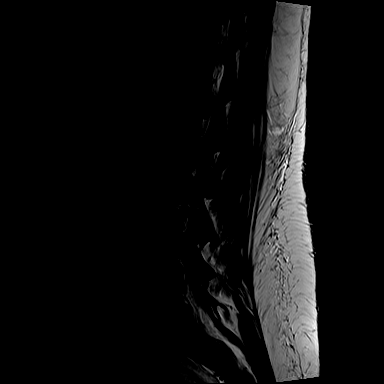
[im 14/14]
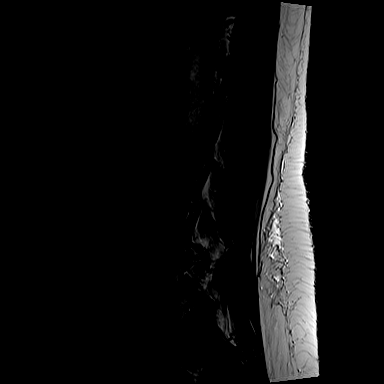

[Series 7: T1 · sagittal · 4.0mm · 0.88mm/px · 4 of 14 slices shown (1 of 2)]
[im 1/14]
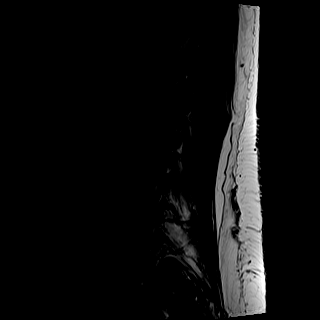
[im 5/14]
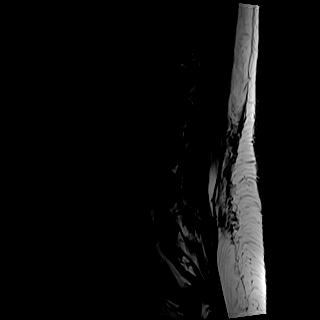
[im 9/14]
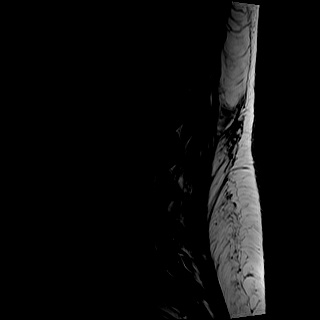
[im 14/14]
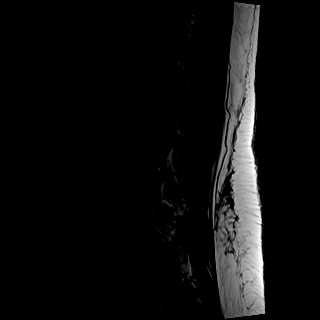

[Series 8: T2 · axial · 4.0mm · 0.57mm/px · z∈[-206,+3]mm · 8 of 32 slices shown (2 of 2)]
[im 1/32]
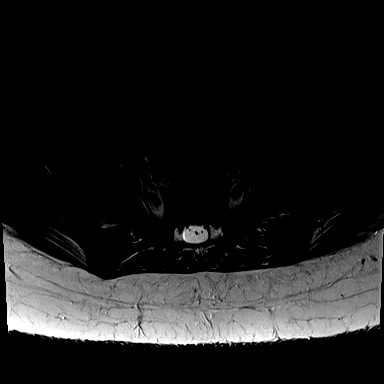
[im 4/32]
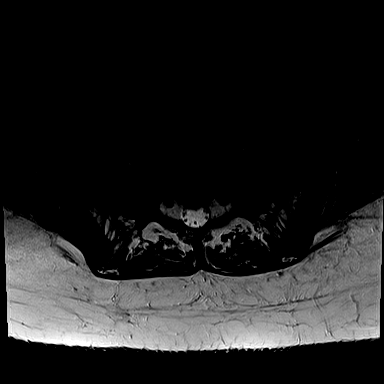
[im 11/32]
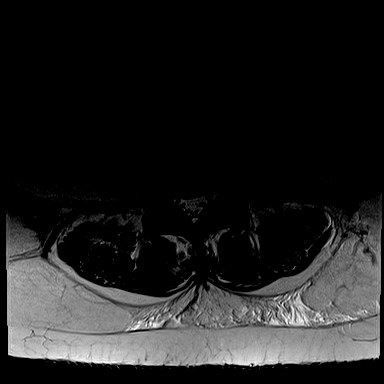
[im 14/32]
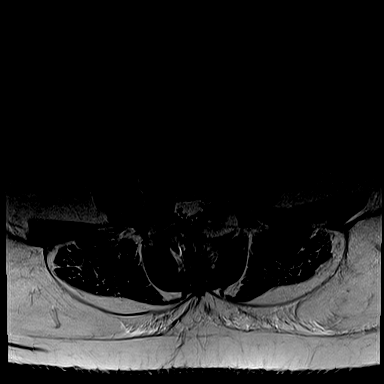
[im 18/32]
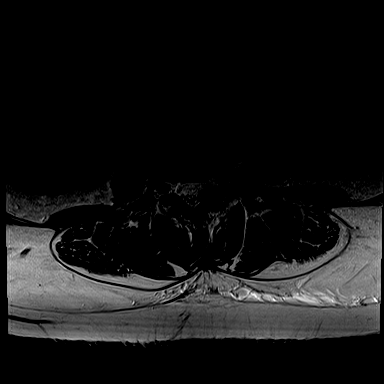
[im 21/32]
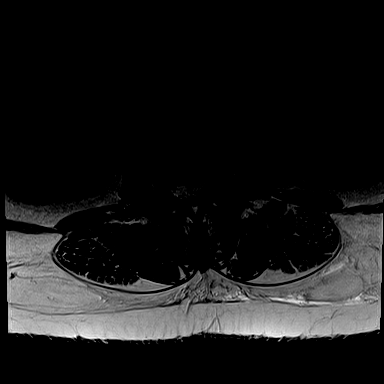
[im 28/32]
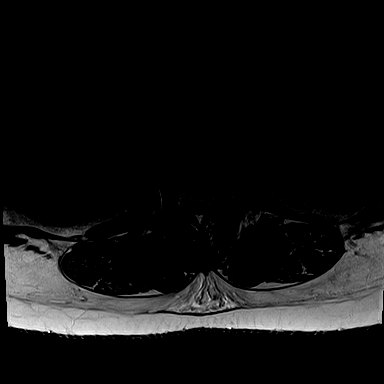
[im 32/32]
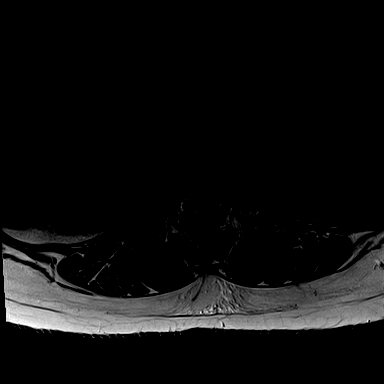

[Series 9: T1 · axial · 4.0mm · 0.34mm/px · z∈[-206,-17]mm · 6 of 32 slices shown (2 of 2)]
[im 1/32]
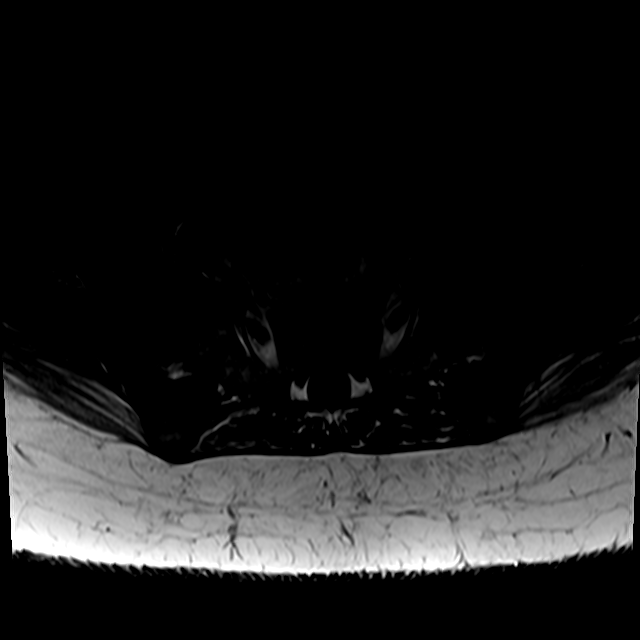
[im 4/32]
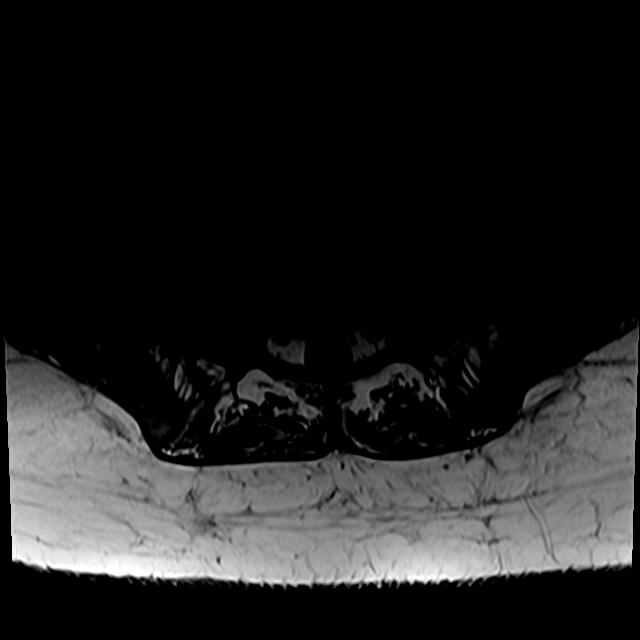
[im 11/32]
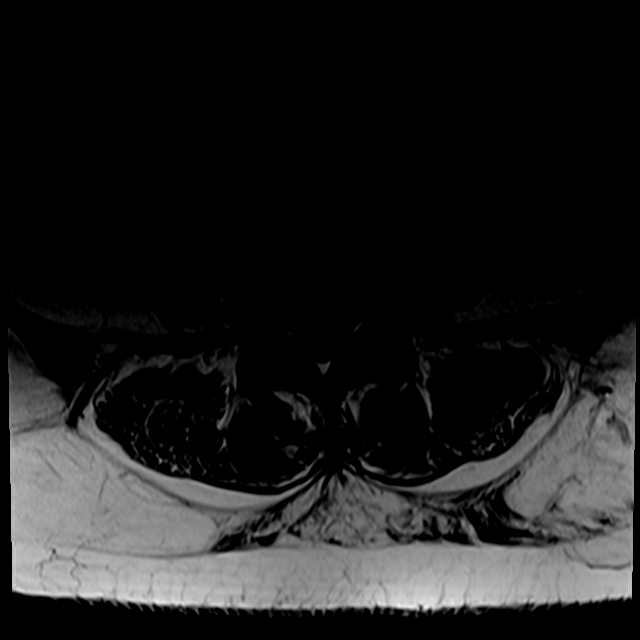
[im 14/32]
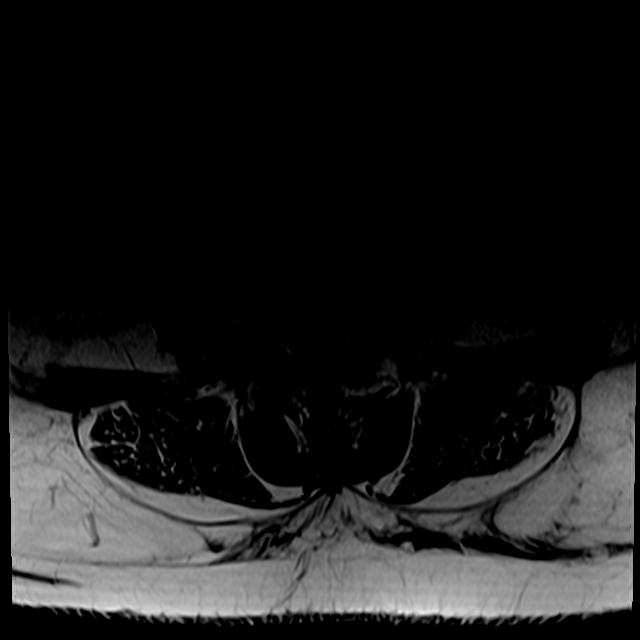
[im 18/32]
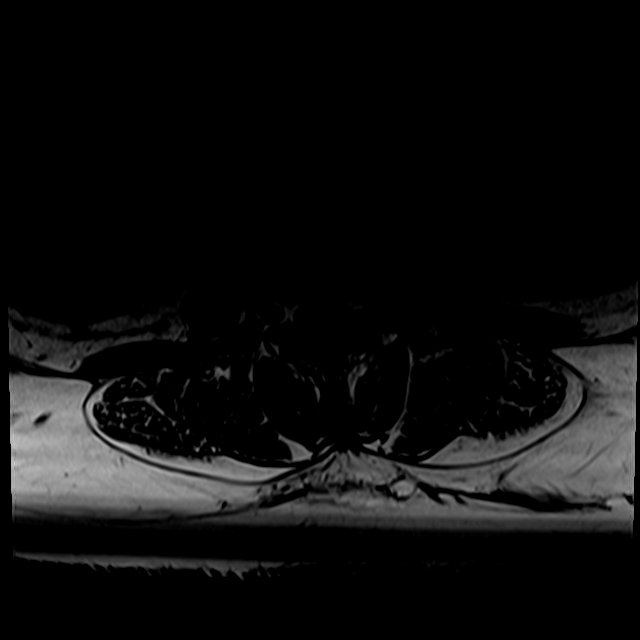
[im 28/32]
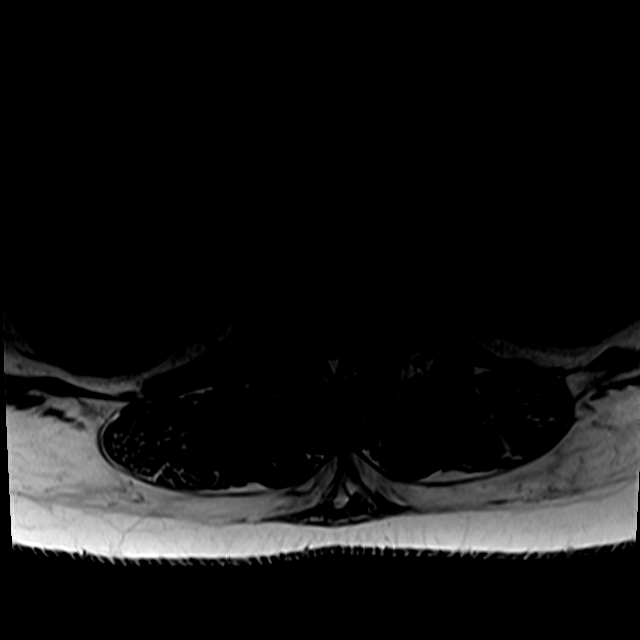

[23 of 48 positions shown; findings below may reference images not displayed]

FINDINGS: Segmentation:  Standard segmentation is assumed.

Alignment:  No substantial sagittal subluxation.

Vertebrae: Edema in the L4-L5 disc with adjacent inferior L4 and
superior L5 endplate edema and enhancement.

Conus medullaris and cauda equina: Conus extends to the L1 level.
Conus and cauda equina appear normal.

Paraspinal and other soft tissues: Unremarkable.

Disc levels:

T12-L1: Only imaged sagittally. No significant canal or foraminal
stenosis.

L1-L2: No significant disc protrusion, foraminal stenosis, or canal
stenosis.

L2-L3: Mild disc bulging without significant canal or foraminal
stenosis.

L3-L4: Mild left eccentric disc bulge and mild facet arthropathy
without significant canal stenosis. Mild left subarticular recess
stenosis. No significant foraminal stenosis.

L4-L5: Mild disc bulge inferiorly dissecting right subarticular disc
protrusion. Resulting mild right subarticular recess stenosis
without significant canal stenosis. Mild right foraminal stenosis.

L5-S1: Mild broad disc bulge without significant canal or foraminal
stenosis.
IMPRESSION: 1. Edema in the L4-L5 disc with adjacent inferior L4 and superior L5
endplate edema and enhancement. Findings are concerning for
discitis/osteomyelitis given the reported clinical suspicion for
infection. The degree of endplate enhancement is greater than
expected for degenerative/discogenic endplate signal changes. No
definite epidural involvement.
2. At L4-L5, inferiorly dissecting right subarticular disc
protrusion with mild right subarticular recess and foraminal
stenosis.

These results will be called to the ordering clinician or
representative by the Radiologist Assistant, and communication
documented in the PACS or [REDACTED].

## 2021-02-20 IMAGING — DX DG SHOULDER 2+V*L*
1 series · 2 of 2 positions shown · non-contrast
Comparison: None.

CLINICAL DATA: Sudden LEFT shoulder pain

EXAM:
LEFT SHOULDER - 2+ VIEW

[Series 1: shoulder · 0.14mm/px · 2 of 2 slices shown]
[im 1/2]
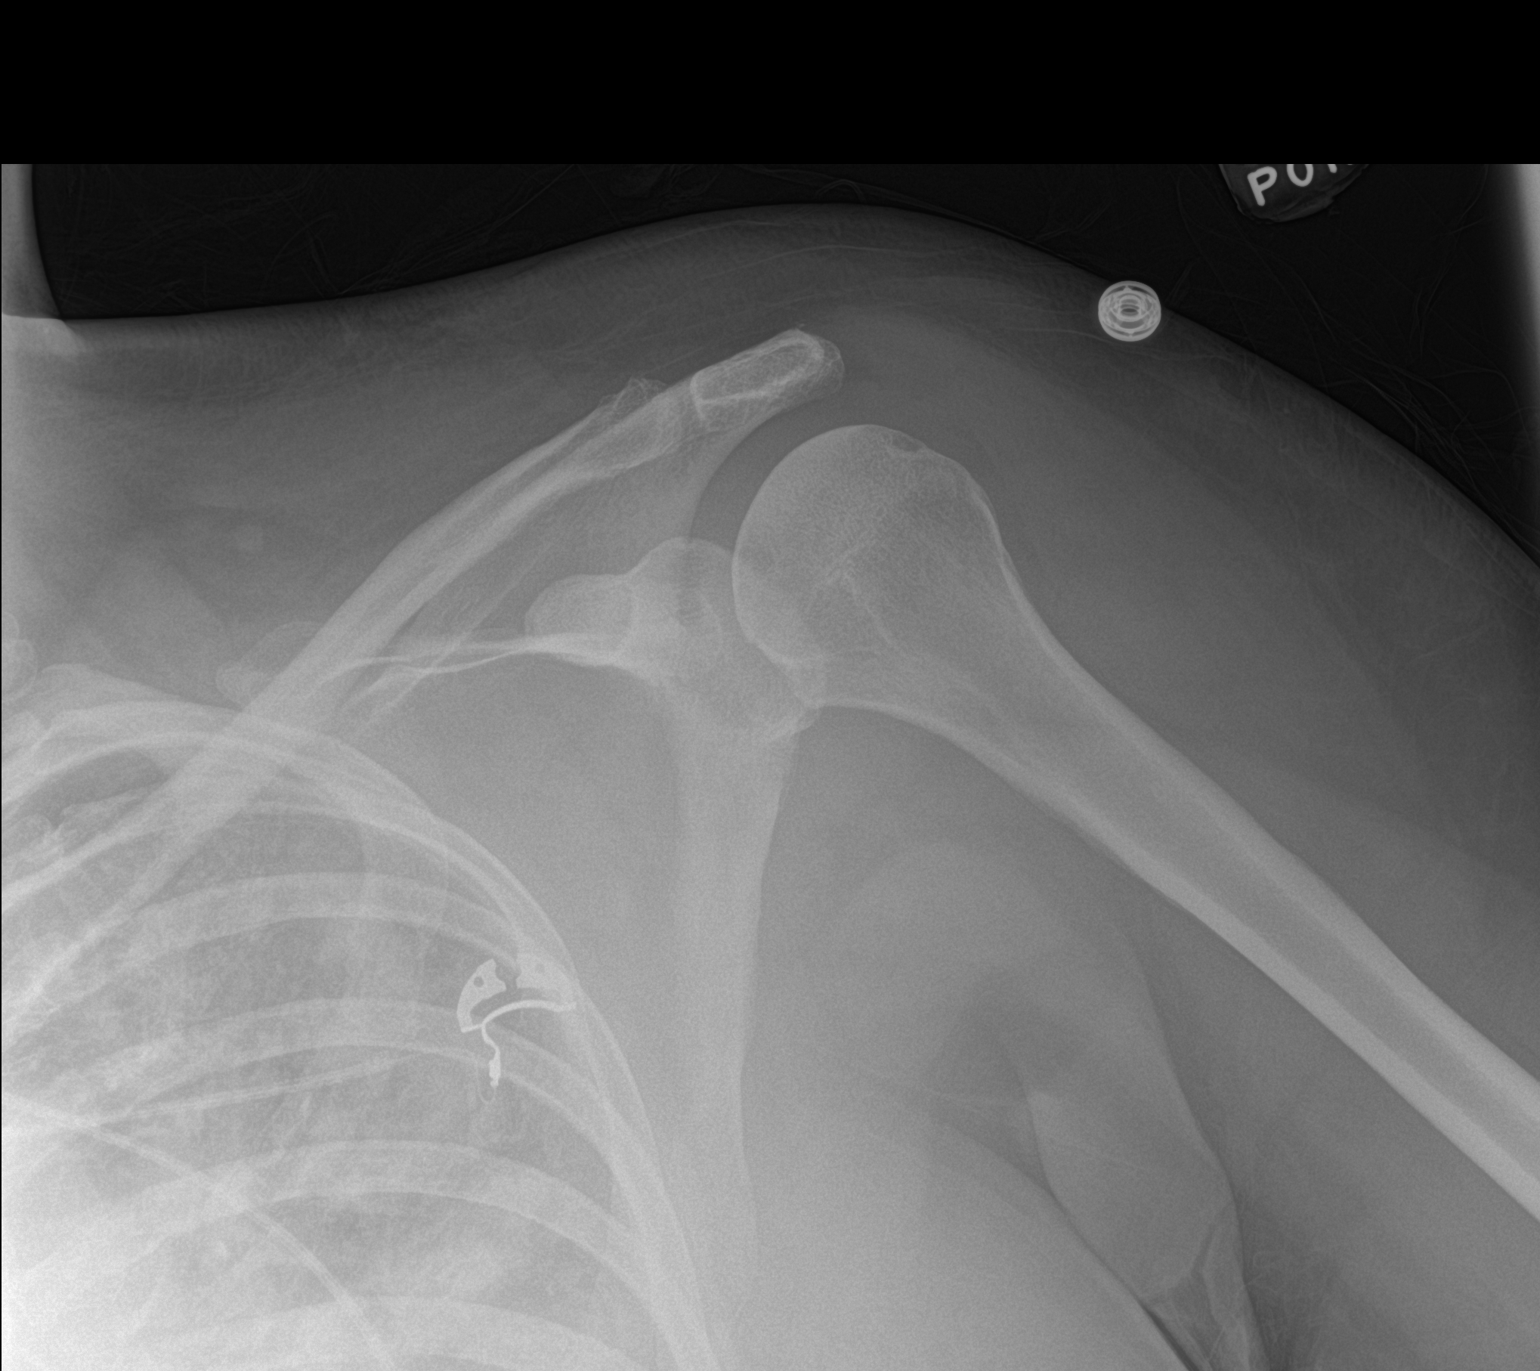
[im 2/2]
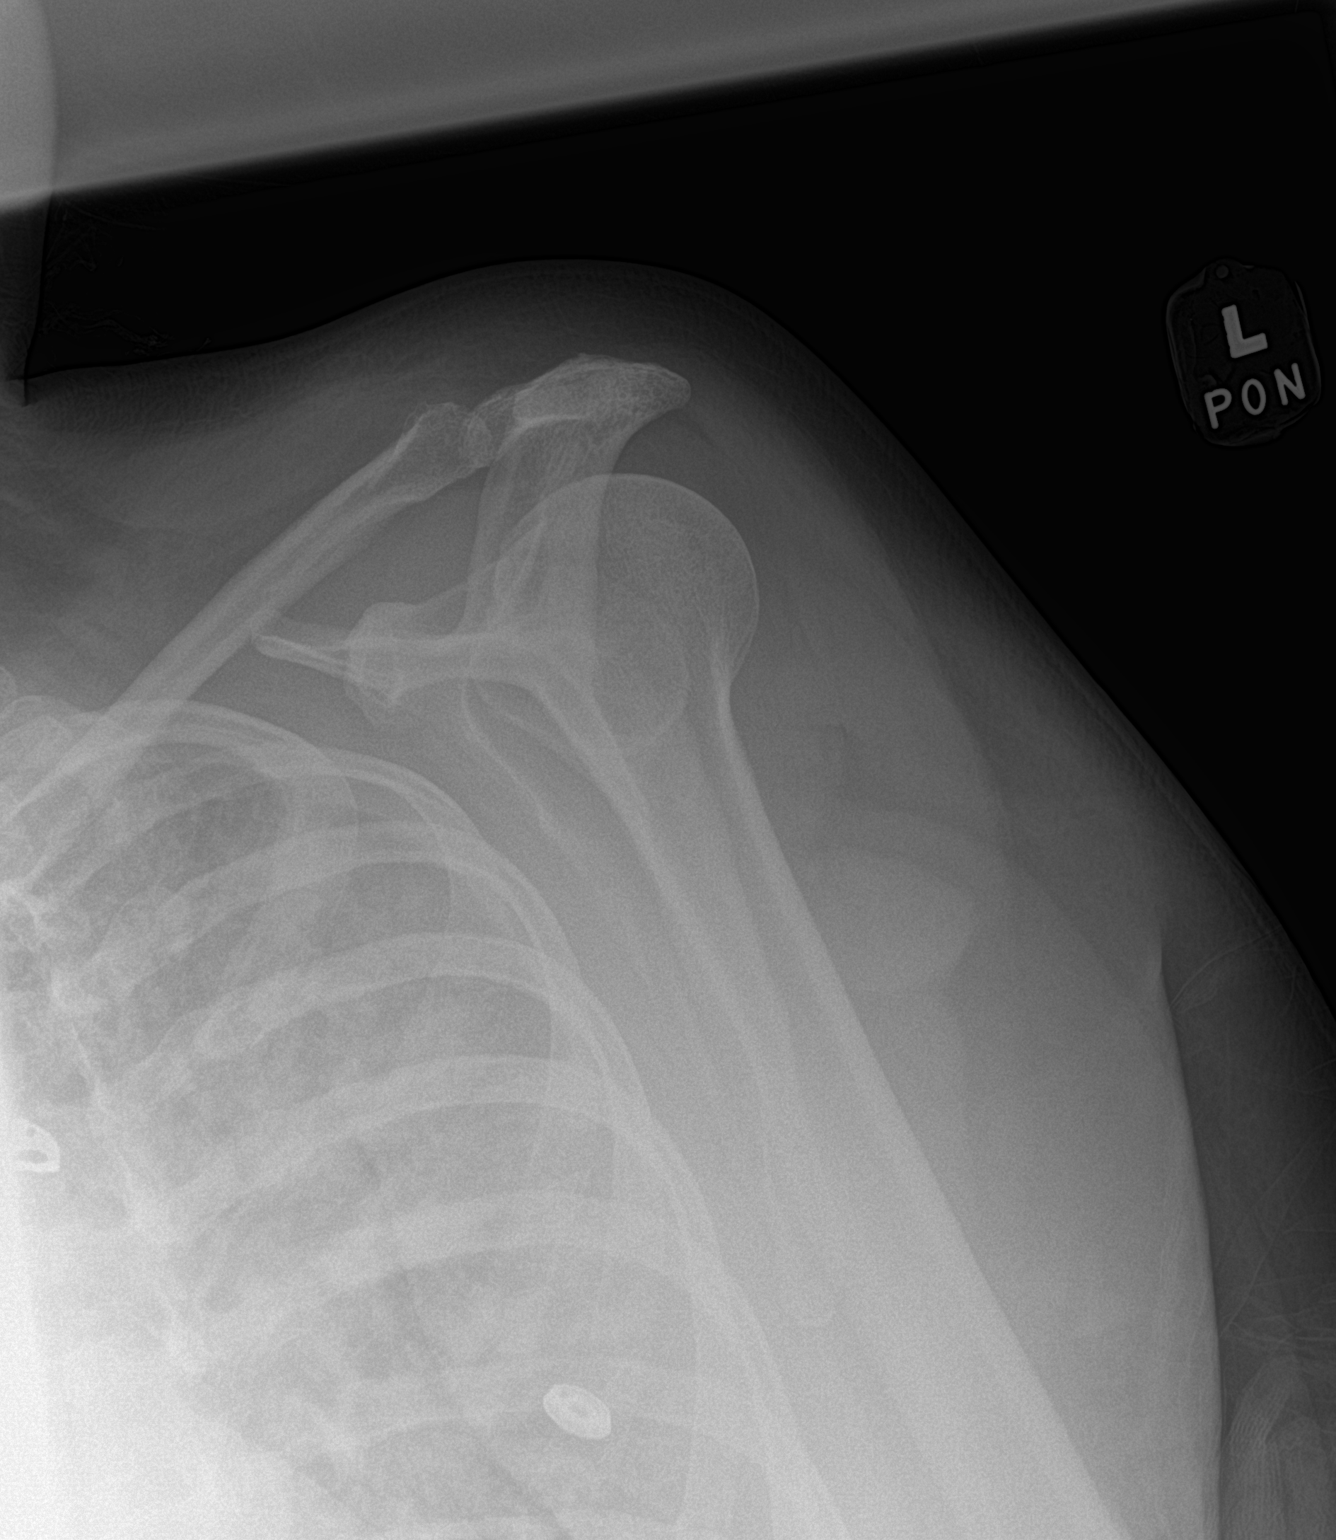

[2 of 2 positions shown; findings below may reference images not displayed]

FINDINGS: Glenohumeral joint is intact. No evidence of scapular fracture or
humeral fracture. The acromioclavicular joint is intact.
IMPRESSION: No fracture or dislocation.  No soft tissue abnormality

## 2021-02-20 MED ORDER — GADOBUTROL 1 MMOL/ML IV SOLN
10.0000 mL | Freq: Once | INTRAVENOUS | Status: AC | PRN
  Administered 2021-02-20: 10 mL via INTRAVENOUS

## 2021-02-20 MED ORDER — HYDROMORPHONE HCL 1 MG/ML IJ SOLN
1.0000 mg | Freq: Once | INTRAMUSCULAR | Status: AC
  Administered 2021-02-20: 1 mg via INTRAVENOUS
  Filled 2021-02-20: qty 1

## 2021-02-20 MED ORDER — INSULIN ASPART 100 UNIT/ML IJ SOLN
6.0000 [IU] | Freq: Three times a day (TID) | INTRAMUSCULAR | Status: DC
Start: 2021-02-20 — End: 2021-02-26
  Administered 2021-02-22 – 2021-02-26 (×7): 6 [IU] via SUBCUTANEOUS

## 2021-02-20 MED ORDER — METHOCARBAMOL 500 MG PO TABS
500.0000 mg | ORAL_TABLET | Freq: Four times a day (QID) | ORAL | Status: DC | PRN
  Administered 2021-02-20 – 2021-02-21 (×3): 500 mg via ORAL
  Filled 2021-02-20 (×3): qty 1

## 2021-02-20 MED ORDER — POTASSIUM CHLORIDE CRYS ER 20 MEQ PO TBCR
40.0000 meq | EXTENDED_RELEASE_TABLET | Freq: Two times a day (BID) | ORAL | Status: AC
  Administered 2021-02-20 – 2021-02-21 (×2): 40 meq via ORAL
  Filled 2021-02-20 (×2): qty 2

## 2021-02-20 NOTE — Progress Notes (Signed)
   02/20/21 1639  Assess: MEWS Score  Temp 100.1 F (37.8 C)  BP 130/73  Pulse Rate (!) 123  Resp 16  SpO2 90 %  O2 Device Nasal Cannula  O2 Flow Rate (L/min) 2 L/min  Assess: MEWS Score  MEWS Temp 0  MEWS Systolic 0  MEWS Pulse 2  MEWS RR 0  MEWS LOC 0  MEWS Score 2  MEWS Score Color Yellow  Assess: if the MEWS score is Yellow or Red  Were vital signs taken at a resting state? Yes  Focused Assessment Change from prior assessment (see assessment flowsheet)  Early Detection of Sepsis Score *See Row Information* Medium  MEWS guidelines implemented *See Row Information* Yes  Treat  MEWS Interventions Other (Comment) (patient resting comfortably)  Pain Scale 0-10  Pain Score Asleep  Take Vital Signs  Increase Vital Sign Frequency  Yellow: Q 2hr X 2 then Q 4hr X 2, if remains yellow, continue Q 4hrs  Escalate  MEWS: Escalate Yellow: discuss with charge nurse/RN and consider discussing with provider and RRT  Notify: Charge Nurse/RN  Date Charge Nurse/RN Notified 02/20/21  Time Charge Nurse/RN Notified 1652  Notify: Provider  Provider Name/Title Doran Stabler  Date Provider Notified 02/20/21  Time Provider Notified 1653  Notification Type Page  Notification Reason Change in status  Provider response No new orders  Date of Provider Response 02/20/21  Time of Provider Response 1654  Document  Patient Outcome Other (Comment) (no interventions needed)  Progress note created (see row info) Yes

## 2021-02-20 NOTE — Progress Notes (Signed)
Inpatient Rehab Admissions Coordinator:   CIR consult received. I await therapy evaluations and recommendations in order to make a determination on candidacy for CIR.   Megan Salon, MS, CCC-SLP Rehab Admissions Coordinator  [phone] (celll) [phone] (office)

## 2021-02-20 NOTE — Evaluation (Signed)
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 PMH includes DM, HTN, obesity.  Clinical Impression  Patient presents with decreased arousal, post surgical deficits left residual limb, impaired balance, pain, decreased activity tolerance and impaired mobility s/p above. Pt lives at home with her 31 y/o daughter and is independent working at [employer]. Today, pt requires Max-total A of 2 for bed mobility and Max-Min guard assist for sitting balance using UEs as support due to back pain. Limited ability to mobilize left residual limb. Due to poor arousal, pt with decreased attention, inconsistent effort, poor initiation/sequencing and ability to follow commands consistently. Limited mainly by pain and cognition. Education re: limb guard, there ex, positioning, phantom limb pain etc. Would benefit from post acute rehab to maximize independence and mobility prior to return home. Will need to see if pt can tolerate more activity once pain is controlled without impacting arousal and if not, then will need SNF. Will follow acutely.       Recommendations for follow up therapy are one component of a multi-disciplinary discharge planning process, led by the attending physician.  Recommendations may be updated based on patient status, additional functional criteria and insurance authorization.  Follow Up Recommendations Acute inpatient rehab (3hours/day)    Assistance Recommended at Discharge Frequent or constant Supervision/Assistance  Functional Status Assessment Patient has had a recent decline in their functional status and demonstrates the ability to make significant improvements in function in a reasonable and predictable amount of time.  Equipment  Recommendations  BSC/3in1;Wheelchair (measurements PT);Wheelchair cushion (measurements PT) (drop arm)    Recommendations for Other Services Rehab consult     Precautions / Restrictions Precautions Precautions: Fall Required Braces or Orthoses: Other Brace Other Brace: limb protector Restrictions Weight Bearing Restrictions: No      Mobility  Bed Mobility Overal bed mobility: Needs Assistance Bed Mobility: Rolling;Sidelying to Sit;Sit to Supine Rolling: +2 for physical assistance;Max assist Sidelying to sit: +2 for physical assistance;HOB elevated;Max assist   Sit to supine: +2 for physical assistance;HOB elevated;Total assist   General bed mobility comments: Poor initiation of movement, cues for sequencing/technique, assist with LEs, trunk and bottom. Leaning posteriorly with UE support as position of comfort due to back pain.    Transfers                   General transfer comment: Attempted laterally scooting along side bed but poor initiation/effort and total A to perform 2 mini scoots. Not safe to attempt standing.    Ambulation/Gait               General Gait Details: Unable  Stairs            Wheelchair Mobility    Modified Rankin (Stroke Patients Only)       Balance Overall balance assessment: Needs assistance Sitting-balance support: Bilateral upper extremity supported Sitting balance-Leahy Scale: Poor Sitting balance - Comments: Initially requires Max A progressing to moments of Min A-Min guard with UE support, favors posterior lean due to back pain. Postural control: Posterior lean                                   Pertinent Vitals/Pain Pain Assessment: Faces  Faces Pain Scale: Hurts worst Pain Location: back and left residual limb Pain Descriptors / Indicators: Sore;Operative site guarding;Moaning;Grimacing;Guarding Pain Intervention(s): Monitored during session;Premedicated before session;Limited activity within  patient's tolerance;Repositioned    Home Living Family/patient expects to be discharged to:: Private residence Living Arrangements: Children (77 y/o daughter) Available Help at Discharge: Family;Available PRN/intermittently Type of Home: House Home Access: Stairs to enter Entrance Stairs-Rails: Right;Left;Can reach both Entrance Stairs-Number of Steps: 5-6   Home Layout: One level Home Equipment: Agricultural consultant (2 wheels);Tub bench      Prior Function Prior Level of Function : Independent/Modified Independent             Mobility Comments: Works at [employer] ADLs Comments: Independent     Hand Dominance        Extremity/Trunk Assessment   Upper Extremity Assessment Upper Extremity Assessment: Generalized weakness    Lower Extremity Assessment Lower Extremity Assessment: Generalized weakness;Difficult to assess due to impaired cognition LLE Deficits / Details: Limited movement throughout, reports sensation intact. Minimally able to perform QS LLE: Unable to fully assess due to pain LLE Sensation: WNL LLE Coordination: decreased fine motor;decreased gross motor    Cervical / Trunk Assessment Cervical / Trunk Assessment: Other exceptions Cervical / Trunk Exceptions: back pain, worsened since admission per mother  Communication   Communication: No difficulties  Cognition Arousal/Alertness: Lethargic;Suspect due to medications Behavior During Therapy: Flat affect Overall Cognitive Status: Difficult to assess                                 General Comments: Requires repetition to follow 1 step commands, impaired initiation, sequencing, slow processing, poor problem solving, "I am trying to put my left foot on the floor" Pt going in and out with arousal, poor attention. Given pain meds half hour prior to session.        General Comments General comments (skin integrity, edema, etc.): Mother and son present during session.    Exercises Amputee  Exercises Quad Sets: AROM;Left;5 reps;Supine   Assessment/Plan    PT Assessment Patient needs continued PT services  PT Problem List Decreased strength;Decreased mobility;Obesity;Decreased range of motion;Decreased activity tolerance;Decreased cognition;Decreased skin integrity;Pain;Decreased balance;Decreased knowledge of use of DME       PT Treatment Interventions Therapeutic activities;Functional mobility training;Balance training;Wheelchair mobility training;Gait training;Therapeutic exercise;Patient/family education;DME instruction;Stair training;Modalities;Neuromuscular re-education    PT Goals (Current goals can be found in the Care Plan section)  Acute Rehab PT Goals Patient Stated Goal: less pain PT Goal Formulation: With patient Time For Goal Achievement: 03/06/21 Potential to Achieve Goals: Fair    Frequency Min 3X/week   Barriers to discharge Inaccessible home environment;Decreased caregiver support stairs to enter, 28 y/o daughter    Co-evaluation               AM-PAC PT "6 Clicks" Mobility  Outcome Measure Help needed turning from your back to your side while in a flat bed without using bedrails?: A Lot Help needed moving from lying on your back to sitting on the side of a flat bed without using bedrails?: Total Help needed moving to and from a bed to a chair (including a wheelchair)?: Total Help needed standing up from a chair using your arms (e.g., wheelchair or bedside chair)?: Total Help needed to walk in hospital room?: Total Help needed climbing 3-5 steps with a railing? : Total 6 Click Score: 7    End of Session  Activity Tolerance: Patient limited by pain;Patient limited by lethargy Patient left: in bed;with call bell/phone within reach;with bed alarm set;with family/visitor present;Other (comment) (DO present) Nurse Communication: Need for lift equipment;Mobility status PT Visit Diagnosis: Pain;Difficulty in walking, not elsewhere classified  (R26.2);Muscle weakness (generalized) (M62.81) Pain - Right/Left: Left Pain - part of body: Leg (back)    Time: 1433-1500 PT Time Calculation (min) (ACUTE ONLY): 27 min   Charges:   PT Evaluation $PT Eval Moderate Complexity: 1 Mod PT Treatments $Therapeutic Activity: 8-22 mins        Vale Haven, PT, DPT Acute Rehabilitation Services Pager [phone] Office [phone]     Blake Divine A Lanier Ensign 02/20/2021, 3:13 PM

## 2021-02-20 NOTE — Anesthesia Postprocedure Evaluation (Signed)
Anesthesia Post Note  Patient: Lori Schmidt  Procedure(s) Performed: AMPUTATION BELOW KNEE (Left: Knee)     Patient location during evaluation: PACU Anesthesia Type: Regional and MAC Level of consciousness: awake and alert Pain management: pain level controlled Vital Signs Assessment: post-procedure vital signs reviewed and stable Respiratory status: spontaneous breathing, nonlabored ventilation, respiratory function stable and patient connected to nasal cannula oxygen Cardiovascular status: stable and blood pressure returned to baseline Postop Assessment: no apparent nausea or vomiting Anesthetic complications: no   No notable events documented.  Last Vitals:  Vitals:   02/20/21 0428 02/20/21 0724  BP: 116/72 (!) 118/91  Pulse: (!) 108 (!) 104  Resp: 20 16  Temp: 36.5 C 36.9 C  SpO2: 91% 90%    Last Pain:  Vitals:   02/20/21 0724  TempSrc: Oral  PainSc:                  Shamia Uppal

## 2021-02-20 NOTE — Progress Notes (Signed)
HD#3 Subjective:  Overnight Events: no event  Patient seen and assessed with mom at bedside.  Complaining of back pain much worse.  States that coughing makes back pain worse and spasms.  Patient states that pain started during this hospitalization.  She denies any neurological changes of bilateral lower extremity.  Also endorses left shoulder pain as well since getting in hospital bed.  Patient's mom states that she had a fall and landed on her shoulder.  Patient states that pain is worse when pulling heavy object.  Leg pain somewhat controlled.    Objective:  Vital signs in last 24 hours: Vitals:   02/19/21 2003 02/20/21 0025 02/20/21 0428 02/20/21 0724  BP: 95/60 100/61 116/72 (!) 118/91  Pulse: 98 90 (!) 108 (!) 104  Resp: 19 17 20 16   Temp: 97.8 F (36.6 C) 98.2 F (36.8 C) 97.7 F (36.5 C) 98.5 F (36.9 C)  TempSrc: Oral Oral Oral Oral  SpO2: 93% 92% 91% 90%  Weight:      Height:       Supplemental O2: Room Air SpO2: 90 % O2 Flow Rate (L/min): 2 L/min   Physical Exam:  Physical Exam Constitutional:      General: She is in acute distress (In mild acute distress due to back pain.).  Eyes:     General:        Right eye: No discharge.        Left eye: No discharge.     Conjunctiva/sclera: Conjunctivae normal.  Cardiovascular:     Rate and Rhythm: Regular rhythm. Tachycardia present.  Pulmonary:     Effort: Pulmonary effort is normal. No respiratory distress.  Musculoskeletal:     Cervical back: Normal range of motion.     Comments: Left BKA, the wound is wrapped.  No obvious skin changes or erythema of the lumbar overlying skin.  No pain to palpation.  She does have significant pain when being rolled to one side. Left shoulder tender to palpation at the clavicle acromion joint.  No evidence of dislocation.  Still have good range of motion but limited due to pain.  No erythema or joint effusion seen on exam.   Skin:    General: Skin is warm.      Coloration: Skin is not jaundiced.  Neurological:     General: No focal deficit present.     Mental Status: She is alert.  Psychiatric:        Thought Content: Thought content normal.        Judgment: Judgment normal.    Filed Weights   02/18/21 0118  Weight: 121.2 kg     Intake/Output Summary (Last 24 hours) at 02/20/2021 0738 Last data filed at 02/20/2021 0428 Gross per 24 hour  Intake 1476.13 ml  Output 1700 ml  Net -223.87 ml   Net IO Since Admission: 1,708 mL [02/20/21 0738]  Pertinent Labs: CBC Latest Ref Rng & Units 02/20/2021 02/19/2021 02/18/2021  WBC 4.0 - 10.5 K/uL 9.0 10.3 6.7  Hemoglobin 12.0 - 15.0 g/dL 13/01/2021) 2.3(J) 10.3(L)  Hematocrit 36.0 - 46.0 % 27.9(L) 28.5(L) 29.7(L)  Platelets 150 - 400 K/uL 199 191 158    CMP Latest Ref Rng & Units 02/20/2021 02/19/2021 02/19/2021  Glucose 70 - 99 mg/dL 13/02/2021) 87 86  BUN 6 - 20 mg/dL 12 6 10   Creatinine 0.44 - 1.00 mg/dL 315(V ) 7.61  Sodium 135 - 145 mmol/L 132(L) 129(L) 132(L)  Potassium 3.5 - 5.1 mmol/L 3.8 3.5  3.4(L)  Chloride 98 - 111 mmol/L 102 98 98  CO2 22 - 32 mmol/L 22 22 22   Calcium 8.9 - 10.3 mg/dL 7.7(L) 8.2(L) 8.2(L)  Total Protein 6.5 - 8.1 g/dL - - -  Total Bilirubin 0.3 - 1.2 mg/dL - - -  Alkaline Phos 38 - 126 U/L - - -  AST 15 - 41 U/L - - -  ALT 0 - 44 U/L - - -    Imaging: No results found.  Assessment/Plan:   Active Problems:   Bacteremia due to Staphylococcus aureus   Diabetic osteomyelitis (HCC)   Left leg cellulitis   Subacute osteomyelitis, left ankle and foot ([facility])   Patient Summary: Lori Schmidt is a 45 y.o. with a pertinent PMH of HTN, poorly controlled T2DM, HLD, and depression who presented with LE erythema and swelling and admitted for MSSA bacteremia and osteomyelitis of left foot.  She underwent left BKA yesterday.  Now found to have osteomyelitis of her lumbar on MRI.  #MSSA bacteremia #Osteomyelitis of left leg #Osteomyelitis of L4-L5 Patient  underwent successful left BKA yesterday.  Now had severe back pain.  Lumbar MRI showed edema and enhancement of L4-L5 suspicious for infection.  Patient now will require at least 6 weeks of IV antibiotics for new finding of lumbar osteomyelitis. Patient also have left shoulder pain after trauma per patient's mom.  No fracture or dislocation seen on shoulder x-ray.  No evidence of septic joint on physical exam.  We will hold off on MRI of the shoulder for now She is on IV cefazolin for MSSA bacteremia she had a negative TTE.  Pending TEE, likely next week. - Appreciate orthopedics and ID recommendation - Continue ancef q8h, likely need 6 weeks course - Follow up repeat blood cultures - Oxycodone and Dilaudid as needed for pain now - PT /OT   #T2DM CBG still in the 200s today.  The infection can drive her blood glucose high.  We will optimize blood glucose for wound healing.   - Levemir 50u bid  - Increase Novolog 6u tid with meals + SSI - Continue CBG monitoring q4 - Monitor BMP   #Mildly elevated beta hCG  -Repeat beta-hCG now negative     Best Practice: Diet: Carb IVF: Fluids: NS, Rate: 75 cc/hr VTE: Lovenox Code: Full AB: Ancef Therapy Recs: Pending Family Contact: family, at bedside. DISPO: Anticipated discharge pending  surgery and clinical improvement .  Gaylan Gerold, DO 02/20/2021, 7:38 AM Pager: [phone]  Please contact the on call pager after 5 pm and on weekends at [phone].

## 2021-02-20 NOTE — Progress Notes (Signed)
Patient ID: Lori Schmidt, female   DOB: [birth year], 45 y.o.   MRN: [id number] Patient is seen postoperative day 1 status post left transtibial amputation.  There is no drainage in the wound VAC canister there is 1 check 1X.  Patient complains of lower back pain worse with coughing that she has had for a week.  Patient also complains of shoulder pain with possible history of blunt trauma.  Anticipate patient will need discharge either to inpatient rehab or skilled nursing.

## 2021-02-21 DIAGNOSIS — M4646 Discitis, unspecified, lumbar region: Secondary | ICD-10-CM

## 2021-02-21 DIAGNOSIS — M25512 Pain in left shoulder: Secondary | ICD-10-CM

## 2021-02-21 DIAGNOSIS — F32A Depression, unspecified: Secondary | ICD-10-CM

## 2021-02-21 DIAGNOSIS — R7881 Bacteremia: Secondary | ICD-10-CM | POA: Diagnosis not present

## 2021-02-21 DIAGNOSIS — L03116 Cellulitis of left lower limb: Secondary | ICD-10-CM | POA: Diagnosis not present

## 2021-02-21 DIAGNOSIS — E11628 Type 2 diabetes mellitus with other skin complications: Secondary | ICD-10-CM

## 2021-02-21 DIAGNOSIS — E785 Hyperlipidemia, unspecified: Secondary | ICD-10-CM

## 2021-02-21 DIAGNOSIS — B9561 Methicillin susceptible Staphylococcus aureus infection as the cause of diseases classified elsewhere: Secondary | ICD-10-CM | POA: Diagnosis not present

## 2021-02-21 DIAGNOSIS — I10 Essential (primary) hypertension: Secondary | ICD-10-CM

## 2021-02-21 LAB — BASIC METABOLIC PANEL
Anion gap: 8 (ref 5–15)
BUN: 13 mg/dL (ref 6–20)
CO2: 24 mmol/L (ref 22–32)
Calcium: 7.7 mg/dL — ABNORMAL LOW (ref 8.9–10.3)
Chloride: 103 mmol/L (ref 98–111)
Creatinine, Ser: 0.43 mg/dL — ABNORMAL LOW (ref 0.44–1.00)
GFR, Estimated: >60 mL/min (ref >60)
Glucose, Bld: 137 mg/dL — ABNORMAL HIGH (ref 70–99)
Potassium: 4 mmol/L (ref 3.5–5.1)
Sodium: 135 mmol/L (ref 135–145)

## 2021-02-21 LAB — GLUCOSE, CAPILLARY
Glucose-Capillary: 125 mg/dL — ABNORMAL HIGH (ref 70–99)
Glucose-Capillary: 124 mg/dL — ABNORMAL HIGH (ref 70–99)
Glucose-Capillary: 94 mg/dL (ref 70–99)
Glucose-Capillary: 111 mg/dL — ABNORMAL HIGH (ref 70–99)

## 2021-02-21 LAB — CBC
HCT: 29.4 % — ABNORMAL LOW (ref 36.0–46.0)
Hemoglobin: 9.5 g/dL — ABNORMAL LOW (ref 12.0–15.0)
MCH: 27.9 pg (ref 26.0–34.0)
MCHC: 32.3 g/dL (ref 30.0–36.0)
MCV: 86.5 fL (ref 80.0–100.0)
Platelets: 264 10*3/uL (ref 150–400)
RBC: 3.4 MIL/uL — ABNORMAL LOW (ref 3.87–5.11)
RDW: 13.9 % (ref 11.5–15.5)
WBC: 11 10*3/uL — ABNORMAL HIGH (ref 4.0–10.5)
nRBC: 0 % (ref 0.0–0.2)

## 2021-02-21 MED ORDER — PRAVASTATIN SODIUM 10 MG PO TABS
20.0000 mg | ORAL_TABLET | Freq: Every day | ORAL | Status: DC
  Administered 2021-02-21: 20 mg via ORAL
  Filled 2021-02-21 (×2): qty 2

## 2021-02-21 NOTE — Progress Notes (Signed)
Subjective:  Lori Schmidt is a 45 y.o. female with a history of uncontrolled DM, HTN, HLD, and depression who presented with [name] and was found to have extensive osteomyelitis.   Hospital day: 3  Overnight event: No acute overnight events.   Patient seen at bedside during AM rounds and is accompanied by her niece, Lori Schmidt. She is day 3 s/p L transtibial amputation. She notes that her back pain prevents her from moving around, but states that she is not feeling any back or leg pain at this time. She has had decreased po intake since yesterday.   Objective:  Vital signs in last 24 hours: Vitals:   02/20/21 2141 02/21/21 0101 02/21/21 0557 02/21/21 0600  BP: 133/81 95/64 105/72   Pulse:  (!) 110 (!) 108 (!) 103  Resp: 20 19 20    Temp: 99.4 F (37.4 C) 98.7 F (37.1 C) 99.9 F (37.7 C)   TempSrc: Oral Oral Oral   SpO2: 94% (!) 84% (!) 86% 91%  Weight:      Height:        Filed Weights   02/18/21 0118  Weight: 121.2 kg     Intake/Output Summary (Last 24 hours) at 02/21/2021 [id number] Last data filed at 02/21/2021 0600 Gross per 24 hour  Intake 2133.39 ml  Output 1150 ml  Net 983.39 ml   Net IO Since Admission: 2,691.39 mL [02/21/21 0712]  Recent Labs    02/20/21 2146 02/21/21 0744 02/21/21 1201  GLUCAP 134* 125* 124*     Pertinent Labs: CBC Latest Ref Rng & Units 02/21/2021 02/20/2021 02/19/2021  WBC 4.0 - 10.5 K/uL 11.0(H) 9.0 10.3  Hemoglobin 12.0 - 15.0 g/dL 9.5(L) 9.3(L) 9.8(L)  Hematocrit 36.0 - 46.0 % 29.4(L) 27.9(L) 28.5(L)  Platelets 150 - 400 K/uL 264 199 191    BMP Latest Ref Rng & Units 02/21/2021 02/20/2021 02/19/2021  Glucose 70 - 99 mg/dL 137(H) 223(H) 87  BUN 6 - 20 mg/dL 13 12 6   Creatinine 0.44 - 1.00 mg/dL 0.43(L) 0.50 0.43(L)  Sodium 135 - 145 mmol/L 135 132(L) 129(L)  Potassium 3.5 - 5.1 mmol/L 4.0 3.8 3.5  Chloride 98 - 111 mmol/L 103 102 98  CO2 22 - 32 mmol/L 24 22 22   Calcium 8.9 - 10.3 mg/dL 7.7(L) 7.7(L) 8.2(L)    Micro Results:  Culture, blood (routine x 2)     Status: None (Preliminary result)   Collection Time: 02/19/21  1:18 AM   Specimen: BLOOD  Result Value Ref Range Status   Specimen Description BLOOD RIGHT ANTECUBITAL  Final   Special Requests   Final    BOTTLES DRAWN AEROBIC AND ANAEROBIC Blood Culture adequate volume   Culture   Final    NO GROWTH 2 DAYS Performed at [facility], [place] [address] [place], [place] [address]    Report Status PENDING  Incomplete  Culture, blood (routine x 2)     Status: None (Preliminary result)   Collection Time: 02/19/21  1:28 AM   Specimen: BLOOD RIGHT HAND  Result Value Ref Range Status   Specimen Description BLOOD RIGHT HAND  Final   Special Requests   Final    BOTTLES DRAWN AEROBIC AND ANAEROBIC Blood Culture adequate volume   Culture   Final    NO GROWTH 2 DAYS Performed at [facility], [place] [address] [place], [place] [address]    Report Status PENDING  Incomplete      Imaging: MR Lumbar Spine W  Wo Contrast  Result Date: 02/20/2021 CLINICAL DATA:  Low back pain, infection suspected EXAM: MRI LUMBAR SPINE WITHOUT AND WITH CONTRAST TECHNIQUE: Multiplanar and multiecho pulse sequences of the lumbar spine were obtained without and with intravenous contrast. CONTRAST:  43mL GADAVIST GADOBUTROL 1 MMOL/ML IV SOLN COMPARISON:  None. FINDINGS: Segmentation:  Standard segmentation is assumed. Alignment:  No substantial sagittal subluxation. Vertebrae: Edema in the L4-L5 disc with adjacent inferior L4 and superior L5 endplate edema and enhancement. Conus medullaris and cauda equina: Conus extends to the L1 level. Conus and cauda equina appear normal. Paraspinal and other soft tissues: Unremarkable. Disc levels: T12-L1: Only imaged sagittally. No significant canal or foraminal stenosis. L1-L2: No significant disc protrusion, foraminal stenosis, or canal stenosis. L2-L3: Mild disc bulging without significant canal or foraminal stenosis.  L3-L4: Mild left eccentric disc bulge and mild facet arthropathy without significant canal stenosis. Mild left subarticular recess stenosis. No significant foraminal stenosis. L4-L5: Mild disc bulge inferiorly dissecting right subarticular disc protrusion. Resulting mild right subarticular recess stenosis without significant canal stenosis. Mild right foraminal stenosis. L5-S1: Mild broad disc bulge without significant canal or foraminal stenosis. IMPRESSION: 1. Edema in the L4-L5 disc with adjacent inferior L4 and superior L5 endplate edema and enhancement. Findings are concerning for discitis/osteomyelitis given the reported clinical suspicion for infection. The degree of endplate enhancement is greater than expected for degenerative/discogenic endplate signal changes. No definite epidural involvement. 2. At L4-L5, inferiorly dissecting right subarticular disc protrusion with mild right subarticular recess and foraminal stenosis. These results will be called to the ordering clinician or representative by the Radiologist Assistant, and communication documented in the PACS or [employer]. Electronically Signed   By: Feliberto Harts M.D.   On: 02/20/2021 11:39   DG Shoulder Left  Result Date: 02/20/2021 CLINICAL DATA:  Sudden LEFT shoulder pain EXAM: LEFT SHOULDER - 2+ VIEW COMPARISON:  None. FINDINGS: Glenohumeral joint is intact. No evidence of scapular fracture or humeral fracture. The acromioclavicular joint is intact. IMPRESSION: No fracture or dislocation.  No soft tissue abnormality Electronically Signed   By: Genevive Bi M.D.   On: 02/20/2021 09:23    Physical Exam  General: Alert, laying in bed, NAD CV: RRR, no murmurs heard Pulmonary: Lungs CTAB, normal WOB on room air Abdominal: Soft, non-tender and non-distended Extremities: LLE BKA, surgical site tightly wrapped with gauze, no drainage seen. Prosthetic shrinker and limb protector present Skin: Warm, dry  Neuro: A&O x3, no focal  deficits  Psych: Appropriate mood and affect  Assessment/Plan: Lori Schmidt is a 45 y.o. female with hx of uncontrolled DM, HTN, HLD, and depression presenting with LLE osteomyelitis.  Active Problems:   Bacteremia due to Staphylococcus aureus   Diabetic osteomyelitis (HCC)   Left leg cellulitis   Subacute osteomyelitis, left ankle and foot (HCC)  #MSSA bacteremia #Osteomyelitis #Discitis #s/p L transtibial amputation  Patient is post-op day 3 following L transtibial amputation 2/2 osteomyelitis. Her pain appears to be well controlled with oxycodone and Dilaudid PRN. She continues to have mildly elevated temps, and WBC has increased today from 9 to 11. ID is following and recommended TEE. Will continue 6-week course of IV Ancef for discitis. Will need PICC line placement but will defer discussion until her risk of line infection has decreased. Blood cultures with no growth for 2 days. Encouraged patient to ambulate to chair and continue to work with PT.  -ID consulted, appreciate recs  -Continue IV Ancef  -f/u TEE  -f/u BC  -Tylenol  PRN mild pain, Oxycodone PRN mod pain, Dilaudid PRN severe pain  -PT following, appreciate assistance   #DM  Appears better controlled after increasing Novolog to 6U TID with meals. Recent CBGs ranging from 124-167, although this could also be due to decreased po intake. Patient and nurse aware to hold meal-time insulin when she does not eat much. -Novolog 6U TID with meals -Levemir 50U BID  -SSI and routine CBG monitoring   #Depression  -Continue Celexa 40mg  po daily   #HTN  #HLD  Losartan and pravastatin initially held due to slightly elevated beta-hCG and concern for teratogenic agents in pregnancy. Repeat beta-hCG was normal. BP has been normotensive, will continue to hold Losartan at this time.  -Restart pravastatin 20mg  po daily   Diet: Carb modified  IVF: IV NS @ 68mL/hr  VTE: Lovenox CODE: Full   Prior to Admission Living  Arrangement: Home  Anticipated Discharge Location: CIR vs SNF - per PT rec Barriers to Discharge: Medical stability  Dispo: Admit to inpatient with anticipated stay >2 midnights   Signed: Stefani Dama, Medical Student 02/21/2021, 7:12 AM  Pager: [phone] Internal Medicine Teaching Service After 5pm on weekdays and 1pm on weekends: On Call pager: [phone]

## 2021-02-21 NOTE — Evaluation (Signed)
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 PMH includes DM, HTN, obesity.   Clinical Impression   Pt typically independent in ADL and mobility. Works for [employer]. Today Pt is improved in cognition/lethargy from previous PT session but some deficits in initiation, safety awareness, problem solving still remain impacting ability to perform ADL and maintain balance. Pain in a factor today - but not limiting. Pt initially on bed pan upon entry, max A +2 for bed mobility to roll and perform peri care at bed level. Then Pt sat EOb with max A +2 posterior lean still present - able to progress from max A for balance initially to min A. At this time recommending CIR post-acute as Pt will benefit and can participate in 3 hour intensive therapy and requires a comprehensive therapy team to maximize safety and independence in ADL and functional transfers.  Next session to plan on OOB, work on sitting balance for participation in ADL.     Recommendations for follow up therapy are one component of a multi-disciplinary discharge planning process, led by the attending physician.  Recommendations may be updated based on patient status, additional functional criteria and insurance authorization.   Follow Up Recommendations  Acute inpatient rehab (3hours/day)    Assistance Recommended at Discharge Frequent or constant Supervision/Assistance  Functional Status Assessment  Patient has had a recent decline in their functional status and demonstrates the ability to make significant improvements in function in a reasonable and predictable amount of time.  Equipment Recommendations  BSC/3in1;Wheelchair (measurements OT);Wheelchair cushion (measurements OT)     Recommendations for Other Services Rehab consult     Precautions / Restrictions Precautions Precautions: Fall Required Braces or Orthoses: Other Brace Other Brace: limb protector      Mobility Bed Mobility Overal bed mobility: Needs Assistance Bed Mobility: Rolling;Sidelying to Sit;Sit to Supine Rolling: +2 for physical assistance;Max assist Sidelying to sit: +2 for physical assistance;HOB elevated;Max assist   Sit to supine: +2 for physical assistance;HOB elevated;Total assist   General bed mobility comments: Poor initiation of movement, cues for sequencing/technique, assist with LEs, trunk and bottom. Leaning posteriorly with UE support as position of comfort due to back pain.    Transfers                   General transfer comment: not safe to attempt today      Balance Overall balance assessment: Needs assistance Sitting-balance support: Bilateral upper extremity supported Sitting balance-Leahy Scale: Poor Sitting balance - Comments: requires at least min A for EOB balance. initially max A. Postural control: Posterior lean                                 ADL either performed or assessed with clinical judgement   ADL Overall ADL's : Needs assistance/impaired Eating/Feeding: Set up;Bed level (HOb elevated)   Grooming: Set up;Wash/dry face;Wash/dry hands;Bed level (HOB elevated) Grooming Details (indicate cue type and reason): set up at bed level, requiring asist for seated balance at EOB Upper Body Bathing: Minimal assistance;Bed level   Lower Body Bathing: Maximal assistance;Bed level   Upper Body Dressing : Moderate assistance   Lower Body Dressing: Total assistance Lower Body Dressing Details (indicate cue type and  reason): reposition limb guard Toilet Transfer: Maximal assistance;+2 for physical assistance;+2 for safety/equipment (bed level)   Toileting- Clothing Manipulation and Hygiene: Maximal assistance;+2 for physical  assistance;+2 for safety/equipment;Bed level Toileting - Clothing Manipulation Details (indicate cue type and reason): rolling for peri care             Vision Patient Visual Report: No change from baseline       Perception     Praxis      Pertinent Vitals/Pain Pain Assessment: Faces Faces Pain Scale: Hurts whole lot Breathing: normal Negative Vocalization: none Facial Expression: smiling or inexpressive Body Language: relaxed Consolability: no need to console PAINAD Score: 0 Pain Location: back and left residual limb Pain Descriptors / Indicators: Sore;Operative site guarding;Moaning;Grimacing;Guarding Pain Intervention(s): Monitored during session;Repositioned;Limited activity within patient's tolerance;RN gave pain meds during session     Hand Dominance     Extremity/Trunk Assessment Upper Extremity Assessment Upper Extremity Assessment: Generalized weakness   Lower Extremity Assessment Lower Extremity Assessment: Defer to PT evaluation LLE Deficits / Details: Limited movement throughout, reports sensation intact. LLE: Unable to fully assess due to pain LLE Sensation: WNL LLE Coordination: decreased fine motor;decreased gross motor   Cervical / Trunk Assessment Cervical / Trunk Assessment: Other exceptions Cervical / Trunk Exceptions: back pain   Communication Communication Communication: No difficulties   Cognition Arousal/Alertness: Awake/alert Behavior During Therapy: Flat affect Overall Cognitive Status: Impaired/Different from baseline Area of Impairment: Following commands;Safety/judgement;Problem solving                       Following Commands: Follows one step commands consistently Safety/Judgement: Decreased awareness of safety;Decreased awareness of deficits   Problem Solving: Decreased initiation;Requires verbal cues;Requires tactile cues General Comments: improved arousal this session, however continues to require multimodal cues  for task completion and problem solving movement     General Comments       Exercises     Shoulder Instructions      Home Living Family/patient expects to be discharged to:: Private residence Living Arrangements: Children (52 y/o daughter) Available Help at Discharge: Family;Available PRN/intermittently Type of Home: House Home Access: Stairs to enter [employer] of Steps: 5-6 Entrance Stairs-Rails: Right;Left;Can reach both Home Layout: One level     Bathroom Shower/Tub: Producer, television/film/video: Standard     Home Equipment: Agricultural consultant (2 wheels);Tub bench          Prior Functioning/Environment Prior Level of Function : Independent/Modified Independent             Mobility Comments: Works at [employer] ADLs Comments: Independent        OT Problem List: Decreased strength;Impaired balance (sitting and/or standing);Decreased activity tolerance;Decreased range of motion;Decreased cognition;Decreased safety awareness;Obesity;Pain      OT Treatment/Interventions: Self-care/ADL training;Energy conservation;DME and/or AE instruction;Patient/family education;Balance training;Therapeutic activities    OT Goals(Current goals can be found in the care plan section) Acute Rehab OT Goals Patient Stated Goal: Get back to work at [employer] OT Goal Formulation: With patient Time For Goal Achievement: 03/07/21 Potential to Achieve Goals: Good ADL Goals Pt Will Perform Grooming: with supervision;sitting Pt Will Perform Upper Body Dressing: with set-up;sitting Pt Will Perform Lower Body Dressing: with min assist;with caregiver independent in assisting;sitting/lateral leans Pt Will Transfer to Toilet: with mod assist;squat pivot transfer;bedside commode Pt Will Perform Toileting - Clothing Manipulation and hygiene: with min guard assist;sitting/lateral leans Additional ADL Goal #1: Pt will maintain seated balance EOB for participation in ADL at  supervision  level  OT Frequency: Min 2X/week   Barriers to D/C:            Co-evaluation              AM-PAC OT "6 Clicks" Daily Activity     Outcome Measure Help from another person eating meals?: A Little Help from another person taking care of personal grooming?: A Little Help from another person toileting, which includes using toliet, bedpan, or urinal?: A Lot Help from another person bathing (including washing, rinsing, drying)?: A Lot Help from another person to put on and taking off regular upper body clothing?: A Lot Help from another person to put on and taking off regular lower body clothing?: A Lot 6 Click Score: 14   End of Session Nurse Communication: Mobility status  Activity Tolerance: Patient tolerated treatment well Patient left: in bed;with call bell/phone within reach;with family/visitor present;with bed alarm set  OT Visit Diagnosis: Other abnormalities of gait and mobility (R26.89);Other symptoms and signs involving cognitive function;Pain Pain - Right/Left: Left Pain - part of body: Leg                Time: 1715-1743 OT Time Calculation (min): 28 min Charges:  OT General Charges $OT Visit: 1 Visit OT Evaluation $OT Eval Moderate Complexity: 1 Mod OT Treatments $Self Care/Home Management : 8-22 mins  Nyoka Cowden OTR/L Acute Rehabilitation Services Pager: [phone] Office: [phone]  Evern Bio Somaly Marteney 02/21/2021, 6:01 PM

## 2021-02-22 DIAGNOSIS — R7881 Bacteremia: Secondary | ICD-10-CM | POA: Diagnosis not present

## 2021-02-22 DIAGNOSIS — E1169 Type 2 diabetes mellitus with other specified complication: Secondary | ICD-10-CM | POA: Diagnosis not present

## 2021-02-22 DIAGNOSIS — B9561 Methicillin susceptible Staphylococcus aureus infection as the cause of diseases classified elsewhere: Secondary | ICD-10-CM | POA: Diagnosis not present

## 2021-02-22 LAB — GLUCOSE, CAPILLARY
Glucose-Capillary: 96 mg/dL (ref 70–99)
Glucose-Capillary: 146 mg/dL — ABNORMAL HIGH (ref 70–99)
Glucose-Capillary: 78 mg/dL (ref 70–99)
Glucose-Capillary: 101 mg/dL — ABNORMAL HIGH (ref 70–99)

## 2021-02-22 LAB — CBC
HCT: 29.2 % — ABNORMAL LOW (ref 36.0–46.0)
Hemoglobin: 9.5 g/dL — ABNORMAL LOW (ref 12.0–15.0)
MCH: 28.2 pg (ref 26.0–34.0)
MCHC: 32.5 g/dL (ref 30.0–36.0)
MCV: 86.6 fL (ref 80.0–100.0)
Platelets: 284 10*3/uL (ref 150–400)
RBC: 3.37 MIL/uL — ABNORMAL LOW (ref 3.87–5.11)
RDW: 13.9 % (ref 11.5–15.5)
WBC: 9.7 10*3/uL (ref 4.0–10.5)
nRBC: 0.2 % (ref 0.0–0.2)

## 2021-02-22 LAB — C-REACTIVE PROTEIN: CRP: 19.7 mg/dL — ABNORMAL HIGH (ref <1.0)

## 2021-02-22 LAB — BASIC METABOLIC PANEL
Anion gap: 10 (ref 5–15)
BUN: 12 mg/dL (ref 6–20)
CO2: 22 mmol/L (ref 22–32)
Calcium: 7.3 mg/dL — ABNORMAL LOW (ref 8.9–10.3)
Chloride: 105 mmol/L (ref 98–111)
Creatinine, Ser: 0.35 mg/dL — ABNORMAL LOW (ref 0.44–1.00)
GFR, Estimated: >60 mL/min (ref >60)
Glucose, Bld: 85 mg/dL (ref 70–99)
Potassium: 4.2 mmol/L (ref 3.5–5.1)
Sodium: 137 mmol/L (ref 135–145)

## 2021-02-22 LAB — SEDIMENTATION RATE: Sed Rate: 75 mm/hr — ABNORMAL HIGH (ref 0–22)

## 2021-02-22 MED ORDER — HYDROMORPHONE HCL 1 MG/ML IJ SOLN
0.5000 mg | Freq: Three times a day (TID) | INTRAMUSCULAR | Status: DC | PRN
Start: 2021-02-22 — End: 2021-02-25
  Administered 2021-02-22 – 2021-02-24 (×5): 1 mg via INTRAVENOUS
  Filled 2021-02-22 (×5): qty 1

## 2021-02-22 MED ORDER — PRAVASTATIN SODIUM 40 MG PO TABS
40.0000 mg | ORAL_TABLET | Freq: Every day | ORAL | Status: DC
  Administered 2021-02-22 – 2021-03-05 (×12): 40 mg via ORAL
  Filled 2021-02-22 (×12): qty 1

## 2021-02-22 MED ORDER — METHOCARBAMOL 750 MG PO TABS: 750.0000 mg | ORAL_TABLET | Freq: Four times a day (QID) | ORAL | Status: DC | PRN

## 2021-02-22 MED ORDER — SODIUM CHLORIDE 0.9 % IV SOLN: INTRAVENOUS | Status: DC

## 2021-02-22 MED ORDER — METHOCARBAMOL 750 MG PO TABS
750.0000 mg | ORAL_TABLET | Freq: Three times a day (TID) | ORAL | Status: DC
  Administered 2021-02-22 – 2021-02-25 (×9): 750 mg via ORAL
  Filled 2021-02-22 (×9): qty 1

## 2021-02-22 NOTE — Progress Notes (Signed)
Patient ID: Lori Schmidt, female   DOB: 1975/07/09, 45 y.o.   MRN: [id number] Patient is postoperative day 3 left below-knee amputation.  There is no drainage in the wound VAC canister there is wound checkmark.  MRI scan shows edema at the 4 5 disc space.  Recommend consult neurosurgery.  Patient states that she has back pain with coughing.  She states that her shoulder feels better.

## 2021-02-22 NOTE — Progress Notes (Signed)
Physical Therapy Treatment Patient Details Name: Lori Schmidt MRN: [id number] DOB: 1976/01/23 Today's Date: 02/22/2021   History of Present Illness Patient is a 45 y/o female who presents with extensive infection of LLE secondary to MSSA bacteremia now s/p Left BKA 02/19/21. MRI done on 11/12 shows concern for infection in back at L4-5. PMH includes DM, HTN, obesity.    PT Comments    Patient received in bed, she is agreeable to PT session. Patient's mother at bedside. She reports severe back pain. Reports L LE is not bothering her at all. Patient requires cues for mobility for effective attempt at transfer. She requires mod assist for rolling to her Right side. Once on her side I raised HOB using bed controls and patient unable to tolerate. Attempted to give her assist to get fully sitting but is unable due to severe pain in back  She will continue to benefit from skilled PT while here to improve mobility and independence.    Recommendations for follow up therapy are one component of a multi-disciplinary discharge planning process, led by the attending physician.  Recommendations may be updated based on patient status, additional functional criteria and insurance authorization.  Follow Up Recommendations  Acute inpatient rehab (3hours/day)     Assistance Recommended at Discharge Frequent or constant Supervision/Assistance  Equipment Recommendations  BSC/3in1;Wheelchair (measurements PT);Wheelchair cushion (measurements PT)    Recommendations for Other Services Rehab consult     Precautions / Restrictions Precautions Precaution Comments: mod fall Required Braces or Orthoses: Other Brace Other Brace: limb protector Restrictions Weight Bearing Restrictions: Yes LLE Weight Bearing: Non weight bearing     Mobility  Bed Mobility Overal bed mobility: Needs Assistance Bed Mobility: Rolling Rolling: Mod assist         General bed mobility comments: patient unable to get  sitting on edge of bed due to back pain. Attempts, but has to stop.    Transfers                   General transfer comment: unable    Ambulation/Gait               General Gait Details: Unable   Stairs             Wheelchair Mobility    Modified Rankin (Stroke Patients Only)       Balance Overall balance assessment: Needs assistance     Sitting balance - Comments: unable to sit eob this session                                    Cognition Arousal/Alertness: Awake/alert Behavior During Therapy: WFL for tasks assessed/performed Overall Cognitive Status: Within Functional Limits for tasks assessed                         Following Commands: Follows one step commands consistently     Problem Solving: Decreased initiation;Requires verbal cues;Requires tactile cues General Comments: patient alert, cooperative just pain limited        Exercises      General Comments        Pertinent Vitals/Pain Pain Assessment: 0-10 Pain Score: 8  Faces Pain Scale: Hurts whole lot Breathing: occasional labored breathing, short period of hyperventilation Negative Vocalization: occasional moan/groan, low speech, negative/disapproving quality Facial Expression: facial grimacing Body Language: tense, distressed pacing, fidgeting Consolability: no need to console PAINAD Score:  5 Pain Location: back Pain Descriptors / Indicators: Discomfort;Grimacing;Guarding;Moaning;Sore Pain Intervention(s): Limited activity within patient's tolerance;Monitored during session;Repositioned;Premedicated before session    Home Living                          Prior Function            PT Goals (current goals can now be found in the care plan section) Acute Rehab PT Goals Patient Stated Goal: less pain, be able to get up PT Goal Formulation: With patient Time For Goal Achievement: 03/06/21 Potential to Achieve Goals: Fair Additional  Goals Additional Goal #1: Pt will self propel w/c 100' to improve independence with mobility. Progress towards PT goals: Not progressing toward goals - comment (pain limited)    Frequency    Min 3X/week      PT Plan Current plan remains appropriate    Co-evaluation              AM-PAC PT "6 Clicks" Mobility   Outcome Measure  Help needed turning from your back to your side while in a flat bed without using bedrails?: A Lot Help needed moving from lying on your back to sitting on the side of a flat bed without using bedrails?: A Lot Help needed moving to and from a bed to a chair (including a wheelchair)?: Total Help needed standing up from a chair using your arms (e.g., wheelchair or bedside chair)?: Total Help needed to walk in hospital room?: Total Help needed climbing 3-5 steps with a railing? : Total 6 Click Score: 8    End of Session   Activity Tolerance: Patient limited by pain Patient left: in bed;with call bell/phone within reach;with family/visitor present Nurse Communication: Mobility status PT Visit Diagnosis: Other abnormalities of gait and mobility (R26.89);Pain Pain - Right/Left:  (back) Pain - part of body:  (back)     Time: 6222-9798 PT Time Calculation (min) (ACUTE ONLY): 21 min  Charges:  $Therapeutic Activity: 8-22 mins                     Wil Slape, PT, GCS 02/22/21,11:33 AM

## 2021-02-22 NOTE — Progress Notes (Signed)
    [facility] has been requested to perform a transesophageal echocardiogram on 11/15 for Bacteremia.  After careful review of history and examination, the risks and benefits of transesophageal echocardiogram have been explained including risks of esophageal damage, perforation (1:10,000 risk), bleeding, pharyngeal hematoma as well as other potential complications associated with conscious sedation including aspiration, arrhythmia, respiratory failure and death. Alternatives to treatment were discussed, questions were answered. Patient is willing to proceed.   Laverda Page, NP-C 02/22/2021 3:38 PM

## 2021-02-22 NOTE — Progress Notes (Signed)
Inpatient Rehabilitation Admissions Coordinator   I met at bedside with patient and her Mom. We discussed goals and expectations of a possible Cir admit. Patient currently not at  level to tolerate the intensity required of a CIR admit due to her back pain. I will follow her progress. If unable to tolerate Cir level rehab, I discussed the need for SNF level rehab.  Danne Baxter, RN, MSN Rehab Admissions Coordinator [phone] 02/22/2021 11:55 AM

## 2021-02-22 NOTE — Progress Notes (Addendum)
Hospital day: 4  Subjective:  Lori Schmidt is a 45 y.o. F with PMH of DM, HTN, HLD, and depression who presented with [name] complicated by osteomyelitis and MSSA bacteremia, now post-op day 3 s/p L transtibial amputation.    Overnight event: No acute events.  Patient seen at bedside during morning rounds. She is accompanied by her mother. She reports that she is experiencing sporadic back spasms. She states that they are painful but feel about the same as yesterday and the day before. She states that she is not feeling pain from her surgical site.   Objective:  Vital signs in last 24 hours: Vitals:   02/22/21 0041 02/22/21 0549 02/22/21 0703 02/22/21 0713  BP: 96/65 (!) 97/50 106/75 102/65  Pulse: 91 93 95 93  Resp: 17 18 20 16   Temp: 97.8 F (36.6 C) 98.2 F (36.8 C)  97.9 F (36.6 C)  TempSrc: Oral Oral  Oral  SpO2: 92% 91% 92% 91%  Weight:      Height:        Filed Weights   02/18/21 0118  Weight: 121.2 kg     Intake/Output Summary (Last 24 hours) at 02/22/2021 0958 Last data filed at 02/21/2021 2123 Gross per 24 hour  Intake 100 ml  Output 400 ml  Net -300 ml   Net IO Since Admission: 2,451.39 mL [02/22/21 0958]  Recent Labs    02/21/21 1717 02/21/21 2224 02/22/21 0802  GLUCAP 94 111* 96     Pertinent Labs: CBC Latest Ref Rng & Units 02/22/2021 02/21/2021 02/20/2021  WBC 4.0 - 10.5 K/uL 9.7 11.0(H) 9.0  Hemoglobin 12.0 - 15.0 g/dL 13/03/2021) 4.9(F) 0.2(O)  Hematocrit 36.0 - 46.0 % 29.2(L) 29.4(L) 27.9(L)  Platelets 150 - 400 K/uL 284 264 199   BMP Latest Ref Rng & Units 02/22/2021 02/21/2021 02/20/2021  Glucose 70 - 99 mg/dL 85 13/03/2021) 588(F)  BUN 6 - 20 mg/dL 12 13 12   Creatinine 0.44 - 1.00 mg/dL 027(X) ) 4.12(I  Sodium 135 - 145 mmol/L 137 135 132(L)  Potassium 3.5 - 5.1 mmol/L 4.2 4.0 3.8  Chloride 98 - 111 mmol/L 105 103 102  CO2 22 - 32 mmol/L 22 24 22   Calcium 8.9 - 10.3 mg/dL 7.3(L) 7.7(L) 7.7(L)   Results for orders  placed or performed during the hospital encounter of 02/17/21  Resp Panel by RT-PCR (Flu A&B, Covid) Nasopharyngeal Swab     Status: None  Culture, blood (routine x 2)     Status: None (Preliminary result)   Collection Time: 02/19/21  1:18 AM   Specimen: BLOOD  Result Value Ref Range Status   Specimen Description BLOOD RIGHT ANTECUBITAL  Final   Special Requests   Final    BOTTLES DRAWN AEROBIC AND ANAEROBIC Blood Culture adequate volume   Culture   Final    NO GROWTH 3 DAYS Performed at [facility], [address] [place], [facility] [address]    Report Status PENDING  Incomplete  Culture, blood (routine x 2)     Status: None (Preliminary result)   Collection Time: 02/19/21  1:28 AM   Specimen: BLOOD RIGHT HAND  Result Value Ref Range Status   Specimen Description BLOOD RIGHT HAND  Final   Special Requests   Final    BOTTLES DRAWN AEROBIC AND ANAEROBIC Blood Culture adequate volume   Culture   Final    NO GROWTH 3 DAYS Performed at [facility], [address], [place], [facility]  [place]    Report Status PENDING  Incomplete   Physical Exam  General: Alert, laying in bed, NAD CV: RRR, no murmurs heard Pulmonary: Lungs CTAB anteriorly, normal WOB Abdominal: Soft, non-distended  Extremities: LLE amputation wrapped with guaze with prosthetic shrinker and limb protector  Skin: Warm, dry  Neuro: A&Ox3, no focal deficits  Psych: Appropriate mood and affect    Assessment/Plan: Lori Schmidt is a 45 y.o. female with hx of DM, HTN, HLD presenting with [name] complicated by osteomyelitis, MSSA bacteremia, and discitis.   Active Problems:   Bacteremia due to Staphylococcus aureus   Diabetic osteomyelitis (HCC)   Left leg cellulitis   Subacute osteomyelitis, left ankle and foot ([place])  #MSSA bacteremia  #Osteomyelitis  #Discitis  #s/p L transtibial amputation  Patient POD#3 s/p L transtibial amputation. No pain from amputation site but still  experiencing back spasms, likely due to muscle irritation from discitis. Overall improving - WBC has normalized to 9.5, down from 11, and patient has remained afebrile for 24 hours. No growth on blood cultures for 3 days. ID continuing to follow, TEE scheduled for tomorrow at 8 AM. Continuing min 6 week course of IV Ancef.  Working on transitioning IV pain meds to oral.  -Change PRN Robaxin 750mg  to scheduled q8h for back pain  -Robitussin 56mL po q4h PRN cough  -Tylenol PRN mild pain  -Oxycodone 5mg  po q4h PRN mod pain, 10mg  po q4h severe pain   -IV Diluadid q8h PRN severe pain  -ID consulted, appreciate recs -IV Ancef 2g -f/u TEE  -f/u blood cultures  -PT/OT continuing to follow   #HLD  #HTN Blood pressures remain normotensive, most recent 102/65. Pharmacy recommends increasing dose of pravastatin for primary prevention.  -Increase pravastatin to 40 mg po daily  -Hold losartan   #DM  Good BG control, CBG ranging from 96-125 today. Continue current regimen.  -Levemir 50U BID  -Novolog 6U TID with meals  -SSI, routine CBG monitoring   #Depression  -Celexa 40mg  po daily   Diet: Carb modified  IVF: IV NS 75mL/hr  VTE: Lovenox CODE: Full  Prior to Admission Living Arrangement: Home Anticipated Discharge Location: CIR  Barriers to Discharge: Medical stability  Dispo: Admit to inpatient with anticipated stay >2 midnights   Signed: Stefani Dama, Medical Student 02/22/2021, 9:59 AM  Internal Medicine Teaching Service [phone] Please contact the on call pager after 5 pm and on weekends at [phone].

## 2021-02-23 ENCOUNTER — Inpatient Hospital Stay (HOSPITAL_COMMUNITY): Payer: Managed Care, Other (non HMO)

## 2021-02-23 ENCOUNTER — Inpatient Hospital Stay (HOSPITAL_COMMUNITY): Payer: Managed Care, Other (non HMO) | Admitting: Certified Registered"

## 2021-02-23 ENCOUNTER — Encounter (HOSPITAL_COMMUNITY)
Admission: EM | Disposition: A | Discharge status: Rehabilitation Facility | Payer: Self-pay | Source: Home / Self Care | Attending: Internal Medicine

## 2021-02-23 DIAGNOSIS — M4646 Discitis, unspecified, lumbar region: Secondary | ICD-10-CM | POA: Diagnosis not present

## 2021-02-23 DIAGNOSIS — I10 Essential (primary) hypertension: Secondary | ICD-10-CM | POA: Diagnosis not present

## 2021-02-23 DIAGNOSIS — R7881 Bacteremia: Secondary | ICD-10-CM | POA: Diagnosis not present

## 2021-02-23 DIAGNOSIS — B9561 Methicillin susceptible Staphylococcus aureus infection as the cause of diseases classified elsewhere: Secondary | ICD-10-CM | POA: Diagnosis not present

## 2021-02-23 DIAGNOSIS — E1169 Type 2 diabetes mellitus with other specified complication: Secondary | ICD-10-CM | POA: Diagnosis not present

## 2021-02-23 HISTORY — PX: TEE WITHOUT CARDIOVERSION: SHX5443

## 2021-02-23 LAB — BASIC METABOLIC PANEL
Anion gap: 9 (ref 5–15)
BUN: 12 mg/dL (ref 6–20)
CO2: 25 mmol/L (ref 22–32)
Calcium: 7.3 mg/dL — ABNORMAL LOW (ref 8.9–10.3)
Chloride: 104 mmol/L (ref 98–111)
Creatinine, Ser: 0.37 mg/dL — ABNORMAL LOW (ref 0.44–1.00)
GFR, Estimated: >60 mL/min (ref >60)
Glucose, Bld: 109 mg/dL — ABNORMAL HIGH (ref 70–99)
Potassium: 4.2 mmol/L (ref 3.5–5.1)
Sodium: 138 mmol/L (ref 135–145)

## 2021-02-23 LAB — GLUCOSE, CAPILLARY
Glucose-Capillary: 93 mg/dL (ref 70–99)
Glucose-Capillary: 77 mg/dL (ref 70–99)
Glucose-Capillary: 78 mg/dL (ref 70–99)
Glucose-Capillary: 75 mg/dL (ref 70–99)

## 2021-02-23 LAB — CBC
HCT: 30.7 % — ABNORMAL LOW (ref 36.0–46.0)
Hemoglobin: 9.5 g/dL — ABNORMAL LOW (ref 12.0–15.0)
MCH: 27.5 pg (ref 26.0–34.0)
MCHC: 30.9 g/dL (ref 30.0–36.0)
MCV: 89 fL (ref 80.0–100.0)
Platelets: 309 10*3/uL (ref 150–400)
RBC: 3.45 MIL/uL — ABNORMAL LOW (ref 3.87–5.11)
RDW: 14.1 % (ref 11.5–15.5)
WBC: 8.6 10*3/uL (ref 4.0–10.5)
nRBC: 0 % (ref 0.0–0.2)

## 2021-02-23 SURGERY — ECHOCARDIOGRAM, TRANSESOPHAGEAL
Anesthesia: Monitor Anesthesia Care

## 2021-02-23 MED ORDER — PROPOFOL 500 MG/50ML IV EMUL
INTRAVENOUS | Status: DC | PRN
  Administered 2021-02-23: 100 ug/kg/min via INTRAVENOUS

## 2021-02-23 MED ORDER — BISACODYL 10 MG RE SUPP
10.0000 mg | Freq: Once | RECTAL | Status: DC
  Filled 2021-02-23: qty 1

## 2021-02-23 MED ORDER — BISACODYL 10 MG RE SUPP: 10.0000 mg | Freq: Every day | RECTAL | Status: DC | PRN

## 2021-02-23 MED ORDER — INSULIN DETEMIR 100 UNIT/ML ~~LOC~~ SOLN
50.0000 [IU] | Freq: Two times a day (BID) | SUBCUTANEOUS | Status: DC
Start: 2021-02-24 — End: 2021-02-24
  Filled 2021-02-23 (×2): qty 0.5

## 2021-02-23 MED ORDER — HYDROMORPHONE HCL 1 MG/ML IJ SOLN
0.5000 mg | Freq: Once | INTRAMUSCULAR | Status: AC
  Administered 2021-02-23: 0.5 mg via INTRAVENOUS
  Filled 2021-02-23: qty 0.5

## 2021-02-23 MED ORDER — HYDROMORPHONE HCL 1 MG/ML IJ SOLN
1.0000 mg | Freq: Once | INTRAMUSCULAR | Status: AC
  Administered 2021-02-23: 1 mg via INTRAVENOUS
  Filled 2021-02-23: qty 1

## 2021-02-23 MED ORDER — POLYETHYLENE GLYCOL 3350 17 G PO PACK
17.0000 g | PACK | Freq: Every day | ORAL | Status: DC
  Administered 2021-02-24 – 2021-03-05 (×3): 17 g via ORAL
  Filled 2021-02-23 (×9): qty 1

## 2021-02-23 MED ORDER — SODIUM CHLORIDE 0.9 % IV SOLN: INTRAVENOUS | Status: DC | PRN

## 2021-02-23 MED ORDER — ENSURE ENLIVE PO LIQD
237.0000 mL | Freq: Two times a day (BID) | ORAL | Status: DC
  Administered 2021-02-24 – 2021-03-05 (×15): 237 mL via ORAL

## 2021-02-23 MED ORDER — PHENYLEPHRINE 40 MCG/ML (10ML) SYRINGE FOR IV PUSH (FOR BLOOD PRESSURE SUPPORT)
PREFILLED_SYRINGE | INTRAVENOUS | Status: DC | PRN
  Administered 2021-02-23: 120 ug via INTRAVENOUS

## 2021-02-23 MED ORDER — POLYETHYLENE GLYCOL 3350 17 G PO PACK
17.0000 g | PACK | Freq: Two times a day (BID) | ORAL | Status: DC
  Administered 2021-02-23: 17 g via ORAL
  Filled 2021-02-23: qty 1

## 2021-02-23 NOTE — Transfer of Care (Signed)
Immediate Anesthesia Transfer of Care Note  Patient: Lori Schmidt  Procedure(s) Performed: TRANSESOPHAGEAL ECHOCARDIOGRAM (TEE)  Patient Location: Endoscopy Unit  Anesthesia Type:MAC  Level of Consciousness: awake, alert  and oriented  Airway & Oxygen Therapy: Patient Spontanous Breathing and Patient connected to nasal cannula oxygen  Post-op Assessment: Report given to RN and Post -op Vital signs reviewed and stable  Post vital signs: Reviewed and stable  Last Vitals:  Vitals Value Taken Time  BP    Temp    Pulse    Resp    SpO2      Last Pain:  Vitals:   02/23/21 0726  TempSrc: Oral  PainSc: 7       Patients Stated Pain Goal: 2 (34/14/43 6016)  Complications: No notable events documented.

## 2021-02-23 NOTE — Progress Notes (Signed)
Subjective:  Lori Schmidt is a 45 y.o. female with DM, HTN, HLD, and depression admitted for [name] complicated by osteomyelitis and MSSA bacteremia who is POD 4 s/p L transtibial amputation.   Overnight event: NPO at midnight for TEE, required additional IV Dilaudid 0.67m for 8/10 back pain.   Patient seen at bedside during morning rounds. She endorses worsening back pain and states she is unsure if the muscle relaxer helps. No pain at site of amputation. Has been very gassy but no BM for >1 week. Underwent TEE earlier this morning. States she is unsure if she is experiencing numbness as she is too focused on the pain.   Objective:  Vital signs in last 24 hours: Vitals:   02/23/21 0827 02/23/21 0831 02/23/21 0845 02/23/21 0916  BP: 99/62 113/70 116/69 129/78  Pulse: (!) 101 (!) 101 (!) 101 (!) 101  Resp: (!) 21 (!) 22 (!) 22 (!) 22  Temp: 98.2 F (36.8 C)   98.4 F (36.9 C)  TempSrc:      SpO2:   92% 90%  Weight:      Height:        Filed Weights   02/18/21 0118  Weight: 121.2 kg    Recent Labs    02/22/21 2123 02/23/21 0920 02/23/21 1205  GLUCAP 101* 93 77     Pertinent Labs: CBC Latest Ref Rng & Units 02/23/2021 02/22/2021 02/21/2021  WBC 4.0 - 10.5 K/uL 8.6 9.7 11.0(H)  Hemoglobin 12.0 - 15.0 g/dL 9.5(L) 9.5(L) 9.5(L)  Hematocrit 36.0 - 46.0 % 30.7(L) 29.2(L) 29.4(L)  Platelets 150 - 400 K/uL 309 284 264    BMP Latest Ref Rng & Units 02/23/2021 02/22/2021 02/21/2021  Glucose 70 - 99 mg/dL 109(H) 85 137(H)  BUN 6 - 20 mg/dL _0 Creatinine 0.44 - 1.00 mg/dL 0.37(L) 0.35(L) 0.43(L)  Sodium 135 - 145 mmol/L 138 137 135  Potassium 3.5 - 5.1 mmol/L 4.2 4.2 4.0  Chloride 98 - 111 mmol/L 104 105 103  CO2 22 - 32 mmol/L _1 Calcium 8.9 - 10.3 mg/dL 7.3(L) 7.3(L) 7.7(L)   Micro Results:  Culture, blood (routine x 2)     Status: None (Preliminary result)   Collection Time: 02/19/21  1:18 AM   Specimen: BLOOD  Result Value Ref Range Status    Specimen Description BLOOD RIGHT ANTECUBITAL  Final   Special Requests   Final    BOTTLES DRAWN AEROBIC AND ANAEROBIC Blood Culture adequate volume   Culture   Final    NO GROWTH 3 DAYS Performed at [facility] [address], [place] [id number]   Report Status PENDING  Incomplete  Culture, blood (routine x 2)     Status: None (Preliminary result)   Collection Time: 02/19/21  1:28 AM   Specimen: BLOOD RIGHT HAND  Result Value Ref Range Status   Specimen Description BLOOD RIGHT HAND  Final   Special Requests   Final    BOTTLES DRAWN AEROBIC AND ANAEROBIC Blood Culture adequate volume   Culture   Final    NO GROWTH 3 DAYS Performed at [facility] [address] [place]  [id number]   Report Status PENDING  Incomplete  Imaging:  TEE 11/15  Findings:   LEFT VENTRICLE: The left ventricular wall thickness is normal.  The left ventricular cavity is normal in size. Wall motion is normal.  LVEF is 60-65%. 2.  RIGHT VENTRICLE:  The  right ventricle is normal in structure and function without any thrombus or masses.   LEFT ATRIUM:  The left atrium is normal in size without any thrombus or masses.  There is not spontaneous echo contrast ("smoke") in the left atrium consistent with a low flow state. LEFT ATRIAL APPENDAGE:  The left atrial appendage is free of any thrombus or masses. The appendage has single lobes. Pulse doppler indicates moderate flow in the appendage. ATRIAL SEPTUM:  The atrial septum appears intact and is free of thrombus and/or masses.  There is no evidence for interatrial shunting by color doppler. RIGHT ATRIUM:  The right atrium is normal in size and function without any thrombus or masses. MITRAL VALVE:  The mitral valve is slightly abnormal with a small amount of P2 prolapse and associated  trivial  regurgitation.  There were no vegetations or stenosis. AORTIC VALVE:  The aortic valve is trileaflet, normal in structure and function with  no   regurgitation.  There were no vegetations or stenosis TRICUSPID VALVE:  The tricuspid valve is normal in structure and function with  trivial  regurgitation.  There were no vegetations or stenosis  PULMONIC VALVE:  The pulmonic valve is normal in structure and function with  trivial  regurgitation.  There were no vegetations or stenosis. AORTIC ARCH, ASCENDING AND DESCENDING AORTA:  There was grade 1 Ron Parker et. Al, 1992) atherosclerosis of the ascending aorta, aortic arch, or proximal descending aorta. 12. PULMONARY VEINS: Anomalous pulmonary venous return was not noted. 13. PERICARDIUM: The pericardium appeared normal and non-thickened.  There is no pericardial effusion.   IMPRESSION:  No evidence for endocarditis Minimal prolapse of the P2 segment of the mitral valve with trivial regurgitation LVEF 60-65%, normal wall motion   RECOMMENDATIONS:    Antibiotics per ID for MSSA bacteremia without endocarditis  Physical Exam General: Alert, laying in bed, appears mildly distressed due to back pain  CV: RRR, no murmurs heard  Pulmonary: Lungs clear anteriorly, normal WOB on RA  Abdominal: Mildly distended, non-tender. Normal bowel sounds  Extremities: L transtibial amputation site wrapped with gauze, prosthetic shrinker and limb protector present  Skin: Warm, dry  Neuro: A&Ox3 Psych: Appropriate behavior and affect   Assessment/Plan: [name] is a 45 y.o. female with hx of DM, HTN, HLD, and depression admitted for [name] complicated by osteomyelitis and MSSA bacteremia who is POD 4 s/p L transtibial amputation.   Active Problems:   Bacteremia due to Staphylococcus aureus   Diabetic osteomyelitis (HCC)   Left leg cellulitis   Subacute osteomyelitis, left ankle and foot ([place])  #MSSA bacteremia  #Osteomyelitis  #Discitis  Afebrile for the last 2 days, no growth on BC for 4 days, and WBC continuing to decrease to 8.6. However patient continues to experience  significant and worsening back pain, likely due to discitis. Called neurosurgery yesterday and spoke with on-call provider, [name], NP. No surgical intervention recommended at this time, continue antimicrobial therapy. Also recommended we check CRP and sed rate which were both elevated (CRP 19.7, sed rate 75). Will trend CRP and sed rate every few days and re-consult if back pain continues to worsen or if any neuro deficits arise. TEE showed no vegetations, ruling out endocarditis. Will continue pain management with IV Dilaudid and oxycodone, as well as scheduled bowel regimen for constipation. PT/OT continuing to follow.  -Continue IV Ancef for 6 weeks  -Robaxin 788m po q8h  -Tylenol PRN mild pain  -Oxycodone 571mpo q4h PRN  mod pain, 52m po q4h PRN severe pain  -IV Dilaudid q8h PRN severe pain -f/u blood cultures  -recheck ESR, CRP later this week  -bowel regimen with scheduled Miralax, Colace, and Dulcolax  #HLD #HTN  BP normotensive, most recently 122/81. Continue to hold Losartan. -Pravastatin 455mpo daily  #DM  CBG ranging from 77-101 today, continue current regimen.  -Levemir 50U BID  -Novology 6U TID with meals  -SSI, routine CBG monitoring   #Depression  Celexa 4022mo daily   Diet: Carb modified  IVF: IV NS at 10 mL/hr  VTE: Lovenox  CODE: Full   Prior to Admission Living Arrangement: Home  Anticipated Discharge Location: CIR  Barriers to Discharge: Medical stability  Dispo: Admit to inpatient with anticipated stay > 2 midnights   Signed: BlaStefani Damaedical Student 02/23/2021, 11:43 AM  Internal Medicine Teaching Service [phone] contact the on call pager after 5 pm and on weekends at [phone]

## 2021-02-23 NOTE — Interval H&P Note (Signed)
History and Physical Interval Note:  02/23/2021 7:43 AM  Lori Schmidt  has presented today for surgery, with the diagnosis of BACTEREMIA.  The various methods of treatment have been discussed with the patient and family. After consideration of risks, benefits and other options for treatment, the patient has consented to  Procedure(s): TRANSESOPHAGEAL ECHOCARDIOGRAM (TEE) (N/A) as a surgical intervention.  The patient's history has been reviewed, patient examined, no change in status, stable for surgery.  I have reviewed the patient's chart and labs.  Questions were answered to the patient's satisfaction.     Chrystie Nose

## 2021-02-23 NOTE — CV Procedure (Signed)
TRANSESOPHAGEAL ECHOCARDIOGRAM (TEE) NOTE  INDICATIONS: infective endocarditis  PROCEDURE:   Informed consent was obtained prior to the procedure. The risks, benefits and alternatives for the procedure were discussed and the patient comprehended these risks.  Risks include, but are not limited to, cough, sore throat, vomiting, nausea, somnolence, esophageal and stomach trauma or perforation, bleeding, low blood pressure, aspiration, pneumonia, infection, trauma to the teeth and death.    After a procedural time-out, the patient was given propofol per anesthesia for sedation.  The patient's heart rate, blood pressure, and oxygen saturation are monitored continuously during the procedure.The oropharynx was anesthetized with topical cetacaine.  The transesophageal probe was inserted in the esophagus and stomach without difficulty and multiple views were obtained.  The patient was kept under observation until the patient left the procedure room. I was present face-to-face 100% of this time. The patient left the procedure room in stable condition.   Agitated microbubble saline contrast was not administered.  COMPLICATIONS:    There were no immediate complications.  Findings:  LEFT VENTRICLE: The left ventricular wall thickness is normal.  The left ventricular cavity is normal in size. Wall motion is normal.  LVEF is 60-65%.  RIGHT VENTRICLE:  The right ventricle is normal in structure and function without any thrombus or masses.    LEFT ATRIUM:  The left atrium is normal in size without any thrombus or masses.  There is not spontaneous echo contrast ("smoke") in the left atrium consistent with a low flow state.  LEFT ATRIAL APPENDAGE:  The left atrial appendage is free of any thrombus or masses. The appendage has single lobes. Pulse doppler indicates moderate flow in the appendage.  ATRIAL SEPTUM:  The atrial septum appears intact and is free of thrombus and/or masses.  There is no evidence  for interatrial shunting by color doppler.  RIGHT ATRIUM:  The right atrium is normal in size and function without any thrombus or masses.  MITRAL VALVE:  The mitral valve is slightly abnormal with a small amount of P2 prolapse and associated  trivial  regurgitation.  There were no vegetations or stenosis.  AORTIC VALVE:  The aortic valve is trileaflet, normal in structure and function with  no  regurgitation.  There were no vegetations or stenosis  TRICUSPID VALVE:  The tricuspid valve is normal in structure and function with  trivial  regurgitation.  There were no vegetations or stenosis   PULMONIC VALVE:  The pulmonic valve is normal in structure and function with  trivial  regurgitation.  There were no vegetations or stenosis.   AORTIC ARCH, ASCENDING AND DESCENDING AORTA:  There was grade 1 Myrtis Ser et. Al, 1992) atherosclerosis of the ascending aorta, aortic arch, or proximal descending aorta.  12. PULMONARY VEINS: Anomalous pulmonary venous return was not noted.  13. PERICARDIUM: The pericardium appeared normal and non-thickened.  There is no pericardial effusion.  IMPRESSION:   No evidence for endocarditis Minimal prolapse of the P2 segment of the mitral valve with trivial regurgitation LVEF 60-65%, normal wall motion  RECOMMENDATIONS:     Antibiotics per ID for MSSA bacteremia without endocarditis  Time Spent Directly with the Patient:  45 minutes   Chrystie Nose, MD, [facility], FACP  [place]  [facility]  Medical Director of the [facility]  [facility] Diplomate of the American Board of Clinical Lipidology Attending Cardiologist  Direct Dial: [phone]  Fax: [phone]  Website:  [email or web address] 02/23/2021, 8:27  AM    

## 2021-02-23 NOTE — Progress Notes (Signed)
PHARMACY CONSULT NOTE FOR:  OUTPATIENT  PARENTERAL ANTIBIOTIC THERAPY (OPAT)  Indication: MSSA bacteremia/discitis Regimen: cefazolin 2 g IV every 8 hours End date: 04/16/2021  IV antibiotic discharge orders are pended. To discharging provider:  please sign these orders via discharge navigator,  Select New Orders & click on the button choice - Manage This Unsigned Work.     Thank you for allowing pharmacy to be a part of this patient's care.  Lissa Merlin, PharmD PGY1 Acute Care Pharmacy Resident  Phone: [phone] 02/23/2021  2:52 PM  Please check [email or web address] for unit-specific pharmacy phone numbers.

## 2021-02-23 NOTE — Anesthesia Preprocedure Evaluation (Signed)
Anesthesia Evaluation  Patient identified by MRN, date of birth, ID band Patient awake    Reviewed: Allergy & Precautions, H&P , NPO status , Patient's Chart, lab work & pertinent test results  Airway Mallampati: II   Neck ROM: full    Dental   Pulmonary former smoker,    breath sounds clear to auscultation       Cardiovascular hypertension,  Rhythm:regular Rate:Normal     Neuro/Psych    GI/Hepatic   Endo/Other  diabetes, Type 2Morbid obesity  Renal/GU      Musculoskeletal   Abdominal   Peds  Hematology  (+) anemia ,   Anesthesia Other Findings   Reproductive/Obstetrics                             Anesthesia Physical Anesthesia Plan  ASA: 3  Anesthesia Plan: MAC   Post-op Pain Management:    Induction: Intravenous  PONV Risk Score and Plan: 2 and Propofol infusion and Treatment may vary due to age or medical condition  Airway Management Planned: Nasal Cannula  Additional Equipment:   Intra-op Plan:   Post-operative Plan:   Informed Consent: I have reviewed the patients History and Physical, chart, labs and discussed the procedure including the risks, benefits and alternatives for the proposed anesthesia with the patient or authorized representative who has indicated his/her understanding and acceptance.     Dental advisory given  Plan Discussed with: CRNA, Anesthesiologist and Surgeon  Anesthesia Plan Comments:         Anesthesia Quick Evaluation

## 2021-02-23 NOTE — Anesthesia Procedure Notes (Signed)
Procedure Name: MAC Date/Time: 02/23/2021 8:00 AM Performed by: Griffin Dakin, CRNA Pre-anesthesia Checklist: Patient identified, Emergency Drugs available, Suction available, Patient being monitored and Timeout performed Oxygen Delivery Method: Nasal cannula Induction Type: IV induction Placement Confirmation: positive ETCO2 and breath sounds checked- equal and bilateral Dental Injury: Teeth and Oropharynx as per pre-operative assessment

## 2021-02-23 NOTE — Progress Notes (Signed)
PT Cancellation Note  Patient Details Name: Eleri Ruben MRN: [id number] DOB: 1975/12/19   Cancelled Treatment:    Reason Eval/Treat Not Completed: Pain limiting ability to participate;Fatigue/lethargy limiting ability to participate. Patient reports she is exhausted, had procedure this am. She tried to lie on her side for a little while today. Continues to have excruciating pain in back due to infection. We will continue to follow and progress as patient able. Encouraged patient to move LEs and sit more upright in bed as tolerated. She verbalized understanding. Mother present in room.     Nataliya Graig 02/23/2021, 2:09 PM

## 2021-02-23 NOTE — Progress Notes (Signed)
,   [place] for Infectious Disease  Date of Admission:  02/17/2021     Total days of antibiotics 7         ASSESSMENT:  [name] TEE was negative for endocarditis. 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 Discussed plan of care to include continuation of Cefazolin for at least 8 weeks pending clearance of 02/19/21 blood cultures that have remained without growth to date  End date tentatively planned for 04/16/21. Okay for PICC line placement. OPAT/Home Health orders. Will arrange follow up in ID clinic. ID will sign off. Please re-consult if needed.   PLAN:  Continue Cefazolin through 04/16/21 OPAT/Home Health orders PICC line placement prior to discharge.  Will arrange follow up in ID clinic. Remaining medical and supportive care per primary team.  ID will sign off.   Diagnosis:  Disseminated MSSA infection   Culture Result: MSSA  Allergies  Allergen Reactions   Sulfa Antibiotics Other (See Comments)    Makes sick   Lisinopril Cough    OPAT Orders Discharge antibiotics to be given via PICC line Discharge antibiotics: Cefazolin  Per pharmacy protocol  Aim for Vancomycin trough 15-20 or AUC 400-550 (unless otherwise indicated) Duration: 8 weeks  End Date: 04/16/21   [facility] Care Per Protocol:  Home health RN for IV administration and teaching; PICC line care and labs.    Labs weekly while on IV antibiotics: _x_ CBC with differential _x_ BMP __ CMP _x_ CRP _x_ ESR __ Vancomycin trough __ CK  __ Please pull PIC at completion of IV antibiotics _X_ Please leave PIC in place until doctor has seen patient or been notified  Fax weekly labs to [phone]  Clinic Follow Up Appt:  03/31/21 at 10:45am with Dr. Candiss Norse   Active Problems:   Bacteremia due to Staphylococcus aureus   Diabetic osteomyelitis ([place])   Left leg  cellulitis   Subacute osteomyelitis, left ankle and foot (HCC)    vitamin C  1,000 mg Oral Daily   bisacodyl  10 mg Rectal Once   citalopram  40 mg Oral Daily   docusate sodium  100 mg Oral Daily   enoxaparin (LOVENOX) injection  60 mg Subcutaneous Q24H   insulin aspart  0-20 Units Subcutaneous TID WC   insulin aspart  0-5 Units Subcutaneous QHS   insulin aspart  6 Units Subcutaneous TID WC   [START ON 02/24/2021] insulin detemir  50 Units Subcutaneous BID   methocarbamol  750 mg Oral Q8H   nutrition supplement (JUVEN)  1 packet Oral BID BM   pantoprazole  40 mg Oral Daily   [START ON 02/24/2021] polyethylene glycol  17 g Oral Daily   pravastatin  40 mg Oral Daily   vitamin B-12  500 mcg Oral QODAY   zinc sulfate  220 mg Oral Daily    SUBJECTIVE:  Afebrile overnight. Increasing back pain that work her up in the middle of the night feeling like more spasms Denies fevers, chills, numbness or tingling. Mother at bedside.   Allergies  Allergen Reactions   Sulfa Antibiotics Other (See Comments)    Makes sick   Lisinopril Cough     Review of Systems: Review of Systems  Constitutional:  Negative for chills, fever and weight loss.  Respiratory:  Negative for cough, shortness of breath and wheezing.   Cardiovascular:  Negative for chest pain and leg swelling.  Gastrointestinal:  Negative for abdominal pain, constipation, diarrhea, nausea and vomiting.  Musculoskeletal:  Positive for back pain.  Skin:  Negative for rash.     OBJECTIVE: Vitals:   02/23/21 0831 02/23/21 0845 02/23/21 0916 02/23/21 1157  BP: 113/70 116/69 129/78 122/81  Pulse: (!) 101 (!) 101 (!) 101 (!) 109  Resp: (!) 22 (!) 22 (!) 22 18  Temp:   98.4 F (36.9 C) 98.4 F (36.9 C)  TempSrc:      SpO2:  92% 90% (!) 85%  Weight:      Height:       Body mass index is 41.85 kg/m.  Physical Exam Constitutional:      General: She is not in acute distress.    Appearance: She is well-developed.   Cardiovascular:     Rate and Rhythm: Normal rate and regular rhythm.     Heart sounds: Normal heart sounds.  Pulmonary:     Effort: Pulmonary effort is normal.     Breath sounds: Normal breath sounds.  Skin:    General: Skin is warm and dry.  Neurological:     Mental Status: She is alert.  Psychiatric:        Mood and Affect: Mood normal.    Lab Results Lab Results  Component Value Date   WBC 8.6 02/23/2021   HGB 9.5 (L) 02/23/2021   HCT 30.7 (L) 02/23/2021   MCV 89.0 02/23/2021   PLT 309 02/23/2021    Lab Results  Component Value Date   CREATININE 0.37 (L) 02/23/2021   BUN 12 02/23/2021   NA 138 02/23/2021   K 4.2 02/23/2021   CL 104 02/23/2021   CO2 25 02/23/2021    Lab Results  Component Value Date   ALT 27 02/16/2021   AST 27 02/16/2021   ALKPHOS 113 02/16/2021   BILITOT 1.4 (H) 02/16/2021     Microbiology: Recent Results (from the past 240 hour(s))  Resp Panel by RT-PCR (Flu A&B, Covid) Nasopharyngeal Swab     Status: None   Collection Time: 02/16/21  5:37 PM   Specimen: Nasopharyngeal Swab; Nasopharyngeal(NP) swabs in vial transport medium  Result Value Ref Range Status   SARS Coronavirus 2 by RT PCR NEGATIVE NEGATIVE Final    Comment: (NOTE) SARS-CoV-2 target nucleic acids are NOT DETECTED.  The SARS-CoV-2 RNA is generally detectable in upper respiratory specimens during the acute phase of infection. The lowest concentration of SARS-CoV-2 viral copies this assay can detect is 138 copies/mL. A negative result does not preclude SARS-Cov-2 infection and should not be used as the sole basis for treatment or other patient management decisions. A negative result may occur with  improper specimen collection/handling, submission of specimen other than nasopharyngeal swab, presence of viral mutation(s) within the areas targeted by this assay, and inadequate number of viral copies(<138 copies/mL). A negative result must be combined with clinical  observations, patient history, and epidemiological information. The expected result is Negative.  Fact Sheet for Patients:  EntrepreneurPulse.com.au  Fact Sheet for Healthcare Providers:  IncredibleEmployment.be  This test is no t yet approved or cleared by the [place] FDA and  has been authorized for detection and/or diagnosis of SARS-CoV-2 by FDA under an Emergency Use Authorization (EUA). This EUA will remain  in effect (meaning this test can be used) for the duration of the COVID-19 declaration under Section 564(b)(1) of the Act, 21 U.S.C.section 360bbb-3(b)(1), unless the authorization is terminated  or revoked sooner.  Influenza A by PCR NEGATIVE NEGATIVE Final   Influenza B by PCR NEGATIVE NEGATIVE Final    Comment: (NOTE) The Xpert Xpress SARS-CoV-2/FLU/RSV plus assay is intended as an aid in the diagnosis of influenza from Nasopharyngeal swab specimens and should not be used as a sole basis for treatment. Nasal washings and aspirates are unacceptable for Xpert Xpress SARS-CoV-2/FLU/RSV testing.  Fact Sheet for Patients: EntrepreneurPulse.com.au  Fact Sheet for Healthcare Providers: IncredibleEmployment.be  This test is not yet approved or cleared by the [place] FDA and has been authorized for detection and/or diagnosis of SARS-CoV-2 by FDA under an Emergency Use Authorization (EUA). This EUA will remain in effect (meaning this test can be used) for the duration of the COVID-19 declaration under Section 564(b)(1) of the Act, 21 U.S.C. section 360bbb-3(b)(1), unless the authorization is terminated or revoked.  Performed at [facility], [place] [address] [place], [place] [address]   Culture, blood (routine x 2)     Status: Abnormal   Collection Time: 02/16/21  5:45 PM   Specimen: Site Not Specified; Blood  Result Value Ref Range Status   Specimen Description SITE NOT  SPECIFIED  Final   Special Requests   Final    BOTTLES DRAWN AEROBIC AND ANAEROBIC Blood Culture adequate volume   Culture  Setup Time   Final    GRAM POSITIVE COCCI IN CLUSTERS IN BOTH AEROBIC AND ANAEROBIC BOTTLES CRITICAL RESULT CALLED TO, READ BACK BY AND VERIFIED WITH: RN Bernita Raisin [facility] [id number] AT 6812 BY CM Performed at [facility], [place] [address], [place], [place] [address]    Culture STAPHYLOCOCCUS AUREUS (A)  Final   Report Status 02/19/2021 FINAL  Final   Organism ID, Bacteria STAPHYLOCOCCUS AUREUS  Final      Susceptibility   Staphylococcus aureus - MIC*    CIPROFLOXACIN <=0.5 SENSITIVE Sensitive     ERYTHROMYCIN <=0.25 SENSITIVE Sensitive     GENTAMICIN <=0.5 SENSITIVE Sensitive     OXACILLIN <=0.25 SENSITIVE Sensitive     TETRACYCLINE <=1 SENSITIVE Sensitive     VANCOMYCIN 1 SENSITIVE Sensitive     TRIMETH/SULFA <=10 SENSITIVE Sensitive     CLINDAMYCIN <=0.25 SENSITIVE Sensitive     RIFAMPIN <=0.5 SENSITIVE Sensitive     Inducible Clindamycin NEGATIVE Sensitive     * STAPHYLOCOCCUS AUREUS  Blood Culture ID Panel (Reflexed)     Status: Abnormal   Collection Time: 02/16/21  5:45 PM  Result Value Ref Range Status   Enterococcus faecalis NOT DETECTED NOT DETECTED Final   Enterococcus Faecium NOT DETECTED NOT DETECTED Final   Listeria monocytogenes NOT DETECTED NOT DETECTED Final   Staphylococcus species DETECTED (A) NOT DETECTED Final    Comment: CRITICAL RESULT CALLED TO, READ BACK BY AND VERIFIED WITH: RN J BAILEY [facility] [id number] AT 1307 BY CM     Staphylococcus aureus (BCID) DETECTED (A) NOT DETECTED Final    Comment: CRITICAL RESULT CALLED TO, READ BACK BY AND VERIFIED WITH: RN J BAILEY [facility] [id number] AT 1638 BY CM    Staphylococcus epidermidis NOT DETECTED NOT DETECTED Final   Staphylococcus lugdunensis NOT DETECTED NOT DETECTED Final   Streptococcus species NOT DETECTED NOT DETECTED Final   Streptococcus agalactiae NOT DETECTED NOT DETECTED Final    Streptococcus pneumoniae NOT DETECTED NOT DETECTED Final   Streptococcus pyogenes NOT DETECTED NOT DETECTED Final   A.calcoaceticus-baumannii NOT DETECTED NOT DETECTED Final   Bacteroides fragilis NOT DETECTED NOT DETECTED Final   Enterobacterales NOT DETECTED NOT DETECTED  Final   Enterobacter cloacae complex NOT DETECTED NOT DETECTED Final   Escherichia coli NOT DETECTED NOT DETECTED Final   Klebsiella aerogenes NOT DETECTED NOT DETECTED Final   Klebsiella oxytoca NOT DETECTED NOT DETECTED Final   Klebsiella pneumoniae NOT DETECTED NOT DETECTED Final   Proteus species NOT DETECTED NOT DETECTED Final   Salmonella species NOT DETECTED NOT DETECTED Final   Serratia marcescens NOT DETECTED NOT DETECTED Final   Haemophilus influenzae NOT DETECTED NOT DETECTED Final   Neisseria meningitidis NOT DETECTED NOT DETECTED Final   Pseudomonas aeruginosa NOT DETECTED NOT DETECTED Final   Stenotrophomonas maltophilia NOT DETECTED NOT DETECTED Final   Candida albicans NOT DETECTED NOT DETECTED Final   Candida auris NOT DETECTED NOT DETECTED Final   Candida glabrata NOT DETECTED NOT DETECTED Final   Candida krusei NOT DETECTED NOT DETECTED Final   Candida parapsilosis NOT DETECTED NOT DETECTED Final   Candida tropicalis NOT DETECTED NOT DETECTED Final   Cryptococcus neoformans/gattii NOT DETECTED NOT DETECTED Final   Meth resistant mecA/C and MREJ NOT DETECTED NOT DETECTED Final    Comment: Performed at [facility], [place] [address] [place], [place] [address]  Resp Panel by RT-PCR (Flu A&B, Covid) Nasopharyngeal Swab     Status: None   Collection Time: 02/17/21  8:39 PM   Specimen: Nasopharyngeal Swab; Nasopharyngeal(NP) swabs in vial transport medium  Result Value Ref Range Status   SARS Coronavirus 2 by RT PCR NEGATIVE NEGATIVE Final    Comment: (NOTE) SARS-CoV-2 target nucleic acids are NOT DETECTED.  The SARS-CoV-2 RNA is generally detectable in upper respiratory specimens during the  acute phase of infection. The lowest concentration of SARS-CoV-2 viral copies this assay can detect is 138 copies/mL. A negative result does not preclude SARS-Cov-2 infection and should not be used as the sole basis for treatment or other patient management decisions. A negative result may occur with  improper specimen collection/handling, submission of specimen other than nasopharyngeal swab, presence of viral mutation(s) within the areas targeted by this assay, and inadequate number of viral copies(<138 copies/mL). A negative result must be combined with clinical observations, patient history, and epidemiological information. The expected result is Negative.  Fact Sheet for Patients:  EntrepreneurPulse.com.au  Fact Sheet for Healthcare Providers:  IncredibleEmployment.be  This test is no t yet approved or cleared by the [place] FDA and  has been authorized for detection and/or diagnosis of SARS-CoV-2 by FDA under an Emergency Use Authorization (EUA). This EUA will remain  in effect (meaning this test can be used) for the duration of the COVID-19 declaration under Section 564(b)(1) of the Act, 21 U.S.C.section 360bbb-3(b)(1), unless the authorization is terminated  or revoked sooner.       Influenza A by PCR NEGATIVE NEGATIVE Final   Influenza B by PCR NEGATIVE NEGATIVE Final    Comment: (NOTE) The Xpert Xpress SARS-CoV-2/FLU/RSV plus assay is intended as an aid in the diagnosis of influenza from Nasopharyngeal swab specimens and should not be used as a sole basis for treatment. Nasal washings and aspirates are unacceptable for Xpert Xpress SARS-CoV-2/FLU/RSV testing.  Fact Sheet for Patients: EntrepreneurPulse.com.au  Fact Sheet for Healthcare Providers: IncredibleEmployment.be  This test is not yet approved or cleared by the [place] FDA and has been authorized for detection and/or  diagnosis of SARS-CoV-2 by FDA under an Emergency Use Authorization (EUA). This EUA will remain in effect (meaning this test can be used) for the duration of the COVID-19 declaration under Section 564(b)(1)  of the Act, 21 U.S.C. section 360bbb-3(b)(1), unless the authorization is terminated or revoked.  Performed at [facility], [place] [address] [place], [place] [address]   Culture, blood (routine x 2)     Status: None (Preliminary result)   Collection Time: 02/19/21  1:18 AM   Specimen: BLOOD  Result Value Ref Range Status   Specimen Description BLOOD RIGHT ANTECUBITAL  Final   Special Requests   Final    BOTTLES DRAWN AEROBIC AND ANAEROBIC Blood Culture adequate volume   Culture   Final    NO GROWTH 4 DAYS Performed at [facility], [place] [address] [place], [place] [address]    Report Status PENDING  Incomplete  Culture, blood (routine x 2)     Status: None (Preliminary result)   Collection Time: 02/19/21  1:28 AM   Specimen: BLOOD RIGHT HAND  Result Value Ref Range Status   Specimen Description BLOOD RIGHT HAND  Final   Special Requests   Final    BOTTLES DRAWN AEROBIC AND ANAEROBIC Blood Culture adequate volume   Culture   Final    NO GROWTH 4 DAYS Performed at [facility], [place] [address], [place], [place] [address]    Report Status PENDING  Incomplete     Terri Piedra, [place] for Infectious Disease [facility]  02/23/2021  3:23 PM

## 2021-02-23 NOTE — Progress Notes (Signed)
  Echocardiogram Echocardiogram Transesophageal has been performed.  Augustine Radar 02/23/2021, 8:37 AM

## 2021-02-23 NOTE — Progress Notes (Signed)
OT Cancellation Note  Patient Details Name: Lori Schmidt MRN: [id number] DOB: [birth year]   Cancelled Treatment:    Reason Eval/Treat Not Completed: Pain limiting ability to participate;Fatigue/lethargy limiting ability to participate. Pt had procedure this morning and reports feeling too tired and too much pain to participate in OT session today. Requesting OT come back tomorrow. Will follow as able.  Hillard Danker, OT/L  Acute Rehab [phone]  Lenice Llamas 02/23/2021, 4:42 PM

## 2021-02-23 NOTE — Progress Notes (Signed)
Pt c/o 8/10 back pain but is now NPO for TEE procedure. Provider paged to see if diet order can be changed to NPO with sips for meds or if dilaudid could be reordered as a one time dose since current order is q 8hrs, last dose given at 2320. See new orders.

## 2021-02-24 DIAGNOSIS — M4646 Discitis, unspecified, lumbar region: Secondary | ICD-10-CM | POA: Diagnosis not present

## 2021-02-24 DIAGNOSIS — Z794 Long term (current) use of insulin: Secondary | ICD-10-CM

## 2021-02-24 DIAGNOSIS — E1169 Type 2 diabetes mellitus with other specified complication: Secondary | ICD-10-CM | POA: Diagnosis not present

## 2021-02-24 LAB — CULTURE, BLOOD (ROUTINE X 2)
Culture: NO GROWTH
Culture: NO GROWTH
Special Requests: ADEQUATE
Special Requests: ADEQUATE

## 2021-02-24 LAB — CBC
HCT: 31.6 % — ABNORMAL LOW (ref 36.0–46.0)
Hemoglobin: 9.8 g/dL — ABNORMAL LOW (ref 12.0–15.0)
MCH: 27.6 pg (ref 26.0–34.0)
MCHC: 31 g/dL (ref 30.0–36.0)
MCV: 89 fL (ref 80.0–100.0)
Platelets: 340 10*3/uL (ref 150–400)
RBC: 3.55 MIL/uL — ABNORMAL LOW (ref 3.87–5.11)
RDW: 13.9 % (ref 11.5–15.5)
WBC: 9.1 10*3/uL (ref 4.0–10.5)
nRBC: 0.2 % (ref 0.0–0.2)

## 2021-02-24 LAB — BASIC METABOLIC PANEL
Anion gap: 10 (ref 5–15)
BUN: 9 mg/dL (ref 6–20)
CO2: 24 mmol/L (ref 22–32)
Calcium: 7.5 mg/dL — ABNORMAL LOW (ref 8.9–10.3)
Chloride: 104 mmol/L (ref 98–111)
Creatinine, Ser: 0.38 mg/dL — ABNORMAL LOW (ref 0.44–1.00)
GFR, Estimated: >60 mL/min (ref >60)
Glucose, Bld: 60 mg/dL — ABNORMAL LOW (ref 70–99)
Potassium: 3.6 mmol/L (ref 3.5–5.1)
Sodium: 138 mmol/L (ref 135–145)

## 2021-02-24 LAB — SURGICAL PATHOLOGY

## 2021-02-24 LAB — GLUCOSE, CAPILLARY
Glucose-Capillary: 80 mg/dL (ref 70–99)
Glucose-Capillary: 209 mg/dL — ABNORMAL HIGH (ref 70–99)
Glucose-Capillary: 160 mg/dL — ABNORMAL HIGH (ref 70–99)
Glucose-Capillary: 180 mg/dL — ABNORMAL HIGH (ref 70–99)

## 2021-02-24 MED ORDER — INSULIN DETEMIR 100 UNIT/ML ~~LOC~~ SOLN
25.0000 [IU] | Freq: Every day | SUBCUTANEOUS | Status: DC
Start: 2021-02-24 — End: 2021-02-24
  Filled 2021-02-24: qty 0.25

## 2021-02-24 MED ORDER — INSULIN DETEMIR 100 UNIT/ML ~~LOC~~ SOLN
25.0000 [IU] | Freq: Every day | SUBCUTANEOUS | Status: DC
  Administered 2021-02-24: 25 [IU] via SUBCUTANEOUS
  Filled 2021-02-24 (×2): qty 0.25

## 2021-02-24 MED ORDER — INSULIN DETEMIR 100 UNIT/ML ~~LOC~~ SOLN
50.0000 [IU] | Freq: Every day | SUBCUTANEOUS | Status: DC
Start: 2021-02-24 — End: 2021-02-24
  Filled 2021-02-24: qty 0.5

## 2021-02-24 NOTE — Progress Notes (Signed)
Inpatient Rehabilitation Admissions Coordinator   Patient not yet at a level to consider her for possible admit to CIR/Inpt rehab admit due to pain limiting ability to tolerate the intensity required to pursue. I will continue to follow her progress.  Ottie Glazier, RN, MSN Rehab Admissions Coordinator [phone] 02/24/2021 8:51 AM

## 2021-02-24 NOTE — Anesthesia Postprocedure Evaluation (Signed)
Anesthesia Post Note  Patient: Lori Schmidt  Procedure(s) Performed: TRANSESOPHAGEAL ECHOCARDIOGRAM (TEE)     Patient location during evaluation: Endoscopy Anesthesia Type: MAC Level of consciousness: awake and alert Pain management: pain level controlled Vital Signs Assessment: post-procedure vital signs reviewed and stable Respiratory status: spontaneous breathing, nonlabored ventilation, respiratory function stable and patient connected to nasal cannula oxygen Cardiovascular status: stable and blood pressure returned to baseline Postop Assessment: no apparent nausea or vomiting Anesthetic complications: no   No notable events documented.  Last Vitals:  Vitals:   02/24/21 1223 02/24/21 1730  BP: 115/71 107/70  Pulse: (!) 102 (!) 101  Resp: 16 20  Temp: 36.7 C 36.7 C  SpO2: 93% 92%    Last Pain:  Vitals:   02/24/21 1850  TempSrc:   PainSc: [name]

## 2021-02-24 NOTE — Progress Notes (Signed)
Physical Therapy Treatment Patient Details Name: Lori Schmidt MRN: [id number] DOB: Nov 20, 1975 Today's Date: 02/24/2021   History of Present Illness Patient is a 45 y/o female who presents with extensive infection of LLE secondary to MSSA bacteremia now s/p Left BKA 02/19/21. MRI done on 11/12 shows concern for infection in back at L4-5. PMH includes DM, HTN, obesity.    PT Comments    Pt received in bed, requiring encouragement to participate in therapy. Pt fearful of mobility due to back pain. She required mod assist rolling and +2 max assist supine to sit. Attempted lateral scoot transfers without success. Pt internally distracted by pain affecting her ability to motor plan and follow commands. Therapists providing +2 total assist but pt persisting with  heavy posterior lean. Pt returned to bed and repositioned in supine. Pt with decrease in BP upon sitting EOB. Pt encouraged to keep HOB elevated throughout the day.    Recommendations for follow up therapy are one component of a multi-disciplinary discharge planning process, led by the attending physician.  Recommendations may be updated based on patient status, additional functional criteria and insurance authorization.  Follow Up Recommendations  Acute inpatient rehab (3hours/day)     Assistance Recommended at Discharge Frequent or constant Supervision/Assistance  Equipment Recommendations  BSC/3in1;Wheelchair (measurements PT);Wheelchair cushion (measurements PT)    Recommendations for Other Services Rehab consult     Precautions / Restrictions Precautions Precautions: Fall Required Braces or Orthoses: Other Brace Other Brace: limb protector Restrictions Weight Bearing Restrictions: Yes LLE Weight Bearing: Non weight bearing     Mobility  Bed Mobility Overal bed mobility: Needs Assistance Bed Mobility: Rolling;Sidelying to Sit;Sit to Sidelying Rolling: Mod assist Sidelying to sit: +2 for physical assistance;+2  for safety/equipment;Max assist     Sit to sidelying: +2 for safety/equipment;+2 for physical assistance;Max assist General bed mobility comments: cues for logroll. Use of bed pad to scoot to EOB.    Transfers Overall transfer level: Needs assistance Equipment used: 2 person hand held assist Transfers: Bed to chair/wheelchair/BSC            Lateral/Scoot Transfers: Total assist;+2 physical assistance;+2 safety/equipment General transfer comment: attempted lateral scoot transfer bed to recliner toward right. Pt with heavy posterior lean due to back pain. Pt fearful of falling and internally distracted by pain hindering ability to follow commands and assist. Unable to safely complete transfer and required return to bed.    Ambulation/Gait                   Stairs             Wheelchair Mobility    Modified Rankin (Stroke Patients Only)       Balance Overall balance assessment: Needs assistance Sitting-balance support: Bilateral upper extremity supported Sitting balance-Leahy Scale: Poor Sitting balance - Comments: initially max A, progressing to MIn guard with R lateral lean and posterior lean, able to unweight and maintain balance for quick grooming tasks Postural control: Right lateral lean;Posterior lean                                  Cognition Arousal/Alertness: Awake/alert Behavior During Therapy: WFL for tasks assessed/performed;Anxious Overall Cognitive Status: Impaired/Different from baseline Area of Impairment: Awareness;Following commands;Problem solving                       Following Commands: Follows one step commands consistently Safety/Judgement: Decreased  awareness of safety;Decreased awareness of deficits Awareness: Emergent Problem Solving: Decreased initiation;Requires verbal cues;Requires tactile cues General Comments: Pt is cooperative. She gets distracted by pain and then has trouble following commands and  problem solving. She needs to be educated before moving and then have those directions re-iterated        Exercises      General Comments General comments (skin integrity, edema, etc.): Supine: BP 120/78, HR 106, SpO2 91% on RA. Sitting: BP 92/71 (pt with c/o dizziness), HR 113, SpO2 91% on RA      Pertinent Vitals/Pain Pain Assessment: 0-10 Pain Score: 7  Pain Location: back Pain Descriptors / Indicators: Discomfort;Grimacing;Guarding;Moaning;Sore Pain Intervention(s): Monitored during session;Repositioned;Premedicated before session    Home Living                          Prior Function            PT Goals (current goals can now be found in the care plan section) Acute Rehab PT Goals Patient Stated Goal: decrease pain Progress towards PT goals: Progressing toward goals    Frequency    Min 3X/week      PT Plan Current plan remains appropriate    Co-evaluation PT/OT/SLP Co-Evaluation/Treatment: Yes Reason for Co-Treatment: For patient/therapist safety;To address functional/ADL transfers PT goals addressed during session: Mobility/safety with mobility;Balance OT goals addressed during session: ADL's and self-care;Strengthening/ROM      AM-PAC PT "6 Clicks" Mobility   Outcome Measure  Help needed turning from your back to your side while in a flat bed without using bedrails?: A Lot Help needed moving from lying on your back to sitting on the side of a flat bed without using bedrails?: Total Help needed moving to and from a bed to a chair (including a wheelchair)?: Total Help needed standing up from a chair using your arms (e.g., wheelchair or bedside chair)?: Total Help needed to walk in hospital room?: Total Help needed climbing 3-5 steps with a railing? : Total 6 Click Score: 7    End of Session Equipment Utilized During Treatment: Gait belt;Other (comment) (limb protector) Activity Tolerance: Patient limited by pain Patient left: in bed;with  call bell/phone within reach Nurse Communication: Mobility status PT Visit Diagnosis: Other abnormalities of gait and mobility (R26.89);Pain     Time: 0932-3557 PT Time Calculation (min) (ACUTE ONLY): 54 min  Charges:  $Therapeutic Activity: 23-37 mins                     Aida Raider, [place]  Office # [phone] Pager [phone]    Ilda Foil 02/24/2021, 12:56 PM

## 2021-02-24 NOTE — Progress Notes (Signed)
PHARMACY CONSULT NOTE FOR:  OUTPATIENT  PARENTERAL ANTIBIOTIC THERAPY (OPAT)  Indication: MSSA bacteremia/discitis Regimen: cefazolin 2 g IV every 8 hours End date: 04/14/2021  IV antibiotic discharge orders are pended. To discharging provider:  please sign these orders via discharge navigator,  Select New Orders & click on the button choice - Manage This Unsigned Work.     Thank you for allowing pharmacy to be a part of this patient's care.  Alphia Moh, PharmD, BCPS, BCCP Clinical Pharmacist  Please check AMION for all [facility] Pharmacy phone numbers After 10:00 PM, call Main Pharmacy [phone]

## 2021-02-24 NOTE — Progress Notes (Addendum)
Hospital day: 7  Subjective:   Overnight event: No acute events overnight.   Patient seen at bedside during morning rounds. Patient reports continued back pain this AM but states the pain is improving. She is willing to work with PT/OT today. She reports having a BM yesterday and this morning. She was unable to eat much yesterday but finished breakfast this AM. She denies problems with urinary incontinence. The patient asks appropriate questions regarding discharge. We discussed that she will likely be discharged to a rehabilitation facility before going home.   Objective:  Vital signs in last 24 hours: Vitals:   02/23/21 1939 02/23/21 2009 02/24/21 0351 02/24/21 0949  BP: 124/72 127/78 118/69 (!) 106/59  Pulse: (!) 102 (!) 103 96 (!) 106  Resp: _0 Temp: 98.8 F (37.1 C) 98.9 F (37.2 C) 98.8 F (37.1 C) 98.1 F (36.7 C)  TempSrc: Oral Oral Oral Oral  SpO2: 91% 92% 93% 92%  Weight:      Height:        Filed Weights   02/18/21 0118  Weight: 121.2 kg     Intake/Output Summary (Last 24 hours) at 02/24/2021 0751 Last data filed at 02/24/2021 0657 Gross per 24 hour  Intake 120 ml  Output 800 ml  Net -680 ml   Net IO Since Admission: 1,771.39 mL [02/24/21 0751]  Recent Labs    02/23/21 1622 02/23/21 2200 02/24/21 0648  GLUCAP 78 75 80     Pertinent Labs: CBC Latest Ref Rng & Units 02/24/2021 02/23/2021 02/22/2021  WBC 4.0 - 10.5 K/uL 9.1 8.6 9.7  Hemoglobin 12.0 - 15.0 g/dL 9.8(L) 9.5(L) 9.5(L)  Hematocrit 36.0 - 46.0 % 31.6(L) 30.7(L) 29.2(L)  Platelets 150 - 400 K/uL 340 309 284    BMP Latest Ref Rng & Units 02/24/2021 02/23/2021 02/22/2021  Glucose 70 - 99 mg/dL 60(L) 109(H) 85  BUN 6 - 20 mg/dL _1 Creatinine 0.44 - 1.00 mg/dL 0.38(L) 0.37(L) 0.35(L)  Sodium 135 - 145 mmol/L 138 138 137  Potassium 3.5 - 5.1 mmol/L 3.6 4.2 4.2  Chloride 98 - 111 mmol/L 104 104 105  CO2 22 - 32 mmol/L _2 Calcium 8.9 - 10.3 mg/dL 7.5(L) 7.3(L)  7.3(L)   Culture, blood (routine x 2)     Status: None (Preliminary result)   Collection Time: 02/19/21  1:18 AM   Specimen: BLOOD  Result Value Ref Range Status   Specimen Description BLOOD RIGHT ANTECUBITAL  Final   Special Requests   Final    BOTTLES DRAWN AEROBIC AND ANAEROBIC Blood Culture adequate volume   Culture   Final    NO GROWTH 4 DAYS Performed at [facility], [place] [address] [place], [place] [address]    Report Status PENDING  Incomplete  Culture, blood (routine x 2)     Status: None (Preliminary result)   Collection Time: 02/19/21  1:28 AM   Specimen: BLOOD RIGHT HAND  Result Value Ref Range Status   Specimen Description BLOOD RIGHT HAND  Final   Special Requests   Final    BOTTLES DRAWN AEROBIC AND ANAEROBIC Blood Culture adequate volume   Culture   Final    NO GROWTH 4 DAYS Performed at [facility], [place] [address] [place],  [address]    Report Status PENDING  Incomplete    Physical Exam  General: Alert, laying in bed, no acute distress CV: RRR, no murmurs heard  Pulmonary:  Normal WOB on room air, lungs clear anteriorly  Abdominal: Soft, nontender and nondistended  Extremities: L transtibial amputation wrapped with guaze, no drainage seen. Limb protector and prosthetic shrinker present  Skin: Warm, dry  Neuro: Strength 5/5 in R leg but limited by pain. Sensation equal in RLE and LLE. No saddle anesthesia.  Psych: Normal behavior and affect   Assessment/Plan: Lori Schmidt is a 45 y.o. female with hx of DM, HTN, HLD, and depression presenting with [name] complicated by osteomyelitis and MSSA bacteremia now post-op day 5 s/p L transtibial amputation.   Active Problems:   Bacteremia due to Staphylococcus aureus   Diabetic osteomyelitis (HCC)   Left leg cellulitis   Subacute osteomyelitis, left ankle and foot ([place])   Discitis of lumbar region  #MSSA bacteremia  #L4-L5 Discitis  #Left Foot Osteomyelitis  #s/p L  transtibial amputation  Patient continues to be afebrile with no growth on BC for 5 days and WBC wnl. Back pain from discitis appears to be improving. Plan for PICC line placement tomorrow for 8 week course of Ancef. Encouraging patient to partake in therapies as she would benefit from CIR placement.  -ID following, appreciate recs -IV Ancef for 8 weeks -Robaxin 789m po q8h -Oxycodone 554mpo q4h PRN mod pain, 1053mo q4h PRN severe pain -IV Dilaudid q8h PRN severe pain -recheck ESR, CRP Friday   #DM  Recent CBG of 80 and 60. We held AM Levemir and will decrease PM Levemir to 25U due to decreased po intake. Mealtime Novolog 6U when she eats.  -Levemir 25U tonight  -Novolog 6U TID with meals  -SSI, routine CBG monitoring   #HLD  #HTN  BP normotensive, 106/59. Continue to hold Losartan.  -Continue pravastatin 53m84m daily   #Depression  Celexa 53mg65mdaily   Diet: Carb modified   IVF: IV NS 20mL/33mVTE: Lovenox CODE: Full   Prior to Admission Living Arrangement: Home  Anticipated Discharge Location: CIR  Barriers to Discharge: Medical stability  Dispo: Admit to inpatient with anticipated stay > 2 midnights   Signed: BlanksStefani Damacal Student 02/24/2021, 7:51 AM  Internal Medicine Teaching Service [phone] contact the on call pager after 5 pm and on weekends at [phone]

## 2021-02-24 NOTE — Progress Notes (Signed)
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 MRI done on 11/12 shows concern for infection in back at L4-5. PMH includes DM, HTN, obesity.   OT comments  Pt progressing towards OT goals this session. She is willing to work with therapy and acknowledges that current recommendation is for CIR which would mean 3 hours of therapy a day. Pt is motivated (despite pain) and would like to continue to pursue this post-acute venue of care to maximize safety and independence in ADL and functional transfers. Pt is mod A for rolling this session, and able to come sit EOB progressing from max A to min guard. Lateral R lean and posterior lean due to back pain. Pt was able to maintain sitting balance to participate in EOB grooming tasks using R hand. Attempted lateral scoot transfer to drop arm recliner, but discontinued due to recliner sliding and patient anxiety/amount of assist from PT/OT. She will benefit from attempt with [facility] next session and prior to sitting EOB education on process of using stedy so she can be cued/reminded once her back pain kicks in. Pt max A +2 to return supine via log roll. Pt mod A with total A for rear peri care for rolling in the bed for BM. Once supine, took off limb protector and Pt looked at limb for first time as well as education on de-sensitization techniques for phantom limb. Despite pain, Pt is motivated and CIR level therapy is essential to maximize safety and independence in ADL and functional transfers. Next session pre-load recliner with lift pad for RN staff and use stedy for transfer.    Recommendations for follow up therapy are one component of a multi-disciplinary discharge planning process, led by the attending physician.   Recommendations may be updated based on patient status, additional functional criteria and insurance authorization.    Follow Up Recommendations  Acute inpatient rehab (3hours/day)    Assistance Recommended at Discharge Frequent or constant Supervision/Assistance  Equipment Recommendations  BSC/3in1;Wheelchair (measurements OT);Wheelchair cushion (measurements OT)    Recommendations for Other Services Rehab consult    Precautions / Restrictions Precautions Precautions: Fall Required Braces or Orthoses: Other Brace Other Brace: limb protector Restrictions Weight Bearing Restrictions: Yes LLE Weight Bearing: Non weight bearing       Mobility Bed Mobility Overal bed mobility: Needs Assistance Bed Mobility: Rolling;Sidelying to Sit;Sit to Sidelying Rolling: Mod assist Sidelying to sit: Mod assist;+2 for physical assistance;+2 for safety/equipment     Sit to sidelying: Mod assist;+2 for safety/equipment;+2 for physical assistance General bed mobility comments: reinforced log rolling for back pain, mod A to roll, then max A +2 for trunk elevation from side to sitting EOB, bed pad used to bring hips EOB    Transfers Overall transfer level: Needs assistance Equipment used: 2 person hand held assist Transfers: Bed to chair/wheelchair/BSC            Lateral/Scoot Transfers: Total assist;+2 physical assistance;+2 safety/equipment General transfer comment: attempted to the Right, Pt internally distracted by back pain and with posterior lean, trouble following commands, and fear. Therapy team was unsuccessful in transfer today. Recommend attempt with stedy next session     Balance Overall balance assessment: Needs assistance Sitting-balance support: Bilateral upper extremity supported Sitting balance-Leahy Scale: Poor Sitting balance - Comments: initially max A,  progressing to MIn guard with R lateral lean and posterior lean, able to unweight and maintain balance for quick  grooming tasks Postural control: Right lateral lean;Posterior lean                                 ADL either performed or assessed with clinical judgement   ADL Overall ADL's : Needs assistance/impaired     Grooming: Wash/dry face;Brushing hair;Set up;Sitting Grooming Details (indicate cue type and reason): lateral lean to right, able to off weight her R arm to use for grooming tasks Upper Body Bathing: Minimal assistance Upper Body Bathing Details (indicate cue type and reason): for back sitting EOB         Lower Body Dressing: Maximal assistance;Bed level Lower Body Dressing Details (indicate cue type and reason): reposition limb guard Toilet Transfer: Maximal assistance;+2 for safety/equipment;+2 for physical assistance;Requires drop arm;Requires wide/bariatric Toilet Transfer Details (indicate cue type and reason): failed attempt at lateral transfer today - she will need to continue to use bed pain at this time Toileting- Clothing Manipulation and Hygiene: Maximal assistance;+2 for safety/equipment;Bed level Toileting - Clothing Manipulation Details (indicate cue type and reason): rolling for peri care     Functional mobility during ADLs: Maximal assistance;Total assistance;+2 for physical assistance;+2 for safety/equipment (attempted lateral transfer) General ADL Comments: Pt much better cognitively, but get internally distracted by pain which impacts her ability to perform transfers essential to ADL- Pt DOES put forth good effort and is motivated    Extremity/Trunk Assessment Upper Extremity Assessment Upper Extremity Assessment: LUE deficits/detail LUE Deficits / Details: sensitive to touch, however able to push and pull with shoulder without indication of pain LUE Sensation:  (extra sensitive) LUE Coordination: WNL (full shoulder FF, abduction)            Vision       Perception     Praxis      Cognition Arousal/Alertness: Awake/alert Behavior  During Therapy: WFL for tasks assessed/performed;Anxious Overall Cognitive Status: Impaired/Different from baseline Area of Impairment: Awareness;Following commands;Problem solving                       Following Commands: Follows one step commands consistently   Awareness: Emergent Problem Solving: Decreased initiation;Requires verbal cues;Requires tactile cues General Comments: Pt is cooperative. She gets distracted by pain and then has trouble following commands and problem solving. 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 [employer]    Pertinent Vitals/ Pain       Pain Assessment: 0-10 Pain Score: 7  Pain Location: back Pain Descriptors / Indicators: Discomfort;Grimacing;Guarding;Moaning;Sore Pain Intervention(s): Monitored during session;Repositioned;Premedicated before session  Home Living                                          Prior Functioning/Environment              Frequency  Min 2X/week        Progress Toward Goals  OT Goals(current goals can now be found in the care plan section)  Progress towards OT goals: Progressing toward goals  Acute Rehab OT  Goals Patient Stated Goal: get back to work at [employer] OT Goal Formulation: With patient Time For Goal Achievement: 03/07/21 Potential to Achieve Goals: Good  Plan Discharge plan remains appropriate;Frequency remains appropriate    Co-evaluation    PT/OT/SLP Co-Evaluation/Treatment: Yes Reason for Co-Treatment: Other (comment);To address functional/ADL transfers;For patient/therapist safety PT goals addressed during session: Mobility/safety with mobility;Balance;Strengthening/ROM OT goals addressed during session: ADL's and self-care;Strengthening/ROM      AM-PAC OT "6 Clicks" Daily Activity     Outcome Measure   Help  from another person eating meals?: A Little Help from another person taking care of personal grooming?: A Little Help from another person toileting, which includes using toliet, bedpan, or urinal?: A Lot Help from another person bathing (including washing, rinsing, drying)?: A Lot Help from another person to put on and taking off regular upper body clothing?: A Lot Help from another person to put on and taking off regular lower body clothing?: A Lot 6 Click Score: 14    End of Session Equipment Utilized During Treatment: Gait belt  OT Visit Diagnosis: Other abnormalities of gait and mobility (R26.89);Other symptoms and signs involving cognitive function;Pain Pain - Right/Left: Left Pain - part of body: Leg (and back)   Activity Tolerance Patient tolerated treatment well   Patient Left in bed;with call bell/phone within reach;with bed alarm set   Nurse Communication Mobility status;Need for lift equipment;Precautions        Time: 7915-0569 OT Time Calculation (min): 52 min  Charges: OT General Charges $OT Visit: 1 Visit OT Treatments $Self Care/Home Management : 8-22 mins $Therapeutic Activity: 8-22 mins  Nyoka Cowden OTR/L Acute Rehabilitation Services Pager: [phone] Office: [phone]  Evern Bio Salia Cangemi 02/24/2021, 10:18 AM

## 2021-02-25 ENCOUNTER — Inpatient Hospital Stay: Payer: Self-pay

## 2021-02-25 ENCOUNTER — Encounter (HOSPITAL_COMMUNITY): Payer: Self-pay | Admitting: Internal Medicine

## 2021-02-25 DIAGNOSIS — M4646 Discitis, unspecified, lumbar region: Secondary | ICD-10-CM | POA: Diagnosis not present

## 2021-02-25 DIAGNOSIS — E1169 Type 2 diabetes mellitus with other specified complication: Secondary | ICD-10-CM | POA: Diagnosis not present

## 2021-02-25 DIAGNOSIS — R7881 Bacteremia: Secondary | ICD-10-CM | POA: Diagnosis not present

## 2021-02-25 DIAGNOSIS — B9561 Methicillin susceptible Staphylococcus aureus infection as the cause of diseases classified elsewhere: Secondary | ICD-10-CM | POA: Diagnosis not present

## 2021-02-25 LAB — BASIC METABOLIC PANEL
Anion gap: 9 (ref 5–15)
BUN: 14 mg/dL (ref 6–20)
CO2: 25 mmol/L (ref 22–32)
Calcium: 7.1 mg/dL — ABNORMAL LOW (ref 8.9–10.3)
Chloride: 101 mmol/L (ref 98–111)
Creatinine, Ser: 0.4 mg/dL — ABNORMAL LOW (ref 0.44–1.00)
GFR, Estimated: >60 mL/min (ref >60)
Glucose, Bld: 234 mg/dL — ABNORMAL HIGH (ref 70–99)
Potassium: 3.8 mmol/L (ref 3.5–5.1)
Sodium: 135 mmol/L (ref 135–145)

## 2021-02-25 LAB — CBC
HCT: 28.4 % — ABNORMAL LOW (ref 36.0–46.0)
Hemoglobin: 8.8 g/dL — ABNORMAL LOW (ref 12.0–15.0)
MCH: 27.9 pg (ref 26.0–34.0)
MCHC: 31 g/dL (ref 30.0–36.0)
MCV: 90.2 fL (ref 80.0–100.0)
Platelets: 277 10*3/uL (ref 150–400)
RBC: 3.15 MIL/uL — ABNORMAL LOW (ref 3.87–5.11)
RDW: 13.4 % (ref 11.5–15.5)
WBC: 5.9 10*3/uL (ref 4.0–10.5)
nRBC: 0 % (ref 0.0–0.2)

## 2021-02-25 LAB — GLUCOSE, CAPILLARY
Glucose-Capillary: 167 mg/dL — ABNORMAL HIGH (ref 70–99)
Glucose-Capillary: 154 mg/dL — ABNORMAL HIGH (ref 70–99)
Glucose-Capillary: 157 mg/dL — ABNORMAL HIGH (ref 70–99)
Glucose-Capillary: 202 mg/dL — ABNORMAL HIGH (ref 70–99)

## 2021-02-25 MED ORDER — INSULIN DETEMIR 100 UNIT/ML ~~LOC~~ SOLN
50.0000 [IU] | Freq: Every day | SUBCUTANEOUS | Status: DC
  Administered 2021-02-25: 22:00:00 50 [IU] via SUBCUTANEOUS
  Filled 2021-02-25 (×2): qty 0.5

## 2021-02-25 MED ORDER — HYDROMORPHONE HCL 1 MG/ML IJ SOLN
0.5000 mg | Freq: Two times a day (BID) | INTRAMUSCULAR | Status: DC | PRN
  Administered 2021-02-25: 19:00:00 0.5 mg via INTRAVENOUS
  Filled 2021-02-25: qty 0.5

## 2021-02-25 MED ORDER — SODIUM CHLORIDE 0.9% FLUSH
10.0000 mL | INTRAVENOUS | Status: DC | PRN
  Administered 2021-03-03: 10 mL

## 2021-02-25 MED ORDER — HYDROMORPHONE HCL 1 MG/ML IJ SOLN
0.5000 mg | Freq: Two times a day (BID) | INTRAMUSCULAR | Status: DC | PRN
  Administered 2021-02-25: 09:00:00 1 mg via INTRAVENOUS
  Filled 2021-02-25: qty 1

## 2021-02-25 MED ORDER — SODIUM CHLORIDE 0.9% FLUSH
10.0000 mL | Freq: Two times a day (BID) | INTRAVENOUS | Status: DC
  Administered 2021-02-25 – 2021-03-05 (×13): 10 mL

## 2021-02-25 MED ORDER — CHLORHEXIDINE GLUCONATE CLOTH 2 % EX PADS
6.0000 | MEDICATED_PAD | Freq: Every day | CUTANEOUS | Status: DC
  Administered 2021-02-25: 12:00:00 6 via TOPICAL

## 2021-02-25 MED ORDER — METHOCARBAMOL 500 MG PO TABS
1000.0000 mg | ORAL_TABLET | Freq: Three times a day (TID) | ORAL | Status: DC
  Administered 2021-02-25 – 2021-02-28 (×9): 1000 mg via ORAL
  Filled 2021-02-25 (×9): qty 2

## 2021-02-25 NOTE — Progress Notes (Addendum)
Hospital day: 8  Subjective:  Lori Schmidt is a 45 y.o. female with a history of DM, HTN, HLD, and depression presenting with LLE osteomyelitis s/p L transtibial amputation, MSSA bacteremia, and discitis.   Overnight event: No acute events overnight.   Patient seen at bedside during morning rounds. She reports that she continues to have spasm-like pain in her back unchanged from yesterday. She was educated on how to use her incentive spirometer. She was encouraged to participate in PT/OT and continues to display motivation for therapies. She had another BM last night. She denies urinary incontinence, new weakness, or numbness.  Objective:  Vital signs in last 24 hours: Vitals:   02/24/21 1730 02/24/21 2144 02/25/21 0542 02/25/21 0756  BP: 107/70 100/64 116/72 116/77  Pulse: (!) 101 100 93 96  Resp: _0 Temp: 98.1 F (36.7 C) 98 F (36.7 C) 98.6 F (37 C) 98.2 F (36.8 C)  TempSrc: Oral Oral Oral   SpO2: 92% 94% 93% 92%  Weight:      Height:        Filed Weights   02/18/21 0118  Weight: 121.2 kg     Intake/Output Summary (Last 24 hours) at 02/25/2021 1239 Last data filed at 02/25/2021 1001 Gross per 24 hour  Intake 700 ml  Output 500 ml  Net 200 ml   Net IO Since Admission: 2,871.39 mL [02/25/21 1239]  Recent Labs    02/24/21 2107 02/25/21 0759 02/25/21 1158  GLUCAP 180* 167* 154*     Pertinent Labs: CBC Latest Ref Rng & Units 02/25/2021 02/24/2021 02/23/2021  WBC 4.0 - 10.5 K/uL 5.9 9.1 8.6  Hemoglobin 12.0 - 15.0 g/dL 8.8(L) 9.8(L) 9.5(L)  Hematocrit 36.0 - 46.0 % 28.4(L) 31.6(L) 30.7(L)  Platelets 150 - 400 K/uL 277 340 309   BMP Latest Ref Rng & Units 02/25/2021 02/24/2021 02/23/2021  Glucose 70 - 99 mg/dL 234(H) 60(L) 109(H)  BUN 6 - 20 mg/dL _1 Creatinine 0.44 - 1.00 mg/dL 0.40(L) 0.38(L) 0.37(L)  Sodium 135 - 145 mmol/L 135 138 138  Potassium 3.5 - 5.1 mmol/L 3.8 3.6 4.2  Chloride 98 - 111 mmol/L 101 104 104  CO2 22 - 32  mmol/L _2 Calcium 8.9 - 10.3 mg/dL 7.1(L) 7.5(L) 7.3(L)   Culture, blood (routine x 2)     Status: None   Collection Time: 02/19/21  1:18 AM   Specimen: BLOOD  Result Value Ref Range Status   Specimen Description BLOOD RIGHT ANTECUBITAL  Final   Special Requests   Final    BOTTLES DRAWN AEROBIC AND ANAEROBIC Blood Culture adequate volume   Culture   Final    NO GROWTH 5 DAYS Performed at [facility], [address], [place], [place] [address]    Report Status 02/24/2021 FINAL  Final  Culture, blood (routine x 2)     Status: None   Collection Time: 02/19/21  1:28 AM   Specimen: BLOOD RIGHT HAND  Result Value Ref Range Status   Specimen Description BLOOD RIGHT HAND  Final   Special Requests   Final    BOTTLES DRAWN AEROBIC AND ANAEROBIC Blood Culture adequate volume   Culture   Final    NO GROWTH 5 DAYS Performed at [facility], [place] [address], [place], [place] [address]    Report Status 02/24/2021 FINAL  Final   Physical Exam  General: Alert, sitting up in bed, NAD, winces on deep breathes  with spirometer  CV: RRR, no murmurs heard  Pulmonary: Normal WOB, crackles present bilaterally  Abdominal: Soft, nondistended, nontender, normal bowel sounds Extremities: L transtibial amputation site with limb protector and prosthetic shrinker Skin: Warm, dry  Neuro: A&Ox3  Psych: Appropriate behavior and affect    Assessment/Plan: Bora Bost is a 45 y.o. female with hx of DM, HTN, HLD, and depression presenting with LLE osteomyelitis s/p L transtibial amputation, MSSA bacteremia, and discits.   Active Problems:   Bacteremia due to Staphylococcus aureus   Diabetes mellitus ([place])   Left leg cellulitis   Subacute osteomyelitis, left ankle and foot ([place])   Discitis of lumbar region  #LLE osteomyelitis  #s/p L transtibial amputation  #MSSA bacteremia  #L4-L5 Discitis / osteomyelitis  Patient is post-op day 6 s/p L transtibial amputation 2/2  osteomyelitis. Her amputation site appears to be healing well with little to no pain, but she continues to experience discomfort in her back due to discitis/osteomyelitis of lumbar spine. No growth on blood cultures >5 days. Will increase dose of muscle relaxer. Discussed that we will wean IV pain meds in favor of oral meds in anticipation of discharge. She had a PICC line placed earlier today and will continue on the 8-week course of Ancef. PT/OT have recommended CIR placement at this time, appreciate their continued assistance.  -Wean IV Dilaudid from 1m q8h to 0.59mq12h -Continue Oxycodone po PRN pain -Increase robaxin to 100067m8h -IV Ancef, PICC line placed  -PT/OT following -ESR, CRP tomorrow  #DM  Recent CBGs of 154, 167, 234. Patient is now eating normally, will restart Levemir 50U qhs.  -Levemir 50U qhs  -Novolog 6U TID with meals -CBG with meals, SSI   #HLD  #HTN BP continues to be normotensive, 116/77. Continue to hold Losartan.  -Continue pravastatin 29m29m daily   #Depression  Continue Celexa 29mg41mdaily   Diet: Carb modified  IVF: None VTE: Lovenox  CODE: Full  Prior to Admission Living Arrangement: Home Anticipated Discharge Location: CIR  Barriers to Discharge: Medical stability  Dispo: Anticipated discharge in 1-2 days   Signed: BlankStefani Damaical Student 02/25/2021, 12:39 PM  Internal Medicine Teaching Service [phone] contact the on call pager after 5 pm and on weekends at [phone]

## 2021-02-25 NOTE — Progress Notes (Signed)
Peripherally Inserted Central Catheter Placement  The IV Nurse has discussed with the patient and/or persons authorized to consent for the patient, the purpose of this procedure and the potential benefits and risks involved with this procedure.  The benefits include less needle sticks, lab draws from the catheter, and the patient may be discharged home with the catheter. Risks include, but not limited to, infection, bleeding, blood clot (thrombus formation), and puncture of an artery; nerve damage and irregular heartbeat and possibility to perform a PICC exchange if needed/ordered by physician.  Alternatives to this procedure were also discussed.  Bard Power PICC patient education guide, fact sheet on infection prevention and patient information card has been provided to patient /or left at bedside.    PICC Placement Documentation  PICC Single Lumen 02/25/21 Right Brachial 39 cm 0 cm (Active)  Indication for Insertion or Continuance of Line Home intravenous therapies (PICC only) 02/25/21 0900  Exposed Catheter (cm) 0 cm 02/25/21 0900  Site Assessment Clean;Dry;Intact 02/25/21 0900  Line Status Flushed;Saline locked;Blood return noted 02/25/21 0900  Dressing Type Transparent;Securing device 02/25/21 0900  Dressing Status Clean;Dry;Intact 02/25/21 0900  Antimicrobial disc in place? Yes 02/25/21 0900  Safety Lock Not Applicable 02/25/21 0900  Line Care Connections checked and tightened 02/25/21 0900  Dressing Intervention New dressing 02/25/21 0900  Dressing Change Due 03/04/21 02/25/21 0900       Franne Grip Renee 02/25/2021, 9:45 AM

## 2021-02-25 NOTE — Progress Notes (Signed)
Inpatient Rehabilitation Admissions Coordinator   Patient not at a level to pursue CIR level rehab. Recommend other rehab venues to be pursued. TOC aware.  Ottie Glazier, RN, MSN Rehab Admissions Coordinator [phone] 02/25/2021 4:14 PM

## 2021-02-26 ENCOUNTER — Other Ambulatory Visit (HOSPITAL_COMMUNITY): Payer: Self-pay

## 2021-02-26 DIAGNOSIS — B9561 Methicillin susceptible Staphylococcus aureus infection as the cause of diseases classified elsewhere: Secondary | ICD-10-CM | POA: Diagnosis not present

## 2021-02-26 DIAGNOSIS — R7881 Bacteremia: Secondary | ICD-10-CM | POA: Diagnosis not present

## 2021-02-26 DIAGNOSIS — M4646 Discitis, unspecified, lumbar region: Secondary | ICD-10-CM | POA: Diagnosis not present

## 2021-02-26 DIAGNOSIS — M86272 Subacute osteomyelitis, left ankle and foot: Secondary | ICD-10-CM | POA: Diagnosis not present

## 2021-02-26 LAB — BASIC METABOLIC PANEL
Anion gap: 9 (ref 5–15)
BUN: 11 mg/dL (ref 6–20)
CO2: 23 mmol/L (ref 22–32)
Calcium: 7.8 mg/dL — ABNORMAL LOW (ref 8.9–10.3)
Chloride: 105 mmol/L (ref 98–111)
Creatinine, Ser: 0.44 mg/dL (ref 0.44–1.00)
GFR, Estimated: >60 mL/min (ref >60)
Glucose, Bld: 243 mg/dL — ABNORMAL HIGH (ref 70–99)
Potassium: 4.2 mmol/L (ref 3.5–5.1)
Sodium: 137 mmol/L (ref 135–145)

## 2021-02-26 LAB — C-REACTIVE PROTEIN: CRP: 7.5 mg/dL — ABNORMAL HIGH (ref <1.0)

## 2021-02-26 LAB — CBC
HCT: 27.6 % — ABNORMAL LOW (ref 36.0–46.0)
Hemoglobin: 8.4 g/dL — ABNORMAL LOW (ref 12.0–15.0)
MCH: 28.1 pg (ref 26.0–34.0)
MCHC: 30.4 g/dL (ref 30.0–36.0)
MCV: 92.3 fL (ref 80.0–100.0)
Platelets: 305 10*3/uL (ref 150–400)
RBC: 2.99 MIL/uL — ABNORMAL LOW (ref 3.87–5.11)
RDW: 13.2 % (ref 11.5–15.5)
WBC: 5.6 10*3/uL (ref 4.0–10.5)
nRBC: 0 % (ref 0.0–0.2)

## 2021-02-26 LAB — GLUCOSE, CAPILLARY
Glucose-Capillary: 166 mg/dL — ABNORMAL HIGH (ref 70–99)
Glucose-Capillary: 112 mg/dL — ABNORMAL HIGH (ref 70–99)
Glucose-Capillary: 114 mg/dL — ABNORMAL HIGH (ref 70–99)
Glucose-Capillary: 169 mg/dL — ABNORMAL HIGH (ref 70–99)

## 2021-02-26 LAB — SEDIMENTATION RATE: Sed Rate: 67 mm/hr — ABNORMAL HIGH (ref 0–22)

## 2021-02-26 MED ORDER — CHLORHEXIDINE GLUCONATE CLOTH 2 % EX PADS
6.0000 | MEDICATED_PAD | Freq: Every day | CUTANEOUS | Status: DC
  Administered 2021-02-26 – 2021-03-04 (×7): 6 via TOPICAL

## 2021-02-26 MED ORDER — INSULIN DETEMIR 100 UNIT/ML ~~LOC~~ SOLN
55.0000 [IU] | Freq: Every day | SUBCUTANEOUS | Status: DC
  Administered 2021-02-26 – 2021-03-04 (×7): 55 [IU] via SUBCUTANEOUS
  Filled 2021-02-26 (×10): qty 0.55

## 2021-02-26 MED ORDER — METFORMIN HCL 500 MG PO TABS
500.0000 mg | ORAL_TABLET | Freq: Every day | ORAL | Status: DC
  Administered 2021-02-26 – 2021-03-04 (×7): 500 mg via ORAL
  Filled 2021-02-26 (×7): qty 1

## 2021-02-26 MED ORDER — METFORMIN HCL 500 MG PO TABS
1000.0000 mg | ORAL_TABLET | Freq: Every day | ORAL | Status: DC
Start: 2021-02-27 — End: 2021-02-26

## 2021-02-26 MED ORDER — LINAGLIPTIN 5 MG PO TABS
5.0000 mg | ORAL_TABLET | Freq: Every day | ORAL | Status: DC
  Administered 2021-02-26 – 2021-03-05 (×8): 5 mg via ORAL
  Filled 2021-02-26 (×8): qty 1

## 2021-02-26 MED ORDER — INSULIN ASPART 100 UNIT/ML IJ SOLN
8.0000 [IU] | Freq: Three times a day (TID) | INTRAMUSCULAR | Status: DC
  Administered 2021-02-26 – 2021-03-05 (×15): 8 [IU] via SUBCUTANEOUS

## 2021-02-26 NOTE — TOC Benefit Eligibility Note (Signed)
Patient Product/process development scientist completed.    The patient is currently admitted and upon discharge could be taking Truilcity 0.75 mg.  Requires Prior Authorization  The patient is currently admitted and upon discharge could be taking Ozempic 0.25 mg.  Requires Prior Authorization  The patient is currently admitted and upon discharge could be taking Rybelsus 3 mg tablets  Requires Prior Authorization  The patient is insured through [employer]    Roland Earl, CPhT Pharmacy Patient Advocate Specialist [facility] Patient Advocate Team Direct Number: [phone]  Fax: [phone]

## 2021-02-26 NOTE — Progress Notes (Signed)
Inpatient Rehabilitation Admissions Coordinator   Patient continues to make progress, but not yet at a level that I will pursue CIR level rehab. TOC and Attending service is aware. I will follow her progress.  Ottie Glazier, RN, MSN Rehab Admissions Coordinator [phone] 02/26/2021 11:02 AM

## 2021-02-26 NOTE — Progress Notes (Addendum)
Hospital day: 9  Subjective:  Lori Schmidt is 45 y.o. female with a history of DM, HTN, HLD, and depression who presented with LLE osteomyelitis POD 7 s/p L transtibial amputation, MSSA bacteremia, and discitis.   Overnight event: No acute events.   Patient seen at bedside during morning rounds. She states that she is feeling much better today with little back pain and no leg pain, and that the muscle relaxer helps. She endorses good appetite and normal BM. No other complaints.   Objective:  Vital signs in last 24 hours: Vitals:   02/25/21 1633 02/25/21 2118 02/26/21 0534 02/26/21 0801  BP: (!) 155/91 119/72 112/66 95/76  Pulse: (!) 102 100 89 87  Resp: 20 18 18 17   Temp: 98.2 F (36.8 C) 98.2 F (36.8 C) 97.9 F (36.6 C) 98 F (36.7 C)  TempSrc:  Oral Oral Oral  SpO2: 97% 94% 96% 94%  Weight:      Height:        Filed Weights   02/18/21 0118  Weight: 121.2 kg     Intake/Output Summary (Last 24 hours) at 02/26/2021 1016 Last data filed at 02/26/2021 0618 Gross per 24 hour  Intake 1131 ml  Output 1700 ml  Net -569 ml   Net IO Since Admission: 2,302.39 mL [02/26/21 1016]  Recent Labs    02/25/21 1638 02/25/21 2124 02/26/21 0752  GLUCAP 157* 202* 166*     Pertinent Labs: CBC Latest Ref Rng & Units 02/26/2021 02/25/2021 02/24/2021  WBC 4.0 - 10.5 K/uL 5.6 5.9 9.1  Hemoglobin 12.0 - 15.0 g/dL 02/26/2021) 8.5(I) 7.7(O)  Hematocrit 36.0 - 46.0 % 27.6(L) 28.4(L) 31.6(L)  Platelets 150 - 400 K/uL 305 277 340   BMP Latest Ref Rng & Units 02/26/2021 02/25/2021 02/24/2021  Glucose 70 - 99 mg/dL 02/26/2021) 353(I) 144(R)  BUN 6 - 20 mg/dL 11 14 9   Creatinine 0.44 - 1.00 mg/dL 15(Q ) 0.08)  Sodium 135 - 145 mmol/L 137 135 138  Potassium 3.5 - 5.1 mmol/L 4.2 3.8 3.6  Chloride 98 - 111 mmol/L 105 101 104  CO2 22 - 32 mmol/L 23 25 24   Calcium 8.9 - 10.3 mg/dL 7.8(L) 7.1(L) 7.5(L)   Blood Culture    Component Value Date/Time   SDES BLOOD RIGHT HAND 02/19/2021  0128   SPECREQUEST  02/19/2021 0128    BOTTLES DRAWN AEROBIC AND ANAEROBIC Blood Culture adequate volume   CULT  02/19/2021 0128    NO GROWTH 5 DAYS Performed at [facility], [address] [place], [facility] [address]    REPTSTATUS 02/24/2021 FINAL 02/19/2021 0128   C-Reactive Protein     Component Value Date/Time   CRP 7.5 (H) 02/26/2021 0134   Erythrocyte Sedimentation Rate     Component Value Date/Time   ESRSEDRATE 67 (H) 02/26/2021 0134     Physical Exam  General: Alert, sitting up in bed, NAD  CV: RRR, no murmurs heard  Pulmonary: Normal WOB on room air, crackles present consistent with atelectasis  Abdominal: Soft, non-tender and non-distended. Normal bowel sounds Extremities: L transtibial amputation with limb protector and prosthetic shrinker. No edema  Skin: Warm, dry  Neuro: A&Ox3  Psych: Normal behavior and affect   Assessment/Plan: Lori Schmidt is a 45 y.o. female with hx of HTN, DM, HLD, and depression presenting with [name] complicated by osteomyelitis, POD 7 s/p L transtibial amputation, MSSA bacteremia, and discitis.    Active Problems:   Bacteremia due to Staphylococcus aureus  Diabetes mellitus ([place])   Left leg cellulitis   Subacute osteomyelitis, left ankle and foot (HCC)   Discitis of lumbar region  #LLE Osteomyelitis s/p L transtibial amputation  #L4-L5 Discitis  #MSSA bacteremia  Patient is afebrile, WBC wnl, inflammatory markers trending down, and no growth on blood cultures >6 days. Patient's back pain from lumbar discitis/osteomyelitis appears significantly improved today. Will plan to discontinue IV Dilaudid. Encouraged her to continue with PT/OT. At this point is medically stable for discharge pending placement. Spoke to [place], [employer], who states that patient will not be accepted into CIR here. PT/OT continue to recommend CIR, discussed with case manager and will pursue CIR at [facility], as patient's  age and insurance make SNF acceptance unlikely. She will continue to receive IV antibiotics for the discitis with end of treatment on 04/14/2021.  -Stop IV Dilaudid  -Continue Oxycodone po PRN pain  -Continue Robaxin 1000mg  po q8h  -PT/OT following  -Placement pending   #DM  Recent CBGs 157-202. Patient requiring additional 4U SSI TID with mealtimes. Will increase basal Levemir from 50U to 55U qhs and mealtime Novolog from 6U to 8U TID. Restart home sitagliptin 100mg  po daily and metformin 500mg  po daily. Will consider switching sitagliptin to GLP-1 agonist on discharge.  -Levemir 55U qhs -Novolog 8U TID with meals  -Routine CBG monitoring, SSI  -Restart home sitagliptin 100mg  (hospital formulary equivalent is linagliptin 5mg ) and metformin 500mg  po daily  -Consider switching sitagliptin to GLP-1 on discharge   #HLD #HTN BP normotensive, 112/66. Continue to hold ARB.  -Continue Pravastatin 40mg  po daily   #Depression  Continue Celexa 40mg  po daily  Diet: Carb modified  IVF: None  VTE: Lovenox CODE: Full   Prior to Admission Living Arrangement: Home Anticipated Discharge Location: CIR vs SNF  Barriers to Discharge: Placement pending  Dispo: Anticipated discharge in 1-2 days  Signed: Stefani Dama, Medical Student 02/26/2021, 10:16 AM  Internal Medicine Teaching Service [phone] Please contact the on call pager after 5 pm and on weekends at [phone].

## 2021-02-26 NOTE — Care Management (Signed)
Spoke to patient at bedside. Patient aware she was not accepted at [facility].    Discussed referral to [facility]. Patient does not want to go to [facility].    Patient from home with 45 year old daughter. Patient would like to start process of SNF for short term rehab

## 2021-02-26 NOTE — Progress Notes (Signed)
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 MRI done on 11/12 shows concern for infection in back at L4-5. PMH includes DM, HTN, obesity.   OT comments  Patient received in bed and tech had recently completed self care. Patient was co-treat with PT to address transfers.  Patient demonstrated improvement on getting to EOB with education on technique. Patient declined performing grooming seated on EOB stating she had already completed task. Patient was max assist x 2 to lateral scoot transfer  to recliner using pads to assist.  Patient stated comfort and relief once in chair. Patient has potential to make further progress with OT treatment.    Recommendations for follow up therapy are one component of a multi-disciplinary discharge planning process, led by the attending physician.  Recommendations may be updated based on patient status, additional functional criteria and insurance authorization.    Follow Up Recommendations  Acute inpatient rehab (3hours/day)    Assistance Recommended at Discharge Frequent or constant Supervision/Assistance  Equipment Recommendations  BSC/3in1;Wheelchair (measurements OT);Wheelchair cushion (measurements OT)    Recommendations for Other Services Rehab consult    Precautions / Restrictions Precautions Precautions: Fall Required Braces or Orthoses: Other Brace Other Brace: limb protector Restrictions Weight Bearing Restrictions: Yes LLE Weight Bearing: Non weight bearing       Mobility Bed Mobility Overal bed mobility: Needs Assistance Bed Mobility: Rolling;Sidelying to Sit Rolling: Supervision Sidelying to sit: Min guard       General bed mobility comments: Education on rolling to get to side and  assistance to get to EOB with pad    Transfers Overall transfer level: Needs assistance Equipment used: 2 person hand held assist Transfers: Bed to chair/wheelchair/BSC            Lateral/Scoot Transfers: Max assist;+2 physical assistance General transfer comment: PT/OT attempt to encourage pt to trial standing, discuss safety with hand and foot placement, however pt. is reluctant/fearful to try activity secondary to inc back pain.  Pt. is more willing to trial later scoot transfer to chair.  Pt. requires 2 person max A to transfer over to chair with pt, PT and OT working together with use of chuck pad to scoot a little at a time into chair.  Pt. is able to assist with B UEs and R LE.  VCs required to maintain NWB on L residual limb.     Balance Overall balance assessment: Needs assistance Sitting-balance support: Bilateral upper extremity supported Sitting balance-Leahy Scale: Fair Sitting balance - Comments: posterior leaning due to back pain                                   ADL either performed or assessed with clinical judgement   ADL                                              Extremity/Trunk Assessment Upper Extremity Assessment LUE Deficits / Details: sensitive to touch, however able to push and pull with shoulder without indication of pain LUE Coordination: WNL            Vision  Perception     Praxis      Cognition Arousal/Alertness: Awake/alert Behavior During Therapy: WFL for tasks assessed/performed Overall Cognitive Status: Within Functional Limits for tasks assessed                                 General Comments: Pt. is supine in bed when PT/OT arrives.  Agreeable to trial transfer to chair. Family present in room.          Exercises     Shoulder Instructions       General Comments      Pertinent Vitals/ Pain       Pain Assessment: 0-10 Pain Score: 5  Pain Location: States [name]  pain is under control but c/o low back pain with movement. Pain Descriptors / Indicators: Aching;Discomfort Pain Intervention(s): Limited activity within patient's tolerance;Monitored during session;Premedicated before session;Repositioned  Home Living                                          Prior Functioning/Environment              Frequency  Min 2X/week        Progress Toward Goals  OT Goals(current goals can now be found in the care plan section)  Progress towards OT goals: Progressing toward goals  Acute Rehab OT Goals Patient Stated Goal: get into chair OT Goal Formulation: With patient Time For Goal Achievement: 03/07/21 Potential to Achieve Goals: Good ADL Goals Pt Will Perform Grooming: with supervision;sitting Pt Will Perform Upper Body Dressing: with set-up;sitting Pt Will Perform Lower Body Dressing: with min assist;with caregiver independent in assisting;sitting/lateral leans Pt Will Transfer to Toilet: with mod assist;squat pivot transfer;bedside commode Pt Will Perform Toileting - Clothing Manipulation and hygiene: with min guard assist;sitting/lateral leans Additional ADL Goal #1: Pt will maintain seated balance EOB for participation in ADL at supervision level  Plan Discharge plan remains appropriate;Frequency remains appropriate    Co-evaluation    PT/OT/SLP Co-Evaluation/Treatment: Yes Reason for Co-Treatment: To address functional/ADL transfers;For patient/therapist safety PT goals addressed during session: Mobility/safety with mobility OT goals addressed during session: Other (comment) (functional transfers)      AM-PAC OT "6 Clicks" Daily Activity     Outcome Measure   Help from another person eating meals?: A Little Help from another person taking care of personal grooming?: A Little Help from another person toileting, which includes using toliet, bedpan, or urinal?: A Lot Help from another person bathing (including  washing, rinsing, drying)?: A Lot Help from another person to put on and taking off regular upper body clothing?: A Lot Help from another person to put on and taking off regular lower body clothing?: A Lot 6 Click Score: 14    End of Session Equipment Utilized During Treatment: Gait belt  OT Visit Diagnosis: Other abnormalities of gait and mobility (R26.89);Other symptoms and signs involving cognitive function;Pain Pain - Right/Left: Left Pain - part of body: Leg   Activity Tolerance Patient tolerated treatment well   Patient Left in chair;with call bell/phone within reach;with chair alarm set;with nursing/sitter in room   Nurse Communication Mobility status;Need for lift equipment        Time: 0093-8182 OT Time Calculation (min): 15 min  Charges: OT General Charges $OT Visit: 1 Visit OT Treatments $Therapeutic Activity: 8-22 mins  Alfonse Flavors, OTA Acute Rehabilitation Services  Pager [phone] Office [phone]   Dewain Penning 02/26/2021, 10:41 AM

## 2021-02-26 NOTE — Progress Notes (Signed)
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 MRI done on 11/12 shows concern for infection in back at L4-5. PMH includes DM, HTN, obesity.    PT Comments    Pt demos improved strength and activity tolerance and is able to complete bed mobility with CGA and perform lateral scoot to chair with max A x 2 from PT/OT.  Pt remains fearful to attempt standing with primarily limitation being c/o back pain.  PT to continue to follow acutely to encourage pt to trail standing and initiate LE therex.  Pt would benefit from intense therapy at CIR prior to return home.  Recommendations for follow up therapy are one component of a multi-disciplinary discharge planning process, led by the attending physician.  Recommendations may be updated based on patient status, additional functional criteria and insurance authorization.  Follow Up Recommendations  Acute inpatient rehab (3hours/day) ([facility] previously declined but hoping they will reconsider as pt. is demonstrating improved tolerance to therapy.)     Assistance Recommended at Discharge Frequent or constant Supervision/Assistance  Equipment Recommendations  Wheelchair (measurements PT);Wheelchair cushion (measurements PT)    Recommendations for Other Services       Precautions / Restrictions Precautions Precautions: Fall Required Braces or Orthoses: Other Brace Other Brace: limb protector Restrictions LLE Weight Bearing: Non weight bearing     Mobility  Bed Mobility Overal bed mobility: Needs Assistance Bed Mobility: Rolling;Sidelying to Sit Rolling: Supervision Sidelying to sit: Min guard       General bed mobility comments: Pt. is encouraged to roll to side prior to sitting up due to back pain.   Completes activity with CGA only and assist for line management.  Pt demos difficulty scooting to EOB and req's min A with use of chuck pad. Patient Response: Cooperative  Transfers                  Lateral/Scoot Transfers: Max assist General transfer comment: PT/OT attempt to encourage pt to trial standing, discuss safety with hand and foot placement, however pt. is reluctant/fearful to try activity secondary to inc back pain.  Pt. is more willing to trial later scoot transfer to chair.  Pt. requires 2 person max A to transfer over to chair with pt, PT and OT working together with use of chuck pad to scoot a little at a time into chair.  Pt. is able to assist with B UEs and R LE.  VCs required to maintain NWB on L residual limb.    Ambulation/Gait                   Stairs             Wheelchair Mobility    Modified Rankin (Stroke Patients Only)       Balance Overall balance assessment: Needs assistance   Sitting balance-Leahy Scale: Fair                                      Cognition Arousal/Alertness: Awake/alert Behavior During Therapy: WFL for tasks assessed/performed Overall Cognitive Status: Within Functional Limits for tasks assessed  General Comments: Pt. is supine in bed when PT/OT arrives.  Agreeable to trial transfer to chair. Family present in room.        Exercises      General Comments        Pertinent Vitals/Pain Pain Assessment: 0-10 Pain Score: 5  Pain Location: States [name] pain is under control but c/o low back pain with movement. Pain Descriptors / Indicators: Aching;Discomfort Pain Intervention(s): Limited activity within patient's tolerance;Monitored during session;Premedicated before session;Repositioned    Home Living                          Prior Function            PT Goals (current goals can now be found in the care plan section) Progress  towards PT goals: Progressing toward goals    Frequency           PT Plan Current plan remains appropriate    Co-evaluation PT/OT/SLP Co-Evaluation/Treatment: Yes Reason for Co-Treatment: For patient/therapist safety;To address functional/ADL transfers PT goals addressed during session: Mobility/safety with mobility        AM-PAC PT "6 Clicks" Mobility   Outcome Measure  Help needed turning from your back to your side while in a flat bed without using bedrails?: A Little Help needed moving from lying on your back to sitting on the side of a flat bed without using bedrails?: A Little Help needed moving to and from a bed to a chair (including a wheelchair)?: A Lot Help needed standing up from a chair using your arms (e.g., wheelchair or bedside chair)?: Total Help needed to walk in hospital room?: Total Help needed climbing 3-5 steps with a railing? : Total 6 Click Score: 11    End of Session Equipment Utilized During Treatment: Gait belt Activity Tolerance: Patient tolerated treatment well Patient left: in chair;with chair alarm set;with nursing/sitter in room;with family/visitor present         Time: 1000-1015 PT Time Calculation (min) (ACUTE ONLY): 15 min  Charges:                        Elese Rane A. Kru Allman, PT, DPT Acute Rehabilitation Services Office: [phone]    Drago Hammonds A Shiquita Collignon 02/26/2021, 10:30 AM

## 2021-02-27 DIAGNOSIS — R7881 Bacteremia: Secondary | ICD-10-CM | POA: Diagnosis not present

## 2021-02-27 DIAGNOSIS — B9561 Methicillin susceptible Staphylococcus aureus infection as the cause of diseases classified elsewhere: Secondary | ICD-10-CM | POA: Diagnosis not present

## 2021-02-27 DIAGNOSIS — M86272 Subacute osteomyelitis, left ankle and foot: Secondary | ICD-10-CM | POA: Diagnosis not present

## 2021-02-27 DIAGNOSIS — E1169 Type 2 diabetes mellitus with other specified complication: Secondary | ICD-10-CM | POA: Diagnosis not present

## 2021-02-27 LAB — CBC
HCT: 27.4 % — ABNORMAL LOW (ref 36.0–46.0)
Hemoglobin: 8.5 g/dL — ABNORMAL LOW (ref 12.0–15.0)
MCH: 27.9 pg (ref 26.0–34.0)
MCHC: 31 g/dL (ref 30.0–36.0)
MCV: 89.8 fL (ref 80.0–100.0)
Platelets: 313 10*3/uL (ref 150–400)
RBC: 3.05 MIL/uL — ABNORMAL LOW (ref 3.87–5.11)
RDW: 12.9 % (ref 11.5–15.5)
WBC: 5.4 10*3/uL (ref 4.0–10.5)
nRBC: 0 % (ref 0.0–0.2)

## 2021-02-27 LAB — GLUCOSE, CAPILLARY
Glucose-Capillary: 150 mg/dL — ABNORMAL HIGH (ref 70–99)
Glucose-Capillary: 173 mg/dL — ABNORMAL HIGH (ref 70–99)
Glucose-Capillary: 131 mg/dL — ABNORMAL HIGH (ref 70–99)
Glucose-Capillary: 143 mg/dL — ABNORMAL HIGH (ref 70–99)

## 2021-02-27 NOTE — Progress Notes (Signed)
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 MRI done on 11/12 shows concern for infection in back at L4-5. PMH includes DM, HTN, obesity.    PT Comments    Pt was seen with mobility tech to get pt to chair today.  Pt is generally anxious and distracted by her LBP.  Pt did not follow instructions well at all times, tending to get focused on her back and less so on the process of transferring.  Even so, her transition to chair was fairly smooth and pt did offer assistance with RLE on the floor and UE's to support and help push over to recliner.  Pt is tired, painful and will recommend that two person help continue, as well as encouraging her to try to stand even if she is not feeling like it will be successful.  Has the tools to try this, but back is mainly the reason she does not.   Recommendations for follow up therapy are one component of a multi-disciplinary discharge planning process, led by the attending physician.  Recommendations may be updated based on patient status, additional functional criteria and insurance authorization.  Follow Up Recommendations  Acute inpatient rehab (3hours/day)     Assistance Recommended at Discharge Frequent or constant Supervision/Assistance  Equipment Recommendations  Wheelchair (measurements PT);Wheelchair cushion (measurements PT)    Recommendations for Other Services Rehab consult     Precautions / Restrictions Precautions Precautions: Fall Precaution Comments: mod fall Required Braces or Orthoses: Other Brace Other Brace: limb protector Restrictions Weight Bearing Restrictions: Yes LLE Weight Bearing: Non weight bearing     Mobility  Bed Mobility Overal bed mobility: Needs Assistance Bed Mobility:  Rolling;Sidelying to Sit Rolling: Min assist Sidelying to sit: Mod assist            Transfers Overall transfer level: Needs assistance   Transfers: Bed to chair/wheelchair/BSC            Lateral/Scoot Transfers: Mod assist;+2 physical assistance;+2 safety/equipment;From elevated surface General transfer comment: pt was better practiced with transition to chair, mod assist for part of the effort with bed pad to assist, min guard to min assist at times due to pt lifting hips better sliding to R side to chair    Ambulation/Gait               General Gait Details: unable to stand   Stairs             Wheelchair Mobility    Modified Rankin (Stroke Patients Only)       Balance Overall balance assessment: Needs assistance Sitting-balance support: Single extremity supported;Bilateral upper extremity supported Sitting balance-Leahy Scale: Fair Sitting balance - Comments: leans posteriorly over spine but tried to encourage upright to reduce back strain Postural control: Right lateral lean;Posterior lean                                  Cognition Arousal/Alertness: Awake/alert Behavior During Therapy: WFL for tasks assessed/performed Overall Cognitive Status: Within Functional Limits for tasks assessed  Exercises      General Comments General comments (skin integrity, edema, etc.): pt assisted to chair and propped her LLE with pillow. Mother brought heating pad and pt declined along with pillow behind spine      Pertinent Vitals/Pain Pain Assessment: Faces Faces Pain Scale: Hurts whole lot Breathing: occasional labored breathing, short period of hyperventilation Negative Vocalization: occasional moan/groan, low speech, negative/disapproving quality Facial Expression: facial grimacing Body Language: tense, distressed pacing, fidgeting Consolability: no need to console PAINAD  Score: 5 Pain Descriptors / Indicators: Grimacing;Guarding Pain Intervention(s): Limited activity within patient's tolerance;Monitored during session;Repositioned;Premedicated before session    Home Living                          Prior Function            PT Goals (current goals can now be found in the care plan section) Acute Rehab PT Goals Patient Stated Goal: decrease pain Progress towards PT goals: Progressing toward goals    Frequency    Min 3X/week      PT Plan Current plan remains appropriate    Co-evaluation              AM-PAC PT "6 Clicks" Mobility   Outcome Measure  Help needed turning from your back to your side while in a flat bed without using bedrails?: A Little Help needed moving from lying on your back to sitting on the side of a flat bed without using bedrails?: A Little Help needed moving to and from a bed to a chair (including a wheelchair)?: A Lot Help needed standing up from a chair using your arms (e.g., wheelchair or bedside chair)?: Total Help needed to walk in hospital room?: Total Help needed climbing 3-5 steps with a railing? : Total 6 Click Score: 11    End of Session Equipment Utilized During Treatment: Gait belt Activity Tolerance: Patient limited by fatigue;Patient limited by pain Patient left: in chair;with chair alarm set;with nursing/sitter in room;with family/visitor present Nurse Communication: Mobility status PT Visit Diagnosis: Other abnormalities of gait and mobility (R26.89);Pain Pain - Right/Left: Left Pain - part of body: Leg     Time: 0932-3557 PT Time Calculation (min) (ACUTE ONLY): 28 min  Charges:  $Therapeutic Activity: 23-37 mins                    Ivar Drape 02/27/2021, 3:23 PM  Samul Dada, PT PhD Acute Rehab Dept. Number: [facility] [id number] and [facility] [phone]

## 2021-02-27 NOTE — Progress Notes (Signed)
Mobility Specialist Progress Note:   02/27/21 1230  Mobility  Activity Transferred:  Chair to bed  Level of Assistance Maximum assist, patient does 25-49% (+2)  Mobility Response Tolerated poorly  Mobility performed by Mobility specialist;Nurse tech  $Mobility charge 1 Mobility   Pt requesting to go back to bed d/t back pain. Lateral scoot to bed from chair with help from chuck pad and NT helping with safety. Pt back in bed with all needs met.  Nelta Numbers Mobility Specialist  Phone [phone]

## 2021-02-27 NOTE — Progress Notes (Signed)
Pt with unrelieved pain after oxycodone 10 mg administered at 1058. Next prn oxycodone due at 1500. Will give prn oxycodone 10-15 mg dose due at 1500 now per Dr Mcarthur Rossetti ok.

## 2021-02-27 NOTE — Progress Notes (Addendum)
HD#10 Subjective:  Patient Summary: Lori Lori Schmidt is a 45 year old female with PMHx of hypertension, hyperlipidemia, type 2 diabetes mellitus, and depression admitted for left lower extremity osteomyelitis s/p [name] complicated by MSSA bacteremia and discitis.  Overnight Events: No acute overnight events.    Interim History: Lori Schmidt was evaluated at bedside this morning.  She states she is feeling well overall with improved back pain.  She does note ongoing leg pain.  She expresses frustration with no rehab options.  Objective:  Vital signs in last 24 hours: Vitals:   02/26/21 1649 02/26/21 1651 02/26/21 2010 02/27/21 0629  BP:  113/74 114/63 105/75  Pulse:  96 (!) 105 88  Resp:  19 18 18   Temp: 98.5 F (36.9 C)  98.7 F (37.1 C) 98.3 F (36.8 C)  TempSrc: Oral  Oral Oral  SpO2:  94% 95% 97%  Weight:      Height:       Supplemental O2: Room Air SpO2: 97 % O2 Flow Rate (L/min): 2 L/min   Physical Exam:  Constitutional: well-appearing middle-aged female lying in bed, in no acute distress HENT: normocephalic atraumatic, mucous membranes moist Cardiovascular: regular rate and rhythm, no m/r/g Pulmonary/Chest: normal work of breathing on room air, lungs clear to auscultation bilaterally Abdominal: soft, non-tender, non-distended, +BS MSK: normal bulk and tone, s/p [name] Neurological: alert & oriented x 3, no apparent focal deficit Skin: warm and dry Psych: Behavior and mood WNL  Filed Weights   02/18/21 0118  Weight: 121.2 kg     Intake/Output Summary (Last 24 hours) at 02/27/2021 0657 Last data filed at 02/26/2021 1811 Gross per 24 hour  Intake 761.7 ml  Output 600 ml  Net 161.7 ml   Net IO Since Admission: 2,464.09 mL [02/27/21 0657]  Pertinent Labs: CBC Latest Ref Rng & Units 02/27/2021 02/26/2021 02/25/2021  WBC 4.0 - 10.5 K/uL 5.4 5.6 5.9  Hemoglobin 12.0 - 15.0 g/dL 8.5(L) 8.4(L) 8.8(L)  Hematocrit 36.0 - 46.0 % 27.4(L) 27.6(L) 28.4(L)   Platelets 150 - 400 K/uL 313 305 277    CMP Latest Ref Rng & Units 02/26/2021 02/25/2021 02/24/2021  Glucose 70 - 99 mg/dL 243(H) 234(H) 60(L)  BUN 6 - 20 mg/dL 11 14 9   Creatinine 0.44 - 1.00 mg/dL 0.44 0.40(L) 0.38(L)  Sodium 135 - 145 mmol/L 137 135 138  Potassium 3.5 - 5.1 mmol/L 4.2 3.8 3.6  Chloride 98 - 111 mmol/L 105 101 104  CO2 22 - 32 mmol/L 23 25 24   Calcium 8.9 - 10.3 mg/dL 7.8(L) 7.1(L) 7.5(L)  Total Protein 6.5 - 8.1 g/dL - - -  Total Bilirubin 0.3 - 1.2 mg/dL - - -  Alkaline Phos 38 - 126 U/L - - -  AST 15 - 41 U/L - - -  ALT 0 - 44 U/L - - -    Imaging: DG LEFT FOOT COMPLETE 02/17/2021 IMPRESSION: 1. No acute fracture or dislocation. 2. Diffuse soft tissue swelling and subcutaneous edema of the dorsum of the foot. Findings may represent cellulitis. Clinical correlation is recommended. 3. Irregularity of the dorsal cortex of a tarsal bone, likely medial cuneiform.  MR LEFT FOOT W WO CONTRAST 02/18/2021 IMPRESSION: 1. Extensive acute osteomyelitis throughout the left hindfoot and forefoot with multifocal bony involvement, as detailed above. 2. Numerous peripherally enhancing joint effusions throughout the left midfoot, many of which are contiguous with rim enhancing fluid collections in the adjacent soft tissues. Findings are compatible with multifocal septic  arthritis and adjacent abscesses. 3. Extensive tenosynovial fluid collections throughout the flexor and extensor tendons of the left foot, compatible with infectious tenosynovitis. 4. Small tibiotalar and subtalar joint effusions, may be reactive or reflect septic arthritis. 5. Marked diffuse cellulitis of the left foot.  MR LEFT TIBIA FIBULA W WO CONTRAST 02/18/2021 IMPRESSION: 1. Findings of diffuse cellulitis and myofasciitis throughout the left lower leg. No organized or rim-enhancing fluid collections. 2. Small volume tenosynovial fluid collections associated with the peroneal tendons,  tibialis posterior tendon, as well as the flexor hallucis longus and flexor digitorum longus tendons at the posterior aspect of the ankle suggesting mild tenosynovitis, which could be reactive or infectious. 3. No evidence of osteomyelitis of the left tibia or fibula.  DG LEFT SHOULDER 02/20/2021 IMPRESSION: No fracture or dislocation.  No soft tissue abnormality  MR LUMBAR SPINE W WO CONTRAST 02/20/2021 IMPRESSION: 1. Edema in the L4-L5 disc with adjacent inferior L4 and superior L5 endplate edema and enhancement. Findings are concerning for discitis/osteomyelitis given the reported clinical suspicion for infection. The degree of endplate enhancement is greater than expected for degenerative/discogenic endplate signal changes. No definite epidural involvement. 2. At L4-L5, inferiorly dissecting right subarticular disc protrusion with mild right subarticular recess and foraminal stenosis.  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   LLE Osteomyelitis s/p [name] L4-L5 Discitis/Osteomyelitis MSSA Bacteremia Patient remains afebrile and without leukocytosis.  Continues to have pain in the left lower extremity and back, although improved from prior.  Currently on oxycodone as needed.  She has received 90 mg over the past 24 hours.  Patient encouraged to participate with PT/OT.  She is medically stable for discharge pending placement.  She is not accepted to CIR here.  Patient is wanting to pursue SNF.  We will follow-up with case management. -Continue IV Ancef, end date  04/14/2021 -Continue oxycodone p.o. as needed -Continue Robaxin 1000 mg every 8 hours -Continue PT/OT -Follow-up with case management for SNF placement  Type II diabetes mellitus Most recent hemoglobin A1c 12.8. Patient is on Levemir 100 units at bedtime and Januvia 100 mg at bedtime at home with metformin 500 mg at bedtime.  CBGs have been well controlled on current regimen. -Continue metformin 500 mg daily, Tradjenta 5 mg daily -Continue Levemir 55 units daily, NovoLog 8 units 3 times daily with meals and sliding scale with meals and at bedtime -Continue CBG monitoring  Hypertension Hyperlipidemia  Continue pravastatin 40 mg daily Currently normotensive, will resume home losartan 25 mg daily if hypertensive  Depression Continue Celexa 40 mg daily  Diet: Carb-Modified IVF: None,None VTE: Enoxaparin Code: Full PT/OT recs: SNF for Subacute PT, wheelchair. TOC recs: Patient accepted at [facility], pending SNF placement for short-term rehab  Prior to Admission Living Arrangement: Home Anticipated Discharge Location: SNF Barriers to Discharge: Pending placement Dispo: Anticipated discharge to Rehab pending placement.   Eliezer Bottom, MD Internal Medicine Resident PGY-3 Pager# [phone]  Please contact the on call pager after 5 pm and on weekends at [phone].

## 2021-02-28 ENCOUNTER — Inpatient Hospital Stay (HOSPITAL_COMMUNITY): Payer: Managed Care, Other (non HMO)

## 2021-02-28 DIAGNOSIS — R7881 Bacteremia: Secondary | ICD-10-CM | POA: Diagnosis not present

## 2021-02-28 DIAGNOSIS — B9561 Methicillin susceptible Staphylococcus aureus infection as the cause of diseases classified elsewhere: Secondary | ICD-10-CM | POA: Diagnosis not present

## 2021-02-28 DIAGNOSIS — L8990 Pressure ulcer of unspecified site, unspecified stage: Secondary | ICD-10-CM | POA: Insufficient documentation

## 2021-02-28 LAB — GLUCOSE, CAPILLARY
Glucose-Capillary: 122 mg/dL — ABNORMAL HIGH (ref 70–99)
Glucose-Capillary: 126 mg/dL — ABNORMAL HIGH (ref 70–99)
Glucose-Capillary: 110 mg/dL — ABNORMAL HIGH (ref 70–99)
Glucose-Capillary: 97 mg/dL (ref 70–99)

## 2021-02-28 IMAGING — MR MR LUMBAR SPINE WO/W CM
4 of 7 series · 18 of 48 positions shown · IV contrast (gadavist)
Comparison: MRI [DATE]

CLINICAL DATA: Low back pain, infection suspected

EXAM:
MRI LUMBAR SPINE WITHOUT AND WITH CONTRAST
TECHNIQUE: Multiplanar and multiecho pulse sequences of the lumbar spine were
obtained without and with intravenous contrast.
CONTRAST:  10mL GADAVIST GADOBUTROL 1 MMOL/ML IV SOLN

[Series 3: T2 · sagittal · 4.0mm · 0.55mm/px · 5 of 17 slices shown (1 of 2)]
[im 1/17]
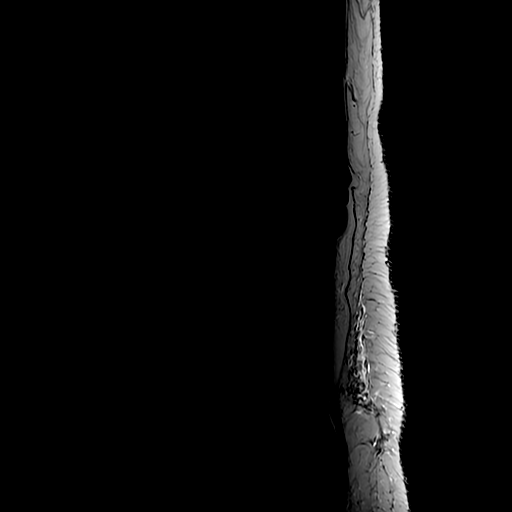
[im 5/17]
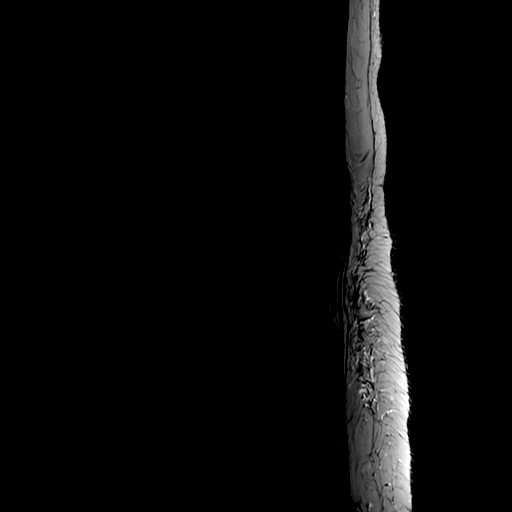
[im 9/17]
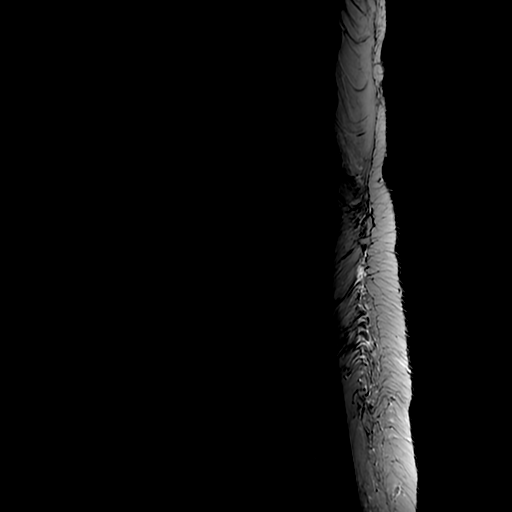
[im 13/17]
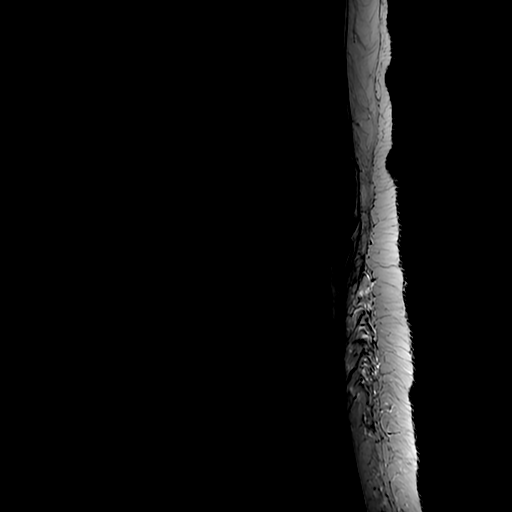
[im 17/17]
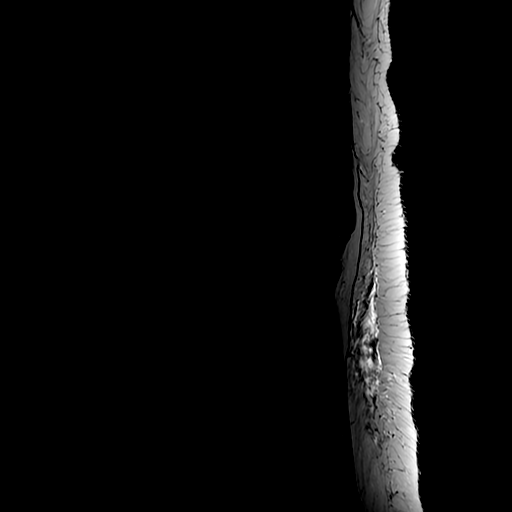

[Series 5: T1 · sagittal · 4.0mm · 0.55mm/px · 3 of 17 slices shown (1 of 2)]
[im 1/17]
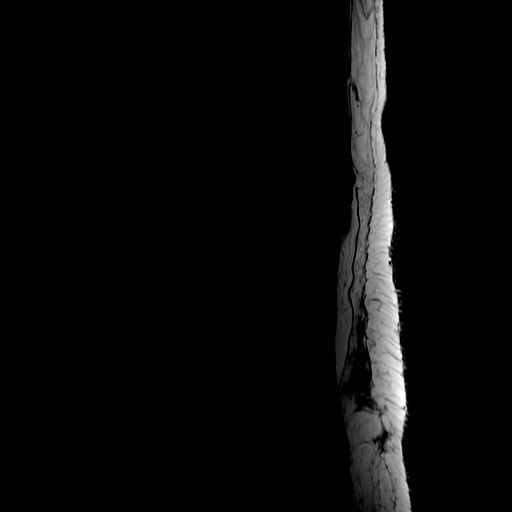
[im 11/17]
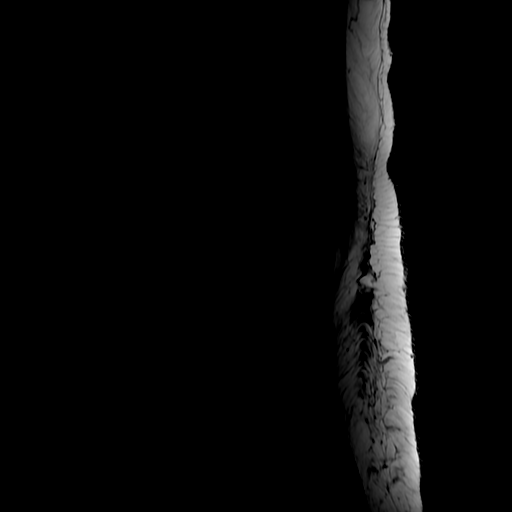
[im 17/17]
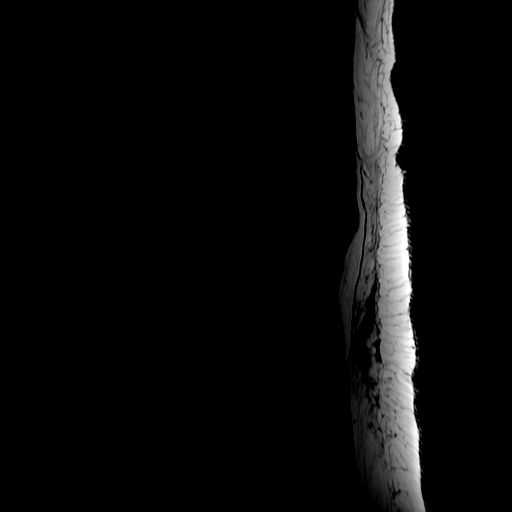

[Series 6: T2 · axial · 4.0mm · 0.39mm/px · z∈[-117,+75]mm · 7 of 41 slices shown (2 of 2)]
[im 1/41]
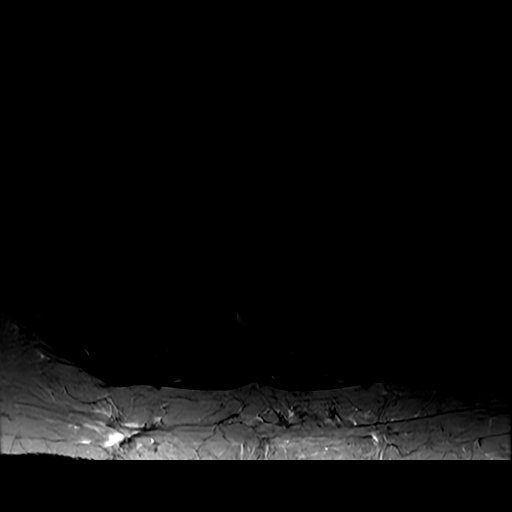
[im 5/41]
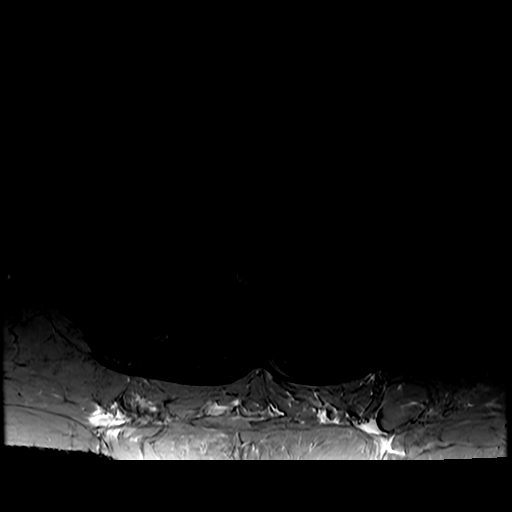
[im 14/41]
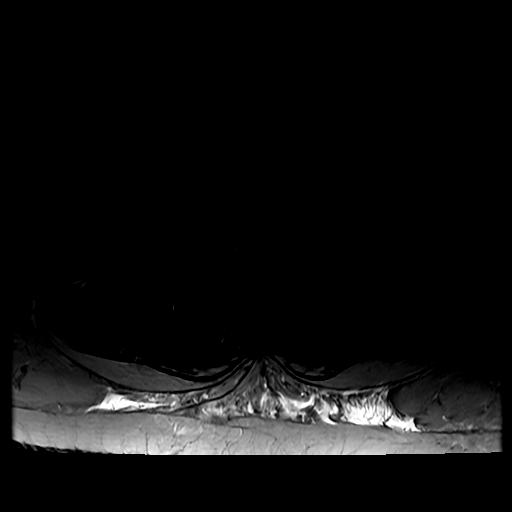
[im 18/41]
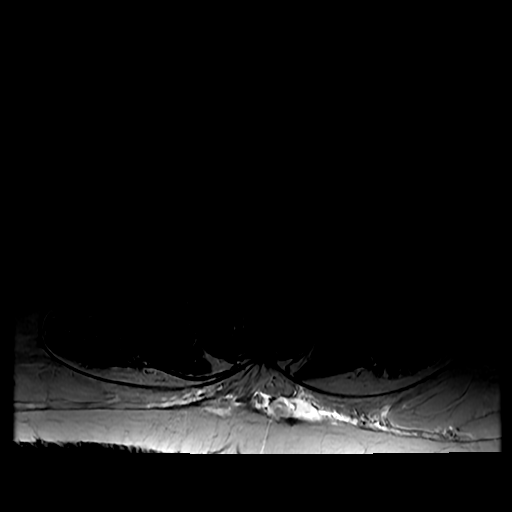
[im 23/41]
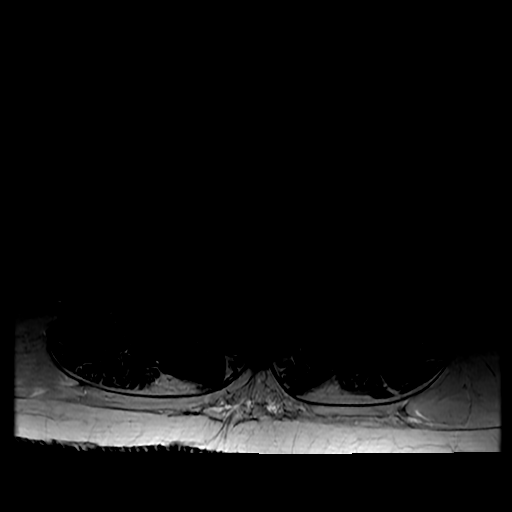
[im 27/41]
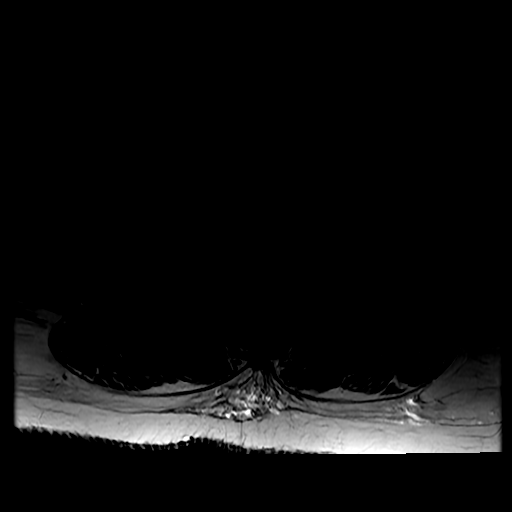
[im 36/41]
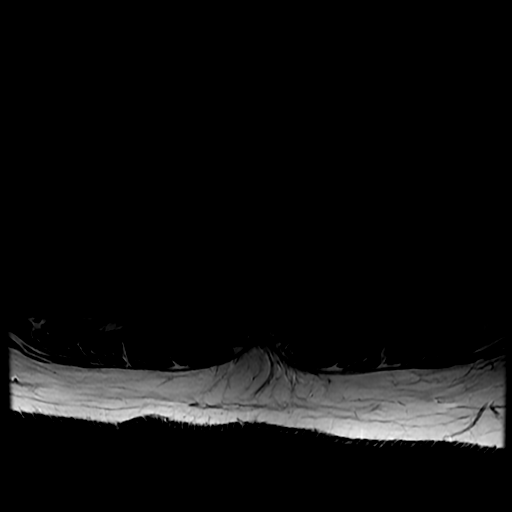

[Series 7: T1 · axial · 4.0mm · 0.39mm/px · z∈[-97,+75]mm · 3 of 41 slices shown (2 of 2)]
[im 5/41]
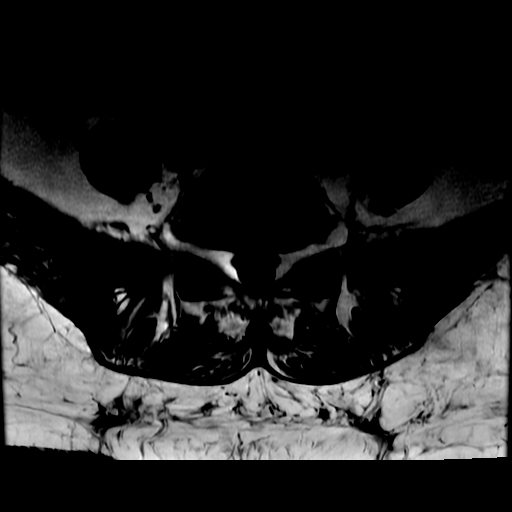
[im 23/41]
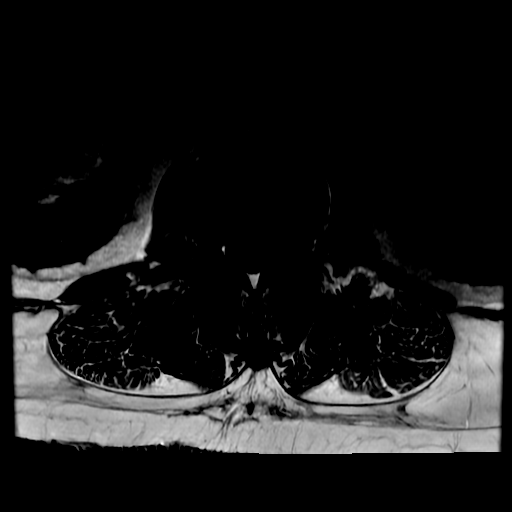
[im 36/41]
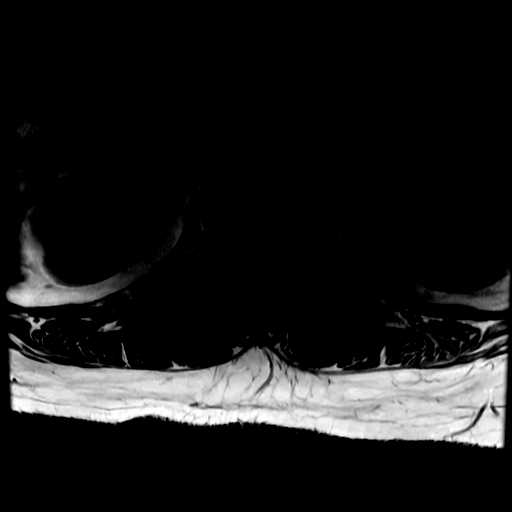

[18 of 48 positions shown; findings below may reference images not displayed]

FINDINGS: Segmentation: Standard segmentation is assumed. Same numbering
system as utilized on the previous report.

Alignment:  Physiologic.

Vertebrae: Redemonstrated edema and enhancement involving the
inferior endplate of L4 and superior endplate of L5. The degree of
signal changes at the endplates has slightly progressed compared to
the previous MRI. No definite endplate erosion. There are a few foci
of T2 hyperintense signal again seen within the L4-5 disc. No
definite epidural involvement.

Remaining intervertebral discs are within normal limits. No
fracture. There are a few scattered intraosseous hemangiomas.

Conus medullaris and cauda equina: Conus extends to the L1 level.
Conus and cauda equina appear normal.

Paraspinal and other soft tissues: No paravertebral inflammatory
changes. Preserved signal within the psoas muscles. No paravertebral
fluid collections.

Disc levels:

T12-L1: Unremarkable.

L1-L2: Unremarkable.

L2-L3: Mild disc bulge.  No foraminal or canal stenosis.  Unchanged.

L3-L4: Mild diffuse disc bulge and mild bilateral facet arthropathy.
No foraminal or canal stenosis. Unchanged.

L4-L5: Diffuse disc bulge with small central/right paracentral
protrusion. Mild bilateral facet arthropathy. Similar degree of
subarticular recess stenosis, more pronounced on the right.
Borderline-mild bilateral foraminal stenosis. No canal stenosis.
Unchanged.

L5-S1: Mild disc bulge without foraminal or canal stenosis.
Unchanged.
IMPRESSION: 1. Redemonstrated marrow edema and enhancement centered at the L4-5
level. The degree of signal changes at this level has slightly
progressed compared to the previous MRI. No definite endplate
erosion or definite epidural involvement. Given the interval
progression of signal changes, findings are favored to represent
changes of early discitis-osteomyelitis rather than discogenic
endplate changes.
2. Unchanged mild multilevel degenerative changes of the lumbar
spine, as detailed above.

## 2021-02-28 MED ORDER — MORPHINE SULFATE ER 15 MG PO TBCR
15.0000 mg | EXTENDED_RELEASE_TABLET | Freq: Two times a day (BID) | ORAL | Status: DC
  Administered 2021-02-28: 15 mg via ORAL
  Filled 2021-02-28: qty 1

## 2021-02-28 MED ORDER — METHOCARBAMOL 750 MG PO TABS
750.0000 mg | ORAL_TABLET | Freq: Four times a day (QID) | ORAL | Status: DC
  Administered 2021-02-28 – 2021-03-05 (×22): 750 mg via ORAL
  Filled 2021-02-28 (×23): qty 1

## 2021-02-28 MED ORDER — HYDROMORPHONE HCL 1 MG/ML IJ SOLN
1.0000 mg | Freq: Once | INTRAMUSCULAR | Status: AC
  Administered 2021-02-28: 1 mg via INTRAVENOUS
  Filled 2021-02-28: qty 1

## 2021-02-28 MED ORDER — OXYCODONE HCL ER 15 MG PO T12A
15.0000 mg | EXTENDED_RELEASE_TABLET | Freq: Two times a day (BID) | ORAL | Status: DC
  Administered 2021-02-28 – 2021-03-03 (×6): 15 mg via ORAL
  Filled 2021-02-28 (×6): qty 1

## 2021-02-28 MED ORDER — GADOBUTROL 1 MMOL/ML IV SOLN
10.0000 mL | Freq: Once | INTRAVENOUS | Status: AC | PRN
  Administered 2021-02-28: 10 mL via INTRAVENOUS

## 2021-02-28 NOTE — Progress Notes (Incomplete)
Pt arrived to MRI feeling unwell. Pt stated they felt very hot but decided to go through with the exam. Offered bag if pt began to feel as though they needed to throw up. Pt requested a cold rag over their head which was provided. Pt given alert squeeze ball if needed. Upon exiting the scanner following completion of the exam. Pt vomited stating she just felt so hot and stated they wish they hadn't ate before coming down for exam. Pt cleaned up best as possible in the department. RN made aware.

## 2021-02-28 NOTE — Progress Notes (Signed)
HD#11 Subjective:  Overnight Events: no event  Patient is seen at bedside. She is tearful and frustrated that her back pain is not getting better. She endorsed significant pain and muscle spasm of her lumbar spine last night. She states that she is determined to work with PT but her pain is a limiting factor. Denies numbness or tingling of lower legs. States that the muscle relaxant helps but it does not kick in until a while later.   Objective:  Vital signs in last 24 hours: Vitals:   02/27/21 0825 02/27/21 1614 02/27/21 1942 02/28/21 0351  BP: 130/79 106/69 127/66 103/70  Pulse: 94 (!) 102 (!) 103 93  Resp: 20 17 18 17   Temp: 98.2 F (36.8 C) 98.1 F (36.7 C) 99.5 F (37.5 C) 98.9 F (37.2 C)  TempSrc: Oral  Oral Oral  SpO2: 98% 95% 95% 93%  Weight:      Height:       Supplemental O2: Room Air SpO2: 93 % O2 Flow Rate (L/min): 2 L/min   Physical Exam:  Physical Exam Constitutional:      General: She is in acute distress.  HENT:     Head: Normocephalic.  Eyes:     General:        Right eye: No discharge.        Left eye: No discharge.     Conjunctiva/sclera: Conjunctivae normal.  Cardiovascular:     Rate and Rhythm: Normal rate and regular rhythm.  Pulmonary:     Effort: Pulmonary effort is normal. No respiratory distress.  Abdominal:     General: Bowel sounds are normal.     Palpations: Abdomen is soft.  Musculoskeletal:     Cervical back: Normal range of motion.     Comments: Able to flex right hip. No pain to palpation at left leg. Intact sensation of right leg.  Skin:    General: Skin is warm.     Coloration: Skin is not jaundiced.  Neurological:     Mental Status: She is alert.  Psychiatric:        Mood and Affect: Mood normal.        Behavior: Behavior normal.    Filed Weights   02/18/21 0118  Weight: 121.2 kg     Intake/Output Summary (Last 24 hours) at 02/28/2021 0805 Last data filed at 02/28/2021 [id number] Gross per 24 hour  Intake 1707.94  ml  Output 3200 ml  Net -1492.06 ml   Net IO Since Admission: 972.03 mL [02/28/21 0805]  Pertinent Labs: CBC Latest Ref Rng & Units 02/27/2021 02/26/2021 02/25/2021  WBC 4.0 - 10.5 K/uL 5.4 5.6 5.9  Hemoglobin 12.0 - 15.0 g/dL 8.5(L) 8.4(L) 8.8(L)  Hematocrit 36.0 - 46.0 % 27.4(L) 27.6(L) 28.4(L)  Platelets 150 - 400 K/uL 313 305 277    CMP Latest Ref Rng & Units 02/26/2021 02/25/2021 02/24/2021  Glucose 70 - 99 mg/dL 243(H) 234(H) 60(L)  BUN 6 - 20 mg/dL 11 14 9   Creatinine 0.44 - 1.00 mg/dL 0.44 0.40(L) 0.38(L)  Sodium 135 - 145 mmol/L 137 135 138  Potassium 3.5 - 5.1 mmol/L 4.2 3.8 3.6  Chloride 98 - 111 mmol/L 105 101 104  CO2 22 - 32 mmol/L 23 25 24   Calcium 8.9 - 10.3 mg/dL 7.8(L) 7.1(L) 7.5(L)  Total Protein 6.5 - 8.1 g/dL - - -  Total Bilirubin 0.3 - 1.2 mg/dL - - -  Alkaline Phos 38 - 126 U/L - - -  AST 15 -  41 U/L - - -  ALT 0 - 44 U/L - - -    Imaging: No results found.  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   LLE Osteomyelitis s/p [name] L4-L5 Discitis/Osteomyelitis MSSA Bacteremia Patient remains afebrile and without leukocytosis. However the lack of improvement of her back pain is concerning. Will repeat MRI lumbar to rule out worsening infection.  Currently on oxycodone as needed.  She has received 65 mg over the past 24 hours. Last BM was yesterday Patient encouraged to participate with PT/OT.   -Ordered MRI lumbar -Continue IV Ancef, end date 04/14/2021 -Continue oxycodone p.o. as needed. Bowel regimen in place. -Will make Robaxin  more frequently  -Continue PT/OT -Follow-up with case management for SNF placement.    Type II diabetes mellitus Most recent hemoglobin A1c 12.8. Patient is on Levemir 100 units at bedtime and Januvia 100 mg at bedtime at home with metformin 500 mg at bedtime.   CBGs have been well controlled on current regimen. -Continue metformin 500 mg daily, Tradjenta 5 mg daily -Continue Levemir 55 units daily, NovoLog 8 units 3 times daily with meals and sliding scale with meals and at bedtime -Continue CBG monitoring   Hypertension Hyperlipidemia  Continue pravastatin 40 mg daily Currently normotensive, will resume home losartan 25 mg daily if hypertensive   Depression Continue Celexa 40 mg daily   Diet: Carb-Modified IVF: None,None VTE: Enoxaparin Code: Full PT/OT recs: CIR    Prior to Admission Living Arrangement: Home Anticipated Discharge Location: SNF Barriers to Discharge: Pending placement Dispo: Anticipated discharge to Rehab pending placement.   Doran Stabler, DO 02/28/2021, 8:05 AM Pager: [phone]  Please contact the on call pager after 5 pm and on weekends at [phone].

## 2021-03-01 DIAGNOSIS — B9561 Methicillin susceptible Staphylococcus aureus infection as the cause of diseases classified elsewhere: Secondary | ICD-10-CM | POA: Diagnosis not present

## 2021-03-01 DIAGNOSIS — M86272 Subacute osteomyelitis, left ankle and foot: Secondary | ICD-10-CM | POA: Diagnosis not present

## 2021-03-01 DIAGNOSIS — M4646 Discitis, unspecified, lumbar region: Secondary | ICD-10-CM | POA: Diagnosis not present

## 2021-03-01 DIAGNOSIS — R7881 Bacteremia: Secondary | ICD-10-CM | POA: Diagnosis not present

## 2021-03-01 LAB — CBC
HCT: 28.8 % — ABNORMAL LOW (ref 36.0–46.0)
Hemoglobin: 9 g/dL — ABNORMAL LOW (ref 12.0–15.0)
MCH: 28 pg (ref 26.0–34.0)
MCHC: 31.3 g/dL (ref 30.0–36.0)
MCV: 89.4 fL (ref 80.0–100.0)
Platelets: 370 10*3/uL (ref 150–400)
RBC: 3.22 MIL/uL — ABNORMAL LOW (ref 3.87–5.11)
RDW: 12.7 % (ref 11.5–15.5)
WBC: 6 10*3/uL (ref 4.0–10.5)
nRBC: 0 % (ref 0.0–0.2)

## 2021-03-01 LAB — GLUCOSE, CAPILLARY
Glucose-Capillary: 116 mg/dL — ABNORMAL HIGH (ref 70–99)
Glucose-Capillary: 89 mg/dL (ref 70–99)

## 2021-03-01 MED ORDER — PROSOURCE PLUS PO LIQD
30.0000 mL | Freq: Two times a day (BID) | ORAL | Status: DC
  Administered 2021-03-01 – 2021-03-05 (×8): 30 mL via ORAL
  Filled 2021-03-01 (×8): qty 30

## 2021-03-01 MED ORDER — OXYCODONE HCL 5 MG PO TABS: 10.0000 mg | ORAL_TABLET | ORAL | Status: DC | PRN

## 2021-03-01 MED ORDER — OXYCODONE HCL 5 MG PO TABS
10.0000 mg | ORAL_TABLET | ORAL | Status: DC | PRN
  Administered 2021-03-01 – 2021-03-02 (×3): 10 mg via ORAL
  Filled 2021-03-01 (×3): qty 2

## 2021-03-01 NOTE — Progress Notes (Signed)
Inpatient Rehab Admissions Coordinator:   Acute therapy feels Pt could tolerate intensity of CIR. I will follow up with Pt. Tomorrow and open case with insurance if she wants to pursue CIR.  Megan Salon, MS, CCC-SLP Rehab Admissions Coordinator  [phone] (celll) [phone] (office)

## 2021-03-01 NOTE — NC FL2 (Signed)
[place] MEDICAID FL2 LEVEL OF CARE SCREENING TOOL     IDENTIFICATION  Patient Name: Lori Schmidt Birthdate: [birth year] Sex: female Admission Date (Current Location): 02/17/2021  [facility] and [place] Number:  Producer, television/film/video and Address:  [facility] [place], [address] [place], [place] [address]      Provider Number: [id number]  Attending Physician Name and Address:  Reymundo Poll, MD  Relative Name and Phone Number:       Current Level of Care: Hospital Recommended Level of Care: Skilled Nursing Facility Prior Approval Number:    Date Approved/Denied:   PASRR Number:    Discharge Plan: SNF    Current Diagnoses: Patient Active Problem List   Diagnosis Date Noted   Pressure injury of skin 02/28/2021   Discitis of lumbar region    Subacute osteomyelitis, left ankle and foot (HCC)    Diabetes mellitus (HCC)    Left leg cellulitis    Bacteremia due to Staphylococcus aureus 02/17/2021   Morbid obesity with BMI of 45.0-49.9, adult (HCC) 01/05/2012    Orientation RESPIRATION BLADDER Height & Weight     Self, Time, Situation  Normal Continent Weight: 267 lb 3.2 oz (121.2 kg) Height:  5\' 7"  (170.2 cm)  BEHAVIORAL SYMPTOMS/MOOD NEUROLOGICAL BOWEL NUTRITION STATUS      Continent Diet  AMBULATORY STATUS COMMUNICATION OF NEEDS Skin   Extensive Assist Verbally Normal                       Personal Care Assistance Level of Assistance  Bathing, Feeding, Dressing Bathing Assistance: Maximum assistance Feeding assistance: Limited assistance Dressing Assistance: Maximum assistance     Functional Limitations Info  Sight, Hearing, Speech Sight Info: Adequate Hearing Info: Adequate Speech Info: Adequate    SPECIAL CARE FACTORS FREQUENCY  PT (By licensed PT), OT (By licensed OT)     PT Frequency: 5x a week OT Frequency: 5x a week            Contractures Contractures Info: Not present    Additional Factors Info   Code Status, Allergies Code Status Info: Full Allergies Info: Sulfa Antibiotics   Lisinopril           Current Medications (03/01/2021):  This is the current hospital active medication list Current Facility-Administered Medications  Medication Dose Route Frequency Provider Last Rate Last Admin   (feeding supplement) PROSource Plus liquid 30 mL  30 mL Oral BID BM Guilloud, 03/03/2021, MD       0.9 %  sodium chloride infusion   Intravenous Continuous Eber Jones, DO   Stopped at 02/28/21 2203   acetaminophen (TYLENOL) tablet 325-650 mg  325-650 mg Oral Q6H PRN 2204, MD       alum & mag hydroxide-simeth (MAALOX/MYLANTA) 200-200-20 MG/5ML suspension 15-30 mL  15-30 mL Oral Q2H PRN 08-[birth year]1974, MD       ascorbic acid (VITAMIN C) tablet 1,000 mg  1,000 mg Oral Daily Nadara Mustard, MD   1,000 mg at 03/01/21 1014   bisacodyl (DULCOLAX) EC tablet 5 mg  5 mg Oral Daily PRN 03/03/21, MD   5 mg at 02/24/21 1516   ceFAZolin (ANCEF) IVPB 2g/100 mL premix  2 g Intravenous Q8H 02/26/21, MD 200 mL/hr at 03/01/21 0555 Infusion Verify at 03/01/21 0555   Chlorhexidine Gluconate Cloth 2 % PADS 6 each  6 each Topical Daily 03/03/21, MD   6 each at  02/28/21 2201   citalopram (CELEXA) tablet 40 mg  40 mg Oral Daily Nadara Mustard, MD   40 mg at 03/01/21 1013   docusate sodium (COLACE) capsule 100 mg  100 mg Oral Daily Nadara Mustard, MD   100 mg at 03/01/21 1013   enoxaparin (LOVENOX) injection 60 mg  60 mg Subcutaneous Q24H Nadara Mustard, MD   60 mg at 03/01/21 1015   feeding supplement (ENSURE ENLIVE / ENSURE PLUS) liquid 237 mL  237 mL Oral BID Atway, Rayann N, DO   237 mL at 03/01/21 1012   guaiFENesin-dextromethorphan (ROBITUSSIN DM) 100-10 MG/5ML syrup 15 mL  15 mL Oral Q4H PRN Nadara Mustard, MD   15 mL at 02/22/21 1154   hydrALAZINE (APRESOLINE) injection 5 mg  5 mg Intravenous Q20 Min PRN Nadara Mustard, MD       ibuprofen (ADVIL) tablet 800 mg  800 mg Oral Q8H PRN Nadara Mustard, MD   800 mg at 02/26/21 1957   insulin aspart (novoLOG) injection 0-20 Units  0-20 Units Subcutaneous TID WC Nadara Mustard, MD   3 Units at 02/28/21 1242   insulin aspart (novoLOG) injection 0-5 Units  0-5 Units Subcutaneous QHS Nadara Mustard, MD   2 Units at 02/25/21 2139   insulin aspart (novoLOG) injection 8 Units  8 Units Subcutaneous TID WC Atway, Rayann N, DO   8 Units at 03/01/21 0846   insulin detemir (LEVEMIR) injection 55 Units  55 Units Subcutaneous QHS Atway, Rayann N, DO   55 Units at 02/28/21 2201   linagliptin (TRADJENTA) tablet 5 mg  5 mg Oral Daily Atway, Rayann N, DO   5 mg at 03/01/21 1014   magnesium sulfate IVPB 2 g 50 mL  2 g Intravenous Daily PRN Nadara Mustard, MD       metFORMIN (GLUCOPHAGE) tablet 500 mg  500 mg Oral Q breakfast Leander Rams, RPH   500 mg at 03/01/21 9702   methocarbamol (ROBAXIN) tablet 750 mg  750 mg Oral Q6H Doran Stabler, DO   750 mg at 03/01/21 1130   ondansetron (ZOFRAN) injection 4 mg  4 mg Intravenous Q6H PRN Nadara Mustard, MD   4 mg at 02/26/21 0151   oxyCODONE (Oxy IR/ROXICODONE) immediate release tablet 10 mg  10 mg Oral Q4H PRN Doran Stabler, DO       oxyCODONE (OXYCONTIN) 12 hr tablet 15 mg  15 mg Oral Q12H Doran Stabler, DO   15 mg at 03/01/21 1014   pantoprazole (PROTONIX) EC tablet 40 mg  40 mg Oral Daily Nadara Mustard, MD   40 mg at 03/01/21 1014   phenol (CHLORASEPTIC) mouth spray 1 spray  1 spray Mouth/Throat PRN Nadara Mustard, MD       polyethylene glycol (MIRALAX / GLYCOLAX) packet 17 g  17 g Oral Daily Doran Stabler, DO   17 g at 03/01/21 1012   pravastatin (PRAVACHOL) tablet 40 mg  40 mg Oral Daily Atway, Rayann N, DO   40 mg at 03/01/21 1013   sodium chloride flush (NS) 0.9 % injection 10-40 mL  10-40 mL Intracatheter Q12H Reymundo Poll, MD   10 mL at 03/01/21 1016   sodium chloride flush (NS) 0.9 % injection 10-40 mL  10-40 mL Intracatheter PRN Reymundo Poll, MD       vitamin B-12 (CYANOCOBALAMIN) tablet 500 mcg   500 mcg Oral Namon Cirri, MD   500 mcg at  02/28/21 1224   zinc sulfate capsule 220 mg  220 mg Oral Daily Nadara Mustard, MD   220 mg at 03/01/21 1013     Discharge Medications: Please see discharge summary for a list of discharge medications.  Relevant Imaging Results:  Relevant Lab Results:   Additional Information ssn [id number]  Jimmy Picket, LCSW

## 2021-03-01 NOTE — Progress Notes (Signed)
Mobility Specialist Progress Note:   03/01/21 1216  Mobility  Activity Transferred:  Chair to bed  Level of Assistance Moderate assist, patient does 50-74%  Assistive Device None  Mobility Response Tolerated fair  Mobility performed by Mobility specialist;Nurse tech  $Mobility charge 1 Mobility   Pt received in chair, requesting to get back in bed. Had transferred from bed to chair with therapy this am. Attempted to stand with stedy x3 with no success d/t significant back pain. Pt slid to bed from chair with modA of 2 using chuck pad. Pt back in bed, comfortable.   Addison Lank Mobility Specialist  Phone [phone]

## 2021-03-01 NOTE — Progress Notes (Signed)
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 MRI done on 11/12 shows concern for infection in back at L4-5. PMH includes DM, HTN, obesity.    PT Comments    Pt admitted with above diagnosis. Pt was able to stand to [facility] with min assist of 2 persons.  Pt needed encouragement but on second attempt stood fully and pt was so happy. Pt then was able to stand again from the pads to get to chair. Pt also performed some LE exercises. Pt. Progressing and did much better tod ay with therapy.   Pt currently with functional limitations due to balance and endurance deficits. Pt will benefit from skilled PT to increase their independence and safety with mobility to allow discharge to the venue listed below.      Recommendations for follow up therapy are one component of a multi-disciplinary discharge planning process, led by the attending physician.  Recommendations may be updated based on patient status, additional functional criteria and insurance authorization.  Follow Up Recommendations  Acute inpatient rehab (3hours/day)     Assistance Recommended at Discharge Frequent or constant Supervision/Assistance  Equipment Recommendations  Wheelchair (measurements PT);Wheelchair cushion (measurements PT)    Recommendations for Other Services Rehab consult     Precautions / Restrictions Precautions Precautions: Fall Precaution Comments: mod fall Required Braces or Orthoses: Other Brace Other Brace: limb protector Restrictions Weight Bearing Restrictions: Yes LLE Weight Bearing: Non weight bearing     Mobility  Bed Mobility Overal bed mobility: Needs Assistance Bed Mobility: Rolling;Sidelying to Sit Rolling: Min guard Sidelying to sit: Mod assist        General bed mobility comments: patient fearful of back pain before attempting to get to EOB but was relieved that she did not have increased pain after sitting up    Transfers Overall transfer level: Needs assistance Equipment used: 2 person hand held assist Transfers: Bed to chair/wheelchair/BSC             General transfer comment: sit to stand performed from EOB with min assist +2 into stedy and min assist to stand from [employer] via Lift Equipment: Stedy  Ambulation/Gait                   Stairs             Wheelchair Mobility    Modified Rankin (Stroke Patients Only)       Balance Overall balance assessment: Needs assistance Sitting-balance support: Single extremity supported;Bilateral upper extremity supported Sitting balance-Leahy Scale: Fair Sitting balance - Comments: improved sitting balance with min guard and decreased complaints of pain Postural control: Posterior lean Standing balance support: Bilateral upper extremity supported Standing balance-Leahy Scale: Poor Standing balance comment: stood with Antony Salmon                            Cognition Arousal/Alertness: Awake/alert Behavior During Therapy: WFL for tasks assessed/performed Overall Cognitive Status: Within Functional Limits for tasks assessed Area of Impairment: Awareness;Following commands;Problem solving                       Following Commands: Follows one step commands consistently Safety/Judgement: Decreased awareness of safety;Decreased awareness of deficits Awareness: Emergent Problem Solving: Decreased initiation;Requires  verbal cues;Requires tactile cues General Comments: Patient agreeable to using stedy for transfer to recliner        Exercises General Exercises - Lower Extremity Long Arc Quad: AROM;Left;10 reps;Seated Hip ABduction/ADduction: AROM;Left;10 reps;Seated Straight Leg Raises: AROM;10 reps;Left;Seated Amputee  Exercises Quad Sets: AROM;Left;5 reps;Supine    General Comments        Pertinent Vitals/Pain Pain Assessment: Faces Faces Pain Scale: Hurts even more Pain Location: lower back pain Pain Descriptors / Indicators: Grimacing;Guarding Pain Intervention(s): Limited activity within patient's tolerance;Monitored during session;Repositioned;Patient requesting pain meds-RN notified    Home Living                          Prior Function            PT Goals (current goals can now be found in the care plan section) Acute Rehab PT Goals Patient Stated Goal: decrease pain Progress towards PT goals: Progressing toward goals    Frequency    Min 3X/week      PT Plan Current plan remains appropriate    Co-evaluation PT/OT/SLP Co-Evaluation/Treatment: Yes Reason for Co-Treatment: Necessary to address cognition/behavior during functional activity;For patient/therapist safety PT goals addressed during session: Mobility/safety with mobility OT goals addressed during session: Other (comment) (functional transfers)      AM-PAC PT "6 Clicks" Mobility   Outcome Measure  Help needed turning from your back to your side while in a flat bed without using bedrails?: A Little Help needed moving from lying on your back to sitting on the side of a flat bed without using bedrails?: A Little Help needed moving to and from a bed to a chair (including a wheelchair)?: Total Help needed standing up from a chair using your arms (e.g., wheelchair or bedside chair)?: Total Help needed to walk in hospital room?: Total Help needed climbing 3-5 steps with a railing? : Total 6 Click Score: 10    End of Session Equipment Utilized During Treatment: Gait belt Activity Tolerance: Patient limited by fatigue;Patient limited by pain Patient left: in chair;with chair alarm set;with family/visitor present;with call bell/phone within reach Nurse Communication: Mobility status;Patient requests pain  meds PT Visit Diagnosis: Other abnormalities of gait and mobility (R26.89);Pain Pain - Right/Left: Left Pain - part of body: Leg     Time: 3976-7341 PT Time Calculation (min) (ACUTE ONLY): 24 min  Charges:  $Therapeutic Activity: 8-22 mins                     Gaines Cartmell M,PT Acute Rehab Services [phone] (pager)    Bevelyn Buckles 03/01/2021, 1:37 PM

## 2021-03-01 NOTE — Progress Notes (Signed)
Initial Nutrition Assessment  DOCUMENTATION CODES:   Obesity unspecified  INTERVENTION:  - Continue Ensure Enlive po BID, each supplement provides 350 kcal and 20 grams of protein  - 30 ml ProSource Plus BID, each supplement provides 100 kcals and 15 grams protein.   - Discontinue Juven   - Encourage PO intake  NUTRITION DIAGNOSIS:   Increased nutrient needs related to wound healing ([name] and wounds) as evidenced by estimated needs.  GOAL:   Patient will meet greater than or equal to 90% of their needs  MONITOR:   PO intake, Supplement acceptance, Labs, Weight trends  REASON FOR ASSESSMENT:   Consult Other (Comment) (supplement options other than Juven)  ASSESSMENT:     Pt admitted with lower extremity swelling secondary to bacteremia. PMH obesity, T2DM, depression, and hyperlipidemia.   11/11: L transtibial amputation secondary to osteomyelitis abscess and ulceration and cellulitis  Pt reports PTA eating poorly. She works in the [employer] at [employer] and with her work schedule it makes it hard to have a meal. She typically just snacks throughout the day on things such as pizza and chips but does not usually eat meals. For about a week and a half she has had poor PO intake and appetite in addition to vomiting. She reports that her appetite is improving and denies and current vomiting. Noted meal completions of 75-100%.  She states that her weight PTA was about 270 lbs and is unsure of the exact number but believes she has lost about 10 lbs d/t poor PO intake. Limited weight history documented in chart. Will continue to monitor trends.  Medications: vitamin c, colace, SSI, levemir, metformin, protonix, miralax, Vitamin B12, zinc, IV abx  Labs: CBG's: 89-126 x 24 hours  NUTRITION - FOCUSED PHYSICAL EXAM:  Flowsheet Row Most Recent Value  Orbital Region No depletion  Upper Arm Region No depletion  Thoracic and Lumbar Region No depletion  Buccal Region  No depletion  Temple Region Mild depletion  Clavicle Bone Region No depletion  Clavicle and Acromion Bone Region No depletion  Scapular Bone Region No depletion  Dorsal Hand Mild depletion  Patellar Region No depletion  Anterior Thigh Region No depletion  Posterior Calf Region No depletion  Edema (RD Assessment) Moderate  Hair Reviewed  Eyes Reviewed  Mouth Reviewed  Skin Reviewed  Nails Reviewed       Diet Order:   Diet Order             Diet Carb Modified Fluid consistency: Thin; Room service appropriate? Yes  Diet effective now                   EDUCATION NEEDS:   No education needs have been identified at this time  Skin:  Skin Assessment: Skin Integrity Issues: Skin Integrity Issues:: Other (Comment), Stage I, Incisions Stage I: L buttocks Incisions: L pretbial; L leg Other: MASD-perineum  Last BM:  02/27/21  Height:   Ht Readings from Last 1 Encounters:  02/18/21 5\' 7"  (1.702 m)    Weight:   Wt Readings from Last 1 Encounters:  02/18/21 121.2 kg    BMI:  Body mass index is 41.85 kg/m.  Estimated Nutritional Needs:   Kcal:  1700-1900  Protein:  90-100g  Fluid:  >1.7L  13/10/22, RDN, LDN Clinical Nutrition

## 2021-03-01 NOTE — Consult Note (Addendum)
WOC Nurse Consult Note: Ortho service is following for assessment and plan of care for leg wound.  Please refer to their team for further questions regarding plan of care.   Reason for Consult: Consult requested for buttocks and perineum.  Pt is frequently incontinent of urine and a Purewick is in place to attempt to contain urine.  Wound type: Buttocks and perineum and red and macerated; appearance is consistent with mild moisture associated skin damage.  Dressing procedure/placement/frequency: Topical treatment orders provided for bedside nurses to perform as follows to protect skin and repel moisture: Apply barrier cream and antifungal powder to buttocks and perineum with each turning and cleaning session. Please re-consult if further assistance is needed.  Thank-you,  Cammie Mcgee MSN, RN, CWOCN, [place], CNS [phone]

## 2021-03-01 NOTE — Progress Notes (Signed)
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 MRI done on 11/12 shows concern for infection in back at L4-5. PMH includes DM, HTN, obesity.   OT comments  Patient received in bed and agreeable to try [facility] for transfer. Patient was apprehensive about getting to EOB due to fear of increased back pain but once up stated she felt no increase in pain. Patient demonstrated less posterior leaning and improved sitting balance. Patient was instructed on Stedy use and was able to stand from EOB with min assist +2 following 2 attempts. Patient was able to maintain balance and sit back on pads to transfer to recliner where she was able to stand again with min assist and assisted to chair. Patient stated comfort once in chair. Acute OT to continue to follow.    Recommendations for follow up therapy are one component of a multi-disciplinary discharge planning process, led by the attending physician.  Recommendations may be updated based on patient status, additional functional criteria and insurance authorization.    Follow Up Recommendations  Acute inpatient rehab (3hours/day)    Assistance Recommended at Discharge Frequent or constant Supervision/Assistance  Equipment Recommendations  BSC/3in1;Wheelchair (measurements OT);Wheelchair cushion (measurements OT)    Recommendations for Other Services      Precautions / Restrictions Precautions Precautions: Fall Precaution Comments: mod fall Required Braces or Orthoses: Other Brace Other Brace: limb protector Restrictions Weight Bearing Restrictions: Yes LLE Weight Bearing: Non weight bearing       Mobility Bed Mobility Overal bed mobility: Needs Assistance Bed Mobility: Rolling;Sidelying to Sit Rolling:  Min guard Sidelying to sit: Mod assist       General bed mobility comments: patient fearful of back pain before attempting to get to EOB but was relieved that she did not have increased pain after sitting up    Transfers Overall transfer level: Needs assistance   Transfers: Bed to chair/wheelchair/BSC             General transfer comment: sit to stand performed from EOB with min assist +2 into stedy and min assist to stand from [employer] via Lift Equipment: Stedy   Balance Overall balance assessment: Needs assistance Sitting-balance support: Single extremity supported;Bilateral upper extremity supported Sitting balance-Leahy Scale: Fair Sitting balance - Comments: improved sitting balance with min guard and decreased complaints of pain   Standing balance support: Bilateral upper extremity supported Standing balance-Leahy Scale: Poor Standing balance comment: stood with Stedy                           ADL either performed or assessed with clinical judgement   ADL                                              Extremity/Trunk Assessment              Vision       Perception     Praxis      Cognition Arousal/Alertness: Awake/alert Behavior During Therapy: WFL for tasks assessed/performed Overall Cognitive Status: Within Functional Limits for tasks assessed Area of Impairment: Awareness;Following commands;Problem solving  Following Commands: Follows one step commands consistently Safety/Judgement: Decreased awareness of safety;Decreased awareness of deficits Awareness: Emergent Problem Solving: Decreased initiation;Requires verbal cues;Requires tactile cues General Comments: Patient agreeable to using stedy for transfer to recliner          Exercises     Shoulder Instructions       General Comments      Pertinent Vitals/ Pain       Pain Assessment: Faces Faces Pain Scale: Hurts  whole lot Pain Location: lower back pain Pain Descriptors / Indicators: Grimacing;Guarding Pain Intervention(s): Patient requesting pain meds-RN notified;Monitored during session;Repositioned  Home Living                                          Prior Functioning/Environment              Frequency  Min 2X/week        Progress Toward Goals  OT Goals(current goals can now be found in the care plan section)  Progress towards OT goals: Progressing toward goals  Acute Rehab OT Goals Patient Stated Goal: go to rehab OT Goal Formulation: With patient Time For Goal Achievement: 03/07/21 Potential to Achieve Goals: Good ADL Goals Pt Will Perform Grooming: with supervision;sitting Pt Will Perform Upper Body Dressing: with set-up;sitting Pt Will Perform Lower Body Dressing: with min assist;with caregiver independent in assisting;sitting/lateral leans Pt Will Transfer to Toilet: with mod assist;squat pivot transfer;bedside commode Pt Will Perform Toileting - Clothing Manipulation and hygiene: with min guard assist;sitting/lateral leans Additional ADL Goal #1: Pt will maintain seated balance EOB for participation in ADL at supervision level  Plan Discharge plan remains appropriate;Frequency remains appropriate    Co-evaluation    PT/OT/SLP Co-Evaluation/Treatment: Yes Reason for Co-Treatment: Necessary to address cognition/behavior during functional activity;For patient/therapist safety   OT goals addressed during session: Other (comment) (functional transfers)      AM-PAC OT "6 Clicks" Daily Activity     Outcome Measure   Help from another person eating meals?: A Little Help from another person taking care of personal grooming?: A Little Help from another person toileting, which includes using toliet, bedpan, or urinal?: A Lot Help from another person bathing (including washing, rinsing, drying)?: A Lot Help from another person to put on and taking off  regular upper body clothing?: A Lot Help from another person to put on and taking off regular lower body clothing?: A Lot 6 Click Score: 14    End of Session Equipment Utilized During Treatment: Gait belt;Other (comment) Antony Salmon)  OT Visit Diagnosis: Other abnormalities of gait and mobility (R26.89);Other symptoms and signs involving cognitive function;Pain Pain - Right/Left: Left Pain - part of body: Leg   Activity Tolerance Patient tolerated treatment well   Patient Left in chair;with call bell/phone within reach;with family/visitor present   Nurse Communication Mobility status;Need for lift equipment        Time: [phone] OT Time Calculation (min): 24 min  Charges: OT General Charges $OT Visit: 1 Visit OT Treatments $Therapeutic Activity: 8-22 mins  Alfonse Flavors, OTA Acute Rehabilitation Services  Pager [phone] Office [phone]   Dewain Penning 03/01/2021, 10:32 AM

## 2021-03-01 NOTE — Progress Notes (Signed)
Hospital day: 12  Subjective:  Lori Schmidt is a 45 y.o. female with a history of diabetes, hypertension, hyperlipidemia, and depression presenting with LLE osteomyelitis complicated by MSSA bacteremia and L4/L5 discitis.   Overnight event: None.  Patient seen at bedside during morning rounds. She is accompanied by her mother. Patient reports that the change to her medications has been helpful. She is still continuing to have intermittent back spasms, most recently last night, but they are tolerable. No leg pain. Eating well, most recent BM yesterday. We discussed placement options including CIR, SNF, and HHPT. She and her mother asked appropriate questions. No other complaints at this time.   Objective:  Vital signs in last 24 hours: Vitals:   02/28/21 0835 02/28/21 1652 02/28/21 1958 03/01/21 0539  BP: 116/77 113/80 108/71 118/66  Pulse: 92 91 92 75  Resp: 18 18  18   Temp: 98.2 F (36.8 C) 97.6 F (36.4 C) 98.9 F (37.2 C) 98.4 F (36.9 C)  TempSrc: Axillary Oral Oral Oral  SpO2: 97% 95% 96% 95%  Weight:      Height:        Filed Weights   02/18/21 0118  Weight: 121.2 kg     Intake/Output Summary (Last 24 hours) at 03/01/2021 0811 Last data filed at 03/01/2021 0555 Gross per 24 hour  Intake 704.38 ml  Output 900 ml  Net -195.62 ml   Net IO Since Admission: 776.41 mL [03/01/21 0811]  Recent Labs    02/28/21 1210 02/28/21 1648 02/28/21 1959  GLUCAP 126* 110* 97     Pertinent Labs: CBC Latest Ref Rng & Units 03/01/2021 02/27/2021 02/26/2021  WBC 4.0 - 10.5 K/uL 6.0 5.4 5.6  Hemoglobin 12.0 - 15.0 g/dL 9.0(L) 8.5(L) 8.4(L)  Hematocrit 36.0 - 46.0 % 28.8(L) 27.4(L) 27.6(L)  Platelets 150 - 400 K/uL 370 313 305   Imaging: MR Lumbar Spine W Wo Contrast  Result Date: 02/28/2021 CLINICAL DATA:  Low back pain, infection suspected EXAM: MRI LUMBAR SPINE WITHOUT AND WITH CONTRAST TECHNIQUE: Multiplanar and multiecho pulse sequences of the lumbar spine  were obtained without and with intravenous contrast. CONTRAST:  11mL GADAVIST GADOBUTROL 1 MMOL/ML IV SOLN COMPARISON:  MRI 02/20/2021 FINDINGS: Segmentation: Standard segmentation is assumed. Same numbering system as utilized on the previous report. Alignment:  Physiologic. Vertebrae: Redemonstrated edema and enhancement involving the inferior endplate of L4 and superior endplate of L5. The degree of signal changes at the endplates has slightly progressed compared to the previous MRI. No definite endplate erosion. There are a few foci of T2 hyperintense signal again seen within the L4-5 disc. No definite epidural involvement. Remaining intervertebral discs are within normal limits. No fracture. There are a few scattered intraosseous hemangiomas. Conus medullaris and cauda equina: Conus extends to the L1 level. Conus and cauda equina appear normal. Paraspinal and other soft tissues: No paravertebral inflammatory changes. Preserved signal within the psoas muscles. No paravertebral fluid collections. Disc levels: T12-L1: Unremarkable. L1-L2: Unremarkable. L2-L3: Mild disc bulge.  No foraminal or canal stenosis.  Unchanged. L3-L4: Mild diffuse disc bulge and mild bilateral facet arthropathy. No foraminal or canal stenosis. Unchanged. L4-L5: Diffuse disc bulge with small central/right paracentral protrusion. Mild bilateral facet arthropathy. Similar degree of subarticular recess stenosis, more pronounced on the right. Borderline-mild bilateral foraminal stenosis. No canal stenosis. Unchanged. L5-S1: Mild disc bulge without foraminal or canal stenosis. Unchanged. IMPRESSION: 1. Redemonstrated marrow edema and enhancement centered at the L4-5 level. The degree of signal changes at this level  has slightly progressed compared to the previous MRI. No definite endplate erosion or definite epidural involvement. Given the interval progression of signal changes, findings are favored to represent changes of early  discitis-osteomyelitis rather than discogenic endplate changes. 2. Unchanged mild multilevel degenerative changes of the lumbar spine, as detailed above. Electronically Signed   By: Duanne Guess D.O.   On: 02/28/2021 10:12    Physical Exam  General: Alert, laying in bed, NAD  CV: RRR, no murmurs heard  Pulmonary: Normal WOB, lungs clear anteriorly  Abdominal: Soft, nontender and nondistended  Extremities: L transtibial amputation with limb protector and prosthetic shrinker present. Intact sensation of right leg.  Skin: Warm, dry  Neuro: A&Ox3 Psych: Appropriate behavior and affect   Assessment/Plan: Lori Schmidt is a 45 y.o. female with hx of DM, HTN, HLD, and depression presenting with LLE osteomyelitis POD 10 s/p L transtibial amputation complicated by MSSA bacteremia and L4-L5 discitis.   Active Problems:   Bacteremia due to Staphylococcus aureus   Diabetes mellitus (HCC)   Left leg cellulitis   Subacute osteomyelitis, left ankle and foot (HCC)   Discitis of lumbar region   Pressure injury of skin  #LLE osteomyelitis s/p L transtibial amputation  #L4-L5 discitis/osteomyelitis  #MSSA bacteremia  Patient has remained afebrile and without leukocytosis. Repeat lumbar MRI largely unchanged, still consistent with discitis/osteomyelitis but no indication of worsening infection. Pain  improved with scheduled Oxycontin 15mg  q12 hr and more frequent Robaxin. Will begin to wean oxycodone from 15mg  to 10mg  PRN. When discussing the option of home health PT, patient's mother voices safety concerns and states that there is not adequate support at home to assist in transferring patient from bed to chair or to help if patient falls. Patient's mother does not wish to pursue [facility] as they prefer a more local option where they are able to visit often. Patient's mother resides in [place]. Patient and family prefer CIR here which PT/OT are recommending but patient was previously  denied. Rehab admissions coordinator is now reaching out to PT/OT to further discuss CIR.  -Continue IV Ancef, end 04/14/2021 -Continue Robaxin 750mg  po q6h -Continue Oxycontin 15mg  po q12h  -Decrease oxycodone to 10mg  po q4h PRN pain  -PT/OT following  -F/u w/ case management & rehab coordinator   #Type II diabetes mellitus  CBGs have been well controlled on current regimen, ranging from 97-126.  -Continue metformin 500mg  po daily, Tradjenta 5mg  po daily  -Continue Levemir 55U daily, Novolog 8U TID with meals, SSI with meals and at bedtime  -Continue routine CBG monitoring   #Hypertension  #Hyperlipidemia  -Continue pravastatin 40mg  po daily -Currently normotensive, holding home Losartan but will resume if patient becomes hypertension  #Depression  Continue Celexa 40mg  po daily   Diet: Carb-modified  IVF: None  VTE: Enoxaparin  CODE: Full  Prior to Admission Living Arrangement: Home   Anticipated Discharge Location: CIR vs SNF Barriers to Discharge: Pending placement Dispo: Anticipated discharge to CIR, pending placement  Signed: , Medical Student 03/01/2021, 8:11 AM  Internal Medicine Teaching Service [phone] Please contact the on call pager after 5 pm and on weekends at [phone].

## 2021-03-01 NOTE — Progress Notes (Signed)
Inpatient Rehab Admissions Coordinator:   I do not have a bed for this pt. Today. She does demonstrate progress with therapies.I will reach out to therapists to discuss her ability to tolerate the intensity of CIR as she has significant limitations due to pain.   Megan Salon, MS, CCC-SLP Rehab Admissions Coordinator  [phone] (celll) [phone] (office)

## 2021-03-02 DIAGNOSIS — R7881 Bacteremia: Secondary | ICD-10-CM | POA: Diagnosis not present

## 2021-03-02 DIAGNOSIS — E1169 Type 2 diabetes mellitus with other specified complication: Secondary | ICD-10-CM | POA: Diagnosis not present

## 2021-03-02 DIAGNOSIS — M4646 Discitis, unspecified, lumbar region: Secondary | ICD-10-CM | POA: Diagnosis not present

## 2021-03-02 DIAGNOSIS — M86272 Subacute osteomyelitis, left ankle and foot: Secondary | ICD-10-CM | POA: Diagnosis not present

## 2021-03-02 LAB — COMPREHENSIVE METABOLIC PANEL
ALT: 9 U/L (ref 0–44)
AST: 13 U/L — ABNORMAL LOW (ref 15–41)
Albumin: 2.1 g/dL — ABNORMAL LOW (ref 3.5–5.0)
Alkaline Phosphatase: 98 U/L (ref 38–126)
Anion gap: 9 (ref 5–15)
BUN: 15 mg/dL (ref 6–20)
CO2: 28 mmol/L (ref 22–32)
Calcium: 8.6 mg/dL — ABNORMAL LOW (ref 8.9–10.3)
Chloride: 100 mmol/L (ref 98–111)
Creatinine, Ser: 0.45 mg/dL (ref 0.44–1.00)
GFR, Estimated: >60 mL/min (ref >60)
Glucose, Bld: 105 mg/dL — ABNORMAL HIGH (ref 70–99)
Potassium: 4 mmol/L (ref 3.5–5.1)
Sodium: 137 mmol/L (ref 135–145)
Total Bilirubin: 0.8 mg/dL (ref 0.3–1.2)
Total Protein: 6.4 g/dL — ABNORMAL LOW (ref 6.5–8.1)

## 2021-03-02 LAB — GLUCOSE, CAPILLARY: Glucose-Capillary: 98 mg/dL (ref 70–99)

## 2021-03-02 MED ORDER — NAPROXEN 250 MG PO TABS
500.0000 mg | ORAL_TABLET | Freq: Two times a day (BID) | ORAL | Status: DC
  Administered 2021-03-02 – 2021-03-05 (×8): 500 mg via ORAL
  Filled 2021-03-02 (×8): qty 2

## 2021-03-02 MED ORDER — OXYCODONE HCL 5 MG PO TABS
10.0000 mg | ORAL_TABLET | Freq: Three times a day (TID) | ORAL | Status: DC | PRN
  Administered 2021-03-03: 10 mg via ORAL
  Filled 2021-03-02 (×2): qty 2

## 2021-03-02 NOTE — Progress Notes (Signed)
Patient FSBS last night was 93 but did not cross over the system we continue to monitor.

## 2021-03-02 NOTE — Progress Notes (Signed)
Inpatient Rehab Admissions Coordinator:   I met with Pt. And mother regarding potential CIR admit. They state interest and Pt. Feels she could tolerate the intensity now. PT and OT both feel she could likely tolerate. Pt.'s parents can provide 24/7 support at d/c. I will open a case for insurance auth and pursue for potential admit pending bed availability and insurance auth.   Clemens Catholic, [place], [place] Admissions Coordinator  [phone] ([place]) [phone] (office)

## 2021-03-02 NOTE — Progress Notes (Addendum)
Hospital day: 13  Subjective:  Lori Schmidt is a 45 y.o. female with a history of DM, HTN, HLD, and depression presenting with LLE osteomyelitis s/p L transtibial amputation complicated by MSSA bacteremia and L4/L5 discitis.   Overnight event: None.  Patient seen at bedside during morning rounds. She is accompanied by her mother. Patient reports that she did not sleep well due to being woken up by back pain. Her mother states that she was very active yesterday and sat in the chair for 90 min. Patient and mother were informed about the potential for CIR placement.   Objective:  Vital signs in last 24 hours: Vitals:   03/01/21 2058 03/01/21 2058 03/02/21 0425 03/02/21 0426  BP: 106/68 106/68 105/70 105/70  Pulse: 95 94 87 90  Resp: 19 19  18   Temp: 98.3 F (36.8 C) 98.3 F (36.8 C)  98.2 F (36.8 C)  TempSrc: Oral Oral  Oral  SpO2: 95% 93% 96% 98%  Weight:      Height:        Filed Weights   02/18/21 0118  Weight: 121.2 kg    Intake/Output Summary (Last 24 hours) at 03/02/2021 0739 Last data filed at 03/02/2021 0610 Gross per 24 hour  Intake 306.06 ml  Output 1100 ml  Net -793.94 ml   Net IO Since Admission: -17.53 mL [03/02/21 0739]   Pertinent Labs: CBG (last 3)  Recent Labs    03/01/21 0826 03/01/21 1119 03/02/21 0633  GLUCAP 116* 89 98   Physical Exam General: Alert, laying in bed, tearful at times CV: RRR, no murmurs heard  Pulmonary: Lungs CTAB, normal WOB  Abdominal: Soft, non-tender and non-distended Extremities: L transtibial amputation with limb protector and prosthetic shrinker present. No lower extremity edema  Skin: Warm, dry  Neuro: A&Ox3, no focal deficits noted  Psych: Appropriate behavior and affect   Assessment/Plan: Lori Schmidt is a 45 y.o. female with hx of DM, HTN, HLD, and depression presenting with LLE osteomyelitis s/p L transtibial amputation complicated by MSSA bacteremia and L4/L5 discitis.   Active  Problems:   Bacteremia due to Staphylococcus aureus   Diabetes mellitus (HCC)   Left leg cellulitis   Subacute osteomyelitis, left ankle and foot (HCC)   Discitis of lumbar region   Pressure injury of skin  #LLE osteomyelitis s/p L transtibial amputation  #MSSA bacteremia  #Discitis  Patient still experiencing back pain 2/2 discitis. We discussed that this pain is not likely to resolve all at once and that more realistic pain goals would be 4-6/10, acknowledging that pain will still be present but the medications will help it be more bearable, allowing for therapies and improved functionality. We will schedule an NSAID to help with the pain associated with inflammation. During the course of her hospitalization, patient has demonstrated progress with therapies and improved pain tolerance. Now pursuing CIR here, pending bed availability and insurance auth. Continuing to wean PRN oxycodone.  -IV Ancef, end 04/14/21  -Robaxin 750mg  po q6h  -Oxycontin 15mg  po q12h  -Start Naproxon 500mg  po BID  -Oxycodone 10mg  po q8h PRN pain (decreased from q4h)  -PT/OT following -Pending CIR   #Type 2 diabetes mellitus  CBGs have been appropriate, ranging from 89-116 in the last 24 hours. Will continue current regimen.  -Levemir 55U qhs -Novolog 8U TID with meals  -Tradjenta 5mg  po daily, metformin 500mg  po daily  -SSI with meals and at nighttime  -Routine CBG monitoring   #HLD #HTN -Continue pravastatin  40mg  po daily  -Hold home Losartan as patient has been normotensive  #Depression  Continue Celexa 40mg  po daily.    Diet: Carb-modified  IVF: None VTE: Enoxaparin  CODE: Full  Prior to Admission Living Arrangement:  Home Anticipated Discharge Location: CIR Barriers to Discharge: Bed availability, insurance auth Dispo: Anticipated discharge to CIR   Signed: , Medical Student 03/02/2021, 7:39 AM  Internal Medicine Teaching Service [phone] Please contact the on call  pager after 5 pm and on weekends at [phone].

## 2021-03-02 NOTE — Progress Notes (Signed)
Physical Therapy Treatment Patient Details Name: Lori Schmidt MRN: [id number] DOB: 1976-01-02 Today's Date: 03/02/2021   History of Present Illness Patient is a 45 y/o female who presents with extensive infection of LLE secondary to MSSA bacteremia now s/p Left BKA 02/19/21. MRI done on 11/12 shows concern for infection in back at L4-5. PMH includes DM, HTN, obesity.    PT Comments    Pt admitted with above diagnosis. Pt was able to stand to [facility] with min assist of 2 x 3 attempts for up to 1 min each. Pt requires max cues for technique. Pt fearful and does have incr back pain at times. Progressing each day.  Pt currently with functional limitations due to balance and endurance deficits. Pt will benefit from skilled PT to increase their independence and safety with mobility to allow discharge to the venue listed below.      Recommendations for follow up therapy are one component of a multi-disciplinary discharge planning process, led by the attending physician.  Recommendations may be updated based on patient status, additional functional criteria and insurance authorization.  Follow Up Recommendations  Acute inpatient rehab (3hours/day)     Assistance Recommended at Discharge Frequent or constant Supervision/Assistance  Equipment Recommendations  Wheelchair (measurements PT);Wheelchair cushion (measurements PT)    Recommendations for Other Services Rehab consult     Precautions / Restrictions Precautions Precautions: Fall Precaution Comments: mod fall Required Braces or Orthoses: Other Brace Other Brace: limb protector Restrictions LLE Weight Bearing: Non weight bearing     Mobility  Bed Mobility Overal bed mobility: Needs Assistance Bed Mobility: Rolling;Sidelying to Sit Rolling: Min guard Sidelying to sit: Mod assist     Sit to sidelying: +2 for safety/equipment;+2 for physical assistance;Mod assist General bed mobility comments: patient fearful of back pain  before attempting to get to EOB but was relieved that she did not have increased pain after sitting up    Transfers Overall transfer level: Needs assistance   Transfers: Bed to chair/wheelchair/BSC;Sit to/from Stand Sit to Stand: Min assist;+2 safety/equipment;From elevated surface           General transfer comment: sit to stand performed from EOB with min assist +2 into stedy and min assist to stand from [place] pads.  Practiced stands x 3 standing up to 1 min each attempt.  Then moved pt to chair in the [place] Transfer via Lift Equipment: Stedy  Ambulation/Gait               General Gait Details: unable to stand   Stairs             Wheelchair Mobility    Modified Rankin (Stroke Patients Only)       Balance Overall balance assessment: Needs assistance Sitting-balance support: Single extremity supported;Bilateral upper extremity supported Sitting balance-Leahy Scale: Fair Sitting balance - Comments: improved sitting balance with min guard and decreased complaints of pain Postural control: Posterior lean Standing balance support: Bilateral upper extremity supported Standing balance-Leahy Scale: Poor Standing balance comment: stood with Antony Salmon                            Cognition Arousal/Alertness: Awake/alert Behavior During Therapy: WFL for tasks assessed/performed Overall Cognitive Status: Within Functional Limits for tasks assessed Area of Impairment: Awareness;Following commands;Problem solving                       Following Commands: Follows one step commands consistently Safety/Judgement:  Decreased awareness of safety;Decreased awareness of deficits Awareness: Emergent Problem Solving: Decreased initiation;Requires verbal cues;Requires tactile cues General Comments: Patient agreeable to using stedy for transfer to recliner        Exercises General Exercises - Lower Extremity Long Arc Quad: AROM;Left;10 reps;Seated Hip  ABduction/ADduction: AROM;Left;10 reps;Seated Straight Leg Raises: AROM;10 reps;Left;Seated Amputee Exercises Quad Sets: AROM;Left;5 reps;Supine    General Comments        Pertinent Vitals/Pain Pain Assessment: Faces Faces Pain Scale: Hurts even more Pain Location: lower back pain Pain Descriptors / Indicators: Grimacing;Guarding Pain Intervention(s): Limited activity within patient's tolerance;Monitored during session;Repositioned    Home Living                          Prior Function            PT Goals (current goals can now be found in the care plan section) Acute Rehab PT Goals Patient Stated Goal: decrease pain Progress towards PT goals: Progressing toward goals    Frequency    Min 3X/week      PT Plan Current plan remains appropriate    Co-evaluation              AM-PAC PT "6 Clicks" Mobility   Outcome Measure  Help needed turning from your back to your side while in a flat bed without using bedrails?: A Little Help needed moving from lying on your back to sitting on the side of a flat bed without using bedrails?: A Little Help needed moving to and from a bed to a chair (including a wheelchair)?: Total Help needed standing up from a chair using your arms (e.g., wheelchair or bedside chair)?: Total Help needed to walk in hospital room?: Total Help needed climbing 3-5 steps with a railing? : Total 6 Click Score: 10    End of Session Equipment Utilized During Treatment: Gait belt Activity Tolerance: Patient limited by fatigue;Patient limited by pain Patient left: in chair;with chair alarm set;with family/visitor present;with call bell/phone within reach Nurse Communication: Mobility status;Patient requests pain meds PT Visit Diagnosis: Other abnormalities of gait and mobility (R26.89);Pain Pain - Right/Left: Left Pain - part of body: Leg     Time: 9563-8756 PT Time Calculation (min) (ACUTE ONLY): 23 min  Charges:  $Therapeutic  Exercise: 8-22 mins $Therapeutic Activity: 8-22 mins                     Kalim Kissel M,PT Acute Rehab Services [phone] (pager)    Bevelyn Buckles 03/02/2021, 12:02 PM

## 2021-03-03 DIAGNOSIS — F4323 Adjustment disorder with mixed anxiety and depressed mood: Secondary | ICD-10-CM

## 2021-03-03 LAB — BASIC METABOLIC PANEL
Anion gap: 9 (ref 5–15)
BUN: 22 mg/dL — ABNORMAL HIGH (ref 6–20)
CO2: 27 mmol/L (ref 22–32)
Calcium: 8.4 mg/dL — ABNORMAL LOW (ref 8.9–10.3)
Chloride: 102 mmol/L (ref 98–111)
Creatinine, Ser: 0.32 mg/dL — ABNORMAL LOW (ref 0.44–1.00)
GFR, Estimated: >60 mL/min (ref >60)
Glucose, Bld: 167 mg/dL — ABNORMAL HIGH (ref 70–99)
Potassium: 3.9 mmol/L (ref 3.5–5.1)
Sodium: 138 mmol/L (ref 135–145)

## 2021-03-03 LAB — CBC
HCT: 31 % — ABNORMAL LOW (ref 36.0–46.0)
Hemoglobin: 9.7 g/dL — ABNORMAL LOW (ref 12.0–15.0)
MCH: 27.8 pg (ref 26.0–34.0)
MCHC: 31.3 g/dL (ref 30.0–36.0)
MCV: 88.8 fL (ref 80.0–100.0)
Platelets: 419 10*3/uL — ABNORMAL HIGH (ref 150–400)
RBC: 3.49 MIL/uL — ABNORMAL LOW (ref 3.87–5.11)
RDW: 12.6 % (ref 11.5–15.5)
WBC: 7.8 10*3/uL (ref 4.0–10.5)
nRBC: 0 % (ref 0.0–0.2)

## 2021-03-03 LAB — GLUCOSE, CAPILLARY
Glucose-Capillary: 116 mg/dL — ABNORMAL HIGH (ref 70–99)
Glucose-Capillary: 158 mg/dL — ABNORMAL HIGH (ref 70–99)
Glucose-Capillary: 128 mg/dL — ABNORMAL HIGH (ref 70–99)
Glucose-Capillary: 149 mg/dL — ABNORMAL HIGH (ref 70–99)

## 2021-03-03 MED ORDER — ZOLPIDEM TARTRATE 5 MG PO TABS
5.0000 mg | ORAL_TABLET | Freq: Every day | ORAL | Status: AC
  Administered 2021-03-03: 5 mg via ORAL
  Filled 2021-03-03: qty 1

## 2021-03-03 MED ORDER — OXYCODONE HCL ER 10 MG PO T12A
10.0000 mg | EXTENDED_RELEASE_TABLET | Freq: Two times a day (BID) | ORAL | Status: DC
  Administered 2021-03-03 – 2021-03-05 (×4): 10 mg via ORAL
  Filled 2021-03-03 (×4): qty 1

## 2021-03-03 NOTE — Progress Notes (Signed)
   03/03/21 1100  Clinical Encounter Type  Visited With Patient  Visit Type Follow-up;Psychological support;Spiritual support;Social support;Post-op  Referral From Nurse  Spiritual Encounters  Spiritual Needs Emotional  Stress Factors  Patient Stress Factors Major life changes;Loss of control;Exhausted    CH visited pt. per [facility] consult for support in light of a major life transition; pt. is known to [facility] from prior visit earlier this month.  Pt. lying in bed when [facility] arrived; she shared that earlier this AM she was feeling very emotional d/t the difficulties of being hospitalized during the Thanksgiving holidays, feeling isolated from family when she originally would have been very involved in hosting a family meal.  Pt. said several times that as this morning went on she felt better and "got my head on straight" but she acknowledges the challenges before her in light of wanting to be able to return to her normal activities with an amputation.  Pt. shared that work had been very busy this year and that she feels her hectic pace of life may have contributed to developing the infection that necessitated her amputation.  CH encouraged pt. to exercise kindness towards herself if possible.  Pt. is eager to begin intensive inpatient rehab at [facility]; she hopes to be discharged home in three weeks.  CH provided extended active listening and supportive presence and affirmation of emotions; with patient's permission CH offered prayer for continued healing and sense of divine presence for pt.  Chaplains remain available as needed.

## 2021-03-03 NOTE — Progress Notes (Addendum)
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 MRI done on 11/12 shows concern for infection in back at L4-5. PMH includes DM, HTN, obesity.    PT Comments    Pt admitted with above diagnosis. Pt was able to stand to RW today with +2 min to mod assist depending on the height of the surface. Pt pivoted to chair with +2 min to mod assist. Pt progressing and continues to be motivated.  Pt met 5/6 goals and goals revised today.  Noted AIR approved but doesn't have bed.   Pt currently with functional limitations due to balance and endurance deficits. Pt will benefit from skilled PT to increase their independence and safety with mobility to allow discharge to the venue listed below.      Recommendations for follow up therapy are one component of a multi-disciplinary discharge planning process, led by the attending physician.  Recommendations may be updated based on patient status, additional functional criteria and insurance authorization.  Follow Up Recommendations  Acute inpatient rehab (3hours/day)     Assistance Recommended at Discharge Frequent or constant Supervision/Assistance  Equipment Recommendations  Wheelchair (measurements PT);Wheelchair cushion (measurements PT)    Recommendations for Other Services Rehab consult     Precautions / Restrictions Precautions Precautions: Fall Precaution Comments: mod fall Required Braces or Orthoses: Other Brace Other Brace: limb protector Restrictions LLE Weight Bearing: Non weight bearing     Mobility  Bed Mobility Overal bed mobility: Needs Assistance Bed Mobility: Rolling;Sidelying to Sit Rolling: Min guard Sidelying to sit: Min assist       General bed mobility comments: Pt able to come to EOB  with directional cues and use of rail.    Transfers Overall transfer level: Needs assistance Equipment used: Rolling walker (2 wheels) Transfers: Bed to chair/wheelchair/BSC;Sit to/from Stand Sit to Stand: Min assist;From elevated surface;Mod assist;+2 physical assistance Stand pivot transfers: Min assist;+2 safety/equipment         General transfer comment: sit to stand performed from EOB with min assist +2 wityh bed raised to RW with one hand on RW and one hand on bed.  Pt able to pivot to chair sliding foot to her right.  Did not reach back for chair and needed assist to control descent.  Stood from the chair to the RW with +2 mod assist with pt having to give more effort from lower surface. Pt did reach back to chair after this attempt.    Ambulation/Gait                   Stairs             Wheelchair Mobility    Modified Rankin (Stroke Patients Only)       Balance Overall balance assessment: Needs assistance Sitting-balance support: Single extremity supported;Bilateral upper extremity supported Sitting balance-Leahy Scale: Fair Sitting balance - Comments: improved sitting balance with min guard and decreased complaints of pain Postural control: Posterior lean Standing balance support: Bilateral upper extremity supported Standing balance-Leahy Scale: Poor Standing balance comment: Stood to RW with +2 min to mod assist with min assist for static stance and mod of 2 for dynamic activity.  Cognition Arousal/Alertness: Awake/alert Behavior During Therapy: WFL for tasks assessed/performed Overall Cognitive Status: Within Functional Limits for tasks assessed Area of Impairment: Awareness;Following commands;Problem solving                       Following Commands: Follows one step commands consistently Safety/Judgement: Decreased awareness of safety;Decreased awareness of deficits Awareness: Emergent Problem  Solving: Decreased initiation;Requires verbal cues;Requires tactile cues          Exercises General Exercises - Lower Extremity Long Arc Quad: AROM;Left;10 reps;Seated Hip ABduction/ADduction: AROM;Left;10 reps;Seated Straight Leg Raises: AROM;10 reps;Left;Seated Amputee Exercises Quad Sets: AROM;Left;5 reps;Supine    General Comments        Pertinent Vitals/Pain Pain Assessment: Faces Faces Pain Scale: Hurts even more Pain Location: lower back pain Pain Descriptors / Indicators: Grimacing;Guarding Pain Intervention(s): Limited activity within patient's tolerance;Monitored during session;Repositioned    Home Living                          Prior Function            PT Goals (current goals can now be found in the care plan section) Acute Rehab PT Goals Patient Stated Goal: decrease pain PT Goal Formulation: With patient Time For Goal Achievement: 03/17/21 Potential to Achieve Goals: Fair Progress towards PT goals: Progressing toward goals    Frequency    Min 3X/week      PT Plan Current plan remains appropriate    Co-evaluation              AM-PAC PT "6 Clicks" Mobility   Outcome Measure  Help needed turning from your back to your side while in a flat bed without using bedrails?: A Little Help needed moving from lying on your back to sitting on the side of a flat bed without using bedrails?: A Little Help needed moving to and from a bed to a chair (including a wheelchair)?: Total Help needed standing up from a chair using your arms (e.g., wheelchair or bedside chair)?: Total Help needed to walk in hospital room?: Total Help needed climbing 3-5 steps with a railing? : Total 6 Click Score: 10    End of Session Equipment Utilized During Treatment: Gait belt Activity Tolerance: Patient limited by fatigue;Patient limited by pain Patient left: in chair;with chair alarm set;with call bell/phone within reach Nurse Communication: Mobility  status;Patient requests pain meds PT Visit Diagnosis: Other abnormalities of gait and mobility (R26.89);Pain Pain - Right/Left: Left Pain - part of body: Leg     Time: 1859-0931 PT Time Calculation (min) (ACUTE ONLY): 17 min  Charges:  $Therapeutic Activity: 8-22 mins                     Iyari Hagner M,PT Acute Rehab Services [phone] (pager)    Alvira Philips 03/03/2021, 9:47 AM

## 2021-03-03 NOTE — Progress Notes (Signed)
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 MRI done on 11/12 shows concern for infection in back at L4-5. PMH includes DM, HTN, obesity.   OT comments  Patient received in recliner and stated she would like to get back to bed due to back pain. Patient required 2 attempts to stand from recliner with mod assist +2.  Patient performed stand pivot transfer to EOB with mod assist +2.  Once on EOB patient asked to remain sitting on EOB and asked for setup for grooming. Nursing notified on patient's progress and location. Acute OT to continue to follow.    Recommendations for follow up therapy are one component of a multi-disciplinary discharge planning process, led by the attending physician.  Recommendations may be updated based on patient status, additional functional criteria and insurance authorization.    Follow Up Recommendations  Acute inpatient rehab (3hours/day)    Assistance Recommended at Discharge Frequent or constant Supervision/Assistance  Equipment Recommendations  BSC/3in1;Wheelchair (measurements OT);Wheelchair cushion (measurements OT)    Recommendations for Other Services      Precautions / Restrictions Precautions Precautions: Fall Precaution Comments: mod fall Required Braces or Orthoses: Other Brace Other Brace: limb protector Restrictions Weight Bearing Restrictions: Yes LLE Weight Bearing: Non weight bearing       Mobility Bed Mobility Overal bed mobility: Needs Assistance Bed Mobility: Rolling;Sidelying to Sit Rolling: Min guard Sidelying to sit: Min assist       General bed mobility comments: Pt able to come to EOB with directional cues and use of rail.    Transfers Overall transfer level: Needs  assistance Equipment used: Rolling walker (2 wheels) Transfers: Sit to/from Stand Sit to Stand: Mod assist;+2 physical assistance Stand pivot transfers: Mod assist;+2 safety/equipment         General transfer comment: patient stood from recliner with mod assist +2 with on second attempt and performed stand pivot transfer to EOB     Balance Overall balance assessment: Needs assistance Sitting-balance support: Single extremity supported;Bilateral upper extremity supported Sitting balance-Leahy Scale: Fair Sitting balance - Comments: patient asked to remain sitting on EOB with setup for grooming Postural control: Posterior lean Standing balance support: Bilateral upper extremity supported Standing balance-Leahy Scale: Poor Standing balance comment: stood with RW for transfer to EOB                           ADL either performed or assessed with clinical judgement   ADL                                              Extremity/Trunk Assessment Upper Extremity Assessment LUE Coordination: WNL            Vision       Perception     Praxis      Cognition Arousal/Alertness: Awake/alert Behavior During Therapy: WFL for tasks assessed/performed Overall Cognitive Status: Within Functional Limits for tasks assessed Area of Impairment: Awareness;Following commands;Problem solving                       Following Commands: Follows one step commands consistently Safety/Judgement: Decreased  awareness of safety;Decreased awareness of deficits Awareness: Emergent Problem Solving: Decreased initiation;Requires verbal cues;Requires tactile cues General Comments: patient more calm with transfrs and exhibits less anxiety          Exercises Exercises: General Lower Extremity General Exercises - Lower Extremity Long Arc Quad: AROM;Left;10 reps;Seated Hip ABduction/ADduction: AROM;Left;10 reps;Seated Straight Leg Raises: AROM;10  reps;Left;Seated Amputee Exercises Quad Sets: AROM;Left;5 reps;Supine   Shoulder Instructions       General Comments      Pertinent Vitals/ Pain       Pain Assessment: Faces Faces Pain Scale: Hurts even more Pain Location: lower back pain Pain Descriptors / Indicators: Grimacing;Guarding Pain Intervention(s): Limited activity within patient's tolerance;Monitored during session;Repositioned  Home Living                                          Prior Functioning/Environment              Frequency  Min 2X/week        Progress Toward Goals  OT Goals(current goals can now be found in the care plan section)  Progress towards OT goals: Progressing toward goals  Acute Rehab OT Goals Patient Stated Goal: get back home OT Goal Formulation: With patient Time For Goal Achievement: 03/07/21 Potential to Achieve Goals: Good ADL Goals Pt Will Perform Grooming: with supervision;sitting Pt Will Perform Upper Body Dressing: with set-up;sitting Pt Will Perform Lower Body Dressing: with min assist;with caregiver independent in assisting;sitting/lateral leans Pt Will Transfer to Toilet: with mod assist;squat pivot transfer;bedside commode Pt Will Perform Toileting - Clothing Manipulation and hygiene: with min guard assist;sitting/lateral leans Additional ADL Goal #1: Pt will maintain seated balance EOB for participation in ADL at supervision level  Plan Discharge plan remains appropriate;Frequency remains appropriate    Co-evaluation                 AM-PAC OT "6 Clicks" Daily Activity     Outcome Measure   Help from another person eating meals?: A Little Help from another person taking care of personal grooming?: A Little Help from another person toileting, which includes using toliet, bedpan, or urinal?: A Lot Help from another person bathing (including washing, rinsing, drying)?: A Lot Help from another person to put on and taking off regular upper  body clothing?: A Lot Help from another person to put on and taking off regular lower body clothing?: A Lot 6 Click Score: 14    End of Session Equipment Utilized During Treatment: Gait belt;Rolling walker (2 wheels)  OT Visit Diagnosis: Other abnormalities of gait and mobility (R26.89);Other symptoms and signs involving cognitive function;Pain Pain - Right/Left: Left Pain - part of body: Leg   Activity Tolerance Patient tolerated treatment well   Patient Left in chair;with call bell/phone within reach;with bed alarm set   Nurse Communication Mobility status;Other (comment) (informed nursing back was back in bed and seated on EOB)        Time: 1000-1017 OT Time Calculation (min): 17 min  Charges: OT General Charges $OT Visit: 1 Visit OT Treatments $Therapeutic Activity: 8-22 mins  Alfonse Flavors, OTA Acute Rehabilitation Services  Pager [phone] Office [phone]   Dewain Penning 03/03/2021, 11:56 AM

## 2021-03-03 NOTE — Progress Notes (Signed)
Inpatient Rehab Admissions Coordinator:   I received insurance auth for CIR from [place] but do not have a bed for this pt. Today or tomorrow. I will continue to follow for potential admit pending bed availability.   Megan Salon, MS, CCC-SLP Rehab Admissions Coordinator  [phone] (celll) [phone] (office)

## 2021-03-03 NOTE — Progress Notes (Addendum)
Hospital day: 14  Subjective:  Lori Schmidt is a 45 y.o. female with a history of DM, HTN, HLD, and depression who presents with LLE osteomyelitis s/p L transtibial amputation complicated by MSSA bacteremia and L4/L5 discitis.   Overnight event: None  Patient seen at bedside during morning rounds. She states that her back ain is better. Having some nausea, Zofran helps. She reports missing her children and poor sleep. She has been staying awake at night, thinking about the holidays and her family.  Objective:  Vital signs in last 24 hours: Vitals:   03/02/21 2052 03/02/21 2054 03/03/21 0452 03/03/21 0744  BP: 107/69 107/69 118/70 121/73  Pulse: 95 95 96 95  Resp:    19  Temp:  98.1 F (36.7 C) 98 F (36.7 C) 98 F (36.7 C)  TempSrc:  Oral Oral Oral  SpO2: 96% 98% 98% 97%  Weight:      Height:        Filed Weights   02/18/21 0118  Weight: 121.2 kg     Intake/Output Summary (Last 24 hours) at 03/03/2021 1024 Last data filed at 03/03/2021 0853 Gross per 24 hour  Intake 1580 ml  Output 200 ml  Net 1380 ml   Net IO Since Admission: 702.47 mL [03/03/21 1024]  Recent Labs    03/01/21 1119 03/02/21 0633 03/03/21 0745  GLUCAP 89 98 116*     Pertinent Labs: CBC Latest Ref Rng & Units 03/03/2021 03/01/2021 02/27/2021  WBC 4.0 - 10.5 K/uL 7.8 6.0 5.4  Hemoglobin 12.0 - 15.0 g/dL 9.6(V) 8.9(F) 8.1(O)  Hematocrit 36.0 - 46.0 % 31.0(L) 28.8(L) 27.4(L)  Platelets 150 - 400 K/uL 419(H) 370 313    BMP Latest Ref Rng & Units 03/03/2021 03/02/2021 02/26/2021  Glucose 70 - 99 mg/dL 175(Z) 025(E) 527(P)  BUN 6 - 20 mg/dL 82(U) 15 11  Creatinine 0.44 - 1.00 mg/dL 2.35(T) 6.14 4.31  Sodium 135 - 145 mmol/L 138 137 137  Potassium 3.5 - 5.1 mmol/L 3.9 4.0 4.2  Chloride 98 - 111 mmol/L 102 100 105  CO2 22 - 32 mmol/L 27 28 23   Calcium 8.9 - 10.3 mg/dL ) 5.4(M) 7.8(L)    Physical Exam  General: Alert, laying in bed, tearful  CV: RRR, no murmurs heard   Pulmonary: Lungs clear anteriorly, normal WOB Abdominal: Soft, non-tender, non-distended  Extremities: L transtibial amputation, intact sensation on L and R extremities, no edema  Skin: Warm, dry  Neuro: A&Ox3  Psych: Sad affect, appropriate behavior    Assessment/Plan: [name] is a 45 y.o. female with hx of DM, HTN, HLD, and depression presenting with LLE osteomyelitis s/p L transtibial amputation complicated by MSSA bacteremia and L4/L5 discitis.   Active Problems:   Bacteremia due to Staphylococcus aureus   Diabetes mellitus (HCC)   Left leg cellulitis   Subacute osteomyelitis, left ankle and foot (HCC)   Discitis of lumbar region   Pressure injury of skin  #LLE osteomyelitis s/p L transtibial amputation #MSSA bacteremia #L4-5 Discitis  Pain from discitis improving. Only required 10mg  of PRN oxycodone yesterday. Will wean scheduled Oxycontin from 15mg  q12h to 10mg  q12h. Insurance has approved CIR, now pending available bed here.  -IV Ancef, end 04/14/21 -Oxycontin 10mg  po q12h -Robaxin 750mg  po q6h  -Naproxen 500mg  po BID  -Oxycodone 10mg  po q8h PRN  -PT/OT following  -Pending CIR  #Type 2 DM  CBGs well-controlled, ranging from 116-167. Continue current regimen.  -Levemir 55U qhs -Novolog 8U TID  with meals  -Tradjenta 5mg  po daily, metformin 500mg  po daily  -SSI with meals and at nighttime  -Routine CBG monitoring   #Depression / Adjustment Disorder # Insomnia due to above Notes that her thoughts about her family and missing the holiday keep her awake at night. Will start her on Zolpidem qhs to help with sleep while she is here in the hospital and have the chaplain speak with her for support.  -Zolpidem 5mg  po tonight  -Continue Celexa 40mg  po daily   Diet: Carb modified  IVF: None  VTE: Enoxaparin  CODE: Full  Prior to Admission Living Arrangement: Home Anticipated Discharge Location: CIR Barriers to Discharge: Pending CIR bed availability   Dispo: Anticipated discharge as soon as CIR bed is available   Signed: Stefani Dama, Medical Student 03/03/2021, 10:24 AM  Internal Medicine Teaching Service [phone] Please contact the on call pager after 5 pm and on weekends at [phone].

## 2021-03-03 NOTE — Progress Notes (Signed)
Mobility Specialist Progress Note:   03/03/21 1000  Mobility  Activity Transferred:  Chair to bed  Level of Assistance Moderate assist, patient does 50-74% (+2)  Assistive Device Front wheel walker  Distance Ambulated (ft) 3 ft  Mobility Response Tolerated well  Mobility performed by Mobility specialist;Other (comment) (OT)  $Mobility charge 1 Mobility   Pt able to stand and pivot with RW and modA+2. Left sitting EOB with bed alarm on.  Addison Lank Mobility Specialist  Phone [phone]

## 2021-03-04 LAB — GLUCOSE, CAPILLARY
Glucose-Capillary: 93 mg/dL (ref 70–99)
Glucose-Capillary: 95 mg/dL (ref 70–99)
Glucose-Capillary: 200 mg/dL — ABNORMAL HIGH (ref 70–99)
Glucose-Capillary: 193 mg/dL — ABNORMAL HIGH (ref 70–99)
Glucose-Capillary: 123 mg/dL — ABNORMAL HIGH (ref 70–99)

## 2021-03-04 MED ORDER — ZOLPIDEM TARTRATE 5 MG PO TABS
5.0000 mg | ORAL_TABLET | Freq: Every day | ORAL | Status: DC
  Administered 2021-03-04: 5 mg via ORAL
  Filled 2021-03-04: qty 1

## 2021-03-04 NOTE — Progress Notes (Signed)
HD#15 SUBJECTIVE:  Patient Summary: Verlinda Slotnick is a 45 y.o. with a pertinent PMH of DM, HTN, HLD, and depression who presents with LLE osteomyelitis s/p L transtibial amputation complicated by MSSA bacteremia and L4/L5 discitis  Overnight Events: No acute events overnight  Interim History: This is hospital day 15 for [facility] who was seen and evaluated at the bedside this morning. She reports that her pain is improved, but she does have some back spasms still. Overall is just upset that she is here over the [place].    OBJECTIVE:  Vital Signs: Vitals:   03/03/21 0744 03/03/21 1621 03/03/21 2034 03/04/21 0346  BP: 121/73 111/69 105/72 112/71  Pulse: 95 99 99 93  Resp: 19 19 18 18   Temp: 98 F (36.7 C) 98 F (36.7 C) 98.1 F (36.7 C) 98.4 F (36.9 C)  TempSrc: Oral Oral Oral Oral  SpO2: 97% 97% 97% 95%  Weight:      Height:       Supplemental O2: Room Air SpO2: 95 %   Filed Weights   02/18/21 0118  Weight: 121.2 kg     Intake/Output Summary (Last 24 hours) at 03/04/2021 0609 Last data filed at 03/03/2021 1827 Gross per 24 hour  Intake 830 ml  Output --  Net 830 ml   Net IO Since Admission: 1,312.47 mL [03/04/21 0609]  Physical Exam: General: No acute distress. Head: Normocephalic. Atraumatic. CV: RRR. No murmurs, rubs, or gallops. Pulmonary: Lungs CTAB. Normal effort. Abdominal: Soft, nontender, nondistended. Normal bowel sounds. Extremities: S/p L transtibial amputation, site is clean/dry/intact.  Skin: Warm and dry. No obvious rash or lesions. Neuro: A&Ox3. No focal deficit. Psych: Tearful mood and affect     ASSESSMENT/PLAN:  Assessment: Active Problems:   Bacteremia due to Staphylococcus aureus   Diabetes mellitus (HCC)   Left leg cellulitis   Subacute osteomyelitis, left ankle and foot (HCC)   Discitis of lumbar region   Pressure injury of skin   Adjustment disorder with mixed anxiety and depressed  mood   Plan: #LLE osteomyelitis s/p L transtibial amputation #MSSA bacteremia #L4-5 Discitis  Overall, patient is medically stable. Continue to reduce pain meds as able. Patient is currently receiving Oxycontin 10 mg bid scheduled, as well as oxycodone 10 mg q8h prn. She only required 1 dose of prn oxycodone in the last 24 hrs. Patient has been medically stable for discharge, however, CIR placement is pending still. Insurance authorization approved CIR yesterday.  - Continue IV ancef through 04/14/21 - Oxycontin 10 mg bid (scheduled) + oxycodone 10 mg q8h prn - Robaxin 750 mg q6h  - Naproxen 500 mg bid - PT/OT eval and treat - CIR placement pending  #Type 2 diabetes A1c 13. CBGs remain well controlled, ranging from 116-149. - Continue levemir 55u qhs - Novolog 8u tid with meals + SSI - Tradjenta 5 mg daily + metformin 500 mg daily  #Insomnia #Depression vs adjustment disorder Patient has had a difficult time adjusting this hospitalization, as she has been away from her family. Chaplain spoke with patient yesterday and is available to her as needed. Patient notes that 06/12/21 did help her to sleep some throughout the night.  - Ambien 5 mg qhs - Celexa 40 mg daily   Best Practice: Diet: Diabetic diet IVF: Fluids: none VTE: SCD's Start: 02/19/21 1817 Code: Full AB: Ancef Therapy Recs: CIR DISPO: Anticipated discharge in 1-2 days to  CIR  pending  placement .  Signature: 13/11/22, D.O.  Internal Medicine Resident, PGY-1 [facility]  Pager: [phone] 6:09 AM, 03/04/2021   Please contact the on call pager after 5 pm and on weekends at [phone].

## 2021-03-05 ENCOUNTER — Other Ambulatory Visit: Payer: Self-pay

## 2021-03-05 ENCOUNTER — Other Ambulatory Visit (HOSPITAL_COMMUNITY): Payer: Self-pay

## 2021-03-05 ENCOUNTER — Encounter (HOSPITAL_COMMUNITY): Payer: Self-pay | Admitting: Physical Medicine and Rehabilitation

## 2021-03-05 ENCOUNTER — Inpatient Hospital Stay (HOSPITAL_COMMUNITY)
Admit: 2021-03-05 | Discharge: 2021-03-23 | DRG: 560 | Disposition: A | Discharge status: Home Health | Payer: Managed Care, Other (non HMO) | Source: Intra-hospital | Attending: Physical Medicine and Rehabilitation | Admitting: Physical Medicine and Rehabilitation

## 2021-03-05 DIAGNOSIS — E1142 Type 2 diabetes mellitus with diabetic polyneuropathy: Secondary | ICD-10-CM | POA: Diagnosis present

## 2021-03-05 DIAGNOSIS — R7881 Bacteremia: Secondary | ICD-10-CM | POA: Diagnosis present

## 2021-03-05 DIAGNOSIS — Z888 Allergy status to other drugs, medicaments and biological substances status: Secondary | ICD-10-CM

## 2021-03-05 DIAGNOSIS — E669 Obesity, unspecified: Secondary | ICD-10-CM | POA: Diagnosis present

## 2021-03-05 DIAGNOSIS — Z87891 Personal history of nicotine dependence: Secondary | ICD-10-CM | POA: Diagnosis not present

## 2021-03-05 DIAGNOSIS — Z803 Family history of malignant neoplasm of breast: Secondary | ICD-10-CM | POA: Diagnosis not present

## 2021-03-05 DIAGNOSIS — F419 Anxiety disorder, unspecified: Secondary | ICD-10-CM | POA: Diagnosis present

## 2021-03-05 DIAGNOSIS — M7522 Bicipital tendinitis, left shoulder: Secondary | ICD-10-CM | POA: Diagnosis present

## 2021-03-05 DIAGNOSIS — Z89512 Acquired absence of left leg below knee: Secondary | ICD-10-CM

## 2021-03-05 DIAGNOSIS — B9561 Methicillin susceptible Staphylococcus aureus infection as the cause of diseases classified elsewhere: Secondary | ICD-10-CM | POA: Diagnosis present

## 2021-03-05 DIAGNOSIS — Z6839 Body mass index (BMI) 39.0-39.9, adult: Secondary | ICD-10-CM | POA: Diagnosis not present

## 2021-03-05 DIAGNOSIS — Z4781 Encounter for orthopedic aftercare following surgical amputation: Principal | ICD-10-CM

## 2021-03-05 DIAGNOSIS — I10 Essential (primary) hypertension: Secondary | ICD-10-CM | POA: Diagnosis present

## 2021-03-05 DIAGNOSIS — M4646 Discitis, unspecified, lumbar region: Secondary | ICD-10-CM | POA: Diagnosis present

## 2021-03-05 DIAGNOSIS — Z7984 Long term (current) use of oral hypoglycemic drugs: Secondary | ICD-10-CM

## 2021-03-05 DIAGNOSIS — Z79899 Other long term (current) drug therapy: Secondary | ICD-10-CM

## 2021-03-05 DIAGNOSIS — Z833 Family history of diabetes mellitus: Secondary | ICD-10-CM | POA: Diagnosis not present

## 2021-03-05 DIAGNOSIS — Z882 Allergy status to sulfonamides status: Secondary | ICD-10-CM

## 2021-03-05 DIAGNOSIS — E785 Hyperlipidemia, unspecified: Secondary | ICD-10-CM

## 2021-03-05 DIAGNOSIS — E46 Unspecified protein-calorie malnutrition: Secondary | ICD-10-CM | POA: Diagnosis present

## 2021-03-05 DIAGNOSIS — E8809 Other disorders of plasma-protein metabolism, not elsewhere classified: Secondary | ICD-10-CM | POA: Diagnosis present

## 2021-03-05 DIAGNOSIS — E43 Unspecified severe protein-calorie malnutrition: Secondary | ICD-10-CM

## 2021-03-05 DIAGNOSIS — L03116 Cellulitis of left lower limb: Secondary | ICD-10-CM

## 2021-03-05 DIAGNOSIS — Z794 Long term (current) use of insulin: Secondary | ICD-10-CM

## 2021-03-05 DIAGNOSIS — D62 Acute posthemorrhagic anemia: Secondary | ICD-10-CM

## 2021-03-05 HISTORY — DX: Acquired absence of left leg below knee: Z89.512

## 2021-03-05 LAB — GLUCOSE, CAPILLARY
Glucose-Capillary: 128 mg/dL — ABNORMAL HIGH (ref 70–99)
Glucose-Capillary: 100 mg/dL — ABNORMAL HIGH (ref 70–99)
Glucose-Capillary: 118 mg/dL — ABNORMAL HIGH (ref 70–99)

## 2021-03-05 MED ORDER — LINAGLIPTIN 5 MG PO TABS
5.0000 mg | ORAL_TABLET | Freq: Every day | ORAL | Status: DC
  Administered 2021-03-06 – 2021-03-23 (×18): 5 mg via ORAL
  Filled 2021-03-05 (×19): qty 1

## 2021-03-05 MED ORDER — POLYETHYLENE GLYCOL 3350 17 G PO PACK
17.0000 g | PACK | Freq: Every day | ORAL | Status: DC
  Administered 2021-03-08 – 2021-03-19 (×4): 17 g via ORAL
  Filled 2021-03-05 (×15): qty 1

## 2021-03-05 MED ORDER — PRAVASTATIN SODIUM 40 MG PO TABS: 40.0000 mg | ORAL_TABLET | Freq: Every day | ORAL | 0 refills | Status: DC

## 2021-03-05 MED ORDER — INSULIN ASPART 100 UNIT/ML IJ SOLN
8.0000 [IU] | Freq: Three times a day (TID) | INTRAMUSCULAR | 0 refills | Status: DC
Start: 2021-03-05 — End: 2021-03-23

## 2021-03-05 MED ORDER — ZOLPIDEM TARTRATE 5 MG PO TABS: 5.0000 mg | ORAL_TABLET | Freq: Every day | ORAL | 0 refills | Status: DC

## 2021-03-05 MED ORDER — OXYCODONE HCL 5 MG PO TABS
5.0000 mg | ORAL_TABLET | Freq: Three times a day (TID) | ORAL | Status: DC | PRN
  Administered 2021-03-06 – 2021-03-22 (×26): 5 mg via ORAL
  Filled 2021-03-05 (×26): qty 1

## 2021-03-05 MED ORDER — CEFAZOLIN IV (FOR PTA / DISCHARGE USE ONLY): 2.0000 g | Freq: Three times a day (TID) | INTRAVENOUS | 0 refills | Status: DC

## 2021-03-05 MED ORDER — OXYCODONE HCL ER 10 MG PO T12A
10.0000 mg | EXTENDED_RELEASE_TABLET | Freq: Two times a day (BID) | ORAL | Status: DC
  Administered 2021-03-05 – 2021-03-21 (×33): 10 mg via ORAL
  Filled 2021-03-05 (×33): qty 1

## 2021-03-05 MED ORDER — VITAMIN B-12 1000 MCG PO TABS
500.0000 ug | ORAL_TABLET | ORAL | Status: DC
  Administered 2021-03-06 – 2021-03-22 (×9): 500 ug via ORAL
  Filled 2021-03-05 (×9): qty 1

## 2021-03-05 MED ORDER — METFORMIN HCL 500 MG PO TABS
1000.0000 mg | ORAL_TABLET | Freq: Two times a day (BID) | ORAL | Status: DC
  Administered 2021-03-05 (×2): 1000 mg via ORAL
  Filled 2021-03-05 (×2): qty 2

## 2021-03-05 MED ORDER — GUAIFENESIN-DM 100-10 MG/5ML PO SYRP: 15.0000 mL | ORAL_SOLUTION | ORAL | Status: DC | PRN

## 2021-03-05 MED ORDER — ASCORBIC ACID 500 MG PO TABS
1000.0000 mg | ORAL_TABLET | Freq: Every day | ORAL | Status: DC
  Administered 2021-03-06 – 2021-03-23 (×18): 1000 mg via ORAL
  Filled 2021-03-05 (×18): qty 2

## 2021-03-05 MED ORDER — OXYCODONE HCL ER 10 MG PO T12A: 10.0000 mg | EXTENDED_RELEASE_TABLET | Freq: Two times a day (BID) | ORAL | 0 refills | Status: DC

## 2021-03-05 MED ORDER — CEFAZOLIN SODIUM-DEXTROSE 2-4 GM/100ML-% IV SOLN
2.0000 g | Freq: Three times a day (TID) | INTRAVENOUS | Status: DC
  Administered 2021-03-05 – 2021-03-23 (×53): 2 g via INTRAVENOUS
  Filled 2021-03-05 (×55): qty 100

## 2021-03-05 MED ORDER — INSULIN ASPART 100 UNIT/ML IJ SOLN
8.0000 [IU] | Freq: Three times a day (TID) | INTRAMUSCULAR | Status: DC
  Administered 2021-03-06 – 2021-03-22 (×35): 8 [IU] via SUBCUTANEOUS

## 2021-03-05 MED ORDER — METHOCARBAMOL 750 MG PO TABS
750.0000 mg | ORAL_TABLET | Freq: Four times a day (QID) | ORAL | Status: DC
  Administered 2021-03-05 – 2021-03-23 (×68): 750 mg via ORAL
  Filled 2021-03-05 (×68): qty 1

## 2021-03-05 MED ORDER — BISACODYL 5 MG PO TBEC
5.0000 mg | DELAYED_RELEASE_TABLET | Freq: Every day | ORAL | Status: DC | PRN
  Filled 2021-03-05: qty 1

## 2021-03-05 MED ORDER — NAPROXEN 250 MG PO TABS
500.0000 mg | ORAL_TABLET | Freq: Two times a day (BID) | ORAL | Status: DC
  Administered 2021-03-06 – 2021-03-23 (×35): 500 mg via ORAL
  Filled 2021-03-05 (×35): qty 2

## 2021-03-05 MED ORDER — CITALOPRAM HYDROBROMIDE 20 MG PO TABS
40.0000 mg | ORAL_TABLET | Freq: Every day | ORAL | Status: DC
  Administered 2021-03-06 – 2021-03-23 (×18): 40 mg via ORAL
  Filled 2021-03-05 (×19): qty 2

## 2021-03-05 MED ORDER — ENOXAPARIN SODIUM 60 MG/0.6ML IJ SOSY
60.0000 mg | PREFILLED_SYRINGE | INTRAMUSCULAR | Status: DC
  Administered 2021-03-06 – 2021-03-23 (×18): 60 mg via SUBCUTANEOUS
  Filled 2021-03-05 (×18): qty 0.6

## 2021-03-05 MED ORDER — ZOLPIDEM TARTRATE 5 MG PO TABS
5.0000 mg | ORAL_TABLET | Freq: Every day | ORAL | Status: DC
  Administered 2021-03-05 – 2021-03-22 (×18): 5 mg via ORAL
  Filled 2021-03-05 (×18): qty 1

## 2021-03-05 MED ORDER — NAPROXEN 500 MG PO TABS: 500.0000 mg | ORAL_TABLET | Freq: Two times a day (BID) | ORAL | 0 refills | Status: DC

## 2021-03-05 MED ORDER — PRAVASTATIN SODIUM 40 MG PO TABS
40.0000 mg | ORAL_TABLET | Freq: Every day | ORAL | Status: DC
  Administered 2021-03-06 – 2021-03-23 (×18): 40 mg via ORAL
  Filled 2021-03-05 (×18): qty 1

## 2021-03-05 MED ORDER — PANTOPRAZOLE SODIUM 40 MG PO TBEC
40.0000 mg | DELAYED_RELEASE_TABLET | Freq: Every day | ORAL | Status: DC
  Administered 2021-03-06 – 2021-03-23 (×18): 40 mg via ORAL
  Filled 2021-03-05 (×18): qty 1

## 2021-03-05 MED ORDER — METFORMIN HCL 500 MG PO TABS
1000.0000 mg | ORAL_TABLET | Freq: Two times a day (BID) | ORAL | Status: DC
  Administered 2021-03-06 – 2021-03-23 (×35): 1000 mg via ORAL
  Filled 2021-03-05 (×35): qty 2

## 2021-03-05 MED ORDER — INSULIN DETEMIR 100 UNIT/ML ~~LOC~~ SOLN: 55.0000 [IU] | Freq: Every day | SUBCUTANEOUS | 0 refills | Status: DC

## 2021-03-05 MED ORDER — INSULIN DETEMIR 100 UNIT/ML ~~LOC~~ SOLN
55.0000 [IU] | Freq: Every day | SUBCUTANEOUS | Status: DC
  Administered 2021-03-05 – 2021-03-22 (×18): 55 [IU] via SUBCUTANEOUS
  Filled 2021-03-05 (×20): qty 0.55

## 2021-03-05 MED ORDER — PROSOURCE PLUS PO LIQD
30.0000 mL | Freq: Two times a day (BID) | ORAL | Status: DC
  Administered 2021-03-06 – 2021-03-23 (×35): 30 mL via ORAL
  Filled 2021-03-05 (×35): qty 30

## 2021-03-05 MED ORDER — INSULIN ASPART 100 UNIT/ML IJ SOLN
0.0000 [IU] | Freq: Three times a day (TID) | INTRAMUSCULAR | Status: DC
Start: 2021-03-06 — End: 2021-03-23
  Administered 2021-03-08: 18:00:00 4 [IU] via SUBCUTANEOUS
  Administered 2021-03-08 – 2021-03-15 (×9): 3 [IU] via SUBCUTANEOUS
  Administered 2021-03-16: 4 [IU] via SUBCUTANEOUS
  Administered 2021-03-18: 3 [IU] via SUBCUTANEOUS
  Administered 2021-03-19: 2 [IU] via SUBCUTANEOUS

## 2021-03-05 MED ORDER — DICLOFENAC SODIUM 1 % EX GEL
2.0000 g | Freq: Four times a day (QID) | CUTANEOUS | Status: DC
  Administered 2021-03-05 – 2021-03-22 (×56): 2 g via TOPICAL
  Filled 2021-03-05: qty 100

## 2021-03-05 MED ORDER — ENOXAPARIN SODIUM 60 MG/0.6ML IJ SOSY: 60.0000 mg | PREFILLED_SYRINGE | INTRAMUSCULAR | Status: DC

## 2021-03-05 MED ORDER — METFORMIN HCL 1000 MG PO TABS: 1000.0000 mg | ORAL_TABLET | Freq: Two times a day (BID) | ORAL | 0 refills | Status: DC

## 2021-03-05 MED ORDER — DOCUSATE SODIUM 100 MG PO CAPS
100.0000 mg | ORAL_CAPSULE | Freq: Every day | ORAL | Status: DC
  Administered 2021-03-08 – 2021-03-23 (×16): 100 mg via ORAL
  Filled 2021-03-05 (×18): qty 1

## 2021-03-05 MED ORDER — ENSURE ENLIVE PO LIQD
237.0000 mL | Freq: Two times a day (BID) | ORAL | Status: DC
  Administered 2021-03-05 – 2021-03-22 (×35): 237 mL via ORAL

## 2021-03-05 MED ORDER — LINAGLIPTIN 5 MG PO TABS: 5.0000 mg | ORAL_TABLET | Freq: Every day | ORAL | 0 refills | Status: DC

## 2021-03-05 MED ORDER — ACETAMINOPHEN 325 MG PO TABS
325.0000 mg | ORAL_TABLET | Freq: Four times a day (QID) | ORAL | Status: DC | PRN
  Administered 2021-03-06 – 2021-03-22 (×4): 650 mg via ORAL
  Filled 2021-03-05 (×5): qty 2

## 2021-03-05 MED ORDER — METHOCARBAMOL 750 MG PO TABS: 750.0000 mg | ORAL_TABLET | Freq: Four times a day (QID) | ORAL | 0 refills | Status: DC | PRN

## 2021-03-05 MED ORDER — OXYCODONE HCL 5 MG PO TABS: 5.0000 mg | ORAL_TABLET | Freq: Three times a day (TID) | ORAL | Status: DC | PRN

## 2021-03-05 NOTE — Progress Notes (Signed)
HD#16 SUBJECTIVE:  Patient Summary: Lori Schmidt is a 45 y.o. with a pertinent PMH of DM, HTN, HLD, and depression who presents with LLE osteomyelitis s/p L transtibial amputation complicated by MSSA bacteremia and L4/L5 discitis.  Overnight Events: No acute events overnight  Interim History: This is hospital day 16 for Lori Schmidt who was seen and evaluated at the bedside this morning. She had no back spasms overnight and was able to sit on the bedside commode on her own. Overall, she feels like her pain is much improved and she is eager to get to rehab.   OBJECTIVE:  Vital Signs: Vitals:   03/04/21 0736 03/04/21 1629 03/04/21 2050 03/05/21 0541  BP: 110/70 114/60 111/71 111/70  Pulse: 98 100 96 96  Resp: 19 19 18 18   Temp: 98.3 F (36.8 C) 97.6 F (36.4 C) 98.2 F (36.8 C) 98.2 F (36.8 C)  TempSrc: Oral Oral Oral Oral  SpO2: 100% 96% 97% 97%  Weight:      Height:       Supplemental O2: Room Air SpO2: 97 %   Filed Weights   02/18/21 0118  Weight: 121.2 kg     Intake/Output Summary (Last 24 hours) at 03/05/2021 0641 Last data filed at 03/05/2021 0600 Gross per 24 hour  Intake 2020 ml  Output --  Net 2020 ml   Net IO Since Admission: 3,332.47 mL [03/05/21 0641]  Physical Exam: General: No acute distress. CV: RRR. No murmurs, rubs, or gallops. Pulmonary: Lungs CTAB. Normal effort.  Abdominal: Soft, nontender, nondistended. Extremities: S/p L transtibial amputation with wound vac still in place, no output.  Skin: Warm and dry.  Neuro: A&Ox3. No focal deficit. Psych: Normal mood and affect   ASSESSMENT/PLAN:  Assessment: Active Problems:   Bacteremia due to Staphylococcus aureus   Diabetes mellitus (HCC)   Left leg cellulitis   Subacute osteomyelitis, left ankle and foot (HCC)   Discitis of lumbar region   Pressure injury of skin   Adjustment disorder with mixed anxiety and depressed mood   Plan: #LLE osteomyelitis s/p L  transtibial amputation #MSSA bacteremia #L4-5 Discitis  Patient remains medically stable for discharge. Patient continues to receive oxycontin 10 mg bid and oxycodone 10 mg q8h prn. Did not require any PRN oxycodone doses in the last 24 hrs and overall, has had a significant improvement in her pain. CIR placement still pending, although insurance authorization is approved.  - Continue IV ancef through 04/14/21 - Oxycontin 10 mg bid + oxycodone 5 mg q8h prn - Robaxin 750 mg q6h  - Naproxen 500 mg bid - PT/OT eval and treat - CIR placement pending  #Type 2 diabetes A1c 13. CBGs ranging from 123-193 over the last 24 hrs.  - Continue levemir 55u qhs - Novolog 8u tid with meals + SSI - Tradjenta 5 mg daily + metformin 1000 mg twice daily with meals   #Insomnia #Depression vs adjustment disorder Patient's mood is much improved today, specifically since she was able to have her family visit with her yesterday. Ambien continues to help her sleep at night.  - Ambien 5 mg qhs - Celexa 40 mg daily    Best Practice: Diet: Diabetic diet IVF: Fluids: none VTE: SCD's Start: 02/19/21 1817 and Lovenox Code: Full AB: Ancef Therapy Recs: CIR DISPO: Anticipated discharge to  CIR  pending  placement and bed availability .  Signature: 13/11/22, D.O.  Internal Medicine Resident, PGY-1 [facility]  Pager: [phone]  6:41 AM, 03/05/2021   Please contact the on call pager after 5 pm and on weekends at [phone].

## 2021-03-05 NOTE — H&P (Signed)
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  Per chart review patient lives with her 5 year old daughter.  1 level home 5 steps to entry.  She works at [employer].  Independent prior to admission.  She does have a mother in the area.  Presented 02/17/2021 with increasing left lower extremity swelling and decreased functional gait as well as spiked a low-grade fever, nausea and vomiting and general malaise.  She denied any injury.  Patient had initially been seen at [facility] 02/11/2021 for left lower extremity swelling plan was for a venous Doppler however patient left AMA.  In the ED 02/16/2021 venous Dopplers completed of left lower extremity showing no DVT and she received antibiotic therapy and was discharged home with blood cultures pending.  The following day patient was contacted blood cultures positive MSSA and she returned to the ED.  In the ED patient tachycardic 118 potassium 2.7 glucose 335 sodium 128, WBC 8600, hemoglobin 10.8, lactic acid 1.2, urine drug screen negative.  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  Numerous peripherally enhancing joint effusions throughout the left midfoot.  Extensive tenosynovial fluid collections throughout the flexor and extensor tendons of the left foot compatible with infectious tenosynovitis.  Orthopedic service Dr. Sharol Given consulted patient underwent left transtibial amputation application of wound VAC 02/19/2021.  Patient was maintained on antibiotic therapy with Ancef per infectious disease through 04/14/2021.  TEE completed showing no evidence of endocarditis.  Patient with  persistent low back pain with MRI completed 02/20/2021 showing edema L4-L5 disc with adjacent inferior L4 and superior L5 endplate edema and enhancement concerning for discitis/osteomyelitis and again repeated MRI 02/28/2021 redemonstrating marrow edema L4-5 degree of signal change slightly progressed compared to previous MRI.  Scans were reviewed by infectious disease recommendations were to continue intravenous antibiotics as directed.  Subcutaneous Lovenox for DVT prophylaxis.  Acute blood loss anemia 9.7 and monitored.  Therapy evaluations completed due to patient decreased functional mobility was admitted for a comprehensive rehab program.   Pt admits has had DM for 14 years, however not "great' about caring for DM- and "for some reason gets infections easily and hard to treat"- I explained it's due ot her CBGs' being so out of control-  LBM yesterday; peeing OK.  Pain better- no muscle spasms for 2 days in back- Controlled now as long as gets meds on time.  Is C/o L shoulder pain- anterior.  No [name] pain.   Review of Systems  Constitutional:  Positive for fever and malaise/fatigue.  HENT:  Negative for hearing loss.   Eyes:  Negative for blurred vision and double vision.  Respiratory:  Negative for cough and shortness of breath.   Cardiovascular:  Positive for leg swelling. Negative for chest pain and palpitations.  Gastrointestinal:  Positive for constipation, nausea and vomiting.  Genitourinary:  Negative for dysuria, flank pain and hematuria.  Musculoskeletal:  Positive for joint pain and myalgias.  Skin:  Negative for rash.  Psychiatric/Behavioral:  Positive for depression. The patient has insomnia.   All other systems reviewed and are negative. Past Medical History:  Diagnosis Date   Hyperlipidemia    Hypertension  Obesity    Type 2 diabetes mellitus [facility])    Past Surgical History:  Procedure Laterality Date   AMPUTATION Left 02/19/2021   Procedure: AMPUTATION BELOW  KNEE;  Surgeon: Newt Minion, MD;  Location: [place];  Service: Orthopedics;  Laterality: Left;   BREAST CYST EXCISION Right 05/2018   four cysts removed on right breast/axilla area   TEE WITHOUT CARDIOVERSION N/A 02/23/2021   Procedure: TRANSESOPHAGEAL ECHOCARDIOGRAM (TEE);  Surgeon: Pixie Casino, MD;  Location: [facility] ENDOSCOPY;  Service: Cardiovascular;  Laterality: N/A;   Family History  Problem Relation Age of Onset   Hypothyroidism Mother    Diabetes Mother    Diabetes Maternal Grandmother    Breast cancer Paternal Grandmother    Social History:  reports that she quit smoking about 10 years ago. Her smoking use included cigarettes. She has never used smokeless tobacco. She reports that she does not drink alcohol and does not use drugs. Allergies:  Allergies  Allergen Reactions   Sulfa Antibiotics Other (See Comments)    Makes sick   Lisinopril Cough   Medications Prior to Admission  Medication Sig Dispense Refill   cephALEXin (KEFLEX) 500 MG capsule Take 500 mg by mouth See admin instructions. Every 6 hours x 10 days     citalopram (CELEXA) 40 MG tablet Take 40 mg by mouth at bedtime.     ibuprofen (ADVIL) 200 MG tablet Take 600 mg by mouth every 6 (six) hours as needed for headache or moderate pain.     insulin detemir (LEVEMIR) 100 UNIT/ML injection Inject 100 Units into the skin at bedtime.     JANUVIA 100 MG tablet Take 100 mg by mouth at bedtime.     losartan (COZAAR) 25 MG tablet Take 25 mg by mouth at bedtime.     magnesium oxide (MAG-OX) 400 MG tablet Take 400 mg by mouth daily.     medroxyPROGESTERone Acetate (DEPO-PROVERA IM) Inject into the muscle every 3 (three) months.     metFORMIN (GLUCOPHAGE) 1000 MG tablet Take 500 mg by mouth at bedtime.     ondansetron (ZOFRAN) 8 MG tablet Take 8 mg by mouth every 8 (eight) hours as needed for nausea or vomiting.     pravastatin (PRAVACHOL) 20 MG tablet Take 20 mg by mouth at bedtime.     vitamin B-12 (CYANOCOBALAMIN) 500  MCG tablet Take 500 mcg by mouth daily.      Drug Regimen Review Drug regimen was reviewed and remains appropriate with no significant issues identified  Home: Home Living Family/patient expects to be discharged to:: Private residence Living Arrangements: Children (30 y/o daughter) Available Help at Discharge: Family, Available PRN/intermittently Type of Home: House Home Access: Stairs to enter [employer] of Steps: 5-6 Entrance Stairs-Rails: Right, Left, Can reach both Home Layout: One level Bathroom Shower/Tub: Multimedia programmer: Standard Home Equipment: Conservation officer, nature (2 wheels), Tub bench   Functional History: Prior Function Prior Level of Function : Independent/Modified Independent Mobility Comments: Works at [employer] ADLs Comments: Independent  Functional Status:  Mobility: Bed Mobility Overal bed mobility: Needs Assistance Bed Mobility: Rolling, Sidelying to Sit Rolling: Min guard Sidelying to sit: Min assist Sit to supine: +2 for physical assistance, HOB elevated, Total assist Sit to sidelying: +2 for safety/equipment, +2 for physical assistance, Mod assist General bed mobility comments: Pt able to come to EOB with directional cues and use of rail. Transfers Overall transfer level: Needs assistance Equipment used: Rolling walker (2 wheels) Transfers: Sit to/from  Stand Sit to Stand: Mod assist, +2 physical assistance Bed to/from chair/wheelchair/BSC transfer type:: Stand pivot Stand pivot transfers: Mod assist, +2 safety/equipment  Lateral/Scoot Transfers: Mod assist, +2 physical assistance, +2 safety/equipment, From elevated surface Transfer via Lift Equipment: Stedy General transfer comment: patient stood from recliner with mod assist +2 with on second attempt and performed stand pivot transfer to EOB Ambulation/Gait General Gait Details: unable to stand    ADL: ADL Overall ADL's : Needs assistance/impaired Eating/Feeding: Set  up, Bed level (HOb elevated) Grooming: Wash/dry face, Brushing hair, Set up, Sitting Grooming Details (indicate cue type and reason): lateral lean to right, able to off weight her R arm to use for grooming tasks Upper Body Bathing: Minimal assistance Upper Body Bathing Details (indicate cue type and reason): for back sitting EOB Lower Body Bathing: Maximal assistance, Bed level Upper Body Dressing : Moderate assistance Lower Body Dressing: Maximal assistance, Bed level Lower Body Dressing Details (indicate cue type and reason): reposition limb guard Toilet Transfer: Maximal assistance, +2 for safety/equipment, +2 for physical assistance, Requires drop arm, Requires wide/bariatric Toilet Transfer Details (indicate cue type and reason): failed attempt at lateral transfer today - she will need to continue to use bed pain at this time Toileting- Clothing Manipulation and Hygiene: Maximal assistance, +2 for safety/equipment, Bed level Toileting - Clothing Manipulation Details (indicate cue type and reason): rolling for peri care Functional mobility during ADLs: Maximal assistance, Total assistance, +2 for physical assistance, +2 for safety/equipment (attempted lateral transfer) General ADL Comments: Pt much better cognitively, but get internally distracted by pain which impacts her ability to perform transfers essential to ADL- Pt DOES put forth good effort and is motivated  Cognition: Cognition Overall Cognitive Status: Within Functional Limits for tasks assessed Orientation Level: Oriented X4 Cognition Arousal/Alertness: Awake/alert Behavior During Therapy: WFL for tasks assessed/performed Overall Cognitive Status: Within Functional Limits for tasks assessed Area of Impairment: Awareness, Following commands, Problem solving Following Commands: Follows one step commands consistently Safety/Judgement: Decreased awareness of safety, Decreased awareness of deficits Awareness: Emergent Problem  Solving: Decreased initiation, Requires verbal cues, Requires tactile cues General Comments: patient more calm with transfrs and exhibits less anxiety Difficult to assess due to: Level of arousal  Physical Exam: Blood pressure 111/70, pulse 96, temperature 98.2 F (36.8 C), temperature source Oral, resp. rate 18, height 5\' 7"  (1.702 m), weight 121.2 kg, SpO2 97 %. Physical Exam Vitals and nursing note reviewed.  Constitutional:      Appearance: She is obese.     Comments: Sitting up in bed; nurse just left room; appears older than stated age; NAD  HENT:     Head: Normocephalic and atraumatic.     Right Ear: External ear normal.     Left Ear: External ear normal.     Nose: Nose normal. No congestion.     Mouth/Throat:     Mouth: Mucous membranes are dry.     Pharynx: Oropharynx is clear. No oropharyngeal exudate.  Eyes:     General:        Right eye: No discharge.        Left eye: No discharge.     Extraocular Movements: Extraocular movements intact.  Cardiovascular:     Rate and Rhythm: Normal rate and regular rhythm.     Heart sounds: Normal heart sounds. No murmur heard.   No gallop.  Pulmonary:     Effort: Pulmonary effort is normal. No respiratory distress.     Breath sounds: Normal breath sounds. No  wheezing, rhonchi or rales.  Abdominal:     General: Bowel sounds are normal. There is no distension.     Palpations: Abdomen is soft.     Tenderness: There is no abdominal tenderness.  Musculoskeletal:     Cervical back: Normal range of motion. No rigidity.     Comments: UE strength 5/5 B/L  RLE- 5/5 LLE- 5-/5 in HF/KE/KF [name] with wound VAC in place TTP over L anterior shoulder c/w L bicipital tendinitis  Skin:    General: Skin is warm and dry.     Comments: [name] with wound VAC in place RUE PICC in place- looks OK  Neurological:     Mental Status: She is alert.     Comments: Patient is alert.  No acute distress.  Oriented x3 and follows commands. Decreased to  light touch below knee on RLE CN's OK B/L   Psychiatric:        Behavior: Behavior normal.     Comments: Anxious, and eager to work with therapy    Results for orders placed or performed during the hospital encounter of 02/17/21 (from the past 48 hour(s))  Glucose, capillary     Status: Abnormal   Collection Time: 03/03/21  7:45 AM  Result Value Ref Range   Glucose-Capillary 116 (H) 70 - 99 mg/dL    Comment: Glucose reference range applies only to samples taken after fasting for at least 8 hours.  Glucose, capillary     Status: Abnormal   Collection Time: 03/03/21 12:53 PM  Result Value Ref Range   Glucose-Capillary 158 (H) 70 - 99 mg/dL    Comment: Glucose reference range applies only to samples taken after fasting for at least 8 hours.  Glucose, capillary     Status: Abnormal   Collection Time: 03/03/21  4:21 PM  Result Value Ref Range   Glucose-Capillary 128 (H) 70 - 99 mg/dL    Comment: Glucose reference range applies only to samples taken after fasting for at least 8 hours.  Glucose, capillary     Status: Abnormal   Collection Time: 03/03/21  8:33 PM  Result Value Ref Range   Glucose-Capillary 149 (H) 70 - 99 mg/dL    Comment: Glucose reference range applies only to samples taken after fasting for at least 8 hours.  Glucose, capillary     Status: None   Collection Time: 03/04/21  7:37 AM  Result Value Ref Range   Glucose-Capillary 93 70 - 99 mg/dL    Comment: Glucose reference range applies only to samples taken after fasting for at least 8 hours.  Glucose, capillary     Status: None   Collection Time: 03/04/21 11:30 AM  Result Value Ref Range   Glucose-Capillary 95 70 - 99 mg/dL    Comment: Glucose reference range applies only to samples taken after fasting for at least 8 hours.  Glucose, capillary     Status: Abnormal   Collection Time: 03/04/21  4:31 PM  Result Value Ref Range   Glucose-Capillary 200 (H) 70 - 99 mg/dL    Comment: Glucose reference range applies only  to samples taken after fasting for at least 8 hours.  Glucose, capillary     Status: Abnormal   Collection Time: 03/04/21  5:57 PM  Result Value Ref Range   Glucose-Capillary 193 (H) 70 - 99 mg/dL    Comment: Glucose reference range applies only to samples taken after fasting for at least 8 hours.   Comment 1 Notify  RN    Comment 2 Document in Chart   Glucose, capillary     Status: Abnormal   Collection Time: 03/04/21  8:55 PM  Result Value Ref Range   Glucose-Capillary 123 (H) 70 - 99 mg/dL    Comment: Glucose reference range applies only to samples taken after fasting for at least 8 hours.   No results found.     Medical Problem List and Plan: 1. [name] with functional deficits secondary to left lower extremity osteomyelitis Status Post left transtibial amputation 02/19/2021/MSSA bacteremia/L4-5 discitis  -patient may not shower until wound VAC removed  -ELOS/Goals: 10-12 days supervision 2.  Antithrombotics: -DVT/anticoagulation:  Pharmaceutical: Lovenox.  Venous Doppler is negative  -antiplatelet therapy: N/A 3. Pain Management: due to L4/5 discitis- OxyContin sustained-release 10 mg every 12 hours,, Robaxin-750 milligram every 6 hours, Naprosyn 500 mg twice daily oxycodone as needed 4. Mood: Celexa 40 mg daily, Ambien 5 mg nightly.  Provide emotional support  -antipsychotic agents: N/A 5. Neuropsych: This patient is capable of making decisions on her own behalf. 6. Skin/Wound Care: Routine skin checks 7. Fluids/Electrolytes/Nutrition: Routine in and outs with follow-up chemistries 8.  ID/MSSA bacteremia.  Continue Ancef 2 g every 8 hours through 04/14/2021 and stop- per ID.  9.  Acute blood loss anemia.  Follow-up CBC 10.  Diabetes mellitus with peripheral neuropathy. A1c 13- had for 14 years;  NovoLog 8 units 3 times daily with meals, Levemir 55 units nightly, Tradjenta 5 mg daily, Glucophage 500 mg daily.  Check blood sugars before meals and at bedtime.  Diabetic teaching 11.   Hyperlipidemia.  Pravachol 12.  Obesity.  Dietary follow-up 13. L shoulder bicipital tendinitis- suggest starting Voltaren gel 2 G QID 14. [name]- went over risk of L hip and L knee contractures which will make prosthesis fitting more difficult- advised pt to lay on stomach or on side and stretch hip and knee/extend them multiple times per day.     I have personally performed a face to face diagnostic evaluation of this patient and formulated the key components of the plan.  Additionally, I have personally reviewed laboratory data, imaging studies, as well as relevant notes and concur with the physician assistant's documentation above.   The patient's status has not changed from the original H&P.  Any changes in documentation from the acute care chart have been noted above.      Lavon Paganini Angiulli, PA-C 03/05/2021

## 2021-03-05 NOTE — H&P (Signed)
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  Per chart review patient lives with her 19 year old daughter.  1 level home 5 steps to entry.  She works at [employer].  Independent prior to admission.  She does have a mother in the area.  Presented 02/17/2021 with increasing left lower extremity swelling and decreased functional gait as well as spiked a low-grade fever, nausea and vomiting and general malaise.  She denied any injury.  Patient had initially been seen at [facility] 02/11/2021 for left lower extremity swelling plan was for a venous Doppler however patient left AMA.  In the ED 02/16/2021 venous Dopplers completed of left lower extremity showing no DVT and she received antibiotic therapy and was discharged home with blood cultures pending.  The following day patient was contacted blood cultures positive MSSA and she returned to the ED.  In the ED patient tachycardic 118 potassium 2.7 glucose 335 sodium 128, WBC 8600, hemoglobin 10.8, lactic acid 1.2, urine drug screen negative.  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  Numerous peripherally enhancing joint effusions throughout the left midfoot.  Extensive tenosynovial fluid collections throughout the flexor and extensor tendons of the left foot compatible with infectious tenosynovitis.  Orthopedic service Dr. Sharol Given consulted patient underwent left transtibial amputation application of wound VAC 02/19/2021.  Patient was maintained on antibiotic therapy with Ancef per infectious disease through 04/14/2021.  TEE completed showing no evidence of endocarditis.  Patient  with persistent low back pain with MRI completed 02/20/2021 showing edema L4-L5 disc with adjacent inferior L4 and superior L5 endplate edema and enhancement concerning for discitis/osteomyelitis and again repeated MRI 02/28/2021 redemonstrating marrow edema L4-5 degree of signal change slightly progressed compared to previous MRI.  Scans were reviewed by infectious disease recommendations were to continue intravenous antibiotics as directed.  Subcutaneous Lovenox for DVT prophylaxis.  Acute blood loss anemia 9.7 and monitored.  Therapy evaluations completed due to patient decreased functional mobility was admitted for a comprehensive rehab program.     Pt admits has had DM for 14 years, however not "great' about caring for DM- and "for some reason gets infections easily and hard to treat"- I explained it's due ot her CBGs' being so out of control-   LBM yesterday; peeing OK.  Pain better- no muscle spasms for 2 days in back- Controlled now as long as gets meds on time.  Is C/o L shoulder pain- anterior.  No [name] pain.    Review of Systems  Constitutional:  Positive for fever and malaise/fatigue.  HENT:  Negative for hearing loss.   Eyes:  Negative for blurred vision and double vision.  Respiratory:  Negative for cough and shortness of breath.   Cardiovascular:  Positive for leg swelling. Negative for chest pain and palpitations.  Gastrointestinal:  Positive for constipation, nausea and vomiting.  Genitourinary:  Negative for dysuria, flank pain and hematuria.  Musculoskeletal:  Positive for joint pain and myalgias.  Skin:  Negative for rash.  Psychiatric/Behavioral:  Positive for depression. The patient has insomnia.   All other systems reviewed and are negative.  Past Medical History:  Diagnosis Date   Hyperlipidemia     Hypertension     Obesity     Type 2 diabetes mellitus ([place])           Past Surgical History:  Procedure Laterality Date   AMPUTATION Left 02/19/2021     Procedure: AMPUTATION BELOW KNEE;  Surgeon: Newt Minion, MD;  Location: [place];  Service: Orthopedics;  Laterality: Left;   BREAST CYST EXCISION Right 05/2018    four cysts removed on right breast/axilla area   TEE WITHOUT CARDIOVERSION N/A 02/23/2021    Procedure: TRANSESOPHAGEAL ECHOCARDIOGRAM (TEE);  Surgeon: Pixie Casino, MD;  Location: [facility] ENDOSCOPY;  Service: Cardiovascular;  Laterality: N/A;         Family History  Problem Relation Age of Onset   Hypothyroidism Mother     Diabetes Mother     Diabetes Maternal Grandmother     Breast cancer Paternal Grandmother      Social History:  reports that she quit smoking about 10 years ago. Her smoking use included cigarettes. She has never used smokeless tobacco. She reports that she does not drink alcohol and does not use drugs. Allergies:       Allergies  Allergen Reactions   Sulfa Antibiotics Other (See Comments)      Makes sick   Lisinopril Cough          Medications Prior to Admission  Medication Sig Dispense Refill   cephALEXin (KEFLEX) 500 MG capsule Take 500 mg by mouth See admin instructions. Every 6 hours x 10 days       citalopram (CELEXA) 40 MG tablet Take 40 mg by mouth at bedtime.       ibuprofen (ADVIL) 200 MG tablet Take 600 mg by mouth every 6 (six) hours as needed for headache or moderate pain.       insulin detemir (LEVEMIR) 100 UNIT/ML injection Inject 100 Units into the skin at bedtime.       JANUVIA 100 MG tablet Take 100 mg by mouth at bedtime.       losartan (COZAAR) 25 MG tablet Take 25 mg by mouth at bedtime.       magnesium oxide (MAG-OX) 400 MG tablet Take 400 mg by mouth daily.       medroxyPROGESTERone Acetate (DEPO-PROVERA IM) Inject into the muscle every 3 (three) months.       metFORMIN (GLUCOPHAGE) 1000 MG tablet Take 500 mg by mouth at bedtime.       ondansetron (ZOFRAN) 8 MG tablet Take 8 mg by mouth every 8 (eight) hours as needed for nausea or vomiting.       pravastatin (PRAVACHOL) 20 MG  tablet Take 20 mg by mouth at bedtime.       vitamin B-12 (CYANOCOBALAMIN) 500 MCG tablet Take 500 mcg by mouth daily.          Drug Regimen Review Drug regimen was reviewed and remains appropriate with no significant issues identified   Home: Home Living Family/patient expects to be discharged to:: Private residence Living Arrangements: Children (98 y/o daughter) Available Help at Discharge: Family, Available PRN/intermittently Type of Home: House Home Access: Stairs to enter [employer] of Steps: 5-6 Entrance Stairs-Rails: Right, Left, Can reach both Home Layout: One level Bathroom Shower/Tub: Multimedia programmer: Standard Home Equipment: Conservation officer, nature (2 wheels), Tub bench   Functional History: Prior Function Prior Level of Function : Independent/Modified Independent Mobility Comments: Works at [employer] ADLs Comments: Independent  Functional Status:  Mobility: Bed Mobility Overal bed mobility: Needs Assistance Bed Mobility: Rolling, Sidelying to Sit Rolling: Min guard Sidelying to sit: Min assist Sit to supine: +2 for physical assistance, HOB elevated, Total assist Sit to sidelying: +2 for safety/equipment, +2 for physical assistance, Mod assist General bed mobility comments: Pt able to come to EOB with directional cues and use of rail. Transfers Overall transfer level: Needs assistance Equipment used: Rolling walker (2 wheels) Transfers: Sit to/from Stand Sit to Stand: Mod assist, +2 physical assistance Bed to/from chair/wheelchair/BSC transfer type:: Stand pivot Stand pivot transfers: Mod assist, +2 safety/equipment  Lateral/Scoot Transfers: Mod assist, +2 physical assistance, +2 safety/equipment, From elevated surface Transfer via Lift Equipment: Stedy General transfer comment: patient stood from recliner with mod assist +2 with on second attempt and performed stand pivot transfer to EOB Ambulation/Gait General Gait Details: unable to  stand   ADL: ADL Overall ADL's : Needs assistance/impaired Eating/Feeding: Set up, Bed level (HOb elevated) Grooming: Wash/dry face, Brushing hair, Set up, Sitting Grooming Details (indicate cue type and reason): lateral lean to right, able to off weight her R arm to use for grooming tasks Upper Body Bathing: Minimal assistance Upper Body Bathing Details (indicate cue type and reason): for back sitting EOB Lower Body Bathing: Maximal assistance, Bed level Upper Body Dressing : Moderate assistance Lower Body Dressing: Maximal assistance, Bed level Lower Body Dressing Details (indicate cue type and reason): reposition limb guard Toilet Transfer: Maximal assistance, +2 for safety/equipment, +2 for physical assistance, Requires drop arm, Requires wide/bariatric Toilet Transfer Details (indicate cue type and reason): failed attempt at lateral transfer today - she will need to continue to use bed pain at this time Toileting- Clothing Manipulation and Hygiene: Maximal assistance, +2 for safety/equipment, Bed level Toileting - Clothing Manipulation Details (indicate cue type and reason): rolling for peri care Functional mobility during ADLs: Maximal assistance, Total assistance, +2 for physical assistance, +2 for safety/equipment (attempted lateral transfer) General ADL Comments: Pt much better cognitively, but get internally distracted by pain which impacts her ability to perform transfers essential to ADL- Pt DOES put forth good effort and is motivated   Cognition: Cognition Overall Cognitive Status: Within Functional Limits for tasks assessed Orientation Level: Oriented X4 Cognition Arousal/Alertness: Awake/alert Behavior During Therapy: WFL for tasks assessed/performed Overall Cognitive Status: Within Functional Limits for tasks assessed Area of Impairment: Awareness, Following commands, Problem solving Following Commands: Follows one step commands consistently Safety/Judgement: Decreased  awareness of safety, Decreased awareness of deficits Awareness: Emergent Problem Solving: Decreased initiation, Requires verbal cues, Requires tactile cues General Comments: patient more calm with transfrs and exhibits less anxiety Difficult to assess due to: Level of arousal   Physical Exam: Blood pressure 111/70, pulse 96, temperature 98.2 F (36.8 C), temperature source Oral, resp. rate 18, height 5\' 7"  (1.702 m), weight 121.2 kg, SpO2 97 %. Physical Exam Vitals and nursing note reviewed.  Constitutional:      Appearance: She is obese.     Comments: Sitting up in bed; nurse just left room; appears older than stated age; NAD  HENT:     Head: Normocephalic and atraumatic.     Right Ear: External ear normal.     Left Ear: External ear normal.     Nose: Nose normal. No congestion.     Mouth/Throat:     Mouth: Mucous membranes are dry.     Pharynx: Oropharynx is clear. No oropharyngeal exudate.  Eyes:     General:  Right eye: No discharge.        Left eye: No discharge.     Extraocular Movements: Extraocular movements intact.  Cardiovascular:     Rate and Rhythm: Normal rate and regular rhythm.     Heart sounds: Normal heart sounds. No murmur heard.   No gallop.  Pulmonary:     Effort: Pulmonary effort is normal. No respiratory distress.     Breath sounds: Normal breath sounds. No wheezing, rhonchi or rales.  Abdominal:     General: Bowel sounds are normal. There is no distension.     Palpations: Abdomen is soft.     Tenderness: There is no abdominal tenderness.  Musculoskeletal:     Cervical back: Normal range of motion. No rigidity.     Comments: UE strength 5/5 B/L  RLE- 5/5 LLE- 5-/5 in HF/KE/KF [name] with wound VAC in place TTP over L anterior shoulder c/w L bicipital tendinitis  Skin:    General: Skin is warm and dry.     Comments: [name] with wound VAC in place RUE PICC in place- looks OK  Neurological:     Mental Status: She is alert.     Comments:  Patient is alert.  No acute distress.  Oriented x3 and follows commands. Decreased to light touch below knee on RLE CN's OK B/L   Psychiatric:        Behavior: Behavior normal.     Comments: Anxious, and eager to work with therapy      Lab Results Last 48 Hours        Results for orders placed or performed during the hospital encounter of 02/17/21 (from the past 48 hour(s))  Glucose, capillary     Status: Abnormal    Collection Time: 03/03/21  7:45 AM  Result Value Ref Range    Glucose-Capillary 116 (H) 70 - 99 mg/dL      Comment: Glucose reference range applies only to samples taken after fasting for at least 8 hours.  Glucose, capillary     Status: Abnormal    Collection Time: 03/03/21 12:53 PM  Result Value Ref Range    Glucose-Capillary 158 (H) 70 - 99 mg/dL      Comment: Glucose reference range applies only to samples taken after fasting for at least 8 hours.  Glucose, capillary     Status: Abnormal    Collection Time: 03/03/21  4:21 PM  Result Value Ref Range    Glucose-Capillary 128 (H) 70 - 99 mg/dL      Comment: Glucose reference range applies only to samples taken after fasting for at least 8 hours.  Glucose, capillary     Status: Abnormal    Collection Time: 03/03/21  8:33 PM  Result Value Ref Range    Glucose-Capillary 149 (H) 70 - 99 mg/dL      Comment: Glucose reference range applies only to samples taken after fasting for at least 8 hours.  Glucose, capillary     Status: None    Collection Time: 03/04/21  7:37 AM  Result Value Ref Range    Glucose-Capillary 93 70 - 99 mg/dL      Comment: Glucose reference range applies only to samples taken after fasting for at least 8 hours.  Glucose, capillary     Status: None    Collection Time: 03/04/21 11:30 AM  Result Value Ref Range    Glucose-Capillary 95 70 - 99 mg/dL      Comment: Glucose reference range applies only  to samples taken after fasting for at least 8 hours.  Glucose, capillary     Status: Abnormal     Collection Time: 03/04/21  4:31 PM  Result Value Ref Range    Glucose-Capillary 200 (H) 70 - 99 mg/dL      Comment: Glucose reference range applies only to samples taken after fasting for at least 8 hours.  Glucose, capillary     Status: Abnormal    Collection Time: 03/04/21  5:57 PM  Result Value Ref Range    Glucose-Capillary 193 (H) 70 - 99 mg/dL      Comment: Glucose reference range applies only to samples taken after fasting for at least 8 hours.    Comment 1 Notify RN      Comment 2 Document in Chart    Glucose, capillary     Status: Abnormal    Collection Time: 03/04/21  8:55 PM  Result Value Ref Range    Glucose-Capillary 123 (H) 70 - 99 mg/dL      Comment: Glucose reference range applies only to samples taken after fasting for at least 8 hours.      Imaging Results (Last 48 hours)  No results found.           Medical Problem List and Plan: 1. 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  Antithrombotics: -DVT/anticoagulation:  Pharmaceutical: Lovenox.  Venous Doppler is negative             -antiplatelet therapy: N/A 3. Pain Management: due to L4/5 discitis- OxyContin sustained-release 10 mg every 12 hours,, Robaxin-750 milligram every 6 hours, Naprosyn 500 mg twice daily oxycodone as needed 4. Mood: Celexa 40 mg daily, Ambien 5 mg nightly.  Provide emotional support             -antipsychotic agents: N/A 5. Neuropsych: This patient is capable of making decisions on her own behalf. 6. Skin/Wound Care: Routine skin checks 7. Fluids/Electrolytes/Nutrition: Routine in and outs with follow-up chemistries 8.  ID/MSSA bacteremia.  Continue Ancef 2 g every 8 hours through 04/14/2021 and stop- per ID.  9.  Acute blood loss anemia.  Follow-up CBC 10.   Diabetes mellitus with peripheral neuropathy. A1c 13- had for 14 years;  NovoLog 8 units 3 times daily with meals, Levemir 55 units nightly, Tradjenta 5 mg daily, Glucophage 500 mg daily.  Check blood sugars before meals and at bedtime.  Diabetic teaching 11.  Hyperlipidemia.  Pravachol 12.  Obesity.  Dietary follow-up 13. L shoulder bicipital tendinitis- suggest starting Voltaren gel 2 G QID 14. [name]- went over risk of L hip and L knee contractures which will make prosthesis fitting more difficult- advised pt to lay on stomach or on side and stretch hip and knee/extend them multiple times per day.        I have personally performed a face to face diagnostic evaluation of this patient and formulated the key components of the plan.  Additionally, I have personally reviewed laboratory data, imaging studies, as well as relevant notes and concur with the physician assistant's documentation above.   The patient's status has not changed from the original H&P.  Any changes in documentation from the acute care chart have been  noted above.         Mcarthur Rossetti Angiulli, PA-C 03/05/2021

## 2021-03-05 NOTE — PMR Pre-admission (Signed)
PMR Admission Coordinator Pre-Admission Assessment  Patient: Lori Schmidt is an 45 y.o., female MRN: [id number] DOB: 05-02-1975 Height: 5\' 7"  (170.2 cm) Weight: 121.2 kg  Insurance Information HMO: yes     PPO:      PCP:      IPA:      80/20:      OTHER:  PRIMARY: [facility]      Policy#: [id number]      Subscriber: Pt.  CM Name: Luisa Hart      Phone#: [phone]     Fax#: [phone]  I received a fax from [name] at [facility] stating that Pt. Was authorized for admission 03/03/21 through 03/09/10 with updates due 09/38/18 Pre-Cert#: [id number]       Employer:  Benefits:  Phone #: [phone]     Name:  Irene Shipper Date: 04/11/2020 - 04/10/2021 Deductible: $1,200 ($1,200 met) OOP Max: Individual $6,000 ($1,989.90 met) CIR: 80% coverage, 20% co-insurance SNF: 80% coverage, 20% co-insurance Outpatient: $50 copay Home Health:  100% coverage; limited to 36 visits/cal yr (77 remaining) DME: 100% coverage, auth required Providers: in network  SECONDARY: none       Policy#:      Phone#:   Development worker, community:       Phone#:   The Engineer, petroleum" for patients in Inpatient Rehabilitation Facilities with attached "Privacy Act [place] Records" was provided and verbally reviewed with: Patient  Emergency Contact Information Contact Information     Name Relation Home Work Mobile   [name],[name] Mother   [phone]       Current Medical History  Patient Admitting Diagnosis: [name] History of Present Illness: Lori Schmidt is a 45 year old right-handed female with history of hypertension, hyperlipidemia, obesity as well as diabetes mellitus, quit smoking 10 years ago.  Per chart review patient lives with her 63 year old daughter.  1 level home 5 steps to entry.  She works at [employer].  Independent prior to admission.  She does have a mother in the area.  Presented 02/17/2021 with increasing left lower extremity swelling and decreased functional  gait as well as spiked a low-grade fever, nausea and vomiting and general malaise.  She denied any injury.  Patient had initially been seen at [facility] 02/11/2021 for left lower extremity swelling plan was for a venous Doppler however patient left AMA.  In the ED 02/16/2021 venous Dopplers completed of left lower extremity showing no DVT and she received antibiotic therapy and was discharged home with blood cultures pending.  The following day patient was contacted blood cultures positive MSSA and she returned to the ED.  In the ED patient tachycardic 118 potassium 2.7 glucose 335 sodium 128, WBC 8600, hemoglobin 10.8, lactic acid 1.2, urine drug screen negative.  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  Numerous peripherally enhancing joint effusions throughout the left midfoot.  Extensive tenosynovial fluid collections throughout the flexor and extensor tendons of the left foot compatible with infectious tenosynovitis.  Orthopedic service Dr. Sharol Given consulted patient underwent left transtibial amputation application of wound VAC 02/19/2021.  Patient was maintained on antibiotic therapy with Ancef per infectious disease through 04/14/2021.  TEE completed showing no evidence of endocarditis.  Patient with persistent low back pain with MRI completed 02/20/2021 showing edema L4-L5 disc with adjacent inferior L4 and superior L5 endplate edema and enhancement concerning for discitis/osteomyelitis and again repeated MRI 02/28/2021 redemonstrating marrow edema L4-5 degree of signal change  slightly progressed compared to previous MRI.  Scans were reviewed by infectious disease recommendations were to continue intravenous antibiotics as directed.  Subcutaneous Lovenox for DVT prophylaxis.  Acute blood loss anemia 9.7 and monitored.  Therapy evaluations completed due to patient decreased functional mobility was admitted for a comprehensive  rehab program.    Patient's medical record from [facility] has been reviewed by the rehabilitation admission coordinator and physician.  Past Medical History  Past Medical History:  Diagnosis Date   Hyperlipidemia    Hypertension    Obesity    Type 2 diabetes mellitus ([place])     Has the patient had major surgery during 100 days prior to admission?   Family History   family history includes Breast cancer in her paternal grandmother; Diabetes in her maternal grandmother and mother; Hypothyroidism in her mother.  Current Medications  Current Facility-Administered Medications:    (feeding supplement) PROSource Plus liquid 30 mL, 30 mL, Oral, BID BM, Velna Ochs, MD, 30 mL at 03/05/21 0905   0.9 %  sodium chloride infusion, , Intravenous, Continuous, Gaylan Gerold, DO, Stopped at 02/28/21 2203   acetaminophen (TYLENOL) tablet 325-650 mg, 325-650 mg, Oral, Q6H PRN, Newt Minion, MD, 650 mg at 03/02/21 0514   alum & mag hydroxide-simeth (MAALOX/MYLANTA) 200-200-20 MG/5ML suspension 15-30 mL, 15-30 mL, Oral, Q2H PRN, Newt Minion, MD   ascorbic acid (VITAMIN C) tablet 1,000 mg, 1,000 mg, Oral, Daily, Newt Minion, MD, 1,000 mg at 03/05/21 7939   bisacodyl (DULCOLAX) EC tablet 5 mg, 5 mg, Oral, Daily PRN, Newt Minion, MD, 5 mg at 02/24/21 1516   ceFAZolin (ANCEF) IVPB 2g/100 mL premix, 2 g, Intravenous, Q8H, Newt Minion, MD, Last Rate: 200 mL/hr at 03/05/21 0525, 2 g at 03/05/21 0300   Chlorhexidine Gluconate Cloth 2 % PADS 6 each, 6 each, Topical, Daily, Velna Ochs, MD, 6 each at 03/04/21 2224   citalopram (CELEXA) tablet 40 mg, 40 mg, Oral, Daily, Newt Minion, MD, 40 mg at 03/05/21 9233   docusate sodium (COLACE) capsule 100 mg, 100 mg, Oral, Daily, Newt Minion, MD, 100 mg at 03/03/21 1000   enoxaparin (LOVENOX) injection 60 mg, 60 mg, Subcutaneous, Q24H, Newt Minion, MD, 60 mg at 03/05/21 0905   feeding supplement (ENSURE ENLIVE / ENSURE  PLUS) liquid 237 mL, 237 mL, Oral, BID, Atway, Rayann N, DO, 237 mL at 03/05/21 0910   guaiFENesin-dextromethorphan (ROBITUSSIN DM) 100-10 MG/5ML syrup 15 mL, 15 mL, Oral, Q4H PRN, Newt Minion, MD, 15 mL at 02/22/21 1154   hydrALAZINE (APRESOLINE) injection 5 mg, 5 mg, Intravenous, Q20 Min PRN, Newt Minion, MD   insulin aspart (novoLOG) injection 0-20 Units, 0-20 Units, Subcutaneous, TID WC, Newt Minion, MD, 3 Units at 03/05/21 0850   insulin aspart (novoLOG) injection 0-5 Units, 0-5 Units, Subcutaneous, QHS, Newt Minion, MD, 2 Units at 02/25/21 2139   insulin aspart (novoLOG) injection 8 Units, 8 Units, Subcutaneous, TID WC, Atway, Rayann N, DO, 8 Units at 03/04/21 1759   insulin detemir (LEVEMIR) injection 55 Units, 55 Units, Subcutaneous, QHS, Atway, Rayann N, DO, 55 Units at 03/04/21 2222   linagliptin (TRADJENTA) tablet 5 mg, 5 mg, Oral, Daily, Atway, Rayann N, DO, 5 mg at 03/05/21 0907   metFORMIN (GLUCOPHAGE) tablet 1,000 mg, 1,000 mg, Oral, BID WC, Atway, Rayann N, DO, 1,000 mg at 03/05/21 0851   methocarbamol (ROBAXIN) tablet 750 mg, 750 mg, Oral, Q6H, Gaylan Gerold, DO, 750  mg at 03/05/21 1148   naproxen (NAPROSYN) tablet 500 mg, 500 mg, Oral, BID WC, Corky Sox, MD, 500 mg at 03/05/21 0851   ondansetron [facility]) injection 4 mg, 4 mg, Intravenous, Q6H PRN, Newt Minion, MD, 4 mg at 03/03/21 0250   oxyCODONE (Oxy IR/ROXICODONE) immediate release tablet 5 mg, 5 mg, Oral, Q8H PRN, Atway, Rayann N, DO   oxyCODONE (OXYCONTIN) 12 hr tablet 10 mg, 10 mg, Oral, Q12H, Atway, Rayann N, DO, 10 mg at 03/05/21 0907   pantoprazole (PROTONIX) EC tablet 40 mg, 40 mg, Oral, Daily, Newt Minion, MD, 40 mg at 03/05/21 0906   phenol (CHLORASEPTIC) mouth spray 1 spray, 1 spray, Mouth/Throat, PRN, Newt Minion, MD   polyethylene glycol (MIRALAX / GLYCOLAX) packet 17 g, 17 g, Oral, Daily, Gaylan Gerold, DO, 17 g at 03/05/21 0904   pravastatin (PRAVACHOL) tablet 40 mg, 40 mg, Oral, Daily,  Atway, Rayann N, DO, 40 mg at 03/05/21 0906   sodium chloride flush (NS) 0.9 % injection 10-40 mL, 10-40 mL, Intracatheter, Q12H, Guilloud, Hoyle Sauer, MD, 10 mL at 03/05/21 0909   sodium chloride flush (NS) 0.9 % injection 10-40 mL, 10-40 mL, Intracatheter, PRN, Velna Ochs, MD, 10 mL at 03/03/21 0424   vitamin B-12 (CYANOCOBALAMIN) tablet 500 mcg, 500 mcg, Oral, QODAY, Newt Minion, MD, 500 mcg at 03/04/21 1047   zolpidem (AMBIEN) tablet 5 mg, 5 mg, Oral, QHS, Atway, Rayann N, DO, 5 mg at 03/04/21 2223  Patients Current Diet:  Diet Order             Diet Carb Modified Fluid consistency: Thin; Room service appropriate? Yes  Diet effective now                   Precautions / Restrictions Precautions Precautions: Fall Precaution Comments: mod fall Other Brace: limb protector Restrictions Weight Bearing Restrictions: Yes LLE Weight Bearing: Non weight bearing   Has the patient had 2 or more falls or a fall with injury in the past year? No  Prior Activity Level Community (5-7x/wk): Pt. was active in the community PTA  Prior Functional Level Self Care: Did the patient need help bathing, dressing, using the toilet or eating? Independent  Indoor Mobility: Did the patient need assistance with walking from room to room (with or without device)? Independent  Stairs: Did the patient need assistance with internal or external stairs (with or without device)? Independent  Functional Cognition: Did the patient need help planning regular tasks such as shopping or remembering to take medications? Independent  Patient Information Are you of Hispanic, Latino/a,or Spanish origin?: A. No, not of Hispanic, Latino/a, or Spanish origin What is your race?: A. White Do you need or want an interpreter to communicate with a doctor or health care staff?: 0. No  Patient's Response To:  Health Literacy and Transportation Is the patient able to respond to health literacy and transportation  needs?: Yes Health Literacy - How often do you need to have someone help you when you read instructions, pamphlets, or other written material from your doctor or pharmacy?: Never In the past 12 months, has lack of transportation kept you from medical appointments or from getting medications?: No In the past 12 months, has lack of transportation kept you from meetings, work, or from getting things needed for daily living?: No  Home Assistive Devices / [place] Devices/Equipment: Environmental consultant (specify type) (FWW) Home Equipment: Rolling Walker (2 wheels), Tub bench  Prior Device Use: Indicate devices/aids  used by the patient prior to current illness, exacerbation or injury? None of the above  Current Functional Level Cognition  Overall Cognitive Status: Within Functional Limits for tasks assessed Difficult to assess due to: Level of arousal Orientation Level: Oriented X4 Following Commands: Follows one step commands consistently Safety/Judgement: Decreased awareness of safety, Decreased awareness of deficits General Comments: patient more calm with transfrs and exhibits less anxiety    Extremity Assessment (includes Sensation/Coordination)  Upper Extremity Assessment: LUE deficits/detail LUE Deficits / Details: sensitive to touch, however able to push and pull with shoulder without indication of pain LUE Sensation:  (extra sensitive) LUE Coordination: WNL  Lower Extremity Assessment: Defer to PT evaluation LLE Deficits / Details: Limited movement throughout, reports sensation intact. LLE: Unable to fully assess due to pain LLE Sensation: WNL LLE Coordination: decreased fine motor, decreased gross motor    ADLs  Overall ADL's : Needs assistance/impaired Eating/Feeding: Set up, Bed level (HOb elevated) Grooming: Wash/dry face, Brushing hair, Set up, Sitting Grooming Details (indicate cue type and reason): lateral lean to right, able to off weight her R arm to use for  grooming tasks Upper Body Bathing: Minimal assistance Upper Body Bathing Details (indicate cue type and reason): for back sitting EOB Lower Body Bathing: Maximal assistance, Bed level Upper Body Dressing : Moderate assistance Lower Body Dressing: Maximal assistance, Bed level Lower Body Dressing Details (indicate cue type and reason): reposition limb guard Toilet Transfer: Maximal assistance, +2 for safety/equipment, +2 for physical assistance, Requires drop arm, Requires wide/bariatric Toilet Transfer Details (indicate cue type and reason): failed attempt at lateral transfer today - she will need to continue to use bed pain at this time Toileting- Clothing Manipulation and Hygiene: Maximal assistance, +2 for safety/equipment, Bed level Toileting - Clothing Manipulation Details (indicate cue type and reason): rolling for peri care Functional mobility during ADLs: Maximal assistance, Total assistance, +2 for physical assistance, +2 for safety/equipment (attempted lateral transfer) General ADL Comments: Pt much better cognitively, but get internally distracted by pain which impacts her ability to perform transfers essential to ADL- Pt DOES put forth good effort and is motivated    Mobility  Overal bed mobility: Needs Assistance Bed Mobility: Rolling, Sidelying to Sit Rolling: Min guard Sidelying to sit: Min assist Sit to supine: +2 for physical assistance, HOB elevated, Total assist Sit to sidelying: +2 for safety/equipment, +2 for physical assistance, Mod assist General bed mobility comments: Pt able to come to EOB with directional cues and use of rail.    Transfers  Overall transfer level: Needs assistance Equipment used: Rolling walker (2 wheels) Transfers: Sit to/from Stand Sit to Stand: Mod assist, +2 physical assistance Bed to/from chair/wheelchair/BSC transfer type:: Stand pivot Stand pivot transfers: Mod assist, +2 safety/equipment  Lateral/Scoot Transfers: Mod assist, +2 physical  assistance, +2 safety/equipment, From elevated surface Transfer via Lift Equipment: Stedy General transfer comment: patient stood from recliner with mod assist +2 with on second attempt and performed stand pivot transfer to EOB    Ambulation / Gait / Stairs / Wheelchair Mobility  Ambulation/Gait General Gait Details: unable to stand    Posture / Balance Dynamic Sitting Balance Sitting balance - Comments: patient asked to remain sitting on EOB with setup for grooming Balance Overall balance assessment: Needs assistance Sitting-balance support: Single extremity supported, Bilateral upper extremity supported Sitting balance-Leahy Scale: Fair Sitting balance - Comments: patient asked to remain sitting on EOB with setup for grooming Postural control: Posterior lean Standing balance support: Bilateral upper extremity supported Standing  balance-Leahy Scale: Poor Standing balance comment: stood with RW for transfer to EOB    Special needs/care consideration Continuous Drip IV  abx and Skin surgical incision, excoiration on BUEs, Weeping on L distal foot   Previous Home Environment (from acute therapy documentation) Living Arrangements: Children (43 y/o daughter)  Lives With: Spouse Available Help at Discharge: Family, Available PRN/intermittently Type of Home: House Home Layout: One level Home Access: Stairs to enter Entrance Stairs-Rails: Right, Left, Can reach both Entrance Stairs-Number of Steps: 5-6 Bathroom Shower/Tub: Multimedia programmer: Standard Bathroom Accessibility: Yes How Accessible: Accessible via walker [place]: No  Discharge Living Setting Plans for Discharge Living Setting: Patient's home Type of Home at Discharge: House Discharge Home Layout: One level Discharge Home Access: Stairs to enter Entrance Stairs-Rails: Right, Left, Can reach both Entrance Stairs-Number of Steps: 5-6 Discharge Bathroom Shower/Tub: Walk-in shower Discharge Bathroom  Toilet: Standard Discharge Bathroom Accessibility: Yes How Accessible: Accessible via walker Does the patient have any problems obtaining your medications?: No  Social/Family/Support Systems Patient Roles: Parent Contact Information: [phone] Anticipated Caregiver: Iven Finn Anticipated Caregiver's Contact Information: [phone] Ability/Limitations of Caregiver: min A Caregiver Availability: 24/7 Discharge Plan Discussed with Primary Caregiver: Yes Is Caregiver In Agreement with Plan?: Yes Does Caregiver/Family have Issues with Lodging/Transportation while Pt is in Rehab?: No  Goals Patient/Family Goal for Rehab: PT/OT Supervision Expected length of stay: 10-12 days Pt/Family Agrees to Admission and willing to participate: Yes Program Orientation Provided & Reviewed with Pt/Caregiver Including Roles  & Responsibilities: Yes  Decrease burden of Care through IP rehab admission: Specialzed equipment needs, Decrease number of caregivers, Bowel and bladder program, and Patient/family education  Possible need for SNF placement upon discharge: not anticipated   Patient Condition: I have reviewed medical records from [facility], spoken with CM, and patient and family member. I met with patient at the bedside for inpatient rehabilitation assessment.  Patient will benefit from ongoing PT and OT, can actively participate in 3 hours of therapy a day 5 days of the week, and can make measurable gains during the admission.  Patient will also benefit from the coordinated team approach during an Inpatient Acute Rehabilitation admission.  The patient will receive intensive therapy as well as Rehabilitation physician, nursing, social worker, and care management interventions.  Due to safety, skin/wound care, disease management, medication administration, pain management, and patient education the patient requires 24 hour a day rehabilitation nursing.  The patient is currently min  A+2  with mobility and basic ADLs.  Discharge setting and therapy post discharge at home with home health is anticipated.  Patient has agreed to participate in the Acute Inpatient Rehabilitation Program and will admit today.  Preadmission Screen Completed By:  Genella Mech, 03/05/2021 11:57 AM ______________________________________________________________________   Discussed status with Dr. Dagoberto Ligas  on 03/05/21 at 87 and received approval for admission today.  Admission Coordinator:  Genella Mech, [place], time 2440 /Date 03/05/21   Assessment/Plan: Diagnosis: Does the need for close, 24 hr/day Medical supervision in concert with the patient's rehab needs make it unreasonable for this patient to be served in a less intensive setting? 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 Yes Does the patient require coordinated care of a physician, rehab nurse, PT, OT, and SLP to  address physical and functional deficits in the context of the above medical diagnosis(es)? Yes Addressing deficits in the following areas: balance, endurance, locomotion, strength, transferring, bowel/bladder control, bathing, dressing, feeding, grooming, and toileting Can the patient actively participate in an intensive therapy program of at least 3 hrs of therapy 5 days a week? Yes The potential for patient to make measurable gains while on inpatient rehab is good Anticipated functional outcomes upon discharge from inpatient rehab: supervision PT, supervision OT, n/a SLP Estimated rehab length of stay to reach the above functional goals is: 10-12 days Anticipated discharge destination: Home 10. Overall Rehab/Functional Prognosis: good   MD  Signature:

## 2021-03-05 NOTE — Discharge Summary (Signed)
Name: Lori Schmidt MRN: [id number] DOB: 1975-07-05 45 y.o. PCP: Lori Polio, NP  Date of Admission: 02/17/2021  6:53 PM Date of Discharge:  03/05/2021 Attending Physician: Dr. Angelia Mould  DISCHARGE DIAGNOSIS:  Primary Problem: Discitis of lumbar region   Hospital Problems: Principal Problem:   Discitis of lumbar region Active Problems:   Bacteremia due to Staphylococcus aureus   Diabetes mellitus ([place])   Left leg cellulitis   Subacute osteomyelitis, left ankle and foot (HCC)   Pressure injury of skin   Adjustment disorder with mixed anxiety and depressed mood    DISCHARGE MEDICATIONS:   Allergies as of 03/05/2021       Reactions   Sulfa Antibiotics Other (See Comments)   Makes sick   Lisinopril Cough        Medication List     STOP taking these medications    cephALEXin 500 MG capsule Commonly known as: KEFLEX   ibuprofen 200 MG tablet Commonly known as: ADVIL   Januvia 100 MG tablet Generic drug: sitaGLIPtin       TAKE these medications    ceFAZolin  IVPB Commonly known as: ANCEF Inject 2 g into the vein every 8 (eight) hours. Indication: MSSA bacteremia/discitis First Dose: Yes Last Day of Therapy: 04/16/2021 Labs - Once weekly:  CBC/D and BMP, Labs - Every other week:  ESR and CRP Method of administration: IV Push Method of administration may be changed at the discretion of home infusion pharmacist based upon assessment of the patient and/or caregiver's ability to self-administer the medication ordered.   citalopram 40 MG tablet Commonly known as: CELEXA Take 40 mg by mouth at bedtime.   DEPO-PROVERA IM Inject into the muscle every 3 (three) months.   insulin aspart 100 UNIT/ML injection Commonly known as: novoLOG Inject 8 Units into the skin 3 (three) times daily with meals.   insulin detemir 100 UNIT/ML injection Commonly known as: LEVEMIR Inject 0.55 mLs (55 Units total) into the skin at bedtime. What changed: how much  to take   linagliptin 5 MG Tabs tablet Commonly known as: TRADJENTA Take 1 tablet (5 mg total) by mouth daily. Start taking on: March 06, 2021   losartan 25 MG tablet Commonly known as: COZAAR Take 25 mg by mouth at bedtime.   magnesium oxide 400 MG tablet Commonly known as: MAG-OX Take 400 mg by mouth daily.   metFORMIN 1000 MG tablet Commonly known as: GLUCOPHAGE Take 1 tablet (1,000 mg total) by mouth 2 (two) times daily with a meal. What changed:  how much to take when to take this   methocarbamol 750 MG tablet Commonly known as: ROBAXIN Take 1 tablet (750 mg total) by mouth every 6 (six) hours as needed for up to 7 days for muscle spasms.   naproxen 500 MG tablet Commonly known as: NAPROSYN Take 1 tablet (500 mg total) by mouth 2 (two) times daily with a meal for 7 days.   ondansetron 8 MG tablet Commonly known as: ZOFRAN Take 8 mg by mouth every 8 (eight) hours as needed for nausea or vomiting.   oxyCODONE 10 mg 12 hr tablet Commonly known as: OXYCONTIN Take 1 tablet (10 mg total) by mouth every 12 (twelve) hours for 5 days.   pravastatin 40 MG tablet Commonly known as: PRAVACHOL Take 1 tablet (40 mg total) by mouth daily. Start taking on: March 06, 2021 What changed:  medication strength how much to take when to take this   vitamin B-12 500  MCG tablet Commonly known as: CYANOCOBALAMIN Take 500 mcg by mouth daily.   zolpidem 5 MG tablet Commonly known as: AMBIEN Take 1 tablet (5 mg total) by mouth at bedtime.               Discharge Care Instructions  (From admission, onward)           Start     Ordered   03/05/21 0000  Change dressing on IV access line weekly and PRN  (Home infusion instructions - [facility] )        03/05/21 1147   03/05/21 0000  Leave dressing on - Keep it clean, dry, and intact until clinic visit        03/05/21 [place] FOLLOW-UP:  [name] was  discharged from [facility] in Stable condition. At the hospital follow up visit please address:  Follow-up Recommendations: Consults: Infectious disease, orthopedic surgery Labs: Basic Metabolic Profile (reassess Cr), Blood sugar, A1c Studies: None Medications: IV ancef through 04/14/21 Levemir 55u nightly Novolog 8u three times daily with meals Tradjenta 5 mg daily Metformin 1000 mg bid  Oxycontin 10 mg bid x 5 days Naproxen 500 mg bid Robaxin prn   Ambien 5 mg qhs   Follow-up Appointments:  Follow-up Information     Newt Minion, MD Follow up in 1 week(s).   Specialty: Orthopedic Surgery Contact information: [place] [address] [place] Follow up.   Why: please call to make an appt once you finish rehab Contact information: [address] [phone]        Laurice Record, MD .   Specialty: Internal Medicine Contact information: [place], [place]  [address] [phone]                  HOSPITAL COURSE:  Patient Summary: Patient Summary  Lori Schmidt is a 45 y.o. female with a history of uncontrolled DM, HTN, HLD, and depression who presented to the ED with [name] complicated by osteomyelitis and MSSA bacteremia. Hospital course is outlined below.   ED Course  Patient first presented to the ED on 11/8 with 1 month of worsening [name]. LE doppler negative for DVT, blood cultures grew MSSA after 1 day, patient subsequently instructed to return to the ED. In the ED, patient was afebrile, tachycardic to 118, normotensive, normal O2 saturation. Labs notable for low potassium 2.7, glucose 335 with anion gap of 8 and bicarb 17, sodium low at 128. CBC without leukocytosis with normocytic anemia with hemoglobin 10.8.  Lactic acid normal at 1.2. X-ray of left foot without acute fracture dislocation,  with diffuse soft tissue swelling and subcutaneous edema of the dorsum of the foot. Patient received dose of Ancef and 1 L fluid bolus in the ED.  #MSSA Bacteremia  #[name]  #Osteomyelitis #S/p L transtibial amputation #Discitis  Patient was admitted on 11/9 and ID was consulted due to MSSA bacteremia and patient was started on IV Ancef. TTE was negative for endocarditis. On 11/10 patient appeared to have worsening cellulitis prompting STAT MRI. MRI of left foot notable for marked diffuse cellulitis, extensive acute osteomyelitis throughout the left hindfoot and forefoot with multifocal bony involvement, midfoot findings compatible with multifocal septic arthritis and adjacent abscesses, and extensive tenosynovial fluid collections  throughout the flexor and extensor tendons of the left foot, compatible with infectious tenosynovitis. MRI of left tibia and fibula with findings of diffuse cellulitis and myofasciitis, mild tenosynovitis, and no evidence of osteomyelitis. Dr. Sharol Given from orthopedic surgery was consulted. Patient underwent L transtibial amputation on 70/48 with no complications. Patient began complaining of shoulder pain and back pain, with back MRI on 11/12 notable for findings concerning for discitis/osteomyelitis. Patient was started on an 8 week course of IV Ancef with end of treatment on 04/14/21. Shoulder pain reportedly chronic and shoulder CXR negative. Orthopedic surgery recommended neurosurgery consult. Neurosurgery recommended no surgical intervention and to continue antimicrobial therapy and check CRP and ESR. TEE on 11/15 had no evidence of endocarditis and showed normal LVEF of 60-65% with no wall motion abnormalities. PICC line was placed on 11/17. IV pain meds were stopped on 11/18. At time of discharge, patient was medically stable, and sent with short course of oxycontin for pain management.   #DM  A1c 13. Patient's nightly Levemir 100U was divided into Levemir 50U BID to  decrease volume of administration and improve absorption. She was initially placed on Novolog 6U TID with meals with SSI and CBG monitoring. After surgery, patient's insulin regimen continued to be optimized to Levemir 55U qhs and Novolog 8U TID. She was restarted on home Tradjenta 5 mg and metformin 500 mg daily on 11/18. Metformin was increased to 1000 mg bid on discharge. Will be discharged with this regimen and recommend close follow up with PCP.  #Hypokalemia Patient's initial K+ of 2.7 was repleted with oral and IV K+ and normalized on 11/11.   #HTN  #HLD  #Elevated b-hCG  Initially patient's beta-hCG was mildly elevated to 11.3 so losartan and pravastatin were held due to concern for teratogenicity. Repeat beta-hcg was <1. Patient was restarted on pravastatin with increased dose of 58m for primary ASCVD prevention. Losartan was held due to normotensive BP.   #Depression  #Insomnia Patient was continued on Celexa 41mpo daily. She was also started on ambien 5 mg on 11/23 to help the patient sleep.  Follow-Up  PT and OT recommended discharge to CIR. Patient desires new PCP, [facility]/21 at 10:45 AM.    DISCHARGE INSTRUCTIONS:   Discharge Instructions     [facility] pharmacist to adjust dose for Vancomycin, Aminoglycosides and other anti-infective therapies as requested by physician.   Complete by: As directed    [facility] to provide Cath Flo 57m20m Complete by: As directed    Administer for PICC line occlusion and as ordered by physician for other access device issues.   Anaphylaxis Kit: Provided to treat any anaphylactic reaction to the medication being provided to the patient if First Dose or when requested by physician   Complete by: As directed    Epinephrine 1mg75m vial / amp: Administer 0.3mg 357m3ml) 71mcutaneously once for moderate to severe anaphylaxis, nurse to call physician and pharmacy when reaction  occurs and call 911 if needed for immediate care   Diphenhydramine 50mg/m65m vial: Administer 25-50mg IV35mPRN for first dose reaction, rash, itching, mild reaction, nurse to call physician and pharmacy when reaction occurs   Sodium Chloride 0.9% NS 500ml IV:35minister if needed for hypovolemic blood pressure drop or as ordered by physician after call to physician with anaphylactic reaction   Call MD for:  redness, tenderness, or signs of infection (pain, swelling, redness, odor or green/yellow discharge  around incision site)   Complete by: As directed    Call MD for:  temperature >100.4   Complete by: As directed    Change dressing on IV access line weekly and PRN   Complete by: As directed    Diet Carb Modified   Complete by: As directed    Flush IV access with Sodium Chloride 0.9% and Heparin 10 units/ml or 100 units/ml   Complete by: As directed    Home infusion instructions - [facility]   Complete by: As directed    Instructions: Flush IV access with Sodium Chloride 0.9% and Heparin 10units/ml or 100units/ml   Change dressing on IV access line: Weekly and PRN   Instructions Cath Flo 54m: Administer for PICC Line occlusion and as ordered by physician for other access device   [facility] pharmacist to adjust dose for: Vancomycin, Aminoglycosides and other anti-infective therapies as requested by physician   Increase activity slowly   Complete by: As directed    Leave dressing on - Keep it clean, dry, and intact until clinic visit   Complete by: As directed    Method of administration may be changed at the discretion of home infusion pharmacist based upon assessment of the patient and/or caregiver's ability to self-administer the medication ordered   Complete by: As directed       Dear Ms. Cid,  You were hospitalized for osteomyelitis in your left leg and lumbar spine. You will continue to receive your IV antibiotics (ancef) through 04/14/21. You have a  follow up appt with the infectious disease doctors on 12/21 regarding this.   Please also call the internal medicine center at [phone] you finish rehab, so you can schedule a hospital follow up with one of the doctors and so you can also establish care with [facility] We can help to optimize your diabetes medications, and help take care of your other chronic issues.   I am glad you are feeling better and wish you the best of luck in rehab!  If you have any questions or concerns, call our clinic at [phone] after hours call [phone] and ask for the internal medicine resident on call.   SUBJECTIVE:  MOlivia Pavelkowas seen and evaluated on the day of discharge. She had no back spasms overnight and her pain is much improved today. She was able to sit on the bedside commode on her own, without any issues.   Discharge Vitals:   BP 103/70 (BP Location: Left Arm)   Pulse 99   Temp 97.8 F (36.6 C) (Oral)   Resp 19   Ht _0  (1.702 m)   Wt 121.2 kg   SpO2 97%   BMI 41.85 kg/m   OBJECTIVE:  General: No acute distress. Head: Normocephalic. Atraumatic. CV: RRR. No murmurs, rubs, or gallops. Pulmonary: Lungs CTAB. Normal effort. No wheezing or rales. Abdominal: Soft, nontender, nondistended. Normal bowel sounds. Extremities: S/p L transtibial amputation with wound vac still in place.  Skin: Warm and dry. No obvious rash or lesions. Neuro: A&Ox3. No focal deficit. Psych: Normal mood and affect   Pertinent Labs, Studies, and Procedures:  CBC Latest Ref Rng & Units 03/03/2021 03/01/2021 02/27/2021  WBC 4.0 - 10.5 K/uL 7.8 6.0 5.4  Hemoglobin 12.0 - 15.0 g/dL 9.7(L) 9.0(L) 8.5(L)  Hematocrit 36.0 - 46.0 % 31.0(L) 28.8(L) 27.4(L)  Platelets 150 - 400 K/uL 419(H) 370 313    CMP Latest Ref Rng & Units 03/03/2021 03/02/2021 02/26/2021  Glucose 70 - 99 mg/dL 167(H) 105(H) 243(H)  BUN 6 - 20 mg/dL 22(H) 15 11  Creatinine 0.44 - 1.00 mg/dL 0.32(L) 0.45 0.44  Sodium 135 -  145 mmol/L 138 137 137  Potassium 3.5 - 5.1 mmol/L 3.9 4.0 4.2  Chloride 98 - 111 mmol/L 102 100 105  CO2 22 - 32 mmol/L _0 Calcium 8.9 - 10.3 mg/dL 8.4(L) 8.6(L) 7.8(L)  Total Protein 6.5 - 8.1 g/dL - 6.4(L) -  Total Bilirubin 0.3 - 1.2 mg/dL - 0.8 -  Alkaline Phos 38 - 126 U/L - 98 -  AST 15 - 41 U/L - 13(L) -  ALT 0 - 44 U/L - 9 -    MR FOOT LEFT W WO CONTRAST  Result Date: 02/18/2021 CLINICAL DATA:  Diabetic foot swelling. EXAM: MRI OF THE LEFT FOOT WITHOUT AND WITH CONTRAST TECHNIQUE: Multiplanar, multisequence MR imaging of the left foot was performed both before and after administration of intravenous contrast. CONTRAST:  71m GADAVIST GADOBUTROL 1 MMOL/ML IV SOLN COMPARISON:  X-ray 02/17/2021 FINDINGS: Bones/Joint/Cartilage Markedly abnormal appearance of the osseous structures of the left foot with extensive bone marrow edema and enhancement. Extensive confluent low T1 marrow signal changes involving multiple bones, most notably the second, third, and fourth metatarsals as well as the medial, intermediate, and lateral cuneiform bones and the calcaneus and navicular bones. Less prominent signal changes are also present in the cuboid and fifth metatarsal base as well as the talar neck. Bony erosive changes, predominantly along the dorsal aspect of the midfoot. There are extensive joint effusions throughout the hindfoot and midfoot with peripheral rim enhancement, most compatible with septic arthritis. Trace tibiotalar and subtalar joint effusions are nonspecific although septic arthritis is suspected. Ligaments No definite acute ligamentous abnormality. Muscles and Tendons Findings of diffuse myofasciitis and probable myonecrosis throughout the intrinsic foot musculature. Extensive tenosynovial fluid collections involving the flexor and extensor tendons of the foot. Soft tissues Marked circumferential soft tissue swelling. Lobulated fluid collections throughout the left foot, most of  which are contiguous with adjacent joint spaces suggesting septic arthritis and associated soft tissue abscesses. IMPRESSION: 1. Extensive acute osteomyelitis throughout the left hindfoot and forefoot with multifocal bony involvement, as detailed above. 2. Numerous peripherally enhancing joint effusions throughout the left midfoot, many of which are contiguous with rim enhancing fluid collections in the adjacent soft tissues. Findings are compatible with multifocal septic arthritis and adjacent abscesses. 3. Extensive tenosynovial fluid collections throughout the flexor and extensor tendons of the left foot, compatible with infectious tenosynovitis. 4. Small tibiotalar and subtalar joint effusions, may be reactive or reflect septic arthritis. 5. Marked diffuse cellulitis of the left foot. These results will be called to the ordering clinician or representative by the Radiologist Assistant, and communication documented in the PACS or [employer] Electronically Signed   By: NDavina PokeD.O.   On: 02/18/2021 14:06   MR TIBIA FIBULA LEFT W WO CONTRAST  Result Date: 02/18/2021 CLINICAL DATA:  Diabetic foot swelling.  Lower extremity swelling EXAM: MRI OF LOWER LEFT EXTREMITY WITHOUT AND WITH CONTRAST TECHNIQUE: Multiplanar, multisequence MR imaging of the left tibia and fibula was performed both before and after administration of intravenous contrast. CONTRAST:  139mGADAVIST GADOBUTROL 1 MMOL/ML IV SOLN COMPARISON:  None. FINDINGS: Bones/Joint/Cartilage No acute fracture or dislocation of the left tibia or fibula. No erosion or site of bone destruction. No bone marrow edema. No periosteal elevation. Ligaments Grossly intact. Muscles and Tendons Small volume tenosynovial fluid collections  associated with the peroneal tendons, tibialis posterior tendon, as well as the flexor hallucis longus and flexor digitorum longus tendons at the posterior aspect of the ankle. Mild edema-like signal within the anterior  and posterior compartment musculature of the lower leg. No intramuscular fluid collection. Small amount of deep fascial fluid and edema with enhancement throughout the anterior and posterior compartments of the lower leg. No rim enhancing deep fascial fluid collection. Soft tissues Diffuse circumferential subcutaneous edema and soft tissue enhancement. No organized or drainable fluid collection within the lower leg. IMPRESSION: 1. Findings of diffuse cellulitis and myofasciitis throughout the left lower leg. No organized or rim-enhancing fluid collections. 2. Small volume tenosynovial fluid collections associated with the peroneal tendons, tibialis posterior tendon, as well as the flexor hallucis longus and flexor digitorum longus tendons at the posterior aspect of the ankle suggesting mild tenosynovitis, which could be reactive or infectious. 3. No evidence of osteomyelitis of the left tibia or fibula. Electronically Signed   By: Davina Poke D.O.   On: 02/18/2021 13:55   DG Foot Complete Left  Result Date: 02/17/2021 CLINICAL DATA:  Left foot infection. EXAM: LEFT FOOT - COMPLETE 3+ VIEW COMPARISON:  None. FINDINGS: No acute fracture or dislocation. Irregularity of the dorsal cortex of a tarsal bone, likely medial cuneiform. This appears chronic. An infectious process/osteomyelitis is less likely but not excluded clinical correlation recommended there is diffuse soft tissue swelling and subcutaneous edema of the dorsum of the foot. No radiopaque foreign object or soft tissue gas. IMPRESSION: 1. No acute fracture or dislocation. 2. Diffuse soft tissue swelling and subcutaneous edema of the dorsum of the foot. Findings may represent cellulitis. Clinical correlation is recommended. 3. Irregularity of the dorsal cortex of a tarsal bone, likely medial cuneiform. Electronically Signed   By: Anner Crete M.D.   On: 02/17/2021 19:48   ECHOCARDIOGRAM COMPLETE  Result Date: 02/18/2021    ECHOCARDIOGRAM  REPORT   Patient Name:   PURVI RUEHL [facility] Date of Exam: 02/18/2021 Medical Rec #:  [id number]             Height:       67.0 in Accession #:    [id number]            Weight:       267.2 lb Date of Birth:  [birth year]             BSA:          2.287 m Patient Age:    89 years              BP:           117/47 mmHg Patient Gender: F                     HR:           100 bpm. Exam Location:  Inpatient Procedure: 2D Echo, Cardiac Doppler and Color Doppler Indications:    Syncope R55  History:        Patient has no prior history of Echocardiogram examinations.  Sonographer:    Merrie Roof RDCS Referring Phys: [id number] [place]  1. Left ventricular ejection fraction, by estimation, is 60 to 65%. The left ventricle has normal function. The left ventricle has no regional wall motion abnormalities. There is mild asymmetric left ventricular hypertrophy of the posterior segment. Left ventricular diastolic parameters were normal.  2. Right ventricular systolic function is normal. The right ventricular size is  normal.  3. The mitral valve is normal in structure. No evidence of mitral valve regurgitation. No evidence of mitral stenosis.  4. The aortic valve is normal in structure. Aortic valve regurgitation is not visualized. No aortic stenosis is present.  5. The inferior vena cava is normal in size with greater than 50% respiratory variability, suggesting right atrial pressure of 3 mmHg. FINDINGS  Left Ventricle: Left ventricular ejection fraction, by estimation, is 60 to 65%. The left ventricle has normal function. The left ventricle has no regional wall motion abnormalities. The left ventricular internal cavity size was normal in size. There is  mild asymmetric left ventricular hypertrophy of the posterior segment. Left ventricular diastolic parameters were normal. Right Ventricle: The right ventricular size is normal. No increase in right ventricular wall thickness. Right ventricular systolic function  is normal. Left Atrium: Left atrial size was normal in size. Right Atrium: Right atrial size was normal in size. Pericardium: There is no evidence of pericardial effusion. Mitral Valve: The mitral valve is normal in structure. No evidence of mitral valve regurgitation. No evidence of mitral valve stenosis. Tricuspid Valve: The tricuspid valve is normal in structure. Tricuspid valve regurgitation is trivial. No evidence of tricuspid stenosis. Aortic Valve: The aortic valve is normal in structure. Aortic valve regurgitation is not visualized. No aortic stenosis is present. Aortic valve mean gradient measures 4.0 mmHg. Aortic valve peak gradient measures 7.4 mmHg. Aortic valve area, by VTI measures 2.83 cm. Pulmonic Valve: The pulmonic valve was normal in structure. Pulmonic valve regurgitation is not visualized. No evidence of pulmonic stenosis. Aorta: The aortic root is normal in size and structure. Venous: The inferior vena cava is normal in size with greater than 50% respiratory variability, suggesting right atrial pressure of 3 mmHg. IAS/Shunts: No atrial level shunt detected by color flow Doppler.  LEFT VENTRICLE PLAX 2D LVIDd:         4.80 cm   Diastology LVIDs:         3.40 cm   LV e' medial:    12.30 cm/s LV PW:         1.20 cm   LV E/e' medial:  10.6 LV IVS:        1.00 cm   LV e' lateral:   10.80 cm/s LVOT diam:     2.20 cm   LV E/e' lateral: 12.0 LV SV:         59 LV SV Index:   26 LVOT Area:     3.80 cm  RIGHT VENTRICLE RV Basal diam:  3.50 cm LEFT ATRIUM             Index        RIGHT ATRIUM           Index LA diam:        3.00 cm 1.31 cm/m   RA Area:     20.70 cm LA Vol (A2C):   61.7 ml 26.98 ml/m  RA Volume:   58.40 ml  25.53 ml/m LA Vol (A4C):   41.9 ml 18.32 ml/m LA Biplane Vol: 55.2 ml 24.13 ml/m  AORTIC VALVE AV Area (Vmax):    2.85 cm AV Area (Vmean):   2.83 cm AV Area (VTI):     2.83 cm AV Vmax:           136.00 cm/s AV Vmean:          93.800 cm/s AV VTI:  0.207 m AV Peak  Grad:      7.4 mmHg AV Mean Grad:      4.0 mmHg LVOT Vmax:         102.00 cm/s LVOT Vmean:        69.800 cm/s LVOT VTI:          0.154 m LVOT/AV VTI ratio: 0.74  AORTA Ao Root diam: 3.60 cm Ao Asc diam:  3.40 cm MITRAL VALVE MV Area (PHT): 4.63 cm     SHUNTS MV Decel Time: 164 msec     Systemic VTI:  0.15 m MV E velocity: 130.00 cm/s  Systemic Diam: 2.20 cm MV A velocity: 102.00 cm/s MV E/A ratio:  1.27 Kardie Tobb DO Electronically signed by Berniece Salines DO Signature Date/Time: 02/18/2021/3:15:05 PM    Final      Signed: Buddy Duty, D.O.  Internal Medicine Resident, PGY-1 [facility]  Pager: [phone] 12:11 PM, 03/05/2021

## 2021-03-05 NOTE — Progress Notes (Signed)
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 MRI done on 11/12 shows concern for infection in back at L4-5. PMH includes DM, HTN, obesity.    PT Comments    Pt was seen for bed mobility and transfers with pt demonstrating a better control of standing with R knee support.  Her use of UE's on the bed to push off with knee support got hip clearance consistently.  Reviewed bed ex's to use for between therapy sessions, but is expecting to transition to CIR this evening.  Follow along as pt tolerates for recovery of standing and transfers, and to work toward more independence for fitting of prosthesis on LLE when ready and medically cleared.  Reviewed need for limb protector and the multiple purposes it serves.   Recommendations for follow up therapy are one component of a multi-disciplinary discharge planning process, led by the attending physician.  Recommendations may be updated based on patient status, additional functional criteria and insurance authorization.  Follow Up Recommendations  Acute inpatient rehab (3hours/day)     Assistance Recommended at Discharge Frequent or constant Supervision/Assistance  Equipment Recommendations  Wheelchair (measurements PT);Wheelchair cushion (measurements PT)    Recommendations for Other Services Rehab consult     Precautions / Restrictions Precautions Precautions: Fall Required Braces or Orthoses: Other Brace Other Brace: limb protector Restrictions Weight Bearing Restrictions: Yes LLE Weight Bearing: Non weight bearing     Mobility  Bed Mobility Overal bed mobility: Needs Assistance Bed Mobility: Sidelying to Sit;Sit to Sidelying Rolling: Min guard Sidelying to sit: Min assist     Sit to sidelying:  Min assist General bed mobility comments: minor help and pt is motivated to move herself    Transfers Overall transfer level: Needs assistance Equipment used: Rolling walker (2 wheels);1 person hand held assist Transfers: Sit to/from Stand Sit to Stand: Max assist;From elevated surface          Lateral/Scoot Transfers: Min assist General transfer comment: sit to stand on side of bed after pt did partial stand on walker    Ambulation/Gait               General Gait Details: unable   Stairs             Wheelchair Mobility    Modified Rankin (Stroke Patients Only)       Balance Overall balance assessment: Needs assistance Sitting-balance support: Single extremity supported Sitting balance-Leahy Scale: Fair     Standing balance support: Bilateral upper extremity supported;During functional activity Standing balance-Leahy Scale: Poor                              Cognition Arousal/Alertness: Awake/alert Behavior During Therapy: WFL for tasks assessed/performed Overall Cognitive Status: Within Functional Limits for tasks assessed                                          Exercises General Exercises - Lower Extremity Ankle Circles/Pumps: AROM;Right;5 reps Quad Sets: AROM;10 reps Gluteal Sets: AROM;10 reps Heel Slides: AROM;10 reps Hip ABduction/ADduction: AAROM;AROM;10 reps    General Comments General comments (skin integrity, edema, etc.): pt is able to  help stand but is anxious about her ability to control standing.  Used suppport on R knee to stand at bedside to clear hips      Pertinent Vitals/Pain Pain Assessment: Faces Faces Pain Scale: Hurts little more Breathing: normal Negative Vocalization: none Facial Expression: smiling or inexpressive Body Language: relaxed Consolability: no need to console PAINAD Score: 0 Pain Descriptors / Indicators: Guarding    Home Living                           Prior Function            PT Goals (current goals can now be found in the care plan section) Acute Rehab PT Goals Patient Stated Goal: increase independence Progress towards PT goals: Progressing toward goals    Frequency    Min 3X/week      PT Plan Current plan remains appropriate    Co-evaluation              AM-PAC PT "6 Clicks" Mobility   Outcome Measure  Help needed turning from your back to your side while in a flat bed without using bedrails?: A Little Help needed moving from lying on your back to sitting on the side of a flat bed without using bedrails?: A Little Help needed moving to and from a bed to a chair (including a wheelchair)?: A Lot Help needed standing up from a chair using your arms (e.g., wheelchair or bedside chair)?: Total Help needed to walk in hospital room?: Total Help needed climbing 3-5 steps with a railing? : Total 6 Click Score: 11    End of Session Equipment Utilized During Treatment: Gait belt Activity Tolerance: Patient limited by fatigue;Patient limited by pain Patient left: in bed;with call bell/phone within reach;with bed alarm set Nurse Communication: Mobility status PT Visit Diagnosis: Other abnormalities of gait and mobility (R26.89);Pain;Difficulty in walking, not elsewhere classified (R26.2) Pain - Right/Left: Left Pain - part of body: Leg     Time: 2081-3887 PT Time Calculation (min) (ACUTE ONLY): 37 min  Charges:  $Therapeutic Exercise: 23-37 mins             Ivar Drape 03/05/2021, 6:57 PM  Samul Dada, PT PhD Acute Rehab Dept. Number: [facility] [id number] and [facility] [phone]

## 2021-03-05 NOTE — TOC Transition Note (Signed)
Transition of Care [facility]) - CM/SW Discharge Note   Patient Details  Name: Eriyonna Matsushita MRN: [id number] Date of Birth: [birth year]  Transition of Care [facility]) CM/SW Contact:  Kermit Balo, RN Phone Number: 03/05/2021, 2:00 PM   Clinical Narrative:    Patient is discharging to CIR today. CM signing off.    Final next level of care: IP Rehab Facility Barriers to Discharge: No Barriers Identified   Patient Goals and CMS Choice     Choice offered to / list presented to : Patient  Discharge Placement                       Discharge Plan and Services                                     Social Determinants of Health (SDOH) Interventions     Readmission Risk Interventions No flowsheet data found.

## 2021-03-05 NOTE — Discharge Instructions (Signed)
Dear Ms. Blase,  You were hospitalized for osteomyelitis in your left leg and lumbar spine. You will continue to receive your IV antibiotics (ancef) through 04/14/21. You have a follow up appt with the infectious disease doctors on 12/21 regarding this.   Please also call the internal medicine center at [phone] when you finish rehab, so you can schedule a hospital follow up with one of the doctors and so you can also establish care with [place]. We can help to optimize your diabetes medications, and help take care of your other chronic issues.   I am glad you are feeling better and wish you the best of luck in rehab!  If you have any questions or concerns, call our clinic at [phone] or after hours call [phone] and ask for the internal medicine resident on call.

## 2021-03-05 NOTE — Progress Notes (Signed)
Inpatient Rehabilitation Admission Medication Review by a Pharmacist  A complete drug regimen review was completed for this patient to identify any potential clinically significant medication issues.  High Risk Drug Classes Is patient taking? Indication by Medication  Antipsychotic No   Anticoagulant Yes Lovenox 60mg  [place] q24h for VTE prophylaxis  Antibiotic Yes, as an intravenous medication Cefazolin - discitis through 04/14/2021 (8 weeks total therapy)  Opioid Yes Oxycontin + oxycodone PRN - pain  Antiplatelet No   Hypoglycemics/insulin Yes Levemir, Novolog, metformin, Tradjenta - T2DM  Vasoactive Medication Yes Losartan - HTN  Chemotherapy No   Other Yes Celexa - MDD/Anxiety; Ambien - insomnia; Pravachol - Dyslipidemia Naproxen, Voltaren gel, Robaxin - pain Ensure + Prosource, vitamin B12, ascorbic acid - nutritional supplementation Protonix - GERD Colace + Miralax - constipation Ambien - sleep     Type of Medication Issue Identified Description of Issue Recommendation(s)  Drug Interaction(s) (clinically significant)     Duplicate Therapy     Allergy     No Medication Administration End Date  Cefazolin planned end date missing Entered cefazolin end date per previous plans on discharge (04/14/21 per ID)  Incorrect Dose     Additional Drug Therapy Needed     Significant med changes from prior encounter (inform family/care partners about these prior to discharge).    Other  PTA meds- Januvia Changed to Tradjenta (not covered on her insurance) upon admission (02/17/2021) per interchange policy. Please change to Januvia upon discharge from CIR    Clinically significant medication issues were identified that warrant physician communication and completion of prescribed/recommended actions by midnight of the next day:  No  Name of provider notified for urgent issues identified:   Provider Method of Notification:     Pharmacist comments: Please change to Januvia upon discharge from  CIR (see above)  Time spent performing this drug regimen review (minutes):  30   13/12/2020, PharmD, BCPS Please check AMION for all [facility] contact numbers Clinical Pharmacist 03/05/2021 8:10 PM

## 2021-03-06 LAB — CBC
HCT: 32.3 % — ABNORMAL LOW (ref 36.0–46.0)
Hemoglobin: 10 g/dL — ABNORMAL LOW (ref 12.0–15.0)
MCH: 27.3 pg (ref 26.0–34.0)
MCHC: 31 g/dL (ref 30.0–36.0)
MCV: 88.3 fL (ref 80.0–100.0)
Platelets: 459 10*3/uL — ABNORMAL HIGH (ref 150–400)
RBC: 3.66 MIL/uL — ABNORMAL LOW (ref 3.87–5.11)
RDW: 12.6 % (ref 11.5–15.5)
WBC: 8 10*3/uL (ref 4.0–10.5)
nRBC: 0 % (ref 0.0–0.2)

## 2021-03-06 LAB — GLUCOSE, CAPILLARY
Glucose-Capillary: 112 mg/dL — ABNORMAL HIGH (ref 70–99)
Glucose-Capillary: 112 mg/dL — ABNORMAL HIGH (ref 70–99)
Glucose-Capillary: 117 mg/dL — ABNORMAL HIGH (ref 70–99)
Glucose-Capillary: 156 mg/dL — ABNORMAL HIGH (ref 70–99)

## 2021-03-06 LAB — CREATININE, SERUM
Creatinine, Ser: 0.44 mg/dL (ref 0.44–1.00)
GFR, Estimated: >60 mL/min (ref >60)

## 2021-03-06 MED ORDER — CHLORHEXIDINE GLUCONATE CLOTH 2 % EX PADS
6.0000 | MEDICATED_PAD | Freq: Every day | CUTANEOUS | Status: DC
  Administered 2021-03-06 – 2021-03-22 (×18): 6 via TOPICAL

## 2021-03-06 NOTE — Evaluation (Signed)
Occupational Therapy Assessment and Plan  Patient Details  Name: Lori Schmidt MRN: [id number] Date of Birth: 1975/08/29  OT Diagnosis: abnormal posture, muscle weakness (generalized), and [name] Rehab Potential:   ELOS: 10-14 days   Today's Date: 03/06/2021 OT Individual Time: 1045-1200 OT Individual Time Calculation (min): 75 min     Hospital Problem: Principal Problem:   S/P BKA (below knee amputation) unilateral, left ([place]) Active Problems:   Discitis of lumbar region   Left below-knee amputee [facility])   Past Medical History:  Past Medical History:  Diagnosis Date   Hyperlipidemia    Hypertension    Obesity    Type 2 diabetes mellitus ([place])    Past Surgical History:  Past Surgical History:  Procedure Laterality Date   AMPUTATION Left 02/19/2021   Procedure: AMPUTATION BELOW KNEE;  Surgeon: Newt Minion, MD;  Location: [place];  Service: Orthopedics;  Laterality: Left;   BREAST CYST EXCISION Right 05/2018   four cysts removed on right breast/axilla area   TEE WITHOUT CARDIOVERSION N/A 02/23/2021   Procedure: TRANSESOPHAGEAL ECHOCARDIOGRAM (TEE);  Surgeon: Pixie Casino, MD;  Location: [facility] ENDOSCOPY;  Service: Cardiovascular;  Laterality: N/A;    Assessment & Plan Clinical Impression: Lori Schmidt is a 45 year old right-handed female with history of hypertension, hyperlipidemia, obesity as well as diabetes mellitus, quit smoking 10 years ago.  Per chart review patient lives with her 38 year old daughter.  1 level home 5 steps to entry.  She works at [employer].  Independent prior to admission.  She does have a mother in the area.  Presented 02/17/2021 with increasing left lower extremity swelling and decreased functional gait as well as spiked a low-grade fever, nausea and vomiting and general malaise.  She denied any injury.  Patient had initially been seen at [facility] 02/11/2021 for left lower extremity swelling plan was for a venous Doppler however patient left  AMA.  In the ED 02/16/2021 venous Dopplers completed of left lower extremity showing no DVT and she received antibiotic therapy and was discharged home with blood cultures pending.  The following day patient was contacted blood cultures positive MSSA and she returned to the ED.  In the ED patient tachycardic 118 potassium 2.7 glucose 335 sodium 128, WBC 8600, hemoglobin 10.8, lactic acid 1.2, urine drug screen negative.  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  Numerous peripherally enhancing joint effusions throughout the left midfoot.  Extensive tenosynovial fluid collections throughout the flexor and extensor tendons of the left foot compatible with infectious tenosynovitis.  Orthopedic service Dr. Sharol Given consulted patient underwent left transtibial amputation application of wound VAC 02/19/2021.  Patient was maintained on antibiotic therapy with Ancef per infectious disease through 04/14/2021.  TEE completed showing no evidence of endocarditis.  Patient with persistent low back pain with MRI completed 02/20/2021 showing edema L4-L5 disc with adjacent inferior L4 and superior L5 endplate edema and enhancement concerning for discitis/osteomyelitis and again repeated MRI 02/28/2021 redemonstrating marrow edema L4-5 degree of signal change slightly progressed compared to previous MRI.  Scans were reviewed by infectious disease recommendations were to continue intravenous antibiotics as directed.  Subcutaneous Lovenox for DVT prophylaxis.  Acute blood loss anemia 9.7 and monitored.  Therapy evaluations completed due to patient decreased functional mobility was admitted for a comprehensive rehab program. Patient transferred to CIR on 03/05/2021 .    Patient currently requires mod with basic self-care skills secondary to muscle weakness,  decreased cardiorespiratoy endurance, decreased coordination and decreased motor planning,  decreased motor planning, and decreased sitting balance, decreased standing balance, decreased postural control, and decreased balance strategies.  Prior to hospitalization, patient could complete BADL and IADL with independent .  Patient will benefit from skilled intervention to decrease level of assist with basic self-care skills, increase independence with basic self-care skills, and increase level of independence with iADL prior to discharge home with care partner.  Anticipate patient will require intermittent supervision and follow up home health.  OT - End of Session Activity Tolerance: Tolerates 30+ min activity with multiple rests Endurance Deficit: Yes OT Assessment OT Patient demonstrates impairments in the following area(s): Balance;Endurance;Motor;Safety;Sensory;Skin Integrity OT Basic ADL's Functional Problem(s): Grooming;Bathing;Dressing;Toileting OT Advanced ADL's Functional Problem(s): Simple Meal Preparation OT Transfers Functional Problem(s): Toilet;Tub/Shower OT Plan OT Intensity: Minimum of 1-2 x/day, 45 to 90 minutes OT Frequency: 5 out of 7 days OT Duration/Estimated Length of Stay: 10-14 days OT Treatment/Interventions: Balance/vestibular training;Discharge planning;Pain management;Self Care/advanced ADL retraining;Therapeutic Activities;UE/LE Coordination activities;Visual/perceptual remediation/compensation;Therapeutic Exercise;Skin care/wound managment;Patient/family education;Functional mobility training;Disease mangement/prevention;Cognitive remediation/compensation;Community reintegration;DME/adaptive equipment instruction;Neuromuscular re-education;Psychosocial support;Splinting/orthotics;UE/LE Strength taining/ROM;Wheelchair propulsion/positioning OT Self Feeding Anticipated Outcome(s): no goal OT Basic Self-Care Anticipated Outcome(s): Supervision OT Toileting Anticipated Outcome(s): Mod I OT Bathroom Transfers Anticipated Outcome(s): Supervision OT  Recommendation Recommendations for Other Services: Neuropsych consult Patient destination: Home Follow Up Recommendations: Home health OT Equipment Recommended: To be determined   OT Evaluation Precautions/Restrictions  Precautions Precautions: Fall Precaution Comments: [name], anxiety Required Braces or Orthoses: Other Brace Other Brace: limb protector Restrictions Weight Bearing Restrictions: Yes LLE Weight Bearing: Non weight bearing Pain Pain Assessment Pain Scale: 0-10 Pain Score: 2  (low back) Home Living/Prior Functioning Home Living Family/patient expects to be discharged to:: Private residence Living Arrangements: Parent Available Help at Discharge: Family, Available PRN/intermittently Type of Home: House Home Access: Stairs to enter, Ramped entrance (pt states going to have ramp installed at [employer]) Technical brewer of Steps: 2 Entrance Stairs-Rails: Right, Left, Can reach both Home Layout: Two level, Able to live on main level with bedroom/bathroom (mother's house, where pt said she will be [place] to) [employer] Shower/Tub: Multimedia programmer: Associate Professor Accessibility: Yes  Lives With: Family IADL History Homemaking Responsibilities: Yes Occupation: Full time employment Prior Function Level of Independence: Independent with basic ADLs, Independent with gait, Independent with homemaking with ambulation, Independent with transfers  Able to Take Stairs?: Reciprically Driving: Yes Vocation: Full time employment Vocation Requirements: Programme researcher, broadcasting/film/video at [employer] Baseline Vision/History: 1 Wears glasses Ability to See in Adequate Light: 1 Impaired Patient Visual Report: Blurring of vision Vision Assessment?: Vision impaired- to be further tested in functional context Perception  Perception: Within Functional Limits Praxis Praxis: Intact Cognition Overall Cognitive Status: Within Functional Limits for tasks  assessed Arousal/Alertness: Awake/alert Orientation Level: Person;Place;Situation Person: Oriented Place: Oriented Situation: Oriented Year: 2022 Month: November Day of Week: Correct Memory: Appears intact Immediate Memory Recall: Sock;Blue;Bed Memory Recall Sock: Without Cue Memory Recall Blue: Without Cue Memory Recall Bed: Without Cue Awareness: Appears intact Problem Solving: Appears intact Safety/Judgment: Appears intact Sensation Sensation Light Touch: Impaired Detail Peripheral sensation comments: peripheral neuropathy on the RLE. Light Touch Impaired Details: Impaired RLE;Impaired LLE Proprioception: Appears Intact Coordination Gross Motor Movements are Fluid and Coordinated: No Fine Motor Movements are Fluid and Coordinated: Yes Coordination and Movement Description: grossly impaired 2/2 BKA Finger Nose Finger Test: [facility] Heel Shin Test: unable to perform on the LLE Motor  Motor Motor: Within Functional Limits Motor -  Skilled Clinical Observations: generalized weakness  Trunk/Postural Assessment  Cervical Assessment Cervical Assessment: Within Functional Limits Thoracic Assessment Thoracic Assessment: Within Functional Limits Lumbar Assessment Lumbar Assessment: Exceptions to [place] Postural Control Postural Control: Within Functional Limits  Balance Balance Balance Assessed: Yes Static Sitting Balance Static Sitting - Balance Support: No upper extremity supported Static Sitting - Level of Assistance: 6: Modified independent (Device/Increase time) Dynamic Sitting Balance Dynamic Sitting - Balance Support: During functional activity;No upper extremity supported Dynamic Sitting - Level of Assistance: 5: Stand by assistance Dynamic Sitting - Balance Activities: Lateral lean/weight shifting;Forward lean/weight shifting;Reaching for Consulting civil engineer Standing - Balance Support: Bilateral upper extremity supported Static Standing - Level of  Assistance: 4: Min assist Dynamic Standing Balance Dynamic Standing - Balance Support: Bilateral upper extremity supported;During functional activity Dynamic Standing - Level of Assistance: 3: Mod assist Dynamic Standing - Balance Activities: Lateral lean/weight shifting;Forward lean/weight shifting;Reaching for objects Extremity/Trunk Assessment RUE Assessment RUE Assessment: Within Functional Limits LUE Assessment LUE Assessment: Exceptions to [facility] Active Range of Motion (AROM) Comments: limited shoulder flexion approx 90*  Care Tool Care Tool Self Care Eating    Independent    Oral Care    Oral Care Assist Level: Set up assist    Bathing   Body parts bathed by patient: Right arm;Left arm;Chest;Abdomen;Front perineal area;Right upper leg;Left upper leg;Face;Right lower leg Body parts bathed by helper: Buttocks Body parts n/a: Left lower leg Assist Level: Minimal Assistance - Patient > 75%    Upper Body Dressing(including orthotics)   What is the patient wearing?: Pull over shirt   Assist Level: Set up assist    Lower Body Dressing (excluding footwear)   What is the patient wearing?: Pants Assist for lower body dressing: Maximal Assistance - Patient 25 - 49%    Putting on/Taking off footwear    Max A         Care Tool Toileting Toileting activity    Mod A     Care Tool Bed Mobility Roll left and right activity   Roll left and right assist level: Supervision/Verbal cueing    Sit to lying activity   Sit to lying assist level: Supervision/Verbal cueing    Lying to sitting on side of bed activity   Lying to sitting on side of bed assist level: the ability to move from lying on the back to sitting on the side of the bed with no back support.: Supervision/Verbal cueing     Care Tool Transfers Sit to stand transfer   Sit to stand assist level: Moderate Assistance - Patient 50 - 74%    Chair/bed transfer   Chair/bed transfer assist level: Moderate Assistance -  Patient 50 - 74%     Toilet transfer   Assist Level: Moderate Assistance - Patient 50 - 74%     Care Tool Cognition  Expression of Ideas and Wants Expression of Ideas and Wants: 4. Without difficulty (complex and basic) - expresses complex messages without difficulty and with speech that is clear and easy to understand  Understanding Verbal and Non-Verbal Content Understanding Verbal and Non-Verbal Content: 4. Understands (complex and basic) - clear comprehension without cues or repetitions   Memory/Recall Ability Memory/Recall Ability : Current season;Location of own room;Staff names and faces;That he or she is in a hospital/hospital unit   Refer to Care Plan for [place] 1 OT Short Term Goal 1 (Week 1): Pt will perform BSC/toilet transfer with CGA and LRAD  OT Short Term Goal 2 (Week 1): Pt will perform LB dress in most appropriate environment CGA OT Short Term Goal 3 (Week 1): Pt will perform sit <> stands at [place] consistently with CGA in prep for ADL OT Short Term Goal 4 (Week 1): Pt will don limb guard with Supervision  Recommendations for other services: Neuropsych   Skilled Therapeutic Intervention ADL ADL Eating: Not assessed Grooming: Setup Upper Body Bathing: Supervision/safety Lower Body Bathing: Minimal assistance Upper Body Dressing: Setup Lower Body Dressing: Maximal assistance Toileting: Not assessed Toilet Transfer: Moderate assistance Toilet Transfer Method: Stand pivot Mobility  Bed Mobility Bed Mobility: Sit to Supine Rolling Right: Supervision/verbal cueing Rolling Left: Supervision/Verbal cueing Supine to Sit: Supervision/Verbal cueing Sit to Supine: Supervision/Verbal cueing Transfers Sit to Stand: Minimal Assistance - Patient > 75% Stand to Sit: Minimal Assistance - Patient > 75%   Skilled Intervention: Pt greeted at time of session sitting up in wheelchair, agreeable to OT session and eval, no pain reported in  residual limb but did have some lower back pain 2/2 infection. Discussed with pt role and purpose of OT. Pt education regarding residual limb education, future prosthetic training, and limb care. Pt educated throughout session regarding wheelchair management and how to place/remove leg rests. Pt performed ADL tasks at sink level with Min/Mod A overall for LB bathing and dressing at wheelchair level. Sit <> stand at sink  with Min/Mod A throughout. Also assisted patient with hair washing at sink level and pt performing grooming and hair drying tasks. Stand pivot wheelchair > bed Min/Mod A with RW. Alarm on call bell in reach.    Discharge Criteria: Patient will be discharged from OT if patient refuses treatment 3 consecutive times without medical reason, if treatment goals not met, if there is a change in medical status, if patient makes no progress towards goals or if patient is discharged from hospital.  The above assessment, treatment plan, treatment alternatives and goals were discussed and mutually agreed upon: by patient  [name] 03/06/2021, 12:50 PM

## 2021-03-06 NOTE — Plan of Care (Signed)
  Problem: RH Balance Goal: LTG Patient will maintain dynamic standing with ADLs (OT) Description: LTG:  Patient will maintain dynamic standing balance with assist during activities of daily living (OT)  Flowsheets (Taken 03/06/2021 1300) LTG: Pt will maintain dynamic standing balance during ADLs with: Independent with assistive device   Problem: Sit to Stand Goal: LTG:  Patient will perform sit to stand in prep for activites of daily living with assistance level (OT) Description: LTG:  Patient will perform sit to stand in prep for activites of daily living with assistance level (OT) Flowsheets (Taken 03/06/2021 1300) LTG: PT will perform sit to stand in prep for activites of daily living with assistance level: Independent with assistive device   Problem: RH Grooming Goal: LTG Patient will perform grooming w/assist,cues/equip (OT) Description: LTG: Patient will perform grooming with assist, with/without cues using equipment (OT) Flowsheets (Taken 03/06/2021 1300) LTG: Pt will perform grooming with assistance level of: Independent with assistive device    Problem: RH Bathing Goal: LTG Patient will bathe all body parts with assist levels (OT) Description: LTG: Patient will bathe all body parts with assist levels (OT) Flowsheets (Taken 03/06/2021 1300) LTG: Pt will perform bathing with assistance level/cueing: Supervision/Verbal cueing   Problem: RH Dressing Goal: LTG Patient will perform upper body dressing (OT) Description: LTG Patient will perform upper body dressing with assist, with/without cues (OT). Flowsheets (Taken 03/06/2021 1300) LTG: Pt will perform upper body dressing with assistance level of: Set up assist Goal: LTG Patient will perform lower body dressing w/assist (OT) Description: LTG: Patient will perform lower body dressing with assist, with/without cues in positioning using equipment (OT) Flowsheets (Taken 03/06/2021 1300) LTG: Pt will perform lower body dressing with  assistance level of: Supervision/Verbal cueing   Problem: RH Toileting Goal: LTG Patient will perform toileting task (3/3 steps) with assistance level (OT) Description: LTG: Patient will perform toileting task (3/3 steps) with assistance level (OT)  Flowsheets (Taken 03/06/2021 1300) LTG: Pt will perform toileting task (3/3 steps) with assistance level: Independent with assistive device   Problem: RH Simple Meal Prep Goal: LTG Patient will perform simple meal prep w/assist (OT) Description: LTG: Patient will perform simple meal prep with assistance, with/without cues (OT). Flowsheets (Taken 03/06/2021 1300) LTG: Pt will perform simple meal prep with assistance level of: Independent with assistive device   Problem: RH Toilet Transfers Goal: LTG Patient will perform toilet transfers w/assist (OT) Description: LTG: Patient will perform toilet transfers with assist, with/without cues using equipment (OT) Flowsheets (Taken 03/06/2021 1300) LTG: Pt will perform toilet transfers with assistance level of: Independent with assistive device   Problem: RH Tub/Shower Transfers Goal: LTG Patient will perform tub/shower transfers w/assist (OT) Description: LTG: Patient will perform tub/shower transfers with assist, with/without cues using equipment (OT) Flowsheets (Taken 03/06/2021 1300) LTG: Pt will perform tub/shower stall transfers with assistance level of: Supervision/Verbal cueing

## 2021-03-06 NOTE — Plan of Care (Signed)
  Problem: RH Balance Goal: LTG Patient will maintain dynamic sitting balance (PT) Description: LTG:  Patient will maintain dynamic sitting balance with assistance during mobility activities (PT) Flowsheets (Taken 03/06/2021 1231) LTG: Pt will maintain dynamic sitting balance during mobility activities with:: Independent Goal: LTG Patient will maintain dynamic standing balance (PT) Description: LTG:  Patient will maintain dynamic standing balance with assistance during mobility activities (PT) Flowsheets (Taken 03/06/2021 1231) LTG: Pt will maintain dynamic standing balance during mobility activities with:: Supervision/Verbal cueing   Problem: RH Bed Mobility Goal: LTG Patient will perform bed mobility with assist (PT) Description: LTG: Patient will perform bed mobility with assistance, with/without cues (PT). Flowsheets (Taken 03/06/2021 1231) LTG: Pt will perform bed mobility with assistance level of: Independent with assistive device    Problem: RH Bed to Chair Transfers Goal: LTG Patient will perform bed/chair transfers w/assist (PT) Description: LTG: Patient will perform bed to chair transfers with assistance (PT). Flowsheets (Taken 03/06/2021 1231) LTG: Pt will perform Bed to Chair Transfers with assistance level: Independent with assistive device    Problem: RH Car Transfers Goal: LTG Patient will perform car transfers with assist (PT) Description: LTG: Patient will perform car transfers with assistance (PT). Flowsheets (Taken 03/06/2021 1231) LTG: Pt will perform car transfers with assist:: Contact Guard/Touching assist   Problem: RH Ambulation Goal: LTG Patient will ambulate in controlled environment (PT) Description: LTG: Patient will ambulate in a controlled environment, # of feet with assistance (PT). Flowsheets (Taken 03/06/2021 1231) LTG: Pt will ambulate in controlled environ  assist needed:: Supervision/Verbal cueing LTG: Ambulation distance in controlled environment:  26ft with LRAD Goal: LTG Patient will ambulate in home environment (PT) Description: LTG: Patient will ambulate in home environment, # of feet with assistance (PT). Flowsheets (Taken 03/06/2021 1231) LTG: Pt will ambulate in home environ  assist needed:: Supervision/Verbal cueing LTG: Ambulation distance in home environment: 68ft with LRAD   Problem: RH Wheelchair Mobility Goal: LTG Patient will propel w/c in controlled environment (PT) Description: LTG: Patient will propel wheelchair in controlled environment, # of feet with assist (PT) Flowsheets (Taken 03/06/2021 1231) LTG: Pt will propel w/c in controlled environ  assist needed:: Independent with assistive device LTG: Propel w/c distance in controlled environment: 114ft Goal: LTG Patient will propel w/c in home environment (PT) Description: LTG: Patient will propel wheelchair in home environment, # of feet with assistance (PT). Flowsheets (Taken 03/06/2021 1231) LTG: Pt will propel w/c in home environ  assist needed:: Independent with assistive device Distance: wheelchair distance in controlled environment: 50

## 2021-03-06 NOTE — Progress Notes (Signed)
Physical Therapy Session Note  Patient Details  Name: Lori Schmidt MRN: [id number] Date of Birth: 10/04/1975  Today's Date: 03/06/2021 PT Individual Time: 7035-0093 PT Individual Time Calculation (min): 43 min   Short Term Goals: Week 1:  PT Short Term Goal 1 (Week 1): Pt will transfer to [facility] with CGA and LRAD PT Short Term Goal 2 (Week 1): Pt will ambulate 75ft with mod assist and RW PT Short Term Goal 3 (Week 1): Pt will propel WC x 146ft without assist  Skilled Therapeutic Interventions/Progress Updates:  Pt received supine in bed with her family exiting upon therapist arrival and pt agreeable to therapy session. Pt already wearing L LE limb guard. Supine>sitting R EOB, HOB partially elevated and using bedrail, with supervision. Educated pt on correct set-up of wheelchair for lateral scoot transfers using transfer board - educated pt on proper positioning of transfer board. L lateral scoot EOB>w/c using transfer board with max assist for board placement and light min assist for balance and stabilizing w/c while scooting. Educated pt on how to don/doff w/c leg rests. Transported to/from gym in w/c for time management and energy conservation. Pt able to recall and correctly doff w/c leg rests with min assist then set-up w/c for lateral scoot transfer with min verbal cuing. Performed R lateral scoot w/c>EOM using transfer board, max assist for board placement then CGA for steadying while scooting - pt reports easier to transfer towards R compared to L. Sit<>stand EOM<>bari-RW 3x with light min assist to come to stand - pt able to recall education from earlier session on safe hand placement when using AD - pt able to tolerate static standing at least 30 seconds with CGA. L squat pivot EOM>w/c using transfer board, max assist for board management and CGA/light min assist for steadying while scooting. Transported back to room. R lateral scoot w/c>EOB using transfer board as above. Sitting EOB  doffed shirt and donned gown without assist. Sit>supine HOB partially elevated and using bedrail with supervision. Supine in bed doffed pants min assist for threading over limb guard - noticed some possible skin breakdown along back of thigh where the limb guard is rubbing, notified nurse. Pt left supine in bed with needs in reach and bed alarm on.  Therapy Documentation Precautions:  Precautions Precautions: Fall Precaution Comments: [name], anxiety Required Braces or Orthoses: Other Brace Other Brace: limb protector Restrictions Weight Bearing Restrictions: Yes LLE Weight Bearing: Non weight bearing   Pain: Reports some discomfort wearing L LE limb guard but is adamant about keeping it on due to fear of loosing knee extension ROM and risk of falling on the limb.     Therapy/Group: Individual Therapy  Ginny Forth , PT, DPT, NCS, CSRS  03/06/2021, 3:16 PM

## 2021-03-06 NOTE — Progress Notes (Addendum)
PMR Admission Coordinator Pre-Admission Assessment   Patient: Lori Schmidt is an 45 y.o., female MRN: [id number] DOB: 03/08/1976 Height: _0  (170.2 cm) Weight: 121.2 kg   Insurance Information HMO: yes     PPO:      PCP:      IPA:      80/20:      OTHER:  PRIMARY: [facility]      Policy#: [id number]      Subscriber: Pt.  CM Name: Luisa Hart      Phone#: [phone]     Fax#: [phone]  I received a fax from [name] at [facility] stating that Pt. Was authorized for admission 03/03/21 through 03/09/10 with updates due 25/49/82 Pre-Cert#: [id number]       Employer:  Benefits:  Phone #: [phone]     Name:  Irene Shipper Date: 04/11/2020 - 04/10/2021 Deductible: $1,200 ($1,200 met) OOP Max: Individual $6,000 ($1,989.90 met) CIR: 80% coverage, 20% co-insurance SNF: 80% coverage, 20% co-insurance Outpatient: $50 copay Home Health:  100% coverage; limited to 39 visits/cal yr (46 remaining) DME: 100% coverage, auth required Providers: in network  SECONDARY: none       Policy#:      Phone#:    Development worker, community:       Phone#:    The Engineer, petroleum" for patients in Inpatient Rehabilitation Facilities with attached "Privacy Act [place] Records" was provided and verbally reviewed with: Patient   Emergency Contact Information Contact Information       Name Relation Home Work Mobile    [name],[name] Mother     [phone]           Current Medical History  Patient Admitting Diagnosis: [name] History of Present Illness: Lori Schmidt is a 45 year old right-handed female with history of hypertension, hyperlipidemia, obesity as well as diabetes mellitus, quit smoking 10 years ago.  Per chart review patient lives with her 77 year old daughter.  1 level home 5 steps to entry.  She works at [employer].  Independent prior to admission.  She does have a mother in the area.  Presented 02/17/2021 with increasing left lower extremity swelling and  decreased functional gait as well as spiked a low-grade fever, nausea and vomiting and general malaise.  She denied any injury.  Patient had initially been seen at [facility] 02/11/2021 for left lower extremity swelling plan was for a venous Doppler however patient left AMA.  In the ED 02/16/2021 venous Dopplers completed of left lower extremity showing no DVT and she received antibiotic therapy and was discharged home with blood cultures pending.  The following day patient was contacted blood cultures positive MSSA and she returned to the ED.  In the ED patient tachycardic 118 potassium 2.7 glucose 335 sodium 128, WBC 8600, hemoglobin 10.8, lactic acid 1.2, urine drug screen negative.  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  Numerous peripherally enhancing joint effusions throughout the left midfoot.  Extensive tenosynovial fluid collections throughout the flexor and extensor tendons of the left foot compatible with infectious tenosynovitis.  Orthopedic service Dr. Sharol Given consulted patient underwent left transtibial amputation application of wound VAC 02/19/2021.  Patient was maintained on antibiotic therapy with Ancef per infectious disease through 04/14/2021.  TEE completed showing no evidence of endocarditis.  Patient with persistent low back pain with MRI completed 02/20/2021 showing edema L4-L5 disc with adjacent inferior L4 and superior L5 endplate edema and enhancement concerning for  discitis/osteomyelitis and again repeated MRI 02/28/2021 redemonstrating marrow edema L4-5 degree of signal change slightly progressed compared to previous MRI.  Scans were reviewed by infectious disease recommendations were to continue intravenous antibiotics as directed.  Subcutaneous Lovenox for DVT prophylaxis.  Acute blood loss anemia 9.7 and monitored.  Therapy evaluations completed due to patient decreased functional mobility was admitted  for a comprehensive rehab program.   Patient's medical record from [facility] has been reviewed by the rehabilitation admission coordinator and physician.   Past Medical History      Past Medical History:  Diagnosis Date   Hyperlipidemia     Hypertension     Obesity     Type 2 diabetes mellitus ([place])        Has the patient had major surgery during 100 days prior to admission? yes   Family History   family history includes Breast cancer in her paternal grandmother; Diabetes in her maternal grandmother and mother; Hypothyroidism in her mother.   Current Medications   Current Facility-Administered Medications:    (feeding supplement) PROSource Plus liquid 30 mL, 30 mL, Oral, BID BM, Velna Ochs, MD, 30 mL at 03/05/21 0905   0.9 %  sodium chloride infusion, , Intravenous, Continuous, Gaylan Gerold, DO, Stopped at 02/28/21 2203   acetaminophen (TYLENOL) tablet 325-650 mg, 325-650 mg, Oral, Q6H PRN, Newt Minion, MD, 650 mg at 03/02/21 0514   alum & mag hydroxide-simeth (MAALOX/MYLANTA) 200-200-20 MG/5ML suspension 15-30 mL, 15-30 mL, Oral, Q2H PRN, Newt Minion, MD   ascorbic acid (VITAMIN C) tablet 1,000 mg, 1,000 mg, Oral, Daily, Newt Minion, MD, 1,000 mg at 03/05/21 1610   bisacodyl (DULCOLAX) EC tablet 5 mg, 5 mg, Oral, Daily PRN, Newt Minion, MD, 5 mg at 02/24/21 1516   ceFAZolin (ANCEF) IVPB 2g/100 mL premix, 2 g, Intravenous, Q8H, Newt Minion, MD, Last Rate: 200 mL/hr at 03/05/21 0525, 2 g at 03/05/21 0525   Chlorhexidine Gluconate Cloth 2 % PADS 6 each, 6 each, Topical, Daily, Velna Ochs, MD, 6 each at 03/04/21 2224   citalopram (CELEXA) tablet 40 mg, 40 mg, Oral, Daily, Newt Minion, MD, 40 mg at 03/05/21 9604   docusate sodium (COLACE) capsule 100 mg, 100 mg, Oral, Daily, Newt Minion, MD, 100 mg at 03/03/21 1000   enoxaparin (LOVENOX) injection 60 mg, 60 mg, Subcutaneous, Q24H, Newt Minion, MD, 60 mg at 03/05/21 0905   feeding  supplement (ENSURE ENLIVE / ENSURE PLUS) liquid 237 mL, 237 mL, Oral, BID, Atway, Rayann N, DO, 237 mL at 03/05/21 0910   guaiFENesin-dextromethorphan (ROBITUSSIN DM) 100-10 MG/5ML syrup 15 mL, 15 mL, Oral, Q4H PRN, Newt Minion, MD, 15 mL at 02/22/21 1154   hydrALAZINE (APRESOLINE) injection 5 mg, 5 mg, Intravenous, Q20 Min PRN, Newt Minion, MD   insulin aspart (novoLOG) injection 0-20 Units, 0-20 Units, Subcutaneous, TID WC, Newt Minion, MD, 3 Units at 03/05/21 0850   insulin aspart (novoLOG) injection 0-5 Units, 0-5 Units, Subcutaneous, QHS, Newt Minion, MD, 2 Units at 02/25/21 2139   insulin aspart (novoLOG) injection 8 Units, 8 Units, Subcutaneous, TID WC, Atway, Rayann N, DO, 8 Units at 03/04/21 1759   insulin detemir (LEVEMIR) injection 55 Units, 55 Units, Subcutaneous, QHS, Atway, Rayann N, DO, 55 Units at 03/04/21 2222   linagliptin (TRADJENTA) tablet 5 mg, 5 mg, Oral, Daily, Atway, Rayann N, DO, 5 mg at 03/05/21 0907   metFORMIN (GLUCOPHAGE) tablet 1,000 mg, 1,000 mg,  Oral, BID WC, Atway, Rayann N, DO, 1,000 mg at 03/05/21 0851   methocarbamol (ROBAXIN) tablet 750 mg, 750 mg, Oral, Q6H, Gaylan Gerold, DO, 750 mg at 03/05/21 1148   naproxen (NAPROSYN) tablet 500 mg, 500 mg, Oral, BID WC, Corky Sox, MD, 500 mg at 03/05/21 0851   ondansetron [facility]) injection 4 mg, 4 mg, Intravenous, Q6H PRN, Newt Minion, MD, 4 mg at 03/03/21 0250   oxyCODONE (Oxy IR/ROXICODONE) immediate release tablet 5 mg, 5 mg, Oral, Q8H PRN, Atway, Rayann N, DO   oxyCODONE (OXYCONTIN) 12 hr tablet 10 mg, 10 mg, Oral, Q12H, Atway, Rayann N, DO, 10 mg at 03/05/21 0907   pantoprazole (PROTONIX) EC tablet 40 mg, 40 mg, Oral, Daily, Newt Minion, MD, 40 mg at 03/05/21 0906   phenol (CHLORASEPTIC) mouth spray 1 spray, 1 spray, Mouth/Throat, PRN, Newt Minion, MD   polyethylene glycol (MIRALAX / GLYCOLAX) packet 17 g, 17 g, Oral, Daily, Gaylan Gerold, DO, 17 g at 03/05/21 0904   pravastatin (PRAVACHOL)  tablet 40 mg, 40 mg, Oral, Daily, Atway, Rayann N, DO, 40 mg at 03/05/21 0906   sodium chloride flush (NS) 0.9 % injection 10-40 mL, 10-40 mL, Intracatheter, Q12H, Guilloud, Hoyle Sauer, MD, 10 mL at 03/05/21 0909   sodium chloride flush (NS) 0.9 % injection 10-40 mL, 10-40 mL, Intracatheter, PRN, Velna Ochs, MD, 10 mL at 03/03/21 0424   vitamin B-12 (CYANOCOBALAMIN) tablet 500 mcg, 500 mcg, Oral, QODAY, Newt Minion, MD, 500 mcg at 03/04/21 1047   zolpidem (AMBIEN) tablet 5 mg, 5 mg, Oral, QHS, Atway, Rayann N, DO, 5 mg at 03/04/21 2223   Patients Current Diet:  Diet Order                  Diet Carb Modified Fluid consistency: Thin; Room service appropriate? Yes  Diet effective now                         Precautions / Restrictions Precautions Precautions: Fall Precaution Comments: mod fall Other Brace: limb protector Restrictions Weight Bearing Restrictions: Yes LLE Weight Bearing: Non weight bearing    Has the patient had 2 or more falls or a fall with injury in the past year? No   Prior Activity Level Community (5-7x/wk): Pt. was active in the community PTA   Prior Functional Level Self Care: Did the patient need help bathing, dressing, using the toilet or eating? Independent   Indoor Mobility: Did the patient need assistance with walking from room to room (with or without device)? Independent   Stairs: Did the patient need assistance with internal or external stairs (with or without device)? Independent   Functional Cognition: Did the patient need help planning regular tasks such as shopping or remembering to take medications? Independent   Patient Information Are you of Hispanic, Latino/a,or Spanish origin?: A. No, not of Hispanic, Latino/a, or Spanish origin What is your race?: A. White Do you need or want an interpreter to communicate with a doctor or health care staff?: 0. No   Patient's Response To:  Health Literacy and Transportation Is the patient  able to respond to health literacy and transportation needs?: Yes Health Literacy - How often do you need to have someone help you when you read instructions, pamphlets, or other written material from your doctor or pharmacy?: Never In the past 12 months, has lack of transportation kept you from medical appointments or from getting medications?: No In the past  12 months, has lack of transportation kept you from meetings, work, or from getting things needed for daily living?: No   [place] / [place] Devices/Equipment: Environmental consultant (specify type) (FWW) Home Equipment: [place] (2 wheels), Tub bench   Prior Device Use: Indicate devices/aids used by the patient prior to current illness, exacerbation or injury? None of the above   Current Functional Level Cognition   Overall Cognitive Status: Within Functional Limits for tasks assessed Difficult to assess due to: Level of arousal Orientation Level: Oriented X4 Following Commands: Follows one step commands consistently Safety/Judgement: Decreased awareness of safety, Decreased awareness of deficits General Comments: patient more calm with transfrs and exhibits less anxiety    Extremity Assessment (includes Sensation/Coordination)   Upper Extremity Assessment: LUE deficits/detail LUE Deficits / Details: sensitive to touch, however able to push and pull with shoulder without indication of pain LUE Sensation:  (extra sensitive) LUE Coordination: WNL  Lower Extremity Assessment: Defer to PT evaluation LLE Deficits / Details: Limited movement throughout, reports sensation intact. LLE: Unable to fully assess due to pain LLE Sensation: WNL LLE Coordination: decreased fine motor, decreased gross motor     ADLs   Overall ADL's : Needs assistance/impaired Eating/Feeding: Set up, Bed level (HOb elevated) Grooming: Wash/dry face, Brushing hair, Set up, Sitting Grooming Details (indicate cue type and reason): lateral  lean to right, able to off weight her R arm to use for grooming tasks Upper Body Bathing: Minimal assistance Upper Body Bathing Details (indicate cue type and reason): for back sitting EOB Lower Body Bathing: Maximal assistance, Bed level Upper Body Dressing : Moderate assistance Lower Body Dressing: Maximal assistance, Bed level Lower Body Dressing Details (indicate cue type and reason): reposition limb guard Toilet Transfer: Maximal assistance, +2 for safety/equipment, +2 for physical assistance, Requires drop arm, Requires wide/bariatric Toilet Transfer Details (indicate cue type and reason): failed attempt at lateral transfer today - she will need to continue to use bed pain at this time Toileting- Clothing Manipulation and Hygiene: Maximal assistance, +2 for safety/equipment, Bed level Toileting - Clothing Manipulation Details (indicate cue type and reason): rolling for peri care Functional mobility during ADLs: Maximal assistance, Total assistance, +2 for physical assistance, +2 for safety/equipment (attempted lateral transfer) General ADL Comments: Pt much better cognitively, but get internally distracted by pain which impacts her ability to perform transfers essential to ADL- Pt DOES put forth good effort and is motivated     Mobility   Overal bed mobility: Needs Assistance Bed Mobility: Rolling, Sidelying to Sit Rolling: Min guard Sidelying to sit: Min assist Sit to supine: +2 for physical assistance, HOB elevated, Total assist Sit to sidelying: +2 for safety/equipment, +2 for physical assistance, Mod assist General bed mobility comments: Pt able to come to EOB with directional cues and use of rail.     Transfers   Overall transfer level: Needs assistance Equipment used: Rolling walker (2 wheels) Transfers: Sit to/from Stand Sit to Stand: Mod assist, +2 physical assistance Bed to/from chair/wheelchair/BSC transfer type:: Stand pivot Stand pivot transfers: Mod assist, +2  safety/equipment  Lateral/Scoot Transfers: Mod assist, +2 physical assistance, +2 safety/equipment, From elevated surface Transfer via Lift Equipment: Stedy General transfer comment: patient stood from recliner with mod assist +2 with on second attempt and performed stand pivot transfer to EOB     Ambulation / Gait / Stairs / Wheelchair Mobility   Ambulation/Gait General Gait Details: unable to stand     Posture / Balance Dynamic Sitting Balance  Sitting balance - Comments: patient asked to remain sitting on EOB with setup for grooming Balance Overall balance assessment: Needs assistance Sitting-balance support: Single extremity supported, Bilateral upper extremity supported Sitting balance-Leahy Scale: Fair Sitting balance - Comments: patient asked to remain sitting on EOB with setup for grooming Postural control: Posterior lean Standing balance support: Bilateral upper extremity supported Standing balance-Leahy Scale: Poor Standing balance comment: stood with RW for transfer to EOB     Special needs/care consideration Continuous Drip IV  abx and Skin surgical incision, excoiration on BUEs, Weeping on L distal foot    Previous Home Environment (from acute therapy documentation) Living Arrangements: Children (72 y/o daughter)  Lives With: Spouse Available Help at Discharge: Family, Available PRN/intermittently Type of Home: House Home Layout: One level Home Access: Stairs to enter Entrance Stairs-Rails: Right, Left, Can reach both Entrance Stairs-Number of Steps: 5-6 Bathroom Shower/Tub: Multimedia programmer: Standard Bathroom Accessibility: Yes How Accessible: Accessible via walker [place]: No   Discharge Living Setting Plans for Discharge Living Setting: Patient's home Type of Home at Discharge: House Discharge Home Layout: One level Discharge Home Access: Stairs to enter Entrance Stairs-Rails: Right, Left, Can reach both Entrance Stairs-Number of  Steps: 5-6 Discharge Bathroom Shower/Tub: Walk-in shower Discharge Bathroom Toilet: Standard Discharge Bathroom Accessibility: Yes How Accessible: Accessible via walker Does the patient have any problems obtaining your medications?: No   Social/Family/Support Systems Patient Roles: Parent Contact Information: [phone] Anticipated Caregiver: Iven Finn Anticipated Caregiver's Contact Information: [phone] Ability/Limitations of Caregiver: min A Caregiver Availability: 24/7 Discharge Plan Discussed with Primary Caregiver: Yes Is Caregiver In Agreement with Plan?: Yes Does Caregiver/Family have Issues with Lodging/Transportation while Pt is in Rehab?: No   Goals Patient/Family Goal for Rehab: PT/OT Supervision Expected length of stay: 10-12 days Pt/Family Agrees to Admission and willing to participate: Yes Program Orientation Provided & Reviewed with Pt/Caregiver Including Roles  & Responsibilities: Yes   Decrease burden of Care through IP rehab admission: Specialzed equipment needs, Decrease number of caregivers, Bowel and bladder program, and Patient/family education   Possible need for SNF placement upon discharge: not anticipated    Patient Condition: I have reviewed medical records from [facility], spoken with CM, and patient and family member. I met with patient at the bedside for inpatient rehabilitation assessment.  Patient will benefit from ongoing PT and OT, can actively participate in 3 hours of therapy a day 5 days of the week, and can make measurable gains during the admission.  Patient will also benefit from the coordinated team approach during an Inpatient Acute Rehabilitation admission.  The patient will receive intensive therapy as well as Rehabilitation physician, nursing, social worker, and care management interventions.  Due to safety, skin/wound care, disease management, medication administration, pain management, and patient education the  patient requires 24 hour a day rehabilitation nursing.  The patient is currently min A+2  with mobility and basic ADLs.  Discharge setting and therapy post discharge at home with home health is anticipated.  Patient has agreed to participate in the Acute Inpatient Rehabilitation Program and will admit today.   Preadmission Screen Completed By:  Genella Mech, 03/05/2021 11:57 AM ______________________________________________________________________   Discussed status with Dr. Dagoberto Ligas  on 03/05/21 at 87 and received approval for admission today.   Admission Coordinator:  Danise Edge, time 0623 /Date 03/05/21    Assessment/Plan: Diagnosis: Does the need for close, 24 hr/day Medical supervision in concert with the patient's rehab needs make  it unreasonable for this patient to be served in a less intensive setting? 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 Yes Does the patient require coordinated care of a physician, rehab nurse, PT, OT, and SLP to address physical and functional deficits in the context of the above medical diagnosis(es)? Yes Addressing deficits in the following areas: balance, endurance, locomotion, strength, transferring, bowel/bladder control, bathing, dressing, feeding, grooming, and toileting Can the patient actively participate in an intensive therapy program of at least 3 hrs of therapy 5 days a week? Yes The potential for patient to make measurable gains while on inpatient rehab is good Anticipated functional outcomes upon discharge from inpatient rehab: supervision PT, supervision OT, n/a SLP Estimated rehab length of stay to reach the above functional goals is: 10-12  days Anticipated discharge destination: Home 10. Overall Rehab/Functional Prognosis: good     MD Signature:

## 2021-03-06 NOTE — Evaluation (Signed)
Physical Therapy Assessment and Plan  Patient Details  Name: Lori Schmidt MRN: [id number] Date of Birth: 1975-11-12  PT Diagnosis: Difficulty walking, Low back pain, and Muscle weakness Rehab Potential: Good ELOS: 12-16 days   Today's Date: 03/06/2021 PT Individual Time: 0920-1018 PT Individual Time Calculation (min): 28 min    Hospital Problem: Principal Problem:   S/P BKA (below knee amputation) unilateral, left ([place]) Active Problems:   Discitis of lumbar region   Left below-knee amputee [facility])   Past Medical History:  Past Medical History:  Diagnosis Date   Hyperlipidemia    Hypertension    Obesity    Type 2 diabetes mellitus ([place])    Past Surgical History:  Past Surgical History:  Procedure Laterality Date   AMPUTATION Left 02/19/2021   Procedure: AMPUTATION BELOW KNEE;  Surgeon: Newt Minion, MD;  Location: [place];  Service: Orthopedics;  Laterality: Left;   BREAST CYST EXCISION Right 05/2018   four cysts removed on right breast/axilla area   TEE WITHOUT CARDIOVERSION N/A 02/23/2021   Procedure: TRANSESOPHAGEAL ECHOCARDIOGRAM (TEE);  Surgeon: Pixie Casino, MD;  Location: [facility] ENDOSCOPY;  Service: Cardiovascular;  Laterality: N/A;    Assessment & Plan Clinical Impression: Patient is a 45 year old right-handed female with history of hypertension, hyperlipidemia, obesity as well as diabetes mellitus, quit smoking 10 years ago.  Per chart review patient lives with her 32 year old daughter.  1 level home 5 steps to entry.  She works at [employer].  Independent prior to admission.  She does have a mother in the area.  Presented 02/17/2021 with increasing left lower extremity swelling and decreased functional gait as well as spiked a low-grade fever, nausea and vomiting and general malaise.  She denied any injury.  Patient had initially been seen at [facility] 02/11/2021 for left lower extremity swelling plan was for a venous Doppler however patient left AMA.  In the ED  02/16/2021 venous Dopplers completed of left lower extremity showing no DVT and she received antibiotic therapy and was discharged home with blood cultures pending.  The following day patient was contacted blood cultures positive MSSA and she returned to the ED.  In the ED patient tachycardic 118 potassium 2.7 glucose 335 sodium 128, WBC 8600, hemoglobin 10.8, lactic acid 1.2, urine drug screen negative.  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  Numerous peripherally enhancing joint effusions throughout the left midfoot.  Extensive tenosynovial fluid collections throughout the flexor and extensor tendons of the left foot compatible with infectious tenosynovitis.  Orthopedic service Dr. Sharol Given consulted patient underwent left transtibial amputation application of wound VAC 02/19/2021.  Patient was maintained on antibiotic therapy with Ancef per infectious disease through 04/14/2021.  TEE completed showing no evidence of endocarditis.  Patient with persistent low back pain with MRI completed 02/20/2021 showing edema L4-L5 disc with adjacent inferior L4 and superior L5 endplate edema and enhancement concerning for discitis/osteomyelitis and again repeated MRI 02/28/2021 redemonstrating marrow edema L4-5 degree of signal change slightly progressed compared to previous MRI.  Scans were reviewed by infectious disease recommendations were to continue intravenous antibiotics as directed.  Subcutaneous Lovenox for DVT prophylaxis.  Acute blood loss anemia 9.7 and monitored.  Patient transferred to CIR on 03/05/2021 .   Patient currently requires mod with mobility secondary to muscle weakness and muscle joint tightness, decreased cardiorespiratoy endurance, and decreased standing balance, decreased balance strategies, and difficulty maintaining precautions.  Prior to hospitalization, patient  was independent  with mobility and lived with   Family in a House home.  Home access is 2Stairs to enter, Ramped entrance.  Patient will benefit from skilled PT intervention to maximize safe functional mobility, minimize fall risk, and decrease caregiver burden for planned discharge home with intermittent assist.  Anticipate patient will benefit from follow up [facility] at discharge.  PT - End of Session Activity Tolerance: Tolerates 10 - 20 min activity with multiple rests Endurance Deficit: Yes PT Assessment Rehab Potential (ACUTE/IP ONLY): Good PT Barriers to Discharge: [place] home environment;Decreased caregiver support;Home environment access/layout;Wound Care;Insurance for SNF coverage;Weight;Weight bearing restrictions;Medication compliance PT Patient demonstrates impairments in the following area(s): Balance;Edema;Endurance;Pain;Safety;Sensory;Skin Integrity PT Transfers Functional Problem(s): Bed Mobility;Bed to Chair;Car;Furniture;Floor PT Locomotion Functional Problem(s): Ambulation;Wheelchair Mobility;Stairs PT Plan PT Intensity: Minimum of 1-2 x/day ,45 to 90 minutes PT Frequency: 5 out of 7 days PT Duration Estimated Length of Stay: 12-16 days PT Treatment/Interventions: Ambulation/gait training;Balance/vestibular training;Community reintegration;Discharge planning;Disease management/prevention;DME/adaptive equipment instruction;Functional mobility training;Neuromuscular re-education;Pain management;Patient/family education;Splinting/orthotics;Skin care/wound management;Psychosocial support;Stair training;Therapeutic Activities;Therapeutic Exercise;Visual/perceptual remediation/compensation;UE/LE Coordination activities;UE/LE Strength taining/ROM;Wheelchair propulsion/positioning PT Transfers Anticipated Outcome(s): mod I with LRAD PT Locomotion Anticipated Outcome(s): Supervision assist gait house hold distance. mod I WC mobility PT Recommendation Recommendations for Other Services: Therapeutic Recreation consult Therapeutic  Recreation Interventions: Outing/community reintergration;Stress management Follow Up Recommendations: Home health PT Patient destination: Home Equipment Recommended: Wheelchair (measurements);Wheelchair cushion (measurements);Rolling walker with 5" wheels   PT Evaluation Precautions/Restrictions Restrictions Weight Bearing Restrictions: Yes LLE Weight Bearing: Non weight bearing General   Vital Signs  Pain Pain Assessment Pain Scale: 0-10 Pain Score: 0-No pain Pain Interference Pain Interference Pain Effect on Sleep: 1. Rarely or not at all Pain Interference with Therapy Activities: 2. Occasionally Pain Interference with Day-to-Day Activities: 2. Occasionally Home Living/Prior Functioning Home Living Available Help at Discharge: Family;Available PRN/intermittently Type of Home: House Home Access: Stairs to enter;Ramped entrance Entrance Stairs-Number of Steps: 2 Entrance Stairs-Rails: Right;Left;Can reach both Home Layout: One level Bathroom Shower/Tub: Multimedia programmer: Standard Bathroom Accessibility: Yes  Lives With: Spouse;Family Prior Function Level of Independence: Independent with basic ADLs;Independent with gait;Independent with homemaking with ambulation  Able to Take Stairs?: Reciprically Driving: Yes Vocation: Full time employment Vocation Requirements: Programme researcher, broadcasting/film/video at [employer] - History Ability to See in Adequate Light: 1 Impaired Perception Perception: Within Functional Limits Praxis Praxis: Intact  Cognition Overall Cognitive Status: Within Functional Limits for tasks assessed Orientation Level: Oriented X4 Awareness: Appears intact Problem Solving: Appears intact Safety/Judgment: Appears intact Sensation Sensation Light Touch: Impaired Detail Peripheral sensation comments: peripheral neuropathy on the RLE. parasthesia at insicion site on the LLE. Light Touch Impaired Details: Impaired RLE;Impaired  LLE Proprioception: Appears Intact Coordination Gross Motor Movements are Fluid and Coordinated: Yes Fine Motor Movements are Fluid and Coordinated: Yes Finger Nose Finger Test: [facility] Heel Shin Test: unable to perform on the LLE Motor  Motor Motor: Within Functional Limits;Other (comment) Motor - Skilled Clinical Observations: generalized weakness   Trunk/Postural Assessment  Cervical Assessment Cervical Assessment: Within Functional Limits Thoracic Assessment Thoracic Assessment: Within Functional Limits Lumbar Assessment Lumbar Assessment: Exceptions to [facility] (discitis) Postural Control Postural Control: Within Functional Limits  Balance Balance Balance Assessed: Yes Static Sitting Balance Static Sitting - Balance Support: No upper extremity supported Static Sitting - Level of Assistance: 6: Modified independent (Device/Increase time) Dynamic Sitting Balance Dynamic Sitting - Balance Support: During functional activity;No upper extremity supported Dynamic Sitting - Level of Assistance: 5: Stand by assistance Static Standing Balance Static  Standing - Balance Support: Bilateral upper extremity supported Static Standing - Level of Assistance: 4: Min assist Dynamic Standing Balance Dynamic Standing - Balance Support: Bilateral upper extremity supported Dynamic Standing - Level of Assistance: 3: Mod assist Extremity Assessment      RLE Assessment RLE Assessment: Within Functional Limits General Strength Comments: grossly 5/5 LLE Assessment LLE Assessment: Exceptions to [facility] Passive Range of Motion (PROM) Comments: full knee extension. mild decrease in hip extension to 5 deg beyon neurtal in standing General Strength Comments: 4+/5 hip abduction/flexion/adduction. at least 3/5 knee flexion/extension  Care Tool Care Tool Bed Mobility Roll left and right activity   Roll left and right assist level: Supervision/Verbal cueing    Sit to lying activity   Sit to lying assist  level: Supervision/Verbal cueing    Lying to sitting on side of bed activity   Lying to sitting on side of bed assist level: the ability to move from lying on the back to sitting on the side of the bed with no back support.: Supervision/Verbal cueing     Care Tool Transfers Sit to stand transfer   Sit to stand assist level: Moderate Assistance - Patient 50 - 74%    Chair/bed transfer   Chair/bed transfer assist level: Moderate Assistance - Patient 50 - 74%     Psychologist, counselling transfer activity did not occur: Safety/medical concerns        Care Tool Locomotion Ambulation Ambulation activity did not occur: Safety/medical concerns        Walk 10 feet activity Walk 10 feet activity did not occur: Safety/medical concerns       Walk 50 feet with 2 turns activity Walk 50 feet with 2 turns activity did not occur: Safety/medical concerns      Walk 150 feet activity Walk 150 feet activity did not occur: Safety/medical concerns      Walk 10 feet on uneven surfaces activity Walk 10 feet on uneven surfaces activity did not occur: Safety/medical concerns      Stairs Stair activity did not occur: Safety/medical concerns        Walk up/down 1 step activity Walk up/down 1 step or curb (drop down) activity did not occur: Safety/medical concerns      Walk up/down 4 steps activity Walk up/down 4 steps activity did not occur: Safety/medical concerns      Walk up/down 12 steps activity Walk up/down 12 steps activity did not occur: Safety/medical concerns      Pick up small objects from floor Pick up small object from the floor (from standing position) activity did not occur: Safety/medical concerns      Wheelchair Is the patient using a wheelchair?: Yes Type of Wheelchair: Manual   Wheelchair assist level: Supervision/Verbal cueing Max wheelchair distance: 150  Wheel 50 feet with 2 turns activity   Assist Level: Supervision/Verbal cueing  Wheel 150 feet  activity   Assist Level: Supervision/Verbal cueing    Refer to Care Plan for Long Term Goals  SHORT TERM GOAL WEEK 1 PT Short Term Goal 1 (Week 1): Pt will transfer to [facility] with CGA and LRAD PT Short Term Goal 2 (Week 1): Pt will ambulate 52ft with mod assist and RW PT Short Term Goal 3 (Week 1): Pt will propel WC x 190ft without assist  Recommendations for other services: Therapeutic Recreation  Stress management and Outing/community reintegration  Skilled Therapeutic Intervention Mobility Bed Mobility Bed Mobility: Rolling Right;Rolling Left;Supine  to Sit;Sit to Supine Rolling Right: Supervision/verbal cueing Rolling Left: Supervision/Verbal cueing Supine to Sit: Supervision/Verbal cueing Sit to Supine: Supervision/Verbal cueing Transfers Transfers: Sit to Stand;Stand to Sit;Stand Pivot Transfers Sit to Stand: Minimal Assistance - Patient > 75% Stand to Sit: Minimal Assistance - Patient > 75% Stand Pivot Transfers: Minimal Assistance - Patient > 75% Transfer (Assistive device): Rolling walker Locomotion  Gait Ambulation: Yes Gait Assistance: Moderate Assistance - Patient 50-74% Gait Distance (Feet): 5 Feet Assistive device: Parallel bars Gait Gait: Yes Gait Pattern: Impaired Gait Pattern: Step-to pattern (swing to) Stairs / Additional Locomotion Stairs: No Wheelchair Mobility Wheelchair Mobility: Yes Wheelchair Assistance: Chartered loss adjuster: Both upper extremities Wheelchair Parts Management: Needs assistance Distance: 150  Pt received supine in bed and agreeable to PT. Supine>sit transfer with supervision assist and cues for NWB LLE. PT instructed patient in PT Evaluation and initiated treatment intervention; see above for results. PT educated patient in [place], rehab potential, rehab goals, and discharge recommendations along with recommendation for follow-up rehabilitation services. Sit<>stand to don pants with min-mod assist from elevated  bed. Stand pivot transfer with mod assist for AD management. WC mobility and gait training in parallel bars as listed above. Patient returned to room and left sitting in [facility] with call bell in reach and all needs met.        Discharge Criteria: Patient will be discharged from PT if patient refuses treatment 3 consecutive times without medical reason, if treatment goals not met, if there is a change in medical status, if patient makes no progress towards goals or if patient is discharged from hospital.  The above assessment, treatment plan, treatment alternatives and goals were discussed and mutually agreed upon: by patient  Lorie Phenix 03/06/2021, 12:25 PM

## 2021-03-06 NOTE — Progress Notes (Signed)
Patient admitted to unit approximately 1900. Admission notebook given to patient and rehab process reviewed. Patient alert x 4 and denies pain. Wound vac in place to Lt BKA

## 2021-03-07 DIAGNOSIS — Z89512 Acquired absence of left leg below knee: Secondary | ICD-10-CM | POA: Diagnosis not present

## 2021-03-07 LAB — GLUCOSE, CAPILLARY
Glucose-Capillary: 108 mg/dL — ABNORMAL HIGH (ref 70–99)
Glucose-Capillary: 76 mg/dL (ref 70–99)
Glucose-Capillary: 104 mg/dL — ABNORMAL HIGH (ref 70–99)
Glucose-Capillary: 116 mg/dL — ABNORMAL HIGH (ref 70–99)

## 2021-03-07 MED ORDER — SODIUM CHLORIDE 0.9% FLUSH
10.0000 mL | INTRAVENOUS | Status: DC | PRN
  Administered 2021-03-17 – 2021-03-22 (×3): 10 mL

## 2021-03-07 MED ORDER — SODIUM CHLORIDE 0.9% FLUSH
10.0000 mL | Freq: Two times a day (BID) | INTRAVENOUS | Status: DC
Start: 2021-03-07 — End: 2021-03-23
  Administered 2021-03-07 – 2021-03-22 (×22): 10 mL

## 2021-03-07 MED ORDER — ONDANSETRON HCL 4 MG PO TABS
4.0000 mg | ORAL_TABLET | Freq: Three times a day (TID) | ORAL | Status: DC | PRN
  Administered 2021-03-10: 4 mg via ORAL
  Filled 2021-03-07: qty 1

## 2021-03-07 NOTE — Progress Notes (Signed)
PROGRESS NOTE   Subjective/Complaints: No new complaints She asks about return to work She asks about aquatic prosthesis She asks about estimated d/c date  ROS: +anxiety as per nurse and therapy   Objective:   No results found. Recent Labs    03/06/21 1129  WBC 8.0  HGB 10.0*  HCT 32.3*  PLT 459*   Recent Labs    03/06/21 1129  CREATININE 0.44    Intake/Output Summary (Last 24 hours) at 03/07/2021 1354 Last data filed at 03/07/2021 0755 Gross per 24 hour  Intake 680 ml  Output 1450 ml  Net -770 ml        Physical Exam: Vital Signs Blood pressure 112/71, pulse 87, temperature 97.8 F (36.6 C), temperature source Oral, resp. rate 14, height 5\' 7"  (1.702 m), weight 117.7 kg, SpO2 98 %. Gen: no distress, normal appearing HEENT: oral mucosa pink and moist, NCAT Cardio: Reg rate Chest: normal effort, normal rate of breathing Abd: soft, non-distended Ext: no edema Psych: pleasant, normal affect Musculoskeletal:     Cervical back: Normal range of motion. No rigidity.     Comments: UE strength 5/5 B/L  RLE- 5/5 LLE- 5-/5 in HF/KE/KF [name] with wound VAC in place TTP over L anterior shoulder c/w L bicipital tendinitis  Skin:    General: Skin is warm and dry.     Comments: [name] with wound VAC in place RUE PICC in place- looks OK  Neurological:     Mental Status: She is alert.     Comments: Patient is alert.  No acute distress.  Oriented x3 and follows commands. Decreased to light touch below knee on RLE CN's OK B/L   Psychiatric:        Behavior: Behavior normal.     Comments: Anxious, and eager to work with therapy    Assessment/Plan: 1. Functional deficits which require 3+ hours per day of interdisciplinary therapy in a comprehensive inpatient rehab setting. Physiatrist is providing close team supervision and 24 hour management of active medical problems listed below. Physiatrist and rehab team  continue to assess barriers to discharge/monitor patient progress toward functional and medical goals  Care Tool:  Bathing    Body parts bathed by patient: Right arm, Left arm, Chest, Abdomen, Front perineal area, Right upper leg, Left upper leg, Face, Right lower leg   Body parts bathed by helper: Buttocks Body parts n/a: Left lower leg   Bathing assist Assist Level: Minimal Assistance - Patient > 75%     Upper Body Dressing/Undressing Upper body dressing   What is the patient wearing?: Hospital gown only    Upper body assist Assist Level: Set up assist    Lower Body Dressing/Undressing Lower body dressing      What is the patient wearing?: Hospital gown only     Lower body assist Assist for lower body dressing: Maximal Assistance - Patient 25 - 49%     Toileting Toileting Toileting Activity did not occur (Clothing management and hygiene only):  (with female urinal)  Toileting assist Assist for toileting: Supervision/Verbal cueing     Transfers Chair/bed transfer  Transfers assist     Chair/bed transfer assist  level: Moderate Assistance - Patient 50 - 74%     Locomotion Ambulation   Ambulation assist   Ambulation activity did not occur: Safety/medical concerns          Walk 10 feet activity   Assist  Walk 10 feet activity did not occur: Safety/medical concerns        Walk 50 feet activity   Assist Walk 50 feet with 2 turns activity did not occur: Safety/medical concerns         Walk 150 feet activity   Assist Walk 150 feet activity did not occur: Safety/medical concerns         Walk 10 feet on uneven surface  activity   Assist Walk 10 feet on uneven surfaces activity did not occur: Safety/medical concerns         Wheelchair     Assist Is the patient using a wheelchair?: Yes Type of Wheelchair: Manual    Wheelchair assist level: Supervision/Verbal cueing Max wheelchair distance: 150    Wheelchair 50 feet with 2  turns activity    Assist        Assist Level: Supervision/Verbal cueing   Wheelchair 150 feet activity     Assist      Assist Level: Supervision/Verbal cueing   Blood pressure 112/71, pulse 87, temperature 97.8 F (36.6 C), temperature source Oral, resp. rate 14, height 5\' 7"  (1.702 m), weight 117.7 kg, SpO2 98 %.    Medical Problem List and Plan: 1. 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  2.  Impaired mobility: Continue Lovenox.  Venous Doppler is negative             -antiplatelet therapy: N/A 3. Pain from L4/5 discitis- Continue OxyContin sustained-release 10 mg every 12 hours,, Robaxin-750 milligram every 6 hours, Naprosyn 500 mg twice daily oxycodone as needed 4. Anxiety: Continue Celexa 40 mg daily, Ambien 5 mg nightly.  Provide emotional support             -antipsychotic agents: N/A 5. Neuropsych: This patient is capable of making decisions on her own behalf. 6. Skin/Wound Care: Routine skin checks 7. Fluids/Electrolytes/Nutrition: Routine in and outs with follow-up chemistries 8.  ID/MSSA bacteremia.  Continue Ancef 2 g every 8 hours through 04/14/2021 and stop- per ID.  9.  Acute blood loss anemia.  Follow-up CBC 10.  Diabetes mellitus with peripheral neuropathy. A1c 13- had for 14 years;  NovoLog 8 units 3 times daily with meals, Levemir 55 units nightly, Tradjenta 5 mg daily, Glucophage 500 mg daily.  Check blood sugars before meals and at bedtime.  Diabetic teaching 11.  Hyperlipidemia.  Pravachol 12.  Obesity.  Dietary follow-up 13. L shoulder bicipital tendinitis- suggest starting Voltaren gel 2 G QID 14. [name]- went over risk  of L hip and L knee contractures which will make prosthesis fitting more difficult- advised pt to lay on stomach or on side and stretch hip and knee/extend them multiple times per day.     LOS: 2 days A FACE TO FACE EVALUATION WAS PERFORMED  06/12/2021 P Lori Schmidt 03/07/2021, 1:54 PM

## 2021-03-07 NOTE — Progress Notes (Signed)
Occupational Therapy Session Note  Patient Details  Name: Lori Schmidt MRN: [id number] Date of Birth: March 15, 1976  Today's Date: 03/08/2021 OT Individual Time: 7619-5093 OT Individual Time Calculation (min): 73 min   Short Term Goals: Week 1:  OT Short Term Goal 1 (Week 1): Pt will perform BSC/toilet transfer with CGA and LRAD OT Short Term Goal 2 (Week 1): Pt will perform LB dress in most appropriate environment CGA OT Short Term Goal 3 (Week 1): Pt will perform sit <> stands at LRAD consistently with CGA in prep for ADL OT Short Term Goal 4 (Week 1): Pt will don limb guard with Supervision  Skilled Therapeutic Interventions/Progress Updates:    Pt greeted while sitting EOB, requesting to use the restroom. Pt was not wearing pants so stand pivot<w/c completed with Mod A using RW, pt limited with her ability to hop backwards so OT had to pull the chair closer to her before sitting down. The same assistance required for stand pivot<elevated toilet after. Pt with B+B void, able to complete hygiene herself at seated level utilizing lateral leans as necessary. Min A for squat pivot<w/c afterwards. OT washed her hair using the hair washing tray to increase psychosocial health, Mod A to untangle knots. Pt then completed full BADL at seated level in front of the sink, Max A for LB dressing and pt able to lean laterally in order for OT to pull underwear + pants over hips. Setup for oral care/grooming tasks. Mod A to don her limb guard. Pt remained sitting up at close of session, all need within reach. Tx focus placed on adaptive self care skills, functional transfers, and dynamic balance.   Pt teary regarding her progress often during session. OT provided emotional support and encouragement throughout tx.   Therapy Documentation Precautions:  Precautions Precautions: Fall Precaution Comments: [name], anxiety Required Braces or Orthoses: Other Brace Other Brace: limb  protector Restrictions Weight Bearing Restrictions: Yes LLE Weight Bearing: Non weight bearing Pain: no c/o pain during tx   ADL: ADL Eating: Not assessed Grooming: Setup Upper Body Bathing: Supervision/safety Lower Body Bathing: Minimal assistance Upper Body Dressing: Setup Lower Body Dressing: Maximal assistance Toileting: Not assessed Toilet Transfer: Moderate assistance Toilet Transfer Method: Stand pivot  Therapy/Group: Individual Therapy  Ngoc Daughtridge A Oney Folz 03/08/2021, 11:50 AM

## 2021-03-07 NOTE — Discharge Instructions (Addendum)
Inpatient Rehab Discharge Instructions  Lori Schmidt Discharge date and time: No discharge date for patient encounter.   Activities/Precautions/ Functional Status: Activity: As tolerated Diet: Diabetic diet Wound Care: Cleanse BKA site warm Dial soap and water Functional status:  ___ No restrictions     ___ Walk up steps independently ___ 24/7 supervision/assistance   ___ Walk up steps with assistance ___ Intermittent supervision/assistance  ___ Bathe/dress independently ___ Walk with walker     __x_ Bathe/dress with assistance ___ Walk Independently    ___ Shower independently ___ Walk with assistance    ___ Shower with assistance ___ No alcohol     ___ Return to work/school ________  Special Instructions: No driving smoking or alcohol  Continue intravenous Ancef 2 g every 8 hours through 04/14/2021 and stop    COMMUNITY REFERRALS UPON DISCHARGE:    Home Health:   RN                  Agency:[facility] Phone: [phone]  Outpatient: PT             Agency:[facility]  [address] [place] [address] [phone]              Appointment Date/Time:WILL CALL PATIENT OR MOM TO ARRANGE APPOINTMENT  Medical Equipment/Items Ordered: WHEELCHAIR AND BARIATRIC DROP-ARM BEDSIDE COMMODE                                                 Agency/Supplier:[facility]   [phone]  GENERAL COMMUNITY RESOURCES FOR PATIENT/FAMILY: Support Groups: [employer] SUPPORT GROUP SECOND Tuesday OF EACH MONTH NEXT ONE IS 04/22/2021 7:00-8:30 PM @ [facility] [address] [place] [address] CONTACT ROBIN WALDRON [phone]  FINANCIAL ASSISTANCE FORMS GIVEN TO PT DUE TO CONCERNED ABOUT THE HOSPITAL BILL   My questions have been answered and I understand these instructions. I will adhere to these goals and the provided educational materials after my discharge from the hospital.  Patient/Caregiver Signature  _______________________________ Date __________  Clinician Signature _______________________________________ Date __________  Please bring this form and your medication list with you to all your follow-up doctor's appointments.

## 2021-03-08 DIAGNOSIS — Z89512 Acquired absence of left leg below knee: Secondary | ICD-10-CM | POA: Diagnosis not present

## 2021-03-08 LAB — CBC WITH DIFFERENTIAL/PLATELET
Abs Immature Granulocytes: 0.03 10*3/uL (ref 0.00–0.07)
Basophils Absolute: 0 10*3/uL (ref 0.0–0.1)
Basophils Relative: 0 %
Eosinophils Absolute: 1.3 10*3/uL — ABNORMAL HIGH (ref 0.0–0.5)
Eosinophils Relative: 20 %
HCT: 29 % — ABNORMAL LOW (ref 36.0–46.0)
Hemoglobin: 9.5 g/dL — ABNORMAL LOW (ref 12.0–15.0)
Immature Granulocytes: 1 %
Lymphocytes Relative: 28 %
Lymphs Abs: 1.7 10*3/uL (ref 0.7–4.0)
MCH: 28.7 pg (ref 26.0–34.0)
MCHC: 32.8 g/dL (ref 30.0–36.0)
MCV: 87.6 fL (ref 80.0–100.0)
Monocytes Absolute: 0.4 10*3/uL (ref 0.1–1.0)
Monocytes Relative: 7 %
Neutro Abs: 2.8 10*3/uL (ref 1.7–7.7)
Neutrophils Relative %: 44 %
Platelets: 347 10*3/uL (ref 150–400)
RBC: 3.31 MIL/uL — ABNORMAL LOW (ref 3.87–5.11)
RDW: 12.6 % (ref 11.5–15.5)
WBC: 6.3 10*3/uL (ref 4.0–10.5)
nRBC: 0 % (ref 0.0–0.2)

## 2021-03-08 LAB — COMPREHENSIVE METABOLIC PANEL
ALT: 7 U/L (ref 0–44)
AST: 12 U/L — ABNORMAL LOW (ref 15–41)
Albumin: 2.3 g/dL — ABNORMAL LOW (ref 3.5–5.0)
Alkaline Phosphatase: 82 U/L (ref 38–126)
Anion gap: 9 (ref 5–15)
BUN: 20 mg/dL (ref 6–20)
CO2: 25 mmol/L (ref 22–32)
Calcium: 8.2 mg/dL — ABNORMAL LOW (ref 8.9–10.3)
Chloride: 102 mmol/L (ref 98–111)
Creatinine, Ser: 0.45 mg/dL (ref 0.44–1.00)
GFR, Estimated: >60 mL/min (ref >60)
Glucose, Bld: 152 mg/dL — ABNORMAL HIGH (ref 70–99)
Potassium: 3.5 mmol/L (ref 3.5–5.1)
Sodium: 136 mmol/L (ref 135–145)
Total Bilirubin: <0.1 mg/dL — ABNORMAL LOW (ref 0.3–1.2)
Total Protein: 6.1 g/dL — ABNORMAL LOW (ref 6.5–8.1)

## 2021-03-08 LAB — GLUCOSE, CAPILLARY
Glucose-Capillary: 127 mg/dL — ABNORMAL HIGH (ref 70–99)
Glucose-Capillary: 112 mg/dL — ABNORMAL HIGH (ref 70–99)
Glucose-Capillary: 161 mg/dL — ABNORMAL HIGH (ref 70–99)
Glucose-Capillary: 97 mg/dL (ref 70–99)

## 2021-03-08 NOTE — Progress Notes (Signed)
Physical Therapy Session Note  Patient Details  Name: Lori Schmidt MRN: [id number] Date of Birth: 1975-12-18  Today's Date: 03/08/2021 PT Individual Time: 2202-5427, 0623-7628 PT Individual Time Calculation (min): 70 min, 28 min   Short Term Goals: Week 1:  PT Short Term Goal 1 (Week 1): Pt will transfer to [facility] with CGA and LRAD PT Short Term Goal 2 (Week 1): Pt will ambulate 5ft with mod assist and RW PT Short Term Goal 3 (Week 1): Pt will propel WC x 154ft without assist  Skilled Therapeutic Interventions/Progress Updates:    Session 1: Pt seated in w/c on arrival and agreeable to therapy. Pt reports soreness in her L arm at rest and back pain with activity, unrated. Pt received pain medication shortly before session but believes it had not taken full effect, therapy to tolerance as no other intervention required. On arrival, pt was emotional and became tearful talking about what she feels like she has lost. Therapist provided psychosocial support, including discussing support groups, watching videos of amputees walking and running to help her visualize recovery, and providing reflective listening. Pt demoed improved mood and determination after conversation. Pt transported to therapy gym for time management and energy conservation. Remainder of session focused on standing activity in the // bars, including squats, which pt had difficulty coordinating d/t weakness, heel raises with and without knee bend to progress toward hopping gait, and 3 way hip. Pt reported shaking in her R leg. Discussed debility from hospital stay and length of time needed to recover strength. Pt returned to room after session and remained in w/c with all needs in reach.  Session 2: Pt seated in w/c on arrival and agreeable to therapy. No complaint of pain, premedicated. Pt propelled w/c with BUE to/from day room for endurance and functional mobility. Attempted Sit to stand to RW, but pt was too fatigued from  previous session. Transitioned to kinetron and performed x 1 min, x 2 min, and x 3 min at 20 cm/sec with RLE only for LE strength and endurance. Pt returned to room after session and remained in w/c with all needs in reach.  Therapy Documentation Precautions:  Precautions Precautions: Fall Precaution Comments: [name], anxiety Required Braces or Orthoses: Other Brace Other Brace: limb protector Restrictions Weight Bearing Restrictions: Yes LLE Weight Bearing: Non weight bearing     Therapy/Group: Individual Therapy  Juluis Rainier 03/08/2021, 12:33 PM

## 2021-03-08 NOTE — Progress Notes (Signed)
Inpatient Rehabilitation  Patient information reviewed and entered into eRehab system by Isley Zinni French Kendra, OTR/L.   Information including medical coding, functional ability and quality indicators will be reviewed and updated through discharge.    

## 2021-03-08 NOTE — Progress Notes (Signed)
Patient ID: Dajai Wahlert, female   DOB: 1975-04-21, 45 y.o.   MRN: [id number]  Met with patient and her mother in patient's room. I introduced myself and explained my role in her care. I also went over additional handouts that I gave to her including Living With Diabetes, Preventing High Cholesterol, Insulin Aspart Injection, Hypoglycemia, and Hypertension Adult. She had questions concerning her prothesis for which I answered and she stated verbal understanding. We discussed phantom pain and ways to deal with it and she stated that she has been doing her own insulin injections and has no questions concerning insulin. She asked if we could assist and finding a new general practitioner. I told her that I would mention it to her SW. I will continue to monitor this patient.  Dorthula Nettles, RN3, BSN, CBIS, [place], [facility], Inpatient Rehabilitation Office [phone] Cell [phone]

## 2021-03-08 NOTE — Progress Notes (Signed)
PROGRESS NOTE   Subjective/Complaints:  Pt reports trying to keep her leg straight-  Back pain better- no major spasms in last 48 hours- Bowels working "OK".  Asking when VAC to be removed. Appears was placed 11/11- so will remove today.    ROS:  Pt denies SOB, abd pain, CP, N/V/C/D, and vision changes  Objective:   No results found. Recent Labs    03/06/21 1129 03/08/21 0307  WBC 8.0 6.3  HGB 10.0* 9.5*  HCT 32.3* 29.0*  PLT 459* 347   Recent Labs    03/06/21 1129 03/08/21 0307  NA  --  136  K  --  3.5  CL  --  102  CO2  --  25  GLUCOSE  --  152*  BUN  --  20  CREATININE 0.44 0.45  CALCIUM  --  8.2*    Intake/Output Summary (Last 24 hours) at 03/08/2021 0840 Last data filed at 03/07/2021 1830 Gross per 24 hour  Intake 660 ml  Output --  Net 660 ml        Physical Exam: Vital Signs Blood pressure 117/71, pulse 90, temperature 98.3 F (36.8 C), temperature source Oral, resp. rate 14, height $RemoveBe'5\' 7"'VEJkueoty$  (1.702 m), weight 117.7 kg, SpO2 97 %.   General: awake, alert, appropriate, sitting up in bed; asking amputation questions; NAD HENT: conjugate gaze; oropharynx moist CV: regular rate; no JVD Pulmonary: CTA B/L; no W/R/R- good air movement GI: soft, NT, ND, (+)BS Psychiatric: appropriate but somewhat anxious Neurological: Ox3 Skin: VAC on [name]- nothing in [facility] container Musculoskeletal:     Cervical back: Normal range of motion. No rigidity.     Comments: UE strength 5/5 B/L  RLE- 5/5 LLE- 5-/5 in HF/KE/KF [name] with wound VAC in place TTP over L anterior shoulder c/w L bicipital tendinitis  Skin:    General: Skin is warm and dry.     Comments: [name] with wound VAC in place RUE PICC in place- looks OK  Neurological:     Mental Status: She is alert.     Comments: Patient is alert.  No acute distress.  Oriented x3 and follows commands. Decreased to light touch below knee on RLE CN's OK B/L    Psychiatric:        Behavior: Behavior normal.     Comments: Anxious, and eager to work with therapy    Assessment/Plan: 1. Functional deficits which require 3+ hours per day of interdisciplinary therapy in a comprehensive inpatient rehab setting. Physiatrist is providing close team supervision and 24 hour management of active medical problems listed below. Physiatrist and rehab team continue to assess barriers to discharge/monitor patient progress toward functional and medical goals  Care Tool:  Bathing    Body parts bathed by patient: Right arm, Left arm, Chest, Abdomen, Front perineal area, Right upper leg, Left upper leg, Face, Right lower leg   Body parts bathed by helper: Buttocks Body parts n/a: Left lower leg   Bathing assist Assist Level: Minimal Assistance - Patient > 75%     Upper Body Dressing/Undressing Upper body dressing   What is the patient wearing?: Hospital gown only  Upper body assist Assist Level: Set up assist    Lower Body Dressing/Undressing Lower body dressing      What is the patient wearing?: Hospital gown only     Lower body assist Assist for lower body dressing: Minimal Assistance - Patient > 75%     Toileting Toileting Toileting Activity did not occur (Clothing management and hygiene only):  (on BSC)  Toileting assist Assist for toileting: Moderate Assistance - Patient 50 - 74%     Transfers Chair/bed transfer  Transfers assist     Chair/bed transfer assist level: Moderate Assistance - Patient 50 - 74%     Locomotion Ambulation   Ambulation assist   Ambulation activity did not occur: Safety/medical concerns          Walk 10 feet activity   Assist  Walk 10 feet activity did not occur: Safety/medical concerns        Walk 50 feet activity   Assist Walk 50 feet with 2 turns activity did not occur: Safety/medical concerns         Walk 150 feet activity   Assist Walk 150 feet activity did not occur:  Safety/medical concerns         Walk 10 feet on uneven surface  activity   Assist Walk 10 feet on uneven surfaces activity did not occur: Safety/medical concerns         Wheelchair     Assist Is the patient using a wheelchair?: Yes Type of Wheelchair: Manual    Wheelchair assist level: Supervision/Verbal cueing Max wheelchair distance: 150    Wheelchair 50 feet with 2 turns activity    Assist        Assist Level: Supervision/Verbal cueing   Wheelchair 150 feet activity     Assist      Assist Level: Supervision/Verbal cueing   Blood pressure 117/71, pulse 90, temperature 98.3 F (36.8 C), temperature source Oral, resp. rate 14, height $RemoveBe'5\' 7"'lTFvnkbQl$  (1.702 m), weight 117.7 kg, SpO2 97 %.    Medical Problem List and Plan: 1. 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   Con't CIR- PT and OT- will order 2 shrinkers since removing VAC today. Changed/added dressing changes for [name] 2.  Impaired mobility: Continue Lovenox.  Venous Doppler is negative             -antiplatelet therapy: N/A 3. Pain from L4/5 discitis- Continue OxyContin sustained-release 10 mg every 12 hours,, Robaxin-750 milligram every 6 hours, Naprosyn 500 mg twice daily oxycodone as needed  11/28-pain controlled per pt- con't regimen 4. Anxiety: Continue Celexa 40 mg daily, Ambien 5 mg nightly.  Provide emotional support             -antipsychotic agents: N/A 5. Neuropsych: This patient is capable of making decisions on her own behalf. 6. Skin/Wound Care: Routine skin checks 7. Fluids/Electrolytes/Nutrition: Routine in and outs with follow-up chemistries 8.  ID/MSSA  bacteremia.  Continue Ancef 2 g every 8 hours through 04/14/2021 and stop- per ID.   11/28- will make sure gets ESR/CRP, CMP and CBC with diff q Monday.  9.  Acute blood loss anemia.  Follow-up CBC 10.  Diabetes mellitus with peripheral neuropathy. A1c 13- had for 14 years;  NovoLog 8 units 3 times daily with meals, Levemir 55 units nightly, Tradjenta 5 mg daily, Glucophage 500 mg daily.  Check blood sugars before meals and at bedtime.  Diabetic teaching  11/28- BG's 76-127 in last 24 hours- pt has been educated this control is essential to get wound healed.  11.  Hyperlipidemia.  Pravachol 12.  Obesity.  Dietary follow-up 13. L shoulder bicipital tendinitis- suggest starting Voltaren gel 2 G QID 14. [name]- went over risk of L hip and L knee contractures which will make prosthesis fitting more difficult- advised pt to lay on stomach or on side and stretch hip and knee/extend them multiple times per day.   11/28- went over again- explained if lays on stomach 2x/day for ~ 10 minutes, should work well.     LOS: 3 days A FACE TO FACE EVALUATION WAS PERFORMED  Clearnce Leja 03/08/2021, 8:40 AM

## 2021-03-08 NOTE — IPOC Note (Addendum)
Overall Plan of Care [facility]) Patient Details Name: Lori Schmidt MRN: [id number] DOB: 1975-07-09  Admitting Diagnosis: S/P BKA (below knee amputation) unilateral, left [facility])  Hospital Problems: Principal Problem:   S/P BKA (below knee amputation) unilateral, left (HCC) Active Problems:   Discitis of lumbar region   Left below-knee amputee [facility])     Functional Problem List: Nursing Behavior, Edema, Endurance, Medication Management, Nutrition, Pain, Skin Integrity, Safety  PT Balance, Edema, Endurance, Pain, Safety, Sensory, Skin Integrity  OT Balance, Endurance, Motor, Safety, Sensory, Skin Integrity  SLP    TR         Basic ADL's: OT Grooming, Bathing, Dressing, Toileting     Advanced  ADL's: OT Simple Meal Preparation     Transfers: PT Bed Mobility, Bed to Chair, Car, [employer], Civil Service fast streamer, Research scientist (life sciences): PT Ambulation, Psychologist, prison and probation services, Stairs     Additional Impairments: OT    SLP        TR      Anticipated Outcomes Item Anticipated Outcome  Self Feeding no goal  Swallowing      Basic self-care  Supervision  Toileting  Mod I   Bathroom Transfers Supervision  Bowel/Bladder  min assist  Transfers  mod I with LRAD  Locomotion  Supervision assist gait house hold distance. mod I WC mobility  Communication     Cognition     Pain  < 3  Safety/Judgment  min assist and no falls   Therapy Plan: PT Intensity: Minimum of 1-2 x/day ,45 to 90 minutes PT Frequency: 5 out of 7 days PT Duration Estimated Length of Stay: 12-16 days OT Intensity: Minimum of 1-2 x/day, 45 to 90 minutes OT Frequency: 5 out of 7 days OT Duration/Estimated Length of Stay: 10-14 days     Due to the current state of emergency, patients may not be receiving their 3-hours of Medicare-mandated therapy.   Team Interventions: Nursing Interventions Patient/Family Education, Disease Management/Prevention, Pain Management, Medication Management, Skin  Care/Wound Management, Discharge Planning, Psychosocial Support  PT interventions Ambulation/gait training, Warden/ranger, Community reintegration, Discharge planning, Disease management/prevention, DME/adaptive equipment instruction, Functional mobility training, Neuromuscular re-education, Pain management, Patient/family education, Splinting/orthotics, Skin care/wound management, Psychosocial support, Stair training, Therapeutic Activities, Therapeutic Exercise, Visual/perceptual remediation/compensation, UE/LE Coordination activities, UE/LE Strength taining/ROM, Wheelchair propulsion/positioning  OT Interventions Balance/vestibular training, Discharge planning, Pain management, Self Care/advanced ADL retraining, Therapeutic Activities, UE/LE Coordination activities, Visual/perceptual remediation/compensation, Therapeutic Exercise, Skin care/wound managment, Patient/family education, Functional mobility training, Disease mangement/prevention, Cognitive remediation/compensation, Community reintegration, Fish farm manager, Neuromuscular re-education, Psychosocial support, Splinting/orthotics, UE/LE Strength taining/ROM, Wheelchair propulsion/positioning  SLP Interventions    TR Interventions    SW/CM Interventions     Barriers to Discharge MD  Medical stability, Home enviroment access/loayout, IV antibiotics, Wound care, Lack of/limited family support, Weight, Weight bearing restrictions, and Medication compliance  Nursing Decreased caregiver support, IV antibiotics, Wound Care, Lack of/limited family support, Weight, Weight bearing restrictions, Medication compliance, Behavior discharging to a 1 level home with 5-6 steps to enter. Patient can reach both rails. 60 y/o daughter lives with patient but mother will be primary caregiver and can provide min assist.  PT Inaccessible home environment, Decreased caregiver support, Home environment access/layout, Wound Care, Insurance  for SNF coverage, Weight, Weight bearing restrictions, Medication compliance    OT      SLP      SW       Team Discharge Planning: Destination: PT-Home ,OT- Home , SLP-  Projected Follow-up:  PT-Home health PT, OT-  Home health OT, SLP-  Projected Equipment Needs: PT-Wheelchair (measurements), Wheelchair cushion (measurements), Rolling walker with 5" wheels, OT- To be determined, SLP-  Equipment Details: PT- , OT-  Patient/family involved in discharge planning: PT- Patient,  OT-Patient, SLP-   MD ELOS: 12-16 days Medical Rehab Prognosis:  Good Assessment: Pt is a 45 yr old female with hx of DM x 14 years with A1c of 13- s/p [name] for necrotic foot ulcer and osteomyelitis as well as bacteremia and diskitis of L/4L5- on Ancef- til 04/14/21.  Working on preprosthetic training and gait  Goals mod I to supervision  See Team Conference Notes for weekly updates to the plan of care

## 2021-03-08 NOTE — Progress Notes (Signed)
Inpatient Rehabilitation Care Coordinator Assessment and Plan Patient Details  Name: Lori Schmidt MRN: [id number] Date of Birth: 07/11/1975  Today's Date: 03/08/2021  Hospital Problems: Principal Problem:   S/P BKA (below knee amputation) unilateral, left (HCC) Active Problems:   Discitis of lumbar region   Left below-knee amputee [facility])  Past Medical History:  Past Medical History:  Diagnosis Date   Hyperlipidemia    Hypertension    Obesity    Type 2 diabetes mellitus (HCC)    Past Surgical History:  Past Surgical History:  Procedure Laterality Date   AMPUTATION Left 02/19/2021   Procedure: AMPUTATION BELOW KNEE;  Surgeon: Nadara Mustard, MD;  Location: [facility] OR;  Service: Orthopedics;  Laterality: Left;   BREAST CYST EXCISION Right 05/2018   four cysts removed on right breast/axilla area   TEE WITHOUT CARDIOVERSION N/A 02/23/2021   Procedure: TRANSESOPHAGEAL ECHOCARDIOGRAM (TEE);  Surgeon: Chrystie Nose, MD;  Location: [facility] ENDOSCOPY;  Service: Cardiovascular;  Laterality: N/A;   Social History:  reports that she quit smoking about 10 years ago. Her smoking use included cigarettes. She has never used smokeless tobacco. She reports that she does not drink alcohol and does not use drugs.  Family / Support Systems Marital Status: Divorced Patient Roles: Parent, Other (Comment) (employee) Children: 43 yo son and 2 yo daughter Other Supports: [name] [phone]  sister Anticipated Caregiver: Bonita Quin Ability/Limitations of Caregiver: Momis retired and can provide some physical assist Caregiver Availability: 24/7 Family Dynamics: Close with her Mom and children, she has a sister who is caring for her animals and helping with daughter. Pt feels she has good family supports.  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 Return to Work Plans: Plans to return to work when able Marine scientist Issues: No issues Guardian/Conservator: None-according to MD pt is capable of making her own decisions while here   Abuse/Neglect Abuse/Neglect Assessment Can Be Completed: Yes Physical Abuse: Denies Verbal Abuse: Denies Sexual Abuse: Denies Exploitation of patient/patient's resources: Denies Self-Neglect: Denies  Patient response to: Social Isolation - How often do you feel lonely or isolated from those around you?: Sometimes  Emotional Status Pt's affect, behavior and adjustment status: Pt is motivated to improve and get her life back. She is still processing her situation and who sick she was and the loss of her leg. She is taking each day at a time and moving forward. Recent Psychosocial Issues: other health issues Psychiatric History: History of depression takes medications for which she finds them helpful. Wants to have peer support while here will look into for her Substance Abuse History: No issues  Patient / Family Perceptions, Expectations & Goals Pt/Family understanding of illness & functional limitations: Pt is able to explain her infection and how fast this all happened. She feels it is God's way of telling her to slow down. She does talk with the MD and feels she is being heard and her questions are being answered. Premorbid pt/family roles/activities: Mom, daughter, employee, friend, colleague, sibling, etc Anticipated changes in roles/activities/participation: resume Pt/family expectations/goals: Pt states: " I will push myself since this is who I am. I will walk again."  Mom states: " I am hopeful she will do  well here."  [employer]: None Premorbid Home Care/DME Agencies:  Other (Comment) (rw and ttb from others) Transportation available at discharge: Pt drove PTA, will rely upon family Is the patient able to respond to transportation needs?: Yes In the past 12 months, has lack of transportation kept you from medical appointments or from getting medications?: No In the past 12 months, has lack of transportation kept you from meetings, work, or from getting things needed for daily living?: No Resource referrals recommended: Neuropsychology, Support group (specify)  Discharge Planning Living Arrangements: Children Support Systems: Children, Parent, Other relatives, Friends/neighbors Type of Residence: Private residence Insurance Resources: Media planner (specify) Counselling psychologist) Financial Resources: Employment Financial Screen Referred: No Living Expenses: Own Money Management: Patient Does the patient have any problems obtaining your medications?: No Home Management: Patient will rely upon family now Patient/Family Preliminary Plans: Plans to go to parents home since it is more accessible than her's. Her Mom is retired and can assist her if needed. Her hope is she will not need assist and will be mod/i by discharge. Care Coordinator Barriers to Discharge: Insurance for SNF coverage Care Coordinator Anticipated Follow Up Needs: HH/OP Clinical Impression Pleasant female who is still struggling with the loss of her leg and cries daily according to her. Would benefit from seeing neuro-psych and peer support while here. Will work on discharge needs and continue to provide support.  [name] 03/08/2021, 1:01 PM

## 2021-03-08 NOTE — Progress Notes (Signed)
[facility]patient Rehabilitation Center Individual Statement of Services  Patient Name:  Lori Schmidt  Date:  03/08/2021  Welcome to the Inpatient Rehabilitation [facility].  Our goal is to provide you with an individualized program based on your diagnosis and situation, designed to meet your specific needs.  With this comprehensive rehabilitation program, you will be expected to participate in at least 3 hours of rehabilitation therapies Monday-Friday, with modified therapy programming on the weekends.  Your rehabilitation program will include the following services:  Physical Therapy (PT), Occupational Therapy (OT), 24 hour per day rehabilitation nursing, Therapeutic Recreaction (TR), Neuropsychology, Care Coordinator, Rehabilitation Medicine, Nutrition Services, and Pharmacy Services  Weekly team conferences will be held on Tuesday to discuss your progress.  Your Inpatient Rehabilitation Care Coordinator will talk with you frequently to get your input and to update you on team discussions.  Team conferences with you and your family in attendance may also be held.  Expected length of stay: 12-16 days  Overall anticipated outcome: supervision with cues  Depending on your progress and recovery, your program may change. Your Inpatient Rehabilitation Care Coordinator will coordinate services and will keep you informed of any changes. Your Inpatient Rehabilitation Care Coordinator's name and contact numbers are listed  below.  The following services may also be recommended but are not provided by the Inpatient Rehabilitation Center:  Driving Evaluations Home Health Rehabiltiation Services Outpatient Rehabilitation Services Vocational Rehabilitation   Arrangements will be made to provide these services after discharge if needed.  Arrangements include referral to agencies that provide these services.  Your insurance has been verified to be:  [place] Your primary doctor is:  Primus Bravo  Pertinent  information will be shared with your doctor and your insurance company.  Inpatient Rehabilitation Care Coordinator:  Dossie Der, [place] [phone] or Luna Glasgow  Information discussed with and copy given to patient by: [name], 03/08/2021, 12:50 PM

## 2021-03-08 NOTE — Progress Notes (Signed)
Orthopedic Tech Progress Note Patient Details:  Lori Schmidt 03-05-1976 [id number]  Patient ID: Lori Schmidt, female   DOB: December 17, 1975, 45 y.o.   MRN: [id number] Placed order with [facility] for BK Shrinker.  Darleen Crocker 03/08/2021, 8:27 AM

## 2021-03-09 DIAGNOSIS — Z89512 Acquired absence of left leg below knee: Secondary | ICD-10-CM | POA: Diagnosis not present

## 2021-03-09 LAB — GLUCOSE, CAPILLARY
Glucose-Capillary: 127 mg/dL — ABNORMAL HIGH (ref 70–99)
Glucose-Capillary: 132 mg/dL — ABNORMAL HIGH (ref 70–99)
Glucose-Capillary: 108 mg/dL — ABNORMAL HIGH (ref 70–99)
Glucose-Capillary: 135 mg/dL — ABNORMAL HIGH (ref 70–99)

## 2021-03-09 NOTE — Progress Notes (Signed)
Physical Therapy Session Note  Patient Details  Name: Lori Schmidt MRN: [id number] Date of Birth: December 10, 1975  Today's Date: 03/09/2021 PT Individual Time: 0900-1004 PT Individual Time Calculation (min): 64 min   Short Term Goals: Week 1:  PT Short Term Goal 1 (Week 1): Pt will transfer to [facility] with CGA and LRAD PT Short Term Goal 2 (Week 1): Pt will ambulate 48ft with mod assist and RW PT Short Term Goal 3 (Week 1): Pt will propel WC x 117ft without assist  Skilled Therapeutic Interventions/Progress Updates:    pt received in bed and agreeable to therapy. Pt premedicated for pain, 6/10 back pain with activity. Session focused on bed mobility and amputee exercises and education. Pt rolled on flat bed with bed rails with VC throughout.  Pt performed the following exercises to promote LE strength and endurance:  -prone hip extension, BIL 3x12 -sidelying hip abduction, 3x10 BIL -supine SLR, 3 x 10 BIL  -prone knee flexion x 30 sec, progressing to 10 sec hold x 5  -Sidelying rainbows x12  Therapist provided education and answered pt and family questions during session, including education on typical return to function, and positioning to prevent contractures. Pt remained in bed with family present and was left with all needs in reach and alarm active.    Therapy Documentation Precautions:  Precautions Precautions: Fall Precaution Comments: [name], anxiety Required Braces or Orthoses: Other Brace Other Brace: limb protector Restrictions Weight Bearing Restrictions: Yes LLE Weight Bearing: Non weight bearing General:   Vital Signs:   Pain: Pain Assessment Pain Scale: 0-10 Pain Score: 4  Mobility:   Locomotion :    Trunk/Postural Assessment :    Balance:   Exercises:   Other Treatments:      Therapy/Group: Individual Therapy  Juluis Rainier 03/09/2021, 9:19 AM

## 2021-03-09 NOTE — Progress Notes (Signed)
PROGRESS NOTE   Subjective/Complaints:  Pt reports trying to keep LLE straight- did get shrinkers- 1 set too small, but [facility] explained will fit soon- wearing current shrinker now.  Said therapy "kicked her butt" but also sat up til 7pm last night, so is sore.   However back pain is tolerable/manageable.   LBM yesterday.  Working hard on [place].   ROS:   Pt denies SOB, abd pain, CP, N/V/C/D, and vision changes    Objective:   No results found. Recent Labs    03/06/21 1129 03/08/21 0307  WBC 8.0 6.3  HGB 10.0* 9.5*  HCT 32.3* 29.0*  PLT 459* 347   Recent Labs    03/06/21 1129 03/08/21 0307  NA  --  136  K  --  3.5  CL  --  102  CO2  --  25  GLUCOSE  --  152*  BUN  --  20  CREATININE 0.44 0.45  CALCIUM  --  8.2*    Intake/Output Summary (Last 24 hours) at 03/09/2021 0845 Last data filed at 03/08/2021 1700 Gross per 24 hour  Intake 200 ml  Output --  Net 200 ml        Physical Exam: Vital Signs Blood pressure 110/71, pulse 91, temperature 98.2 F (36.8 C), temperature source Oral, resp. rate 14, height $RemoveBe'5\' 7"'aAWKYjWEn$  (1.702 m), weight 117.7 kg, SpO2 98 %.    General: awake, alert, appropriate, laying in bed; NAD HENT: conjugate gaze; oropharynx moist CV: regular rate; no JVD Pulmonary: CTA B/L; no W/R/R- good air movement GI: soft, NT, ND, (+)BS Psychiatric: appropriate; slightly anxious but better than yesterday.  Neurological: Ox3 Skin: [name]- some bruising but staples intact; moderate swelling/edema; no dog ears; little dried blood on telfa pads/kerlex;  Musculoskeletal:     Cervical back: Normal range of motion. No rigidity.     Comments: UE strength 5/5 B/L  RLE- 5/5 LLE- 5-/5 in HF/KE/KF [name] with wound VAC in place TTP over L anterior shoulder c/w L bicipital tendinitis  Skin:    General: Skin is warm and dry.     Comments: [name] with wound VAC in place RUE PICC in place- looks OK   Neurological:     Mental Status: She is alert.     Comments: Patient is alert.  No acute distress.  Oriented x3 and follows commands. Decreased to light touch below knee on RLE CN's OK B/L   Psychiatric:        Behavior: Behavior normal.     Comments: Anxious, and eager to work with therapy    Assessment/Plan: 1. Functional deficits which require 3+ hours per day of interdisciplinary therapy in a comprehensive inpatient rehab setting. Physiatrist is providing close team supervision and 24 hour management of active medical problems listed below. Physiatrist and rehab team continue to assess barriers to discharge/monitor patient progress toward functional and medical goals  Care Tool:  Bathing    Body parts bathed by patient: Right arm, Left arm, Chest, Abdomen, Front perineal area, Right upper leg, Left upper leg, Face, Buttocks   Body parts bathed by helper: Right lower leg Body parts n/a: Left lower leg  Bathing assist Assist Level: Minimal Assistance - Patient > 75%     Upper Body Dressing/Undressing Upper body dressing   What is the patient wearing?: Pull over shirt    Upper body assist Assist Level: Set up assist    Lower Body Dressing/Undressing Lower body dressing      What is the patient wearing?: Pants     Lower body assist Assist for lower body dressing: Maximal Assistance - Patient 25 - 49%     Toileting Toileting Toileting Activity did not occur (Clothing management and hygiene only):  (on BSC)  Toileting assist Assist for toileting: Moderate Assistance - Patient 50 - 74%     Transfers Chair/bed transfer  Transfers assist     Chair/bed transfer assist level: Moderate Assistance - Patient 50 - 74%     Locomotion Ambulation   Ambulation assist   Ambulation activity did not occur: Safety/medical concerns          Walk 10 feet activity   Assist  Walk 10 feet activity did not occur: Safety/medical concerns        Walk 50 feet  activity   Assist Walk 50 feet with 2 turns activity did not occur: Safety/medical concerns         Walk 150 feet activity   Assist Walk 150 feet activity did not occur: Safety/medical concerns         Walk 10 feet on uneven surface  activity   Assist Walk 10 feet on uneven surfaces activity did not occur: Safety/medical concerns         Wheelchair     Assist Is the patient using a wheelchair?: Yes Type of Wheelchair: Manual    Wheelchair assist level: Supervision/Verbal cueing Max wheelchair distance: 150    Wheelchair 50 feet with 2 turns activity    Assist        Assist Level: Supervision/Verbal cueing   Wheelchair 150 feet activity     Assist      Assist Level: Supervision/Verbal cueing   Blood pressure 110/71, pulse 91, temperature 98.2 F (36.8 C), temperature source Oral, resp. rate 14, height $RemoveBe'5\' 7"'LhvaLraQu$  (1.702 m), weight 117.7 kg, SpO2 98 %.    Medical Problem List and Plan: 1. 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  2.  Impaired mobility: Continue Lovenox.  Venous Doppler is negative             -antiplatelet therapy: N/A 3. Pain from L4/5 discitis- Continue OxyContin sustained-release 10 mg every 12 hours,, Robaxin-750 milligram every 6 hours, Naprosyn 500 mg twice daily oxycodone as needed  11/29- pain manageable- con't regimen 4. Anxiety: Continue Celexa 40 mg daily, Ambien 5 mg nightly.  Provide emotional support  11/29- Anxiety doing better per pt- con't regimen             -antipsychotic agents: N/A 5. Neuropsych: This patient is capable of making decisions on her own behalf. 6. Skin/Wound Care: Routine  skin checks 7. Fluids/Electrolytes/Nutrition: Routine in and outs with follow-up chemistries 8.  ID/MSSA bacteremia.  Continue Ancef 2 g every 8 hours through 04/14/2021 and stop- per ID.  11/28- will make sure gets ESR/CRP, CMP and CBC with diff q Monday.  9.  Acute blood loss anemia.  Follow-up CBC 10.  Diabetes mellitus with peripheral neuropathy. A1c 13- had for 14 years;  NovoLog 8 units 3 times daily with meals, Levemir 55 units nightly, Tradjenta 5 mg daily, Glucophage 500 mg daily.  Check blood sugars before meals and at bedtime.  Diabetic teaching  11/29- CBGs 97-161- looking MUCH better- con't regimen and education.  11.  Hyperlipidemia.  Pravachol 12.  Obesity.  Dietary follow-up 13. L shoulder bicipital tendinitis- suggest starting Voltaren gel 2 G QID  11/29- cannot do steroid injection due to lack of overall control of CBGs/A1c 13.  14. [name]- went over risk of L hip and L knee contractures which will make prosthesis fitting more difficult- advised pt to lay on stomach or on side and stretch hip and knee/extend them multiple times per day.   11/28- went over again- explained if lays on stomach 2x/day for ~ 10 minutes, should work well.     LOS: 4 days A FACE TO FACE EVALUATION WAS PERFORMED  Lori Schmidt 03/09/2021, 8:45 AM

## 2021-03-09 NOTE — Progress Notes (Signed)
Occupational Therapy Session Note  Patient Details  Name: Lori Schmidt MRN: [id number] Date of Birth: [birth year]  Today's Date: 03/09/2021 OT Individual Time: 5009-3818 and 2993-7169 OT Individual Time Calculation (min): 54 min and 83 min   Short Term Goals: Week 1:  OT Short Term Goal 1 (Week 1): Pt will perform BSC/toilet transfer with CGA and LRAD OT Short Term Goal 2 (Week 1): Pt will perform LB dress in most appropriate environment CGA OT Short Term Goal 3 (Week 1): Pt will perform sit <> stands at LRAD consistently with CGA in prep for ADL OT Short Term Goal 4 (Week 1): Pt will don limb guard with Supervision   Skilled Therapeutic Interventions/Progress Updates:    Session 1: Pt greeted at time of session semireclined in bed with family members present who did not remain for session. Pt w/ c/o back pain and L shoulder pain. Provided STM at L shoulder upper trap area with some relief noted. Assisted pt with donning limb guard bed level. Pt needing to toilet, supine > sit Supervision and sit > stand at RW with Min/Mod and stand pivot to wheelchair same manner with RW. Stand pivot w/c > Bsc over toilet with Min A overall, needing Mod A for clothing management to fully don/doff over hips. Note pt unstable when standing and poor frustration tolerance, needing cues for pacing. Able to perform hygiene with both armrests dropped on BSC. Stand pivot toilet > wheelchair same manner. Note Cues throughout transfers for pivoting on LLE and promote hopping, able to do small hops during transfer. Self propel to sink and performed brief UB bathing with Setup assist. Nursing present during session and provided med pass including pain meds. Pt with pain in L shoulder and low back, which has been ongoing and medical team aware, discitis being treated. STM for low back and L shoulder musculature with some relief noted. Pt in bed at end of session alarm on call bell in reach.    Session 2: Pt greeted  at time of session semireclined in bed resting stating she felt "loopy" and "a little out of it" after getting pain meds, however no c/o pain. Checked vitals and all WNL. Pt agreeable to session and alertness improved, able to participate throughout entire session. Supine > sit Supervision and sit > stand Min A. Note pt stand pivots consistently with Min A throughout except Mod A x1 when pt forgot hand placement at end of session trying to grab RW instead of pushing up from w/c arm. Improved hopping on RLE and pivoting on foot, able to complete transfers more smoothly compared to previous sessions. Self propel room > main gym with Supervision and extended time, OT resuming for time and energy conservation. Transported to ADL apartment and demonstration potential use of TTB vs shower seat for mother's walk in shower. Note pt will ask mother for pictures for more accurate problem solving. Patient education focused on kitchen/IADL retraining from wheelchair level with Supervision for new environment, CGA for sit <> stands at counter top and cues for hand/foot placement. Stand pivot wheelchair <> mat with Min A, sit <> supine CGA. Focused on BUE shoulder ROM and strengthening with 2# dowel upgraded to 4# dowel for global strengthening in supine. Stand pivot  mat > wheelchair > bed same as above. Alarm on call bell in reach.    Therapy Documentation Precautions:  Precautions Precautions: Fall Precaution Comments: [name], anxiety Required Braces or Orthoses: Other Brace Other Brace: limb protector Restrictions Weight  Bearing Restrictions: Yes LLE Weight Bearing: Non weight bearing    Therapy/Group: Individual Therapy  Erasmo Score 03/09/2021, 7:24 AM

## 2021-03-09 NOTE — Progress Notes (Signed)
Patient ID: Lori Schmidt, female   DOB: [birth year], 45 y.o.   MRN: [id number]  Met with pt to discuss team conference goals of supervision-mod/I level and target discharge date of 12/13. She reports she hopes she is doing well and the therapy team feels this way. Discussed she is doing well and making progress and not to be too hard on herself. Discussed having her Mom come in for education closer to her discharge date. Pt reports she wants to come in tomorrow to observe which she is welcome to do this. Discussed working on getting home health started early due to pt's insurance is one which is difficult to find home health coverage. Will begin this process due to pt will have IV antibiotics at home at discharge.

## 2021-03-09 NOTE — Patient Care Conference (Addendum)
Inpatient RehabilitationTeam Conference and Plan of Care Update Date: 03/09/2021   Time: 11:46 AM    Patient Name: Lori Schmidt      Medical Record Number: [id number]  Date of Birth: 1975-08-30 Sex: Female         Room/Bed: 4W10C/4W10C-01 Payor Info: Payor: [employer]IG[employer] / Plan: CIGNA MANAGED / Product Type: *No Product type* /    Admit Date/Time:  03/05/2021  6:59 PM  Primary Diagnosis:  S/P BKA (below knee amputation) unilateral, left [facility])  Hospital Problems: Principal Problem:   S/P BKA (below knee amputation) unilateral, left (HCC) Active Problems:   Discitis of lumbar region   Left below-knee amputee [facility])    Expected Discharge Date: Expected Discharge Date: 03/23/21  Team Members Present: Physician leading conference: Dr. Genice Rouge Social Worker Present: Dossie Der, LCSW Nurse Present: Kennyth Arnold, RN PT Present: Bernie Covey, PT OT Present: Earleen Newport, OT PPS Coordinator present : Fae Pippin, SLP     Current Status/Progress Goal Weekly Team Focus  Bowel/Bladder   Continent of B/B. LBM 11/27  Remain continent  Toilet prn   Swallow/Nutrition/ Hydration             ADL's   Mod A stand pivots with RW, Min A squat pivot using grab bar in bathroom, Setup UB self care, Min A bathing at seated level using leans, Max A LB dressing, Mod A toileting  Supervision overall  Functional transfers, adaptive self care skills, psychosocial health and healing, dynamic balance, activity tolerance, UB strength   Mobility   mod A transfers, up to min STS in //bars, no gait yet  mod I w/c and short distance gait,  LE strength, dynamic balance, transfers, w/c mobility   Communication             Safety/Cognition/ Behavioral Observations            Pain   C/o pain to left BKA, PRN meds available  Pain Manageable  Assess Qshift and prn   Skin   MASD to buttocks, Left BKA  Promote healing and prevention of infection  Assess Qshift and prn, Daily dressing changes      Discharge Planning:  HOme with parents where their home is more accessible and they can provide supervision. Will need IV antibiotics at home will begin to arrange due to [employer] difficult insurance to work with   Team Discussion: Back pain is worse than leg pain. CBG's uncontrolled. IV abx until 04/14/2021. Going home with mother. On Neuropsychic schedule. Very anxious/nervous. Patient on target to meet rehab goals: yes, min assist stand pivot currently Supervision goals  *See Care Plan and progress notes for long and short-term goals.   Revisions to Treatment Plan:  Adjusting medications, changing Celexa to Paxil  Teaching Needs: Family education, medication/pain/anxiety management, skin/wound care, residual limb care, weight bearing precautions, safety awareness, transfer/gait training, etc.  Current Barriers to Discharge: Decreased caregiver support, Home enviroment access/layout, IV antibiotics, Wound care, Weight bearing restrictions, Medication compliance, and anxiety.  Possible Resolutions to Barriers: Family education Follow up [facility] for IV abx Order DME Follow up HH/Outpatient therapy Provide emotional support     Medical Summary Current Status: MSSA bacteremia; [name] and L4/5 diskitis- pain spikes intermittently- up to 9/10; continent; [name] looks OK- MASD -barrier cream  Barriers to Discharge: Behavior;Decreased family/caregiver support;Home enviroment access/layout;Wound care;Medical stability;IV antibiotics;Weight;Weight bearing restrictions;Other (comments)  Barriers to Discharge Comments: going to parent's house- on IV ABX until 04/14/21; A1c of 13! Possible Resolutions  to Barriers/Weekly Focus: min A-currently with OT; min-mod A with PT; anxiety is a big limiter as well as pain intermittently; overall controlled; goals supervision; on list for Neuropsych; L shoulder pain a little better; will d/w pt changing Celexa to Paxil- d/c- 12/13   Continued Need for Acute  Rehabilitation Level of Care: The patient requires daily medical management by a physician with specialized training in physical medicine and rehabilitation for the following reasons: Direction of a multidisciplinary physical rehabilitation program to maximize functional independence : Yes Medical management of patient stability for increased activity during participation in an intensive rehabilitation regime.: Yes Analysis of laboratory values and/or radiology reports with any subsequent need for medication adjustment and/or medical intervention. : Yes   I attest that I was present, lead the team conference, and concur with the assessment and plan of the team.   Tennis Must 03/09/2021, 4:54 PM

## 2021-03-10 LAB — GLUCOSE, CAPILLARY
Glucose-Capillary: 114 mg/dL — ABNORMAL HIGH (ref 70–99)
Glucose-Capillary: 102 mg/dL — ABNORMAL HIGH (ref 70–99)
Glucose-Capillary: 118 mg/dL — ABNORMAL HIGH (ref 70–99)
Glucose-Capillary: 147 mg/dL — ABNORMAL HIGH (ref 70–99)
Glucose-Capillary: 181 mg/dL — ABNORMAL HIGH (ref 70–99)

## 2021-03-10 MED ORDER — SODIUM CHLORIDE 0.9 % IV SOLN: INTRAVENOUS | Status: DC | PRN

## 2021-03-10 NOTE — Progress Notes (Signed)
Patient had an uneventful night. IV antibiotics therapy continued. Denies pain. Looking forward to therapy sessions and working her way out to transition to home environment. Reassured. Safety protocols adhered to.

## 2021-03-10 NOTE — Progress Notes (Signed)
Occupational Therapy Session Note  Patient Details  Name: Lori Schmidt MRN: [id number] Date of Birth: 1975/08/19  Today's Date: 03/10/2021 OT Individual Time: 1101-1155 and 1330-1425 OT Individual Time Calculation (min): 54 min and 55 min Missed 35 mins in PM session 2/2 pain   Short Term Goals: Week 1:  OT Short Term Goal 1 (Week 1): Pt will perform BSC/toilet transfer with CGA and LRAD OT Short Term Goal 2 (Week 1): Pt will perform LB dress in most appropriate environment CGA OT Short Term Goal 3 (Week 1): Pt will perform sit <> stands at LRAD consistently with CGA in prep for ADL OT Short Term Goal 4 (Week 1): Pt will don limb guard with Supervision   Skilled Therapeutic Interventions/Progress Updates:    Session 1: Pt greeted at time of session supine in bed with mother present who remained throughout session for family ed/training. Note chaplain present at beginning of session, did not remain and planned to come back tomorrow. Note pt's mother requesting to observe this session and perform hands on training in later session this afternoon. Pt stating pt has some back pain today but premedicated and able to participate throughout session. Supine > sit Supervision and sit > stand from bed Mod A initially but all other sit > stands throughout session with Min A. Pt performing stand pivot bed > wheelchair <> mat with Min A for power up and Min guard for transfer, all with RW. Pt self propelling approx 100 feet, and OT resuming 2/2 fatigue. Pt transport to ADL apartment and problem solve through walk in shower with pt and her mother. Will not be able to enter walk in shower and use shower seat, will need to problem solve and maybe be able to scoot on TTB. Mother to take pictures of bathroom for further problem solving. Stand pivot wheelchair <> mat Min A and focused on hopping with 2x5 hops to encourage RLE lift and build precursor skills for larger hopping during stand pivots.Transported  to room, alarm on call bell in reach.   Session 2: Pt greeted at time of session sitting up in wheelchair with mother present, who remained throughout session. Pt c/o back pain at beginning of session, # not provided, spoke with nursing who is aware and pain meds not due for approx another hour. Rewrapped ACE wrap on residual limb as noted to be falling off prior to session. Focus of session on ADL retraining and family ed, pt set up at sink for LB bathing per request, sit > stand with Min A at sink and able to doff clothing but needing to toilet at this time. Stand pivot wheelchair <> BSC over toilet with Min A, 3/3 toileting tasks with CGA, able to perform seated with drop arm BSC already down. Pt performing LB bathing on commode level. Back in wheelchair, pt in severe back pain and stating needing to lay down. Performed LB dressing from wheelchair level, threaded underwear and pants with Supervision, sit > stand Min A and therapist assisting to don over hips 2/2 pain. Stand pivot > bed Min A with RW. Sit > supine Supervision and positioned for comfort, provided ice packs for low back. Pt in intense pain, nursing aware, unable to continue session. Missed 35 mins of OT 2/2 pain.   Therapy Documentation Precautions:  Precautions Precautions: Fall Precaution Comments: [name], anxiety Required Braces or Orthoses: Other Brace Other Brace: limb protector Restrictions Weight Bearing Restrictions: Yes LLE Weight Bearing: Non weight bearing  Therapy/Group: Individual Therapy  Erasmo Score 03/10/2021, 7:22 AM

## 2021-03-10 NOTE — Progress Notes (Addendum)
PROGRESS NOTE   Subjective/Complaints:  Labs reviewed , pt without pain currently had some discomfort last noc , no breathing issues ,no bowel or bladder issues, discussed success with DM management and importance of compliance at home   ROS:   Pt denies SOB, abd pain, CP, N/V/C/D, and vision changes    Objective:   No results found. Recent Labs    03/08/21 0307  WBC 6.3  HGB 9.5*  HCT 29.0*  PLT 347    Recent Labs    03/08/21 0307  NA 136  K 3.5  CL 102  CO2 25  GLUCOSE 152*  BUN 20  CREATININE 0.45  CALCIUM 8.2*     Intake/Output Summary (Last 24 hours) at 03/10/2021 1207 Last data filed at 03/10/2021 0900 Gross per 24 hour  Intake 1080 ml  Output 0 ml  Net 1080 ml         Physical Exam: Vital Signs Blood pressure 116/73, pulse 89, temperature 98.3 F (36.8 C), resp. rate 15, height $RemoveBe'5\' 7"'PVfClgznZ$  (1.702 m), weight 117.7 kg, SpO2 97 %.   General: No acute distress Mood and affect are appropriate Heart: Regular rate and rhythm no rubs murmurs or extra sounds Lungs: Clear to auscultation, breathing unlabored, no rales or wheezes Abdomen: Positive bowel sounds, soft nontender to palpation, nondistended Extremities: No clubbing, cyanosis, or edema 03/10/2021    Skin: [name]- some bruising but staples intact; moderate swelling/edema; no dog ears; little dried blood on telfa pads/kerlex;  Musculoskeletal:     Cervical back: Normal range of motion. No rigidity.     Comments: UE strength 5/5 B/L  RLE- 5/5 LLE- 5-/5 in HF/KE/KF [name] with wound VAC in place TTP over L anterior shoulder c/w L bicipital tendinitis  Skin:    General: Skin is warm and dry.     Comments: [name] with wound VAC in place RUE PICC in place- looks OK  Neurological:     Mental Status: She is alert.     Comments: Patient is alert.  No acute distress.  Oriented x3 and follows commands. Decreased to light touch below knee on  RLE CN's OK B/L   Psychiatric:        Behavior: Behavior normal.     Comments: Anxious, and eager to work with therapy    Assessment/Plan: 1. Functional deficits which require 3+ hours per day of interdisciplinary therapy in a comprehensive inpatient rehab setting. Physiatrist is providing close team supervision and 24 hour management of active medical problems listed below. Physiatrist and rehab team continue to assess barriers to discharge/monitor patient progress toward functional and medical goals  Care Tool:  Bathing    Body parts bathed by patient: Right arm, Left arm, Chest, Abdomen, Front perineal area, Right upper leg, Left upper leg, Face, Buttocks   Body parts bathed by helper: Right lower leg Body parts n/a: Left lower leg   Bathing assist Assist Level: Minimal Assistance - Patient > 75%     Upper Body Dressing/Undressing Upper body dressing   What is the patient wearing?: Pull over shirt    Upper body assist Assist Level: Set up assist    Lower Body  Dressing/Undressing Lower body dressing      What is the patient wearing?: Pants     Lower body assist Assist for lower body dressing: Maximal Assistance - Patient 25 - 49%     Toileting Toileting Toileting Activity did not occur (Clothing management and hygiene only):  (on BSC)  Toileting assist Assist for toileting: Moderate Assistance - Patient 50 - 74%     Transfers Chair/bed transfer  Transfers assist     Chair/bed transfer assist level: Moderate Assistance - Patient 50 - 74%     Locomotion Ambulation   Ambulation assist   Ambulation activity did not occur: Safety/medical concerns          Walk 10 feet activity   Assist  Walk 10 feet activity did not occur: Safety/medical concerns        Walk 50 feet activity   Assist Walk 50 feet with 2 turns activity did not occur: Safety/medical concerns         Walk 150 feet activity   Assist Walk 150 feet activity did not occur:  Safety/medical concerns         Walk 10 feet on uneven surface  activity   Assist Walk 10 feet on uneven surfaces activity did not occur: Safety/medical concerns         Wheelchair     Assist Is the patient using a wheelchair?: Yes Type of Wheelchair: Manual    Wheelchair assist level: Supervision/Verbal cueing Max wheelchair distance: 150    Wheelchair 50 feet with 2 turns activity    Assist        Assist Level: Supervision/Verbal cueing   Wheelchair 150 feet activity     Assist      Assist Level: Supervision/Verbal cueing   Blood pressure 116/73, pulse 89, temperature 98.3 F (36.8 C), resp. rate 15, height $RemoveBe'5\' 7"'[id number]$  (1.702 m), weight 117.7 kg, SpO2 97 %.    Medical Problem List and Plan: 1. 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  Impaired mobility: Continue Lovenox.  Venous Doppler is negative             -antiplatelet therapy: N/A 3. Pain from L4/5 discitis- Continue OxyContin sustained-release 10 mg every 12 hours,, Robaxin-750 milligram every 6 hours, Naprosyn 500 mg twice daily oxycodone as needed  11/29- pain manageable- con't regimen 4. Anxiety: Continue Celexa 40 mg daily, Ambien 5 mg nightly.  Provide emotional support  11/29- Anxiety doing better per pt- con't regimen             -antipsychotic agents: N/A 5. Neuropsych: This patient is capable of making decisions on her own behalf. 6. Skin/Wound Care: Routine skin checks 7. Fluids/Electrolytes/Nutrition: Routine in and outs with follow-up chemistries 8.  ID/MSSA bacteremia.  Continue Ancef 2 g every 8 hours through 04/14/2021 and stop- per ID.   11/28- will make sure gets ESR/CRP,  CMP and CBC with diff q Monday.  9.  Acute blood loss anemia.  Follow-up CBC 10.  Diabetes mellitus with peripheral neuropathy. A1c 13- had for 14 years;  NovoLog 8 units 3 times daily with meals, Levemir 55 units  nightly, Tradjenta 5 mg daily, Glucophage 500 mg daily.  Check blood sugars before meals and at bedtime.  Diabetic teaching  CBG (last 3)  Recent Labs    03/10/21 0622 03/10/21 1006 03/10/21 1200  GLUCAP 114* 102* 118*  Well controlled   11.  Hyperlipidemia.  Pravachol 12.  Obesity.  Dietary follow-up 13. L shoulder bicipital tendinitis- suggest starting Voltaren gel 2 G QID  11/29- cannot do steroid injection due to lack of overall control of CBGs/A1c 13.  14. [name]- went over risk of L hip and L knee contractures which will make prosthesis fitting more difficult- advised pt to lay on stomach or on side and stretch hip and knee/extend them multiple times per day.   11/28- went over again- explained if lays on stomach 2x/day for ~ 10 minutes, should work well.     LOS: 5 days A FACE TO FACE EVALUATION WAS PERFORMED  Charlett Blake 03/10/2021, 12:07 PM

## 2021-03-10 NOTE — Progress Notes (Signed)
Physical Therapy Session Note  Patient Details  Name: Lori Schmidt MRN: [id number] Date of Birth: December 11, 1975  Today's Date: 03/10/2021 PT Individual Time: 0905-1000 PT Individual Time Calculation (min): 55 min   Short Term Goals: Week 1:  PT Short Term Goal 1 (Week 1): Pt will transfer to [facility] with CGA and LRAD PT Short Term Goal 2 (Week 1): Pt will ambulate 52ft with mod assist and RW PT Short Term Goal 3 (Week 1): Pt will propel WC x 142ft without assist  Skilled Therapeutic Interventions/Progress Updates: Pt presented in bed agreeable to therapy. Pt states increased pain this am, 8/10 consistently throughout session. Pt concerned because feels that she's not getting full extension in residual limb. Upon inspection of residual limb PTA noted that she is near full extension and may have possibly been lacking a few degrees prior to infection/amputation. Pt was able to don limb guard with minA for correct positioning. Pt changed shirt with set up. Pt performed Sit to stand with minA and required x 2 attempts to come to full stand. Pt then transported to ortho gym and participated in UBE L6 x 5 min backwards only for peri-scapular activation and general conditioning. Pt then propelled back to room for continued endurance. Pt performed Sit to stand with CGA from w/c and hop pivot transfer back to EOB. Pt indicated increased feeling of nausea and noted to be diaphoretic, per pt states had not felt this way after therapy. Notified nsg to do BS check. Pt left seated EOB with IV nsg present for PICC line management and Grey, LPN present.       Therapy Documentation Precautions:  Precautions Precautions: Fall Precaution Comments: [name], anxiety Required Braces or Orthoses: Other Brace Other Brace: limb protector Restrictions Weight Bearing Restrictions: Yes LLE Weight Bearing: Non weight bearing General:   Vital Signs:   Pain:   Mobility:   Locomotion :    Trunk/Postural  Assessment :    Balance:   Exercises:   Other Treatments:      Therapy/Group: Individual Therapy  Delayla Hoffmaster 03/10/2021, 4:17 PM

## 2021-03-11 LAB — GLUCOSE, CAPILLARY
Glucose-Capillary: 115 mg/dL — ABNORMAL HIGH (ref 70–99)
Glucose-Capillary: 102 mg/dL — ABNORMAL HIGH (ref 70–99)
Glucose-Capillary: 132 mg/dL — ABNORMAL HIGH (ref 70–99)
Glucose-Capillary: 130 mg/dL — ABNORMAL HIGH (ref 70–99)

## 2021-03-11 MED ORDER — PREGABALIN 50 MG PO CAPS
50.0000 mg | ORAL_CAPSULE | Freq: Three times a day (TID) | ORAL | Status: DC
  Administered 2021-03-11 – 2021-03-15 (×15): 50 mg via ORAL
  Filled 2021-03-11 (×16): qty 1

## 2021-03-11 NOTE — Progress Notes (Signed)
CH visited pt. as follow-up to prior visits; pt. says today has been "rough" as she had another back spasm yesterday that has left her in severe pain that makes rehab activities difficult.  Pt. says she feels better today but that last night she felt very overwhelmed and frustrated.  Pt. continues to verbalize a strong desire to participate in rehab and the pain she is experiencing is especially distressing to her because she sees it as slowing down her rehab process.  CH provided extended active listening and supportive presence; pt.'s sister Marcelino Duster also present at bedside.  CH prayed for pt. at end of visit and is working to connect pt. with resources for amputees from [place] in the spiritual care department.  Chaplains remain available as needed and will continue to follow.  Elpidio Anis, Chaplain Pager: [phone]

## 2021-03-11 NOTE — Progress Notes (Signed)
Occupational Therapy Session Note  Patient Details  Name: Lori Schmidt MRN: [id number] Date of Birth: January 16, 1976  Today's Date: 03/11/2021 OT Individual Time: 4431-5400 OT Individual Time Calculation (min): 57 min    Short Term Goals: Week 1:  OT Short Term Goal 1 (Week 1): Pt will perform BSC/toilet transfer with CGA and LRAD OT Short Term Goal 2 (Week 1): Pt will perform LB dress in most appropriate environment CGA OT Short Term Goal 3 (Week 1): Pt will perform sit <> stands at LRAD consistently with CGA in prep for ADL OT Short Term Goal 4 (Week 1): Pt will don limb guard with Supervision   Skilled Therapeutic Interventions/Progress Updates:    Pt greeted at time of session bed level noted to be in poor position and crowded environment on bed with sheets, pillows, etc. Pt also stating pain is "worse" than yesterday in low back and unsure if she will be able to participate today. Number not given. After nursing completed med pass, pt repositioned bed level for midline, hips repositioned and limb guard and pants removed with rolling L/R bed level. Pt noted to have relief with positional change and relief from limb guard. Re-wrapped ACE bandage on residual limb and no redness noted. Brief BLE ROM performed bed level and STM to low back, focusing on R lower aspect where muscle knot is palpable. Pt noting some relief. Bed mobility for supine > sit Min A 2/2 back pain, stand pivot bed > wheelchair  with Mod A for power up and Min/CGA for hopping pivot to wheelchair. Transported to bathroom and stand pivot wheelchair <> BSC over toilet with Min A. Assisted with clothing management 2/2 back pain. Pt able to complete hygiene with arms dropped on BSC. Set up at sink and UB bathing/grooming tasks with set up. Note pt did feel somewhat better, pain tolerable in low back after movement. Stand pivot back to bed with Min A and alarm on call bell in reach, sister present for visit as well.    Therapy  Documentation Precautions:  Precautions Precautions: Fall Precaution Comments: [name], anxiety Required Braces or Orthoses: Other Brace Other Brace: limb protector Restrictions Weight Bearing Restrictions: Yes LLE Weight Bearing: Non weight bearing    Therapy/Group: Individual Therapy  Erasmo Score 03/11/2021, 7:17 AM

## 2021-03-11 NOTE — Progress Notes (Signed)
PROGRESS NOTE   Subjective/Complaints:  Pt reports LBM yesterday.  Shooting/stabbing burning pain down LLE- not sure if due to [name] or back/sciatica.  Also having spasms- very upset that feels like going backwards.   L shoulder is not great, but she can work with it.      ROS:   Pt denies SOB, abd pain, CP, N/V/C/D, and vision changes   Objective:   No results found. No results for input(s): WBC, HGB, HCT, PLT in the last 72 hours. No results for input(s): NA, K, CL, CO2, GLUCOSE, BUN, CREATININE, CALCIUM in the last 72 hours.  Intake/Output Summary (Last 24 hours) at 03/11/2021 0813 Last data filed at 03/10/2021 1510 Gross per 24 hour  Intake 463.85 ml  Output --  Net 463.85 ml        Physical Exam: Vital Signs Blood pressure 119/76, pulse 84, temperature 98.3 F (36.8 C), temperature source Oral, resp. rate 16, height $RemoveBe'5\' 7"'[id number]$  (1.702 m), weight 115.5 kg, SpO2 100 %.    General: awake, alert, appropriate, laying in bed; more anxious; NAD HENT: conjugate gaze; oropharynx moist CV: regular rate; no JVD Pulmonary: CTA B/L; no W/R/R- good air movement GI: soft, NT, ND, (+)BS Psychiatric: appropriate- more anxious/upset about pain.  Neurological: Ox3 Skin: moderate swelling- C/D/I- limb guard in place 03/10/2021    Musculoskeletal:     Cervical back: Normal range of motion. No rigidity.     Comments: UE strength 5/5 B/L  RLE- 5/5 LLE- 5-/5 in HF/KE/KF [name] with wound VAC in place TTP over L anterior shoulder c/w L bicipital tendinitis - no change Skin:    General: Skin is warm and dry.     Comments: [name] with wound VAC in place RUE PICC in place- looks OK  Neurological:     Mental Status: She is alert.     Comments: Patient is alert.  No acute distress.  Oriented x3 and follows commands. Decreased to light touch below knee on RLE CN's OK B/L   Psychiatric:        Behavior: Behavior normal.      Comments: Anxious, and eager to work with therapy    Assessment/Plan: 1. Functional deficits which require 3+ hours per day of interdisciplinary therapy in a comprehensive inpatient rehab setting. Physiatrist is providing close team supervision and 24 hour management of active medical problems listed below. Physiatrist and rehab team continue to assess barriers to discharge/monitor patient progress toward functional and medical goals  Care Tool:  Bathing    Body parts bathed by patient: Right arm, Left arm, Chest, Abdomen, Front perineal area, Right upper leg, Left upper leg, Face, Buttocks   Body parts bathed by helper: Right lower leg Body parts n/a: Left lower leg   Bathing assist Assist Level: Minimal Assistance - Patient > 75%     Upper Body Dressing/Undressing Upper body dressing   What is the patient wearing?: Pull over shirt    Upper body assist Assist Level: Set up assist    Lower Body Dressing/Undressing Lower body dressing      What is the patient wearing?: Pants     Lower body assist Assist for  lower body dressing: Maximal Assistance - Patient 25 - 49%     Toileting Toileting Toileting Activity did not occur Landscape architect and hygiene only):  (on BSC)  Toileting assist Assist for toileting: Minimal Assistance - Patient > 75%     Transfers Chair/bed transfer  Transfers assist     Chair/bed transfer assist level: Moderate Assistance - Patient 50 - 74%     Locomotion Ambulation   Ambulation assist   Ambulation activity did not occur: Safety/medical concerns          Walk 10 feet activity   Assist  Walk 10 feet activity did not occur: Safety/medical concerns        Walk 50 feet activity   Assist Walk 50 feet with 2 turns activity did not occur: Safety/medical concerns         Walk 150 feet activity   Assist Walk 150 feet activity did not occur: Safety/medical concerns         Walk 10 feet on uneven surface   activity   Assist Walk 10 feet on uneven surfaces activity did not occur: Safety/medical concerns         Wheelchair     Assist Is the patient using a wheelchair?: Yes Type of Wheelchair: Manual    Wheelchair assist level: Supervision/Verbal cueing Max wheelchair distance: 150    Wheelchair 50 feet with 2 turns activity    Assist        Assist Level: Supervision/Verbal cueing   Wheelchair 150 feet activity     Assist      Assist Level: Supervision/Verbal cueing   Blood pressure 119/76, pulse 84, temperature 98.3 F (36.8 C), temperature source Oral, resp. rate 16, height $RemoveBe'5\' 7"'[id number]$  (1.702 m), weight 115.5 kg, SpO2 100 %.    Medical Problem List and Plan: 1. 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  Impaired mobility: Continue Lovenox.  Venous Doppler is negative             -antiplatelet therapy: N/A 3. Pain from L4/5 discitis- Continue OxyContin sustained-release 10 mg every 12 hours,, Robaxin-750 milligram every 6 hours, Naprosyn 500 mg twice daily oxycodone as needed  12/1- will add Lyrica 50 mg TID for nerve pain- con't rest of regimen- explained will take a few days to kick in.  4. Anxiety: Continue Celexa 40 mg daily, Ambien 5 mg nightly.  Provide emotional support  12/1- Anxiety worse due to increase in pain- con't regimen             -antipsychotic agents: N/A 5. Neuropsych: This patient is capable of making decisions on her own behalf. 6. Skin/Wound Care: Routine skin checks 7. Fluids/Electrolytes/Nutrition: Routine in and outs with follow-up  chemistries 8.  ID/MSSA bacteremia.  Continue Ancef 2 g every 8 hours through 04/14/2021 and stop- per ID.   11/28- will make sure gets ESR/CRP, CMP and CBC with diff q Monday.  9.  Acute blood loss anemia.  Follow-up CBC 10.  Diabetes mellitus with peripheral neuropathy.  A1c 13- had for 14 years;  NovoLog 8 units 3 times daily with meals, Levemir 55 units nightly, Tradjenta 5 mg daily, Glucophage 500 mg daily.  Check blood sugars before meals and at bedtime.  Diabetic teaching  CBG (last 3)  Recent Labs    03/10/21 1648 03/10/21 2058 03/11/21 0620  GLUCAP 147* 181* 115*   12/1- CBG's well controlled overall- con't regimen 11.  Hyperlipidemia.  Pravachol 12.  Obesity.  Dietary follow-up 13. L shoulder bicipital tendinitis- suggest starting Voltaren gel 2 G QID  11/29- cannot do steroid injection due to lack of overall control of CBGs/A1c 13.   12/1- is tolerable- con't voltaren and current pain regimen 14. [name]- went over risk of L hip and L knee contractures which will make prosthesis fitting more difficult- advised pt to lay on stomach or on side and stretch hip and knee/extend them multiple times per day.   11/28- went over again- explained if lays on stomach 2x/day for ~ 10 minutes, should work well.     LOS: 6 days A FACE TO FACE EVALUATION WAS PERFORMED  Britzy Graul 03/11/2021, 8:13 AM

## 2021-03-11 NOTE — Progress Notes (Signed)
Physical Therapy Session Note  Patient Details  Name: Lori Schmidt MRN: [id number] Date of Birth: 1975-08-22  Today's Date: 03/11/2021 PT Individual Time: 1057-1202, 1415-1530 PT Individual Time Calculation (min): 65 min, 75 min  Short Term Goals: Week 1:  PT Short Term Goal 1 (Week 1): Pt will transfer to [facility] with CGA and LRAD PT Short Term Goal 2 (Week 1): Pt will ambulate 38ft with mod assist and RW PT Short Term Goal 3 (Week 1): Pt will propel WC x 128ft without assist  Skilled Therapeutic Interventions/Progress Updates:    Session 1: pt received in bed and agreeable to therapy. Pt reports shooting pain in her back overnight and this morning. 6/10 pain, premedicated. Positioning and therapeutic movement provided throughout.  Session focused on bed level exercise d/t pain and for ROM.  Pt performed the following exercises to promote LE strength and endurance:  Prone press ups with some relief in back pain Prone knee flexion Prone hip extension Seated knee extension Squats to tap elevated bed  Pt remained seated EOB at end of session with her sister present, and was left with all needs in reach.   Session 2: Pt was asleep on arrival and easily roused, but reported groggy, lethargic feeling throughout session and demoed subdued affect compared to her usual mood. Pt reports receiving medication prior to session and falling asleep, affect likely related to new medication. MD notified. Despite lethargy, pt agreeable to PT session. Supine>sit from flat bed with supervision. Stand pivot transfer to w/c with min A to stand from elevated bed. Pt propelled w/c with BUE to therapy gym for endurance and functional mobility. Session focused on building strength with hops forward and back and straight up to clear foot. Pt able to clear foot on some trials, but not consistently. Tricep pushups on push up blocks to build UE strength for hop to gait with RW. Pt repeatedly ruminated on feeling  like she should be doing more with therapy. Provided encouragement and psychosocial support. Pt returned to bed after session in the same manner and was left with all needs in reach and alarm active.    Therapy Documentation Precautions:  Precautions Precautions: Fall Precaution Comments: [name], anxiety Required Braces or Orthoses: Other Brace Other Brace: limb protector Restrictions Weight Bearing Restrictions: Yes LLE Weight Bearing: Non weight bearing   Therapy/Group: Individual Therapy  Juluis Rainier 03/11/2021, 11:24 AM

## 2021-03-11 NOTE — Progress Notes (Signed)
Occupational Therapy Session Note  Patient Details  Name: Lori Schmidt MRN: [id number] Date of Birth: 10/16/1975  Today's Date: 03/12/2021 OT Individual Time: 1015-1100 OT Individual Time Calculation (min): 45 min    Short Term Goals: Week 1:  OT Short Term Goal 1 (Week 1): Pt will perform BSC/toilet transfer with CGA and LRAD OT Short Term Goal 2 (Week 1): Pt will perform LB dress in most appropriate environment CGA OT Short Term Goal 3 (Week 1): Pt will perform sit <> stands at LRAD consistently with CGA in prep for ADL OT Short Term Goal 4 (Week 1): Pt will don limb guard with Supervision  Skilled Therapeutic Interventions/Progress Updates:    Pt greeted in the w/c with no c/o pain, stated that she told family to come in at the incorrect time for family education. Told OT that they would come in for therapy this weekend to participate in family training. She asked to have her hair washed. Used the hair washing tray for this, pt doing her hair afterwards using the hairbrush with brightened affect. Reviewed residual limb wrapping in prep for shower. With instruction, pt able to complete simulated limb wrapping with Mod A to reach below residual limb. Since pts wound vac is off, she is looking forward to showering in the near future. She remained sitting up at close of session, all needs within reach.   Therapy Documentation Precautions:  Precautions Precautions: Fall Precaution Comments: [name], anxiety Required Braces or Orthoses: Other Brace Other Brace: limb protector Restrictions Weight Bearing Restrictions: No LLE Weight Bearing: Non weight bearing  Pain: a little in back, pt premedicated Pain Assessment Pain Scale: 0-10 Pain Score: 0-No pain ADL: ADL Eating: Not assessed Grooming: Setup Upper Body Bathing: Supervision/safety Lower Body Bathing: Minimal assistance Upper Body Dressing: Setup Lower Body Dressing: Maximal assistance Toileting: Not assessed Toilet  Transfer: Moderate assistance Toilet Transfer Method: Stand pivot   Therapy/Group: Individual Therapy  Lori Schmidt 03/12/2021, 4:09 PM

## 2021-03-12 LAB — GLUCOSE, CAPILLARY
Glucose-Capillary: 117 mg/dL — ABNORMAL HIGH (ref 70–99)
Glucose-Capillary: 101 mg/dL — ABNORMAL HIGH (ref 70–99)
Glucose-Capillary: 106 mg/dL — ABNORMAL HIGH (ref 70–99)
Glucose-Capillary: 116 mg/dL — ABNORMAL HIGH (ref 70–99)

## 2021-03-12 NOTE — Progress Notes (Signed)
PROGRESS NOTE   Subjective/Complaints:  Pt reports bowels still working OK- LBM 2 days ago, but needs to go already. feels MUCH better this AM- nerve pain is now tolerable/bearable with Lyrica- started to work immediately.     ROS:   Pt denies SOB, abd pain, CP, N/V/C/D, and vision changes    Objective:   No results found. No results for input(s): WBC, HGB, HCT, PLT in the last 72 hours. No results for input(s): NA, K, CL, CO2, GLUCOSE, BUN, CREATININE, CALCIUM in the last 72 hours.  Intake/Output Summary (Last 24 hours) at 03/12/2021 0835 Last data filed at 03/12/2021 0806 Gross per 24 hour  Intake 730 ml  Output --  Net 730 ml        Physical Exam: Vital Signs Blood pressure 115/75, pulse 88, temperature 98.3 F (36.8 C), resp. rate 15, height $RemoveBe'5\' 7"'hqeTRTeke$  (1.702 m), weight 115.5 kg, SpO2 98 %.      General: awake, alert, appropriate, sitting up in bed; finished breakfast; NAD HENT: conjugate gaze; oropharynx moist CV: regular rate; no JVD Pulmonary: CTA B/L; no W/R/R- good air movement GI: soft, NT, ND, (+)BS Psychiatric: appropriate- less anxious Neurological: Ox3; alert  03/10/2021    Musculoskeletal:     Cervical back: Normal range of motion. No rigidity.     Comments: UE strength 5/5 B/L  RLE- 5/5 LLE- 5-/5 in HF/KE/KF [name] with wound VAC in place TTP over L anterior shoulder c/w L bicipital tendinitis - no change Skin:    General: Skin is warm and dry.     Comments: [name] with wound VAC in place RUE PICC in place- looks OK  Neurological:     Mental Status: She is alert.     Comments: Patient is alert.  No acute distress.  Oriented x3 and follows commands. Decreased to light touch below knee on RLE CN's OK B/L   Psychiatric:        Behavior: Behavior normal.     Comments: Anxious, and eager to work with therapy    Assessment/Plan: 1. Functional deficits which require 3+ hours per day of  interdisciplinary therapy in a comprehensive inpatient rehab setting. Physiatrist is providing close team supervision and 24 hour management of active medical problems listed below. Physiatrist and rehab team continue to assess barriers to discharge/monitor patient progress toward functional and medical goals  Care Tool:  Bathing    Body parts bathed by patient: Right arm, Left arm, Chest, Abdomen, Front perineal area, Right upper leg, Left upper leg, Face, Buttocks   Body parts bathed by helper: Right lower leg Body parts n/a: Left lower leg   Bathing assist Assist Level: Minimal Assistance - Patient > 75%     Upper Body Dressing/Undressing Upper body dressing   What is the patient wearing?: Pull over shirt    Upper body assist Assist Level: Set up assist    Lower Body Dressing/Undressing Lower body dressing      What is the patient wearing?: Pants     Lower body assist Assist for lower body dressing: Maximal Assistance - Patient 25 - 49%     Toileting Toileting Toileting Activity did not occur (  Clothing management and hygiene only):  (on [facility])  Toileting assist Assist for toileting: Minimal Assistance - Patient > 75%     Transfers Chair/bed transfer  Transfers assist     Chair/bed transfer assist level: Moderate Assistance - Patient 50 - 74%     Locomotion Ambulation   Ambulation assist   Ambulation activity did not occur: Safety/medical concerns          Walk 10 feet activity   Assist  Walk 10 feet activity did not occur: Safety/medical concerns        Walk 50 feet activity   Assist Walk 50 feet with 2 turns activity did not occur: Safety/medical concerns         Walk 150 feet activity   Assist Walk 150 feet activity did not occur: Safety/medical concerns         Walk 10 feet on uneven surface  activity   Assist Walk 10 feet on uneven surfaces activity did not occur: Safety/medical concerns          Wheelchair     Assist Is the patient using a wheelchair?: Yes Type of Wheelchair: Manual    Wheelchair assist level: Supervision/Verbal cueing Max wheelchair distance: 150    Wheelchair 50 feet with 2 turns activity    Assist        Assist Level: Supervision/Verbal cueing   Wheelchair 150 feet activity     Assist      Assist Level: Supervision/Verbal cueing   Blood pressure 115/75, pulse 88, temperature 98.3 F (36.8 C), resp. rate 15, height $RemoveBe'5\' 7"'FitXCePpM$  (1.702 m), weight 115.5 kg, SpO2 98 %.    Medical Problem List and Plan: 1. 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  2.  Impaired mobility: Continue Lovenox.  Venous Doppler is negative             -antiplatelet therapy: N/A 3. Pain from L4/5 discitis- Continue OxyContin sustained-release 10 mg every 12 hours,, Robaxin-750 milligram every 6 hours, Naprosyn 500 mg twice daily oxycodone as needed    12/2- pt reports MUCH better- con't Lyrica 50 mg TID which was started 12/1.  4. Anxiety: Continue Celexa 40 mg daily, Ambien 5 mg nightly.  Provide emotional support  12/2- better today due to better pain control- is worried about cost of hospital- con't regimen             -antipsychotic agents: N/A 5. Neuropsych: This patient is capable of making decisions on her own behalf. 6. Skin/Wound Care: Routine skin checks 7. Fluids/Electrolytes/Nutrition: Routine in and outs with follow-up chemistries 8.  ID/MSSA bacteremia.  Continue Ancef 2 g every 8 hours through 04/14/2021 and stop- per ID.   11/28- will make sure gets ESR/CRP, CMP and CBC with diff q Monday.   12/2- labs qmonday 9.  Acute blood loss anemia.  Follow-up CBC 10.  Diabetes  mellitus with peripheral neuropathy. A1c 13- had for 14 years;  NovoLog 8 units 3 times daily with meals, Levemir 55 units nightly, Tradjenta 5 mg daily, Glucophage 500 mg daily.  Check blood sugars before meals and at bedtime.  Diabetic teaching  CBG (last 3)  Recent Labs  03/11/21 1621 03/11/21 2159 03/12/21 0640  GLUCAP 132* 130* 117*   12/2- CBGs controlled- also pt is comfortable with giving self insulin and checking her CBGs- con't regimen 11.  Hyperlipidemia.  Pravachol 12.  Obesity.  Dietary follow-up 13. L shoulder bicipital tendinitis- suggest starting Voltaren gel 2 G QID  11/29- cannot do steroid injection due to lack of overall control of CBGs/A1c 13.   12/1- is tolerable- con't voltaren and current pain regimen 14. [name]- went over risk of L hip and L knee contractures which will make prosthesis fitting more difficult- advised pt to lay on stomach or on side and stretch hip and knee/extend them multiple times per day.   11/28- went over again- explained if lays on stomach 2x/day for ~ 10 minutes, should work well.     LOS: 7 days A FACE TO FACE EVALUATION WAS PERFORMED  Tim Corriher 03/12/2021, 8:35 AM

## 2021-03-12 NOTE — Progress Notes (Signed)
Occupational Therapy Session Note  Patient Details  Name: Lori Schmidt MRN: [id number] Date of Birth: 07-06-1975  Today's Date: 03/12/2021 OT Individual Time: 2761-4709 OT Individual Time Calculation (min): 45 min    Short Term Goals: Week 1:  OT Short Term Goal 1 (Week 1): Pt will perform BSC/toilet transfer with CGA and LRAD OT Short Term Goal 2 (Week 1): Pt will perform LB dress in most appropriate environment CGA OT Short Term Goal 3 (Week 1): Pt will perform sit <> stands at LRAD consistently with CGA in prep for ADL OT Short Term Goal 4 (Week 1): Pt will don limb guard with Supervision  Skilled Therapeutic Interventions/Progress Updates:  Pt greeted supine in bed agreeable to OT intervention. Session focus on  BADL reeducation, functional transfers and increasing overall activity tolerance. Pt completed supine>sit with supervision, total A to don limb guard. Pt completed stand pivot transfer  ( heel/toe pivot) from EOB>w/c with MIN- MODA, increased assist to power up. Pt transported to bathroom for toileting, stand pivot transfer from w/c>BSC over toilet with RW, MIN- MODA to power up and pivot. Pt completed 3/3 toileting tasks with MIN A needing most assist for clothing mgmt. Pt completed stand pivot from [facility] over toilet>w/c with MIN A with Rw. Pt transported out of bathroom with total A where pt completed seated grooming tasks at sink with supervision. pt left seated in w/c with alarm belt activated and all needs within reach.                Therapy Documentation Precautions:  Precautions Precautions: Fall Precaution Comments: [name], anxiety Required Braces or Orthoses: Other Brace Other Brace: limb protector Restrictions Weight Bearing Restrictions: No LLE Weight Bearing: Non weight bearing  Pain: no pain reported during session     Therapy/Group: Individual Therapy  Barron Schmid 03/12/2021, 12:37 PM

## 2021-03-12 NOTE — Progress Notes (Signed)
Physical Therapy Session Note  Patient Details  Name: Lori Schmidt MRN: [id number] Date of Birth: January 30, 1976  Today's Date: 03/12/2021 PT Individual Time: 1100-1200 PT Individual Time Calculation (min): 60 min   Short Term Goals: Week 1:  PT Short Term Goal 1 (Week 1): Pt will transfer to [facility] with CGA and LRAD PT Short Term Goal 2 (Week 1): Pt will ambulate 76ft with mod assist and RW PT Short Term Goal 3 (Week 1): Pt will propel WC x 162ft without assist  Skilled Therapeutic Interventions/Progress Updates:    Pt seated in w/c on arrival and agreeable to therapy. Pt reports 5/10 back pain with activity, premedicated and requiring no other interventions at this time. Pt propelled w/c with BUE throughout session for functional mobility and strength. Pt performed Stand pivot transfer to mat table with min A and RW. Sit to stand to RW min A throughout. Session focused on pre gait and gait progression. Hops in place and forward and back. Progressed to gait x 12 ft, x25 ft, and x 15 ft with min A and w/c follow. Extended rest breaks with continued amputee education throughout. Pt returned to room and remained in w/c and was left with all needs in reach and alarm active.   Therapy Documentation Precautions:  Precautions Precautions: Fall Precaution Comments: [name], anxiety Required Braces or Orthoses: Other Brace Other Brace: limb protector Restrictions Weight Bearing Restrictions: No LLE Weight Bearing: Non weight bearing    Therapy/Group: Individual Therapy  Juluis Rainier 03/12/2021, 12:54 PM

## 2021-03-12 NOTE — Progress Notes (Signed)
Recreational Therapy  Note  Patient Details  Name: Nuri Branca MRN: [id number] Date of Birth: Sep 06, 1975 Today's Date: 03/12/2021   Pain: no c/o  Pt is not formally being followed by recreational therapy, however LRT arranged a peer support opportunity for 3 current rehab pts with amputations.  Pts discussed feelings of grief, current and future challenges, support team and potential coping strategies.  Pt very appreciative of this opportunity.  Rohit Deloria 03/12/2021, 12:57 PM

## 2021-03-12 NOTE — Progress Notes (Signed)
Occupational Therapy Session Note  Patient Details  Name: Lori Schmidt MRN: [id number] Date of Birth: 03/04/1976  Today's Date: 03/13/2021 OT Individual Time: 1430-1515 OT Individual Time Calculation (min): 45 min    Short Term Goals: Week 1:  OT Short Term Goal 1 (Week 1): Pt will perform BSC/toilet transfer with CGA and LRAD OT Short Term Goal 2 (Week 1): Pt will perform LB dress in most appropriate environment CGA OT Short Term Goal 3 (Week 1): Pt will perform sit <> stands at LRAD consistently with CGA in prep for ADL OT Short Term Goal 4 (Week 1): Pt will don limb guard with Supervision  Skilled Therapeutic Interventions/Progress Updates:    Pt greeted while sitting on the toilet, in care of RN. Pt able to complete perihygiene herself post BM while leaning laterally, vcs to lower arm of drop arm BSC in order to complete hygiene in the front thoroughly. Mod A for sit<stand using RW in order for OT to elevate LB clothing, Min A for stand pivot<w/c. While completing hand washing at the sink, discussed goals of family education. Pt wanted to review toilet transfers and also limb guard with mother and daughter. Pt practiced stand pivot<toilet using RW with family observing. Discussed with pt and family option of elevating height of BSC over toilet to increase ease of sit<stand at home, also to use a plastic bag to line bucket of BSC if she needed to use the drop arm BSC. Reviewed squat pivot technique, lateral leans, and B arm push up to help meet demands of toileting. Pts mother had hands on practice doffing/donning limb guard, discussed importance of adjusting straps prior to sit<stands so that the limb guard won't fall off during functional transfers. Also reviewed w/c parts mgt applying limb rest and leg rest to w/c. Pt remained sitting up at close of session, all needs within reach.   Therapy Documentation Precautions:  Precautions Precautions: Fall Precaution Comments: [name],  anxiety Required Braces or Orthoses: Other Brace Other Brace: limb protector Restrictions Weight Bearing Restrictions: No LLE Weight Bearing: Non weight bearing  Pain: in back, pt reported being premedicated, limited transfer training during family ed today to help with managing pain   ADL: ADL Eating: Not assessed Grooming: Setup Upper Body Bathing: Supervision/safety Lower Body Bathing: Minimal assistance Upper Body Dressing: Setup Lower Body Dressing: Maximal assistance Toileting: Not assessed Toilet Transfer: Moderate assistance Toilet Transfer Method: Stand pivot   Therapy/Group: Individual Therapy  Collie Kittel A Aspasia Rude 03/13/2021, 4:30 PM

## 2021-03-13 DIAGNOSIS — M4646 Discitis, unspecified, lumbar region: Secondary | ICD-10-CM

## 2021-03-13 DIAGNOSIS — E1142 Type 2 diabetes mellitus with diabetic polyneuropathy: Secondary | ICD-10-CM

## 2021-03-13 DIAGNOSIS — E46 Unspecified protein-calorie malnutrition: Secondary | ICD-10-CM

## 2021-03-13 DIAGNOSIS — E8809 Other disorders of plasma-protein metabolism, not elsewhere classified: Secondary | ICD-10-CM

## 2021-03-13 DIAGNOSIS — D62 Acute posthemorrhagic anemia: Secondary | ICD-10-CM

## 2021-03-13 LAB — GLUCOSE, CAPILLARY
Glucose-Capillary: 111 mg/dL — ABNORMAL HIGH (ref 70–99)
Glucose-Capillary: 131 mg/dL — ABNORMAL HIGH (ref 70–99)
Glucose-Capillary: 105 mg/dL — ABNORMAL HIGH (ref 70–99)
Glucose-Capillary: 90 mg/dL (ref 70–99)

## 2021-03-13 NOTE — Progress Notes (Signed)
Physical Therapy Session Note  Patient Details  Name: Lori Schmidt MRN: [id number] Date of Birth: 01-Sep-1975  Today's Date: 03/13/2021 PT Individual Time: 1751-0258 PT Individual Time Calculation (min): 45 min   Short Term Goals: Week 1:  PT Short Term Goal 1 (Week 1): Pt will transfer to [facility] with CGA and LRAD PT Short Term Goal 2 (Week 1): Pt will ambulate 23ft with mod assist and RW PT Short Term Goal 3 (Week 1): Pt will propel WC x 166ft without assist  Skilled Therapeutic Interventions/Progress Updates:  Pt was seen bedside in the am. Pt c/o 8/10 low back pain, pt received pain medicine prior to therapy and willing to try and participate. Pt transferred supine to edge of bed with c/g and verbal cues. Pt tolerated edge of bed about 5 minutes with S and verbal cues. Pt remained focus on pain throughout treatment session requiring redirection and encourage in regards to progress she has made since admission to rehab. Pt transferred sit to stand and stand pivot transfers with rolling walker and min A with verbal cues. Pt left sitting up in w/c with chair alarm in place and limb guard in place.   Therapy Documentation Precautions:  Precautions Precautions: Fall Precaution Comments: [name], anxiety Required Braces or Orthoses: Other Brace Other Brace: limb protector Restrictions Weight Bearing Restrictions: No LLE Weight Bearing: Non weight bearing General:  Pain: Pt c/o 8/10 low back pain, medicated prior to treatment.     Therapy/Group: Individual Therapy  Rayford Halsted 03/13/2021, 12:03 PM

## 2021-03-13 NOTE — Progress Notes (Signed)
Occupational Therapy Session Note  Patient Details  Name: Lasharn Bufkin MRN: [id number] Date of Birth: 08-21-1975  Today's Date: 03/14/2021 OT Group Time: 1100-1200 OT Group Time Calculation (min): 60 min   Skilled Therapeutic Interventions/Progress Updates:    Pt engaged in therapeutic w/c level dance group focusing on patient choice, UE/LE strengthening, salience, activity tolerance, and social participation. Pt was guided through various dance-based exercises involving UEs/LEs and trunk. All music was selected by group members. Emphasis placed on activity tolerance and UB strengthening. Pt exhibited high levels of participation at seated level, affect bright, and interacting with other members often. She was returned to the room by OT at close of session.    Therapy Documentation Precautions:  Precautions Precautions: Fall Precaution Comments: [name], anxiety Required Braces or Orthoses: Other Brace Other Brace: limb protector Restrictions Weight Bearing Restrictions: No LLE Weight Bearing: Non weight bearing  Pain: no s/s pain during tx   Therapy/Group: Group Therapy  Ruchel Brandenburger A Suriah Peragine 03/14/2021, 4:38 PM

## 2021-03-13 NOTE — Progress Notes (Signed)
PROGRESS NOTE   Subjective/Complaints: Patient seen laying in bed this morning.  She states she slept fairly overnight.  She is concerned about muscle spasms, however she does note they improved with medications.  ROS: Denies CP, SOB, N/V/D   Objective:   No results found. No results for input(s): WBC, HGB, HCT, PLT in the last 72 hours. No results for input(s): NA, K, CL, CO2, GLUCOSE, BUN, CREATININE, CALCIUM in the last 72 hours.  Intake/Output Summary (Last 24 hours) at 03/13/2021 1050 Last data filed at 03/13/2021 0804 Gross per 24 hour  Intake 1730 ml  Output --  Net 1730 ml         Physical Exam: Vital Signs Blood pressure 104/66, pulse 95, temperature 98.4 F (36.9 C), temperature source Oral, resp. rate 18, height _0  (1.702 m), weight 115.8 kg, SpO2 98 %. Constitutional: No distress . Vital signs reviewed. HENT: Normocephalic.  Atraumatic. Eyes: EOMI. No discharge. Cardiovascular: No JVD.  RRR. Respiratory: Normal effort.  No stridor.  Bilateral clear to auscultation. GI: Non-distended.  BS +. Skin: Warm and dry.  Left BKA with dressing CDI. Psych: Anxious.  Normal behavior. Musc: Left AKA with edema and tenderness Neuro: Alert Motor: Right lower extremity: 4+/5 proximal distal Left lower extremity: Hip flexion with some limitations due to pain   Assessment/Plan: 1. Functional deficits which require 3+ hours per day of interdisciplinary therapy in a comprehensive inpatient rehab setting. Physiatrist is providing close team supervision and 24 hour management of active medical problems listed below. Physiatrist and rehab team continue to assess barriers to discharge/monitor patient progress toward functional and medical goals  Care Tool:  Bathing    Body parts bathed by patient: Right arm, Left arm, Chest, Abdomen, Front perineal area, Right upper leg, Left upper leg, Face, Buttocks   Body parts  bathed by helper: Right lower leg Body parts n/a: Left lower leg   Bathing assist Assist Level: Minimal Assistance - Patient > 75%     Upper Body Dressing/Undressing Upper body dressing   What is the patient wearing?: Pull over shirt    Upper body assist Assist Level: Set up assist    Lower Body Dressing/Undressing Lower body dressing      What is the patient wearing?: Pants     Lower body assist Assist for lower body dressing: Maximal Assistance - Patient 25 - 49%     Toileting Toileting Toileting Activity did not occur (Clothing management and hygiene only):  (on BSC)  Toileting assist Assist for toileting: Minimal Assistance - Patient > 75%     Transfers Chair/bed transfer  Transfers assist     Chair/bed transfer assist level: Minimal Assistance - Patient > 75%     Locomotion Ambulation   Ambulation assist   Ambulation activity did not occur: Safety/medical concerns          Walk 10 feet activity   Assist  Walk 10 feet activity did not occur: Safety/medical concerns        Walk 50 feet activity   Assist Walk 50 feet with 2 turns activity did not occur: Safety/medical concerns         Walk 150 feet  activity   Assist Walk 150 feet activity did not occur: Safety/medical concerns         Walk 10 feet on uneven surface  activity   Assist Walk 10 feet on uneven surfaces activity did not occur: Safety/medical concerns         Wheelchair     Assist Is the patient using a wheelchair?: Yes Type of Wheelchair: Manual    Wheelchair assist level: Supervision/Verbal cueing Max wheelchair distance: 150    Wheelchair 50 feet with 2 turns activity    Assist        Assist Level: Supervision/Verbal cueing   Wheelchair 150 feet activity     Assist      Assist Level: Supervision/Verbal cueing   Blood pressure 104/66, pulse 95, temperature 98.4 F (36.9 C), temperature source Oral, resp. rate 18, height _0   (1.702 m), weight 115.8 kg, SpO2 98 %.    Medical Problem List and Plan: 1. 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  Impaired mobility: Continue Lovenox.  Venous Doppler is negative             -antiplatelet therapy: N/A 3. 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   Appointment most part on 12/3 when she takes her meds. 4. Anxiety: Continue Celexa 40 mg daily, Ambien 5 mg nightly.  Provide emotional support             -antipsychotic agents: N/A 5. Neuropsych: This patient is capable of making decisions on her own behalf. 6. Skin/Wound Care: Routine skin checks 7. Fluids/Electrolytes/Nutrition: Routine in and outs 8.  ID/MSSA bacteremia.  Continue Ancef 2 g every 8 hours through 04/14/2021 and stop- per ID.   11/28- will make sure gets ESR/CRP, CMP and CBC with diff q Monday 9.  Acute blood loss anemia.    Hemoglobin 9.5 on 11/28, labs ordered for Monday 10.  Diabetes mellitus with peripheral neuropathy. A1c 13- had for 14 years;  NovoLog 8 units 3 times daily with meals, Levemir 55 units nightly, Tradjenta 5 mg daily, Glucophage 500 mg daily.  Check blood sugars before meals and at bedtime.  Diabetic teaching  CBG (last 3)  Recent Labs    03/12/21 1638 03/12/21 2100 03/13/21 0605  GLUCAP 106* 116* 111*    Mildly elevated on 12/3 11.  Hyperlipidemia.  Pravachol 12.  Obesity.  Dietary follow-up 13. L shoulder bicipital tendinitis- suggest starting Voltaren gel 2 G QID  11/29- cannot do steroid injection due to lack of overall control of CBGs/A1c 13.   12/1- is tolerable- con't voltaren and current pain regimen 14. [name] Educated on avoiding contractures  18.   Hypoalbuminemia  Supplement initiated  LOS: 8 days A FACE TO FACE EVALUATION WAS PERFORMED  Caston Coopersmith Lorie Phenix 03/13/2021, 10:50 AM

## 2021-03-14 DIAGNOSIS — E785 Hyperlipidemia, unspecified: Secondary | ICD-10-CM

## 2021-03-14 LAB — GLUCOSE, CAPILLARY
Glucose-Capillary: 135 mg/dL — ABNORMAL HIGH (ref 70–99)
Glucose-Capillary: 114 mg/dL — ABNORMAL HIGH (ref 70–99)
Glucose-Capillary: 75 mg/dL (ref 70–99)
Glucose-Capillary: 125 mg/dL — ABNORMAL HIGH (ref 70–99)

## 2021-03-14 NOTE — Progress Notes (Signed)
Occupational Therapy Session Note  Patient Details  Name: Lori Schmidt MRN: [id number] Date of Birth: [birth year]  Today's Date: 03/14/2021 OT Individual Time: 7371-0626 OT Individual Time Calculation (min): 56 min    Short Term Goals: Week 1:  OT Short Term Goal 1 (Week 1): Pt will perform BSC/toilet transfer with CGA and LRAD OT Short Term Goal 2 (Week 1): Pt will perform LB dress in most appropriate environment CGA OT Short Term Goal 3 (Week 1): Pt will perform sit <> stands at LRAD consistently with CGA in prep for ADL OT Short Term Goal 4 (Week 1): Pt will don limb guard with Supervision   Skilled Therapeutic Interventions/Progress Updates:    Pt greeted at time of session up in wheelchair eager and ready to participate in OT session. Pt self propel room > Psychologist, counselling with Supervision, OT resuming to propel to [facility] elevator for fatigue and time conservation. Pt transported to [place] area/outside for therapeutic visit and uplifting mood 2/2 extended hospital stay and feeling down. Pt noted to have uplifted mood, stating outside environment was helpful. Pt self propelling throughout courtyard on varying surfaces and up/down slopes with extended time and supervision. Transported back to room same as above. Readjusted limb guard 2/2 beginning to fall off. Educated on fit and how to ensure position. Up in chair alarm on call bell in reach. Note pt states back pain was present throughout session but states premedicated and was able to still participate.    Therapy Documentation Precautions:  Precautions Precautions: Fall Precaution Comments: [name], anxiety Required Braces or Orthoses: Other Brace Other Brace: limb protector Restrictions Weight Bearing Restrictions: No LLE Weight Bearing: Non weight bearing    Therapy/Group: Individual Therapy  Erasmo Score 03/14/2021, 7:11 AM

## 2021-03-14 NOTE — Progress Notes (Signed)
PROGRESS NOTE   Subjective/Complaints: Patient seen laying in bed this morning.  She states she slept well overnight.  She denies pain.  She states she is looking very forward to seeing her dog today and is asking about transferring to chair prior to therapies.  ROS: Denies CP, SOB, N/V/D   Objective:   No results found. No results for input(s): WBC, HGB, HCT, PLT in the last 72 hours. No results for input(s): NA, K, CL, CO2, GLUCOSE, BUN, CREATININE, CALCIUM in the last 72 hours.  Intake/Output Summary (Last 24 hours) at 03/14/2021 1715 Last data filed at 03/14/2021 1306 Gross per 24 hour  Intake 730 ml  Output --  Net 730 ml         Physical Exam: Vital Signs Blood pressure 111/70, pulse 95, temperature 97.9 F (36.6 C), temperature source Oral, resp. rate 16, height $RemoveBe'5\' 7"'[id number]$  (1.702 m), weight 115.5 kg, SpO2 98 %. Constitutional: No distress . Vital signs reviewed. HENT: Normocephalic.  Atraumatic. Eyes: EOMI. No discharge. Cardiovascular: No JVD.  RRR. Respiratory: Normal effort.  No stridor.  Bilateral clear to auscultation. GI: Non-distended.  BS +. Skin: Warm and dry.  Left BKA CDI. Psych: Normal mood.  Normal behavior. Musc: Left BKA with edema and tenderness Neuro: Alert Motor: Right lower extremity: 4+/5 proximal distal, stable Left lower extremity: Hip flexion with some limitations due to pain   Assessment/Plan: 1. Functional deficits which require 3+ hours per day of interdisciplinary therapy in a comprehensive inpatient rehab setting. Physiatrist is providing close team supervision and 24 hour management of active medical problems listed below. Physiatrist and rehab team continue to assess barriers to discharge/monitor patient progress toward functional and medical goals  Care Tool:  Bathing    Body parts bathed by patient: Right arm, Left arm, Chest, Abdomen, Front perineal area, Right upper leg, Left  upper leg, Face, Buttocks   Body parts bathed by helper: Right lower leg Body parts n/a: Left lower leg   Bathing assist Assist Level: Minimal Assistance - Patient > 75%     Upper Body Dressing/Undressing Upper body dressing   What is the patient wearing?: Pull over shirt    Upper body assist Assist Level: Set up assist    Lower Body Dressing/Undressing Lower body dressing      What is the patient wearing?: Pants     Lower body assist Assist for lower body dressing: Maximal Assistance - Patient 25 - 49%     Toileting Toileting Toileting Activity did not occur (Clothing management and hygiene only):  (on BSC)  Toileting assist Assist for toileting: Minimal Assistance - Patient > 75%     Transfers Chair/bed transfer  Transfers assist     Chair/bed transfer assist level: Minimal Assistance - Patient > 75%     Locomotion Ambulation   Ambulation assist   Ambulation activity did not occur: Safety/medical concerns          Walk 10 feet activity   Assist  Walk 10 feet activity did not occur: Safety/medical concerns        Walk 50 feet activity   Assist Walk 50 feet with 2 turns activity did not occur: Safety/medical  concerns         Walk 150 feet activity   Assist Walk 150 feet activity did not occur: Safety/medical concerns         Walk 10 feet on uneven surface  activity   Assist Walk 10 feet on uneven surfaces activity did not occur: Safety/medical concerns         Wheelchair     Assist Is the patient using a wheelchair?: Yes Type of Wheelchair: Manual    Wheelchair assist level: Supervision/Verbal cueing Max wheelchair distance: 150    Wheelchair 50 feet with 2 turns activity    Assist        Assist Level: Supervision/Verbal cueing   Wheelchair 150 feet activity     Assist      Assist Level: Supervision/Verbal cueing   Blood pressure 111/70, pulse 95, temperature 97.9 F (36.6 C), temperature  source Oral, resp. rate 16, height $RemoveBe'5\' 7"'[id number]$  (1.702 m), weight 115.5 kg, SpO2 98 %.    Medical Problem List and Plan: 1. 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  Impaired mobility: Continue Lovenox.  Venous Doppler is negative             -antiplatelet therapy: N/A 3. 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   Controlled with meds on 4/4 4. Anxiety: Continue Celexa 40 mg daily, Ambien 5 mg nightly.  Provide emotional support             -antipsychotic agents: N/A 5. Neuropsych: This patient is capable of making decisions on her own behalf. 6. Skin/Wound Care: Routine skin checks 7. Fluids/Electrolytes/Nutrition: Routine in and outs 8.  ID/MSSA bacteremia.  Continue Ancef 2 g every 8 hours through 04/14/2021 and stop- per ID.   11/28- will make sure gets ESR/CRP, CMP and CBC with diff q Monday 9.  Acute blood loss anemia.    Hemoglobin 9.5 on 11/28, labs ordered for tomorrow 10.  Diabetes mellitus with peripheral neuropathy. A1c 13- had for 14 years;  NovoLog 8 units 3 times daily with meals, Levemir 55 units nightly, Tradjenta 5 mg daily, Glucophage 500 mg daily.  Check blood sugars before meals and at bedtime.  Diabetic teaching  CBG (last 3)  Recent Labs    03/14/21 0612 03/14/21 1211 03/14/21 1655  GLUCAP 135* 114* 75    Slightly labile on 12/4, monitor trend 11.  Hyperlipidemia.  Pravachol 12.  Obesity.  Dietary follow-up 13. L shoulder bicipital tendinitis- suggest starting Voltaren gel 2 G QID  11/29- cannot do steroid injection due to lack of overall control of CBGs/A1c 13.   12/1- is tolerable- con't voltaren and current pain regimen 14. [name] Educated on avoiding  contractures  18.  Hypoalbuminemia  Supplement initiated  LOS: 9 days A FACE TO FACE EVALUATION WAS PERFORMED  Catlin Aycock Lorie Phenix 03/14/2021, 5:15 PM

## 2021-03-15 LAB — GLUCOSE, CAPILLARY
Glucose-Capillary: 158 mg/dL — ABNORMAL HIGH (ref 70–99)
Glucose-Capillary: 93 mg/dL (ref 70–99)
Glucose-Capillary: 135 mg/dL — ABNORMAL HIGH (ref 70–99)
Glucose-Capillary: 88 mg/dL (ref 70–99)
Glucose-Capillary: 153 mg/dL — ABNORMAL HIGH (ref 70–99)
Glucose-Capillary: 104 mg/dL — ABNORMAL HIGH (ref 70–99)
Glucose-Capillary: 109 mg/dL — ABNORMAL HIGH (ref 70–99)
Glucose-Capillary: 150 mg/dL — ABNORMAL HIGH (ref 70–99)
Glucose-Capillary: 149 mg/dL — ABNORMAL HIGH (ref 70–99)
Glucose-Capillary: 102 mg/dL — ABNORMAL HIGH (ref 70–99)
Glucose-Capillary: 136 mg/dL — ABNORMAL HIGH (ref 70–99)
Glucose-Capillary: 117 mg/dL — ABNORMAL HIGH (ref 70–99)

## 2021-03-15 LAB — COMPREHENSIVE METABOLIC PANEL
ALT: 8 U/L (ref 0–44)
AST: 15 U/L (ref 15–41)
Albumin: 2.8 g/dL — ABNORMAL LOW (ref 3.5–5.0)
Alkaline Phosphatase: 80 U/L (ref 38–126)
Anion gap: 7 (ref 5–15)
BUN: 18 mg/dL (ref 6–20)
CO2: 27 mmol/L (ref 22–32)
Calcium: 8.5 mg/dL — ABNORMAL LOW (ref 8.9–10.3)
Chloride: 104 mmol/L (ref 98–111)
Creatinine, Ser: 0.38 mg/dL — ABNORMAL LOW (ref 0.44–1.00)
GFR, Estimated: >60 mL/min (ref >60)
Glucose, Bld: 117 mg/dL — ABNORMAL HIGH (ref 70–99)
Potassium: 3.7 mmol/L (ref 3.5–5.1)
Sodium: 138 mmol/L (ref 135–145)
Total Bilirubin: 0.5 mg/dL (ref 0.3–1.2)
Total Protein: 6.2 g/dL — ABNORMAL LOW (ref 6.5–8.1)

## 2021-03-15 LAB — CBC WITH DIFFERENTIAL/PLATELET
Abs Immature Granulocytes: 0.01 10*3/uL (ref 0.00–0.07)
Basophils Absolute: 0 10*3/uL (ref 0.0–0.1)
Basophils Relative: 0 %
Eosinophils Absolute: 1.2 10*3/uL — ABNORMAL HIGH (ref 0.0–0.5)
Eosinophils Relative: 22 %
HCT: 28.4 % — ABNORMAL LOW (ref 36.0–46.0)
Hemoglobin: 9.1 g/dL — ABNORMAL LOW (ref 12.0–15.0)
Immature Granulocytes: 0 %
Lymphocytes Relative: 23 %
Lymphs Abs: 1.3 10*3/uL (ref 0.7–4.0)
MCH: 27.8 pg (ref 26.0–34.0)
MCHC: 32 g/dL (ref 30.0–36.0)
MCV: 86.9 fL (ref 80.0–100.0)
Monocytes Absolute: 0.4 10*3/uL (ref 0.1–1.0)
Monocytes Relative: 6 %
Neutro Abs: 2.8 10*3/uL (ref 1.7–7.7)
Neutrophils Relative %: 49 %
Platelets: 258 10*3/uL (ref 150–400)
RBC: 3.27 MIL/uL — ABNORMAL LOW (ref 3.87–5.11)
RDW: 13.2 % (ref 11.5–15.5)
WBC: 5.7 10*3/uL (ref 4.0–10.5)
nRBC: 0 % (ref 0.0–0.2)

## 2021-03-15 LAB — SEDIMENTATION RATE: Sed Rate: 25 mm/hr — ABNORMAL HIGH (ref 0–22)

## 2021-03-15 LAB — C-REACTIVE PROTEIN: CRP: 0.9 mg/dL (ref <1.0)

## 2021-03-15 NOTE — Progress Notes (Signed)
Physical Therapy Weekly Progress Note  Patient Details  Name: Lori Schmidt MRN: [id number] Date of Birth: 1975/10/04  Beginning of progress report period: March 06, 2021 End of progress report period: March 15, 2021  Today's Date: 03/15/2021 PT Individual Time: 6226-3335 PT Individual Time Calculation (min): 57 min   Patient has met 3 of 3 short term goals.  Pt ambulating up to 25 ft with RW, largely limited by pack pain.   Patient continues to demonstrate the following deficits muscle weakness and decreased standing balance, decreased balance strategies, and difficulty maintaining precautions and therefore will continue to benefit from skilled PT intervention to increase functional independence with mobility.  Patient progressing toward long term goals..  Continue plan of care.  PT Short Term Goals Week 1:  PT Short Term Goal 1 (Week 1): Pt will transfer to [facility] with CGA and LRAD PT Short Term Goal 1 - Progress (Week 1): Met PT Short Term Goal 2 (Week 1): Pt will ambulate 70ft with mod assist and RW PT Short Term Goal 2 - Progress (Week 1): Met PT Short Term Goal 3 (Week 1): Pt will propel WC x 168ft without assist PT Short Term Goal 3 - Progress (Week 1): Met Week 2:  PT Short Term Goal 1 (Week 2): =LTGs d/t ELOS  Skilled Therapeutic Interventions/Progress Updates:  Pt seated in w/c on arrival and agreeable to therapy. Pt reports some back pain, premedicated. Pt's pain is worse with shrinker sock donned around ilium to assist with holding limb guard in place and eased with removal, suggesting possible SIJ dysfunction. Rest and positioning provided as needed.  Session focused on pain management, education, and strength. Pt performed 4 sets of standing hops to build LE strength and improve functional mobility. First Sit to stand with max A fading to min A with repetition. Session was limited by pt's pain, attempted stretching and manual therapy with no avail. Education  provided throughout session during rest breaks.   Pt remained in w/c after session and was left with all needs in reach and alarm active.   Therapy Documentation Precautions:  Precautions Precautions: Fall Precaution Comments: [name], anxiety Required Braces or Orthoses: Other Brace Other Brace: limb protector Restrictions Weight Bearing Restrictions: No LLE Weight Bearing: Non weight bearing   Therapy/Group: Individual Therapy  Mickel Fuchs 03/15/2021, 3:59 PM

## 2021-03-15 NOTE — Progress Notes (Signed)
Occupational Therapy Session Note  Patient Details  Name: Lori Schmidt MRN: [id number] Date of Birth: [birth year]  Today's Date: 03/15/2021 OT Individual Time: 9767-3419 and 3790-2409 OT Individual Time Calculation (min): 87 min and 28 mins   Short Term Goals: Week 1:  OT Short Term Goal 1 (Week 1): Pt will perform BSC/toilet transfer with CGA and LRAD OT Short Term Goal 2 (Week 1): Pt will perform LB dress in most appropriate environment CGA OT Short Term Goal 3 (Week 1): Pt will perform sit <> stands at LRAD consistently with CGA in prep for ADL OT Short Term Goal 4 (Week 1): Pt will don limb guard with Supervision   Skilled Therapeutic Interventions/Progress Updates:    Session 1: Pt greeted at time of session supine in bed resting stating that she has back pain today that is tolerable, no # provided, nurse performing med pass at beginning of session including pain meds. Pt positioned in sidelying while RN prepared meds for OT to perform STM for low back with some relief noted. Bed mobility supine > sit Supervision and stand pivot bed > wheelchair <> BSC over toilet with Min A overall with RW. In standing to doff clothing, pt did have one LOB with Min/Mod A to correct. 3/3 toileting tasks with Min A. Doffed pants and underwear, donned new underwear and pants with Min/Mod A. Pt performing LB bathing seated on commode with Supervision seated with lateral leans for buttocks and periarea. Problem solving with the pt to don limb guard prior to pants to assist keeping guard from falling off. Sit > stand to don over hips prior to transfer back to wheelchair. Self propel > sink and UB bathe/dress with Set up. Assitsed with hair washing at sink level per pt request, pt performing grooming tasks. Set up with alarm on call bell in reach.    Session 2: Pt greeted at time of session sitting up in wheelchair with mother present who remained for session, back pain present which has been ongoing and  unchanged from AM session. Did not rate pain but able to tolerate OT session and participate. Self propel room <> day room Supervision with extended time and focused on assessing L shoulder pain. Pt has pain with shoulder flexion/abduction with AROM but not with PROM. Performed 1x10 towel rolls, windshield wipers, and towel holds for flexion/extension. Education about proper body mechanics to prevent overuse injuries from wheelchair and RW use. Back in room alarm on call bell in reach.  Therapy Documentation Precautions:  Precautions Precautions: Fall Precaution Comments: [name], anxiety Required Braces or Orthoses: Other Brace Other Brace: limb protector Restrictions Weight Bearing Restrictions: No LLE Weight Bearing: Non weight bearing    Therapy/Group: Individual Therapy  Erasmo Score 03/15/2021, 7:13 AM

## 2021-03-15 NOTE — Progress Notes (Signed)
Occupational Therapy Weekly Progress Note  Patient Details  Name: Shenay Torti MRN: [id number] Date of Birth: [birth year]  Beginning of progress report period: March 06, 2021 End of progress report period: March 15, 2021    Patient has met 1 of 4 short term goals.  Pt is performing stand pivot transfers to various surfaces including bed , BSC over toilet, wheelchair, and therapy mat with Min A for power up and using hopping method at RW. Note pt has improved ability to clear R foot for hopping but is still using gripper sock, unable to use tennis shoe yet. Pt is Min A for LB bathing and dressing, Min A for toileting tasks. Note pt's back pain has been limiting, medical team is aware and pt is receiving pain medications prior to therapy sessions. Family training/education has been initiated with mother x2 sessions and is ongoing.   Patient continues to demonstrate the following deficits: muscle weakness, decreased cardiorespiratoy endurance, impaired timing and sequencing, decreased coordination, and decreased motor planning, and decreased sitting balance, decreased standing balance, decreased postural control, and decreased balance strategies and therefore will continue to benefit from skilled OT intervention to enhance overall performance with BADL, iADL, and Reduce care partner burden.  Patient progressing toward long term goals..  Continue plan of care.  OT Short Term Goals Week 1:  OT Short Term Goal 1 (Week 1): Pt will perform BSC/toilet transfer with CGA and LRAD OT Short Term Goal 1 - Progress (Week 1): Progressing toward goal OT Short Term Goal 2 (Week 1): Pt will perform LB dress in most appropriate environment CGA OT Short Term Goal 2 - Progress (Week 1): Progressing toward goal OT Short Term Goal 3 (Week 1): Pt will perform sit <> stands at LRAD consistently with CGA in prep for ADL OT Short Term Goal 3 - Progress (Week 1): Progressing toward goal OT Short Term Goal 4  (Week 1): Pt will don limb guard with Supervision OT Short Term Goal 4 - Progress (Week 1): Met Week 2:  OT Short Term Goal 1 (Week 2): STGs = LTGs 2/2 ELOS    Therapy Documentation Precautions:  Precautions Precautions: Fall Precaution Comments: [name], anxiety Required Braces or Orthoses: Other Brace Other Brace: limb protector Restrictions Weight Bearing Restrictions: No LLE Weight Bearing: Non weight bearing      Therapy/Group: Individual Therapy  [name] 03/15/2021, 7:14 AM

## 2021-03-15 NOTE — Progress Notes (Signed)
PROGRESS NOTE   Subjective/Complaints:   No issues overnite, discussed monitoring process for discitis, serial CRP, ESR, CBC Discussed IV abx date as well as ID f/u with pt and mom.  ROS: Denies CP, SOB, N/V/D   Objective:   No results found. Recent Labs    03/15/21 0336  WBC 5.7  HGB 9.1*  HCT 28.4*  PLT 258   Recent Labs    03/15/21 0336  NA 138  K 3.7  CL 104  CO2 27  GLUCOSE 117*  BUN 18  CREATININE 0.38*  CALCIUM 8.5*    Intake/Output Summary (Last 24 hours) at 03/15/2021 1007 Last data filed at 03/15/2021 0700 Gross per 24 hour  Intake 540 ml  Output --  Net 540 ml         Physical Exam: Vital Signs Blood pressure 108/64, pulse 94, temperature 98.4 F (36.9 C), temperature source Oral, resp. rate 18, height _0  (1.702 m), weight 116.9 kg, SpO2 97 %.  General: No acute distress Mood and affect are appropriate Heart: Regular rate and rhythm no rubs murmurs or extra sounds Lungs: Clear to auscultation, breathing unlabored, no rales or wheezes Abdomen: Positive bowel sounds, soft nontender to palpation, nondistended Extremities: No clubbing, cyanosis, or edema Skin: No evidence of breakdown, no evidence of rash   Musc: Left BKA with edema and tenderness Neuro: Alert Motor: Right lower extremity: 4+/5 proximal distal, stable Left lower extremity: Hip flexion with some limitations due to pain   Assessment/Plan: 1. Functional deficits which require 3+ hours per day of interdisciplinary therapy in a comprehensive inpatient rehab setting. Physiatrist is providing close team supervision and 24 hour management of active medical problems listed below. Physiatrist and rehab team continue to assess barriers to discharge/monitor patient progress toward functional and medical goals  Care Tool:  Bathing    Body parts bathed by patient: Right arm, Left arm, Chest, Abdomen, Front perineal area, Right  upper leg, Left upper leg, Face, Buttocks   Body parts bathed by helper: Right lower leg Body parts n/a: Left lower leg   Bathing assist Assist Level: Minimal Assistance - Patient > 75%     Upper Body Dressing/Undressing Upper body dressing   What is the patient wearing?: Pull over shirt    Upper body assist Assist Level: Set up assist    Lower Body Dressing/Undressing Lower body dressing      What is the patient wearing?: Pants     Lower body assist Assist for lower body dressing: Moderate Assistance - Patient 50 - 74%     Toileting Toileting Toileting Activity did not occur Landscape architect and hygiene only):  (on BSC)  Toileting assist Assist for toileting: Minimal Assistance - Patient > 75%     Transfers Chair/bed transfer  Transfers assist     Chair/bed transfer assist level: Minimal Assistance - Patient > 75%     Locomotion Ambulation   Ambulation assist   Ambulation activity did not occur: Safety/medical concerns          Walk 10 feet activity   Assist  Walk 10 feet activity did not occur: Safety/medical concerns        Walk  50 feet activity   Assist Walk 50 feet with 2 turns activity did not occur: Safety/medical concerns         Walk 150 feet activity   Assist Walk 150 feet activity did not occur: Safety/medical concerns         Walk 10 feet on uneven surface  activity   Assist Walk 10 feet on uneven surfaces activity did not occur: Safety/medical concerns         Wheelchair     Assist Is the patient using a wheelchair?: Yes Type of Wheelchair: Manual    Wheelchair assist level: Supervision/Verbal cueing Max wheelchair distance: 150    Wheelchair 50 feet with 2 turns activity    Assist        Assist Level: Supervision/Verbal cueing   Wheelchair 150 feet activity     Assist      Assist Level: Supervision/Verbal cueing   Blood pressure 108/64, pulse 94, temperature 98.4 F (36.9 C),  temperature source Oral, resp. rate 18, height _0  (1.702 m), weight 116.9 kg, SpO2 97 %.    Medical Problem List and Plan: 1. 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  Impaired mobility: Continue Lovenox.  Venous Doppler is negative             -antiplatelet therapy: N/A 3. 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   Controlled with meds on 12/5 4. Anxiety: Continue Celexa 40 mg daily, Ambien 5 mg nightly.  Provide emotional support             -antipsychotic agents: N/A 5. Neuropsych: This patient is capable of making decisions on her own behalf. 6. Skin/Wound Care: Routine skin checks 7. Fluids/Electrolytes/Nutrition: Routine in and outs 8.  ID/MSSA bacteremia.  Continue Ancef 2 g every 8 hours through 04/14/2021 and stop- per ID.   Normal  ESR/CRP, WBCs and diff Monday 9.  Acute blood loss anemia.    Hemoglobin 9.5 on 11/28, labs ordered for tomorrow 10.  Diabetes mellitus with peripheral neuropathy. A1c 13- had for 14 years;  NovoLog 8 units 3 times daily with meals, Levemir 55 units nightly, Tradjenta 5 mg daily, Glucophage 500 mg daily.  Check blood sugars before meals and at bedtime.  Diabetic teaching  CBG (last 3)  Recent Labs    03/14/21 1655 03/14/21 2054 03/15/21 0559  GLUCAP 75 125* 149*    Controlled  11.  Hyperlipidemia.  Pravachol 12.  Obesity.  Dietary follow-up 13. L shoulder bicipital tendinitis- suggest starting Voltaren gel 2 G QID  11/29- cannot do steroid injection due to lack of overall control of CBGs/A1c 13.   12/1- is tolerable- con't voltaren and current pain regimen 14. [name] Educated on avoiding contractures  18.   Hypoalbuminemia  Supplement initiated  LOS: 10 days A FACE TO FACE EVALUATION WAS PERFORMED  Charlett Blake 03/15/2021, 10:07 AM

## 2021-03-15 NOTE — Progress Notes (Signed)
Physical Therapy Session Note  Patient Details  Name: Lori Schmidt MRN: [id number] Date of Birth: 1976/04/03  Today's Date: 03/15/2021 PT Individual Time: 1300-1415 PT Individual Time Calculation (min): 75 min   Short Term Goals: Week 2:     Skilled Therapeutic Interventions/Progress Updates: Pt presents sitting in w/c and agreeable to therapy.  Pt assisted w/ donning limb protector, but continues to fall down.  Pt educated on w/c mobility and able to negotiate out of room and into hallway > 150' before requiring rest break.  Pt negotiated w/c up and down ramp w/ min A for safety.  Pt performed sit to stand transfers w/ min A to CGA.  Pt performed step-pivot w/ RW to car w/ min A and verbal cues for use of UEs to clear R LE .  PT assisted doffing limb protector to transfer into car w/ min  to CGA. Pt performing all w/c management activities w/ occasional min A for placing limb extender and for ensuring locking of leg/limb rests.  Pt performed UBE at level 1.5 x 10' w/o trunk support.  Pt wheeled self > 150' back to room and then negotiated in confined space w/ occasional verbal cues.  Chair alarm on and all needs in reach, mother present during entire session.     Therapy Documentation Precautions:  Precautions Precautions: Fall Precaution Comments: [name], anxiety Required Braces or Orthoses: Other Brace Other Brace: limb protector Restrictions Weight Bearing Restrictions: No LLE Weight Bearing: Non weight bearing General:   Vital Signs: Therapy Vitals Temp: 97.9 F (36.6 C) Temp Source: Oral Pulse Rate: (!) 101 Resp: 17 BP: 115/80 Patient Position (if appropriate): Sitting Oxygen Therapy SpO2: 99 % O2 Device: Room Air Pain:0/10 w/ pain meds.       Therapy/Group: Individual Therapy  Lucio Edward 03/15/2021, 2:32 PM

## 2021-03-16 LAB — GLUCOSE, CAPILLARY
Glucose-Capillary: 162 mg/dL — ABNORMAL HIGH (ref 70–99)
Glucose-Capillary: 91 mg/dL (ref 70–99)
Glucose-Capillary: 114 mg/dL — ABNORMAL HIGH (ref 70–99)
Glucose-Capillary: 116 mg/dL — ABNORMAL HIGH (ref 70–99)

## 2021-03-16 MED ORDER — PREGABALIN 75 MG PO CAPS
75.0000 mg | ORAL_CAPSULE | Freq: Three times a day (TID) | ORAL | Status: DC
  Administered 2021-03-16 – 2021-03-20 (×13): 75 mg via ORAL
  Filled 2021-03-16 (×13): qty 1

## 2021-03-16 NOTE — Progress Notes (Addendum)
RN and CN assisted patient back to bed she was using her walker stand and pivot from the wheelchair when she was near the bed she had a spasm on her back and almost fell but did not .RN and CN helped her back to bed. She claims this is the first time she had a very bad spasm.

## 2021-03-16 NOTE — Progress Notes (Signed)
Occupational Therapy Session Note  Patient Details  Name: Lori Schmidt MRN: [id number] Date of Birth: 04-Aug-1975  Today's Date: 03/16/2021 OT Individual Time: 0240-9735 OT Individual Time Calculation (min): 72 min    Short Term Goals: Week 1:  OT Short Term Goal 1 (Week 1): Pt will perform BSC/toilet transfer with CGA and LRAD OT Short Term Goal 1 - Progress (Week 1): Progressing toward goal OT Short Term Goal 2 (Week 1): Pt will perform LB dress in most appropriate environment CGA OT Short Term Goal 2 - Progress (Week 1): Progressing toward goal OT Short Term Goal 3 (Week 1): Pt will perform sit <> stands at LRAD consistently with CGA in prep for ADL OT Short Term Goal 3 - Progress (Week 1): Progressing toward goal OT Short Term Goal 4 (Week 1): Pt will don limb guard with Supervision OT Short Term Goal 4 - Progress (Week 1): Met Week 2:  OT Short Term Goal 1 (Week 2): STGs = LTGs 2/2 ELOS   Skilled Therapeutic Interventions/Progress Updates:    Pt greeted at time of session up in chair with mother present who remained throughout session, pt with low back pain which was doing "okay" this session 2/2 pain meds. RN providing pain meds at beginning of session as well. Mother provided filled out measurement sheet, note smallest doorway is 30" wide and measured pt chair at 28", pt should be able to fit through all doorways in home if at wheelchair level at time of DC. Pt self propelled to ADL apartment and discussed options for home shower set up. Pt's mother's home has a walk in shower with a low lip and build in shower bench that does not come out full width to edge of shower, after setting up simulated environment, felt like this would be unsafe to attempt transfer at this time. Switched to attempting stand pivot to bed for realistic practice and pt needing x3 attempts for sit > stand, eventually needing heavy Mod A. Pt stating she is feeling fatigued after all the therapy today and  this is why needing so much help. Did not attempt transfer 2/2 fatigue. Switched to kitchen IADL retraining and pt with good recall from previous sessions for accessing fridge from wheelchair level and "microwave" simulated for reheating items. Transported back to room and alarm on call bell in reach.    Therapy Documentation Precautions:  Precautions Precautions: Fall Precaution Comments: [name], anxiety Required Braces or Orthoses: Other Brace Other Brace: limb protector Restrictions Weight Bearing Restrictions: No LLE Weight Bearing: Non weight bearing    Therapy/Group: Individual Therapy  [name] 03/16/2021, 7:29 AM

## 2021-03-16 NOTE — Progress Notes (Signed)
PROGRESS NOTE   Subjective/Complaints:  Pt reports back is hurting more - due to muscle spasms- had 1 last night- didn't fall but said almost did due to spasms.  Scares her.  LBM yesterday.  Limb guard so heavy- trying to try other options to protect [name].     ROS:  Pt denies SOB, abd pain, CP, N/V/C/D, and vision changes    Objective:   No results found. Recent Labs    03/15/21 0336  WBC 5.7  HGB 9.1*  HCT 28.4*  PLT 258   Recent Labs    03/15/21 0336  NA 138  K 3.7  CL 104  CO2 27  GLUCOSE 117*  BUN 18  CREATININE 0.38*  CALCIUM 8.5*    Intake/Output Summary (Last 24 hours) at 03/16/2021 0800 Last data filed at 03/15/2021 1300 Gross per 24 hour  Intake 240 ml  Output --  Net 240 ml        Physical Exam: Vital Signs Blood pressure 112/69, pulse 98, temperature 98.2 F (36.8 C), resp. rate 18, height $RemoveBe'5\' 7"'MUUaSPHwd$  (1.702 m), weight 111.5 kg, SpO2 98 %.   General: awake, alert, appropriate, sitting up in bed; NAD HENT: conjugate gaze; oropharynx moist CV: regular rate; no JVD Pulmonary: CTA B/L; no W/R/R- good air movement GI: soft, NT, ND, (+)BS Psychiatric: appropriate Neurological: Ox3 Musc: Left BKA with edema and tenderness- with shrinker in place- C/D/I Neuro: Alert Motor: Right lower extremity: 4+/5 proximal distal, stable Left lower extremity: Hip flexion with some limitations due to pain   Assessment/Plan: 1. Functional deficits which require 3+ hours per day of interdisciplinary therapy in a comprehensive inpatient rehab setting. Physiatrist is providing close team supervision and 24 hour management of active medical problems listed below. Physiatrist and rehab team continue to assess barriers to discharge/monitor patient progress toward functional and medical goals  Care Tool:  Bathing    Body parts bathed by patient: Right arm, Left arm, Chest, Abdomen, Front perineal area, Right  upper leg, Left upper leg, Face, Buttocks   Body parts bathed by helper: Right lower leg Body parts n/a: Left lower leg   Bathing assist Assist Level: Minimal Assistance - Patient > 75%     Upper Body Dressing/Undressing Upper body dressing   What is the patient wearing?: Pull over shirt    Upper body assist Assist Level: Set up assist    Lower Body Dressing/Undressing Lower body dressing      What is the patient wearing?: Pants     Lower body assist Assist for lower body dressing: Moderate Assistance - Patient 50 - 74%     Toileting Toileting Toileting Activity did not occur Landscape architect and hygiene only):  (on BSC)  Toileting assist Assist for toileting: Minimal Assistance - Patient > 75%     Transfers Chair/bed transfer  Transfers assist     Chair/bed transfer assist level: Minimal Assistance - Patient > 75%     Locomotion Ambulation   Ambulation assist   Ambulation activity did not occur: Safety/medical concerns  Assist level: Minimal Assistance - Patient > 75% Assistive device: Walker-rolling Max distance: 25 ft   Walk 10 feet activity  Assist  Walk 10 feet activity did not occur: Safety/medical concerns  Assist level: Minimal Assistance - Patient > 75% Assistive device: Walker-rolling   Walk 50 feet activity   Assist Walk 50 feet with 2 turns activity did not occur: Safety/medical concerns         Walk 150 feet activity   Assist Walk 150 feet activity did not occur: Safety/medical concerns         Walk 10 feet on uneven surface  activity   Assist Walk 10 feet on uneven surfaces activity did not occur: Safety/medical concerns         Wheelchair     Assist Is the patient using a wheelchair?: Yes Type of Wheelchair: Manual    Wheelchair assist level: Supervision/Verbal cueing Max wheelchair distance: >150'    Wheelchair 50 feet with 2 turns activity    Assist        Assist Level:  Supervision/Verbal cueing   Wheelchair 150 feet activity     Assist      Assist Level: Supervision/Verbal cueing   Blood pressure 112/69, pulse 98, temperature 98.2 F (36.8 C), resp. rate 18, height $RemoveBe'5\' 7"'[id number]$  (1.702 m), weight 111.5 kg, SpO2 98 %.    Medical Problem List and Plan: 1. 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  2.  Impaired mobility: Continue Lovenox.  Venous Doppler is negative             -antiplatelet therapy: N/A 3. 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   12/6- will increase Lyrica to 75 mg TID for nerve pain 4. Anxiety: Continue Celexa 40 mg daily, Ambien 5 mg nightly.  Provide emotional support             -antipsychotic agents: N/A 5. Neuropsych: This patient is capable of making decisions on her own behalf. 6. Skin/Wound Care: Routine skin checks 7. Fluids/Electrolytes/Nutrition: Routine in and outs 8.  ID/MSSA bacteremia.  Continue Ancef 2 g every 8 hours through 04/14/2021 and stop- per ID.   Normal  ESR/CRP, WBCs and diff Monday 9.  Acute blood loss anemia.    Hemoglobin 9.5 on 11/28, labs ordered for tomorrow  12/6- Hb 9.1- stable- con't regimen 10.  Diabetes mellitus with peripheral neuropathy. A1c 13- had for 14 years;  NovoLog 8 units 3 times daily with meals, Levemir 55 units nightly, Tradjenta 5 mg daily, Glucophage 500 mg daily.  Check blood sugars before meals and at bedtime.  Diabetic teaching  CBG (last 3)  Recent Labs    03/15/21 1641 03/15/21 2113 03/16/21 0609  GLUCAP 136* 117* 162*   12/6- CBGs controlled- con't regimen 11.  Hyperlipidemia.  Pravachol 12.   Obesity.  Dietary follow-up 13. L shoulder bicipital tendinitis- suggest starting Voltaren gel 2 G QID  11/29- cannot do steroid injection due to lack of overall control of CBGs/A1c 13.   12/1- is tolerable- con't voltaren and current pain regimen 14. [name] Educated on avoiding contractures  18.  Hypoalbuminemia  Supplement initiated  LOS: 11 days A FACE TO FACE EVALUATION WAS PERFORMED  Malavika Lira 03/16/2021, 8:00 AM

## 2021-03-16 NOTE — Progress Notes (Signed)
Patient ID: Lori Schmidt, female   DOB: [birth year], 45 y.o.   MRN: [id number] Have faxed information to [employer] for them to begin searching for home health agency to provide [facility] and PT services

## 2021-03-16 NOTE — Progress Notes (Signed)
Occupational Therapy Session Note  Patient Details  Name: Lori Schmidt MRN: [id number] Date of Birth: 30-Apr-1975  Today's Date: 03/16/2021 OT Individual Time: 1110-1210 OT Individual Time Calculation (min): 60 min    Short Term Goals: Week 1:  OT Short Term Goal 1 (Week 1): Pt will perform BSC/toilet transfer with CGA and LRAD OT Short Term Goal 1 - Progress (Week 1): Progressing toward goal OT Short Term Goal 2 (Week 1): Pt will perform LB dress in most appropriate environment CGA OT Short Term Goal 2 - Progress (Week 1): Progressing toward goal OT Short Term Goal 3 (Week 1): Pt will perform sit <> stands at LRAD consistently with CGA in prep for ADL OT Short Term Goal 3 - Progress (Week 1): Progressing toward goal OT Short Term Goal 4 (Week 1): Pt will don limb guard with Supervision OT Short Term Goal 4 - Progress (Week 1): Met Week 2:  OT Short Term Goal 1 (Week 2): STGs = LTGs 2/2 ELOS Week 3:     Skilled Therapeutic Interventions/Progress Updates:    Patient seated at w/c LOF upon arrival, pt completed sink level grooming task involving washing her face and  brushing her teeth with s/u assist.  The pt was able to brush her hair at the same LOF.  The pt was able to donn the protective sleeve for her LLE with MinA for placement and managing the straps. The pt indicated that she was having low back pain of 6 on a 0-10 scale sharp in nature.  Nursing was present at the time and administered a muscle relaxer.  The pt complete sit to stand transfers at sink level, incorporating her RW for standing balance and greater ease.  The pt was Mod/MinA secondary to pain response.  The pt was able to maintain position in stand 3 x with a count of 10 with rest breaks as needed.  The pt  was able to adjust her anatomical positioning with slight changes in her position. The pt returned to a seated position and worked on [employer] theraex using a 3lb dumb bell 2 sets of 15 with rest breaks as needed for  bicep curl followed by shld flexion and horizontal abduction to improve upon UB strength for greater safety and independence with BADL related task performance.  The pt was able to tolerate external force to improve in her static and dynamic static balance during the use of the RW for addition balance and stability.    The pt remain at w/c LOF with her bedside table and call light available, all additional needs were addressed prior to exiting with the pt reporting a pain response of 4 on a 0-10 scale.   Therapy Documentation Precautions:  Precautions Precautions: Fall Precaution Comments: [name], anxiety Required Braces or Orthoses: Other Brace Other Brace: limb protector Restrictions Weight Bearing Restrictions: No LLE Weight Bearing: Non weight bearing General:   Vital Signs:   Pain:    Vision   Perception    Praxis   Balance   Exercises:   Other Treatments:     Therapy/Group: Individual Therapy  [name] 03/16/2021, 1:16 PM

## 2021-03-16 NOTE — Progress Notes (Signed)
Patient asks RN to check foam dressing that was placed on her left posterior thigh to protect the skin from rubbing on her stump shrinker. Area noted to be dry and redness noted. Skin was cleaned with NS and placed lotion for the dryness and kept open to air. Dan PA notified.

## 2021-03-16 NOTE — Progress Notes (Signed)
Physical Therapy Session Note  Patient Details  Name: Lori Schmidt MRN: [id number] Date of Birth: 01-Apr-1976  Today's Date: 03/16/2021 PT Individual Time: 0086-7619 PT Individual Time Calculation (min): 83 min   Short Term Goals: Week 1:  PT Short Term Goal 1 (Week 1): Pt will transfer to [facility] with CGA and LRAD PT Short Term Goal 1 - Progress (Week 1): Met PT Short Term Goal 2 (Week 1): Pt will ambulate 78ft with mod assist and RW PT Short Term Goal 2 - Progress (Week 1): Met PT Short Term Goal 3 (Week 1): Pt will propel WC x 1104ft without assist PT Short Term Goal 3 - Progress (Week 1): Met Week 2:  PT Short Term Goal 1 (Week 2): =LTGs d/t ELOS  Skilled Therapeutic Interventions/Progress Updates:    pt received in bed and agreeable to therapy. Pt reports 0/10 pain at rest, premedicated. 4/10 with activity, rest and positioning provided as needed. Worked on sensory sensitization on residual limb while sitting EOB for improved sensory integration. Donned limb guard and  white shrinker sock to help anchor limb guard. Sit to stand x 5 from elevated bed with education on body mechanics for improved stand, including foot placement and anterior weight shift. Stand pivot transfer EOB<>w/c<> mat table with min A for balance and VC for technique. Gait 2 x 27 ft with 2 turns with min A and VC to clear foot and decr step length. Rest break used for education on energy conservation and skin protection. Pt then took side steps x 6 with mod A to position self for sit>supine with supervision on mat table.   Pt then performed the following exercises to promote LE strength and endurance: Prone hip extension 3x 10 BIL  Sidelying hip abduction LLE 3 x 10 Bridges with residual limb on physioball, 2 x 10, pt able to achieve pelvic tilt and small lift but not full pelvic clearance.  Some difficulty with supine>sit d/t back pain but able to achieve transfer with supervision and VC.  Pt returned to room  and remained in w/c,  was left with all needs in reach and alarm active.   Therapy Documentation Precautions:  Precautions Precautions: Fall Precaution Comments: [name], anxiety Required Braces or Orthoses: Other Brace Other Brace: limb protector Restrictions Weight Bearing Restrictions: No LLE Weight Bearing: Non weight bearing General:       Therapy/Group: Individual Therapy  Mickel Fuchs 03/16/2021, 10:14 AM

## 2021-03-16 NOTE — Patient Care Conference (Signed)
Inpatient RehabilitationTeam Conference and Plan of Care Update Date: 03/16/2021   Time: 11:37 AM    Patient Name: Lori Schmidt      Medical Record Number: [id number]  Date of Birth: 1975-10-28 Sex: Female         Room/Bed: 4W10C/4W10C-01 Payor Info: Payor: [employer] / Plan: CIGNA MANAGED / Product Type: *No Product type* /    Admit Date/Time:  03/05/2021  6:59 PM  Primary Diagnosis:  S/P BKA (below knee amputation) unilateral, left [facility])  Hospital Problems: Principal Problem:   S/P BKA (below knee amputation) unilateral, left (HCC) Active Problems:   Discitis of lumbar region   Left below-knee amputee (HCC)   Hypoalbuminemia due to protein-calorie malnutrition (HCC)   Diabetic peripheral neuropathy (HCC)   Acute blood loss anemia   Dyslipidemia    Expected Discharge Date: Expected Discharge Date: 03/23/21  Team Members Present: Physician leading conference: Dr. Genice Rouge Social Worker Present: Dossie Der, LCSW Nurse Present: Kennyth Arnold, RN PT Present: Bernie Covey, PT OT Present: Earleen Newport, OT PPS Coordinator present : Fae Pippin, SLP     Current Status/Progress Goal Weekly Team Focus  Bowel/Bladder   Continent of B/B. LBM 12/5  Remain continent  Toilet prn   Swallow/Nutrition/ Hydration             ADL's   Min stand pivots, hopping improving, set up UB, Min A LB bathe, Mod LB dress, back pain limiting, L shoulder pain  Supervision overall  ADL transfers, sit <> stands, adaptive self care skills, pain management, back pain prevention, dynamic balance   Mobility   min-mod STS, short distance gait with CGA and w/c follow. Limited by back pain  mod I w/c and short distance gait,  gait, pain management   Communication             Safety/Cognition/ Behavioral Observations            Pain   Lower back pain/spasms, on scheduled medications, prn meds available.  Pain Manageable  Assess Qshift and prn   Skin   Left BKA  Promote healing and  prevention of infection  Assess Qshift and prn, Daily dressing change     Discharge Planning:  mom has been in to observe in therapies she provide support to pt also. Have begun working on IV antibiotics for home due to insurance difficulty. Had peer support last Friday with two other pt's with similar situations-pt found very healpful   Team Discussion: Labs are good. Experiencing spasms. Limb guard feels heavy. Fearful of falling. Has intermittent loss of balance. Shrinker to residual limb only, no dressings. Nursing addressing pain. Has foam dressing to back of leg due to the limb guard rubbing skin. Will need 20" WC. Will have 24/7 care. Patient on target to meet rehab goals: yes, supervision goals but may have to downgrade some goals.  *See Care Plan and progress notes for long and short-term goals.   Revisions to Treatment Plan:  Not at this time.  Teaching Needs: Family education, medication/pain management, skin/wound care, safety awareness, transfer/gait training, etc.  Current Barriers to Discharge: Decreased caregiver support, Home enviroment access/layout, IV antibiotics, Wound care, Weight, Weight bearing restrictions, and Medication compliance  Possible Resolutions to Barriers: Family education Follow up [facility] Order DME Follow up PT/OT     Medical Summary Current Status: pt's nerve and phantom pain - working on with meds; [name]- just shrinker; continent B/B; on IV ABX until 04/14/21-  Barriers to Discharge: Decreased  family/caregiver support;Home enviroment access/layout;IV antibiotics;Medical stability;Wound care;Weight bearing restrictions;Weight  Barriers to Discharge Comments: will have 24/7- drop arm commode; mother 25/7, but has rollator use herself-- limiting- Possible Resolutions to [employer] Focus: limitaiton- anxiety biggest barrier; doing better; doesn't like limb guard; WB restrictions and back pain/muscle spasms; goals supervision- been min /CGA for 1 week;  irritation on back of LLE from limb guard- increased Lyrica for nerve pain- d/c 12/13   Continued Need for Acute Rehabilitation Level of Care: The patient requires daily medical management by a physician with specialized training in physical medicine and rehabilitation for the following reasons: Direction of a multidisciplinary physical rehabilitation program to maximize functional independence : Yes Medical management of patient stability for increased activity during participation in an intensive rehabilitation regime.: Yes Analysis of laboratory values and/or radiology reports with any subsequent need for medication adjustment and/or medical intervention. : Yes   I attest that I was present, lead the team conference, and concur with the assessment and plan of the team.   Tennis Must 03/16/2021, 4:14 PM

## 2021-03-16 NOTE — Progress Notes (Signed)
Patient ID: Lori Schmidt, female   DOB: 1975-06-19, 45 y.o.   MRN: [id number] Met with pt and Mom who is here to observe in therapies and provide support. Pt is very pleased with her progress and continues to hope she will continue. She is aware still working toward discharge date of 12/13. She feels the peer support she had with the other two patients was very helpful and plans to keep in contact with them once discharged.

## 2021-03-17 LAB — GLUCOSE, CAPILLARY
Glucose-Capillary: 100 mg/dL — ABNORMAL HIGH (ref 70–99)
Glucose-Capillary: 97 mg/dL (ref 70–99)
Glucose-Capillary: 84 mg/dL (ref 70–99)
Glucose-Capillary: 120 mg/dL — ABNORMAL HIGH (ref 70–99)

## 2021-03-17 NOTE — Progress Notes (Signed)
PROGRESS NOTE   Subjective/Complaints:  Pt reports limb guard keeps falling off, since swelling down so much.  Increase in Lyrica has been fantastic- pain down to almost zero- much better.    ROS:   Pt denies SOB, abd pain, CP, N/V/C/D, and vision changes     Objective:   No results found. Recent Labs    03/15/21 0336  WBC 5.7  HGB 9.1*  HCT 28.4*  PLT 258   Recent Labs    03/15/21 0336  NA 138  K 3.7  CL 104  CO2 27  GLUCOSE 117*  BUN 18  CREATININE 0.38*  CALCIUM 8.5*    Intake/Output Summary (Last 24 hours) at 03/17/2021 0824 Last data filed at 03/16/2021 1848 Gross per 24 hour  Intake 420 ml  Output --  Net 420 ml        Physical Exam: Vital Signs Blood pressure 119/72, pulse 95, temperature 98.3 F (36.8 C), resp. rate 18, height _0  (1.702 m), weight 111.8 kg, SpO2 98 %.    General: awake, alert, appropriate, sitting up in bed; PT in room; NAD HENT: conjugate gaze; oropharynx moist CV: regular rate; no JVD Pulmonary: CTA B/L; no W/R/R- good air movement GI: soft, NT, ND, (+)BS Psychiatric: appropriate- bright affect Neurological: Ox3  Musc: [name] looks great- some dried scabbing and staples intact- only a trace dog ear on medial aspect, but otherwise, they are resolved; Swelling 75% better- no drainage.  Neuro: Alert Motor: Right lower extremity: 4+/5 proximal distal, stable Left lower extremity: Hip flexion with some limitations due to pain   Assessment/Plan: 1. Functional deficits which require 3+ hours per day of interdisciplinary therapy in a comprehensive inpatient rehab setting. Physiatrist is providing close team supervision and 24 hour management of active medical problems listed below. Physiatrist and rehab team continue to assess barriers to discharge/monitor patient progress toward functional and medical goals  Care Tool:  Bathing    Body parts bathed by patient:  Right arm, Left arm, Chest, Abdomen, Front perineal area, Right upper leg, Left upper leg, Face, Buttocks   Body parts bathed by helper: Right lower leg Body parts n/a: Left lower leg   Bathing assist Assist Level: Minimal Assistance - Patient > 75%     Upper Body Dressing/Undressing Upper body dressing   What is the patient wearing?: Pull over shirt    Upper body assist Assist Level: Set up assist    Lower Body Dressing/Undressing Lower body dressing      What is the patient wearing?: Pants     Lower body assist Assist for lower body dressing: Moderate Assistance - Patient 50 - 74%     Toileting Toileting Toileting Activity did not occur Landscape architect and hygiene only):  (on BSC)  Toileting assist Assist for toileting: Minimal Assistance - Patient > 75%     Transfers Chair/bed transfer  Transfers assist     Chair/bed transfer assist level: Minimal Assistance - Patient > 75%     Locomotion Ambulation   Ambulation assist   Ambulation activity did not occur: Safety/medical concerns  Assist level: Minimal Assistance - Patient > 75% Assistive device: Walker-rolling Max distance: 25  ft   Walk 10 feet activity   Assist  Walk 10 feet activity did not occur: Safety/medical concerns  Assist level: Minimal Assistance - Patient > 75% Assistive device: Walker-rolling   Walk 50 feet activity   Assist Walk 50 feet with 2 turns activity did not occur: Safety/medical concerns         Walk 150 feet activity   Assist Walk 150 feet activity did not occur: Safety/medical concerns         Walk 10 feet on uneven surface  activity   Assist Walk 10 feet on uneven surfaces activity did not occur: Safety/medical concerns         Wheelchair     Assist Is the patient using a wheelchair?: Yes Type of Wheelchair: Manual    Wheelchair assist level: Supervision/Verbal cueing Max wheelchair distance: >150'    Wheelchair 50 feet with 2 turns  activity    Assist        Assist Level: Supervision/Verbal cueing   Wheelchair 150 feet activity     Assist      Assist Level: Supervision/Verbal cueing   Blood pressure 119/72, pulse 95, temperature 98.3 F (36.8 C), resp. rate 18, height _0  (1.702 m), weight 111.8 kg, SpO2 98 %.    Medical Problem List and Plan: 1. 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  2.  Impaired mobility: Continue Lovenox.  Venous Doppler is negative             -antiplatelet therapy: N/A 3. 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   12/6- will increase Lyrica to 75 mg TID for nerve pain  12/7- pain MUCH better- con't current regimen 4. Anxiety: Continue Celexa 40 mg daily, Ambien 5 mg nightly.  Provide emotional support             -antipsychotic agents: N/A 5. Neuropsych: This patient is capable of making decisions on her own behalf. 6. Skin/Wound Care: Routine skin checks 7. Fluids/Electrolytes/Nutrition: Routine in and outs 8.  ID/MSSA bacteremia.  Continue Ancef 2 g every 8 hours through 04/14/2021 and stop- per ID.   Normal  ESR/CRP, WBCs and diff Monday 9.  Acute blood loss anemia.    Hemoglobin 9.5 on 11/28, labs ordered for tomorrow  12/6- Hb 9.1- stable- con't regimen 10.  Diabetes mellitus with peripheral neuropathy. A1c 13- had for 14 years;  NovoLog 8 units 3 times daily with meals, Levemir 55 units nightly, Tradjenta 5 mg daily, Glucophage 500 mg daily.  Check blood sugars before meals and at bedtime.  Diabetic teaching  CBG (last 3)  Recent Labs    03/16/21 1649 03/16/21 2102 03/17/21 0611   GLUCAP 114* 116* 100*   12/7- CBG's controlled- con't regimen 11.  Hyperlipidemia.  Pravachol 12.  Obesity.  Dietary follow-up 13. L shoulder bicipital tendinitis- suggest starting Voltaren gel 2 G QID  11/29- cannot do steroid injection due to lack of overall control of CBGs/A1c 13.   12/1- is tolerable- con't voltaren and current pain regimen 14. [name] Educated on avoiding contractures  12/7- will try and get a smaller limb guard- per PT, sometimes unsafe with transfers, so  don't want to transfer without it.  18.  Hypoalbuminemia  Supplement initiated  LOS: 12 days A FACE TO FACE EVALUATION WAS PERFORMED  Hayla Hinger 03/17/2021, 8:24 AM

## 2021-03-17 NOTE — Progress Notes (Signed)
Orthopedic Tech Progress Note Patient Details:  Lori Schmidt 06-May-1975 [id number]  Patient ID: Lori Schmidt, female   DOB: January 29, 1976, 45 y.o.   MRN: [id number]  Lori Schmidt 03/17/2021, 8:40 AM Called [employer] to order. Spoke about possibly getting a smaller size per the comments on the order.

## 2021-03-17 NOTE — Progress Notes (Signed)
Physical Therapy Session Note  Patient Details  Name: Lori Schmidt MRN: [id number] Date of Birth: 06/26/1975  Today's Date: 03/17/2021 PT Individual Time: 0802-0930, 1300-1340 PT Individual Time Calculation (min): 88 min, 40 min   Short Term Goals: Week 1:  PT Short Term Goal 1 (Week 1): Pt will transfer to [facility] with CGA and LRAD PT Short Term Goal 1 - Progress (Week 1): Met PT Short Term Goal 2 (Week 1): Pt will ambulate 9f with mod assist and RW PT Short Term Goal 2 - Progress (Week 1): Met PT Short Term Goal 3 (Week 1): Pt will propel WC x 1511fwithout assist PT Short Term Goal 3 - Progress (Week 1): Met Week 2:  PT Short Term Goal 1 (Week 2): =LTGs d/t ELOS  Skilled Therapeutic Interventions/Progress Updates:    Session 1: pt received in bed and agreeable to therapy. Pt premedicated for pain, some pain with activity, rest and positioning as needed. Supine>sit with supervision. MD in/out for morning rounds. Pt education and practice on touching limb for desensitization. Pt reports no abnormal sensations when touching her residual limb. Donned limb guard with assist to tighten knee strap. Stand pivot transfer to w/c with min facilitation to power up to stand. Pt brushed teeth at w/c level and then propelled chair with BUE to therapy gym with supervision. Pt then directed in Sit to stand to RW x 10 with instruction and cueing for improved anterior weight shift. Pt then directed in standing activity with RW and at hi-low table. With repetition and cueing for balance and posture, pt was able to use both hands for activity at hi low table. At this time, [employer] representative, ScNicki Reaperbrought smaller limb guard and provided education on prosthetic options for the future. Pt returned to room and remained in w/c, was left with all needs in reach and alarm active.   Session 2: Pt seated in w/c on arrival and agreeable to therapy. Pt premedicated for pain and reporting no discomfort at rest.  Pt expressed frustration over difficulty with Sit to stand transfers. Pt directed in continued instruction and VC for technique, Sit to stand x 10 fading to light CGA. Pt propelled w/c with BUE x 50 ft for endurance and functional mobility. Pt then directed in gait x 30  ft with CGA and w/c follow, including navigating incline into bathroom with heavy CGA and max verbal encouragement. ambulatory transfer to commode. Pt left in care of RN to complete toileting at end of session.   Therapy Documentation Precautions:  Precautions Precautions: Fall Precaution Comments: [name], anxiety Required Braces or Orthoses: Other Brace Other Brace: limb protector Restrictions Weight Bearing Restrictions: No LLE Weight Bearing: Non weight bearing     Therapy/Group: Individual Therapy  OlMickel Fuchs2/10/2020, 12:42 PM

## 2021-03-17 NOTE — Progress Notes (Signed)
Occupational Therapy Session Note  Patient Details  Name: Lori Schmidt MRN: [id number] Date of Birth: 16-May-1975  Today's Date: 03/17/2021 OT Individual Time: 9528-4132 OT Individual Time Calculation (min): 70 min    Short Term Goals: Week 1:  OT Short Term Goal 1 (Week 1): Pt will perform BSC/toilet transfer with CGA and LRAD OT Short Term Goal 1 - Progress (Week 1): Progressing toward goal OT Short Term Goal 2 (Week 1): Pt will perform LB dress in most appropriate environment CGA OT Short Term Goal 2 - Progress (Week 1): Progressing toward goal OT Short Term Goal 3 (Week 1): Pt will perform sit <> stands at LRAD consistently with CGA in prep for ADL OT Short Term Goal 3 - Progress (Week 1): Progressing toward goal OT Short Term Goal 4 (Week 1): Pt will don limb guard with Supervision OT Short Term Goal 4 - Progress (Week 1): Met Week 2:  OT Short Term Goal 1 (Week 2): STGs = LTGs 2/2 ELOS   Skilled Therapeutic Interventions/Progress Updates:    Pt greeted at time of session sitting up in wheelchair eager to participate in OT session, no pain reported. Pt states new dose of pain meds greatly improved comfort. Pt agreeable to shower level bathing, transported to bathroom and stand pivot > TTB with Min A with hopping method and using RW. Doffed LB clothing from waist level prior to sitting down, doffed remaining clothing seated. Covered RUE PICC and LLE residual limb x2 covers to ensure water proofed for shower. Pt completing shower level bathing with CGA, lateral leans for buttocks and did not stand in shower. Sit > stand from low bench with Mod A, needing increased assist 2/2 no handle to push from. Pt threaded underwear and pants prior to this sit > stand and pulled over hips before stand pivot back to wheelchair. Therapist assisting with sock/shoe for time conservation. Set up at sink and pt performing grooming tasks, therapist assist with braiding hair. Up in chair alarm on call  bell in reach.   Therapy Documentation Precautions:  Precautions Precautions: Fall Precaution Comments: [name], anxiety Required Braces or Orthoses: Other Brace Other Brace: limb protector Restrictions Weight Bearing Restrictions: No LLE Weight Bearing: Non weight bearing     Therapy/Group: Individual Therapy  [name] 03/17/2021, 7:28 AM

## 2021-03-17 NOTE — Progress Notes (Signed)
Patient complaining of redness on stump leg where limb guard was today. Nurse assessed area. Patient not complaining of pain to area, but requesting provider assess in the AM. Nurse stated they would leave note.

## 2021-03-18 LAB — GLUCOSE, CAPILLARY
Glucose-Capillary: 87 mg/dL (ref 70–99)
Glucose-Capillary: 86 mg/dL (ref 70–99)
Glucose-Capillary: 87 mg/dL (ref 70–99)
Glucose-Capillary: 134 mg/dL — ABNORMAL HIGH (ref 70–99)
Glucose-Capillary: 136 mg/dL — ABNORMAL HIGH (ref 70–99)

## 2021-03-18 NOTE — Progress Notes (Signed)
Occupational Therapy Session Note  Patient Details  Name: Lori Schmidt MRN: [id number] Date of Birth: 1975-10-23  Today's Date: 03/18/2021 OT Group Time: 1430-1530 OT Group Time Calculation (min): 60 min   Short Term Goals: Week 2:  OT Short Term Goal 1 (Week 2): STGs = LTGs 2/2 ELOS  Skilled Therapeutic Interventions/Progress Updates:  Pt participated in group session with a focus on BUE strength and endurance to facilitate improved activity tolerance and strength for higher level BADLs and functional mobility tasks. Pt first in engaged in seated UB therapeutic activity where pt was instructed to roll large dice when it was pts turn. Each number rolled correlated with UB therex/ number of repetitions  listed on board. UB therex included flys, chest presses, punches, bicep curls, upward rows, over heard presses with a range of reps from 10-20.  Pt utilized 3 lb hand weights during session. Pt utilizes modifications such as decreasing amt of weight as needed, completing therex unilaterally and taking rest breaks as needed. Education provided on importance of determining appropriate modifications that benefited each pt in order to meet pts specific needs.  Ended with 2 mins of guided deep breathing and stretching where pts were instructed on sequence of deep breathing and benefits of deep breathing to accommodate for stress, anxiety and relaxation. Remainder of session to focus on BUE strength and endurance with pts passing ball around circle. Graded task up with pts instructing to complete chest passes's, volley passes, and  higher level motor planning pass with pt tossing ball up in air, clapping once and passing to next group participant. Pt transported back to room by RT.   Therapy Documentation Precautions:  Precautions Precautions: Fall Precaution Comments: [name], anxiety Required Braces or Orthoses: Other Brace Other Brace: limb protector Restrictions Weight Bearing Restrictions:  No LLE Weight Bearing: Non weight bearing  Pain: no pain reported during session     Therapy/Group: Group Therapy  Barron Schmid 03/18/2021, 4:12 PM

## 2021-03-18 NOTE — Evaluation (Signed)
Recreational Therapy Assessment and Plan  Patient Details  Name: Lori Schmidt MRN: [id number] Date of Birth: 12/01/1975 Today's Date: 03/18/2021  Rehab Potential:  Good ELOS:   d/c 12/13  Assessment   Hospital Problem: Principal Problem:   S/P BKA (below knee amputation) unilateral, left ([place]) Active Problems:   Discitis of lumbar region   Left below-knee amputee [facility])     Past Medical History:      Past Medical History:  Diagnosis Date   Hyperlipidemia     Hypertension     Obesity     Type 2 diabetes mellitus ([place])      Past Surgical History:       Past Surgical History:  Procedure Laterality Date   AMPUTATION Left 02/19/2021    Procedure: AMPUTATION BELOW KNEE;  Surgeon: Newt Minion, MD;  Location: [facility];  Service: Orthopedics;  Laterality: Left;   BREAST CYST EXCISION Right 05/2018    four cysts removed on right breast/axilla area   TEE WITHOUT CARDIOVERSION N/A 02/23/2021    Procedure: TRANSESOPHAGEAL ECHOCARDIOGRAM (TEE);  Surgeon: Pixie Casino, MD;  Location: [facility] ENDOSCOPY;  Service: Cardiovascular;  Laterality: N/A;      Assessment & Plan Clinical Impression: Lori Schmidt is a 45 year old right-handed female with history of hypertension, hyperlipidemia, obesity as well as diabetes mellitus, quit smoking 10 years ago.  Per chart review patient lives with her 41 year old daughter.  1 level home 5 steps to entry.  She works at [employer].  Independent prior to admission.  She does have a mother in the area.  Presented 02/17/2021 with increasing left lower extremity swelling and decreased functional gait as well as spiked a low-grade fever, nausea and vomiting and general malaise.  She denied any injury.  Patient had initially been seen at [facility] 02/11/2021 for left lower extremity swelling plan was for a venous Doppler however patient left AMA.  In the ED 02/16/2021 venous Dopplers completed of left lower extremity showing no DVT and she received  antibiotic therapy and was discharged home with blood cultures pending.  The following day patient was contacted blood cultures positive MSSA and she returned to the ED.  In the ED patient tachycardic 118 potassium 2.7 glucose 335 sodium 128, WBC 8600, hemoglobin 10.8, lactic acid 1.2, urine drug screen negative.  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  Numerous peripherally enhancing joint effusions throughout the left midfoot.  Extensive tenosynovial fluid collections throughout the flexor and extensor tendons of the left foot compatible with infectious tenosynovitis.  Orthopedic service Dr. Sharol Given consulted patient underwent left transtibial amputation application of wound VAC 02/19/2021.  Patient was maintained on antibiotic therapy with Ancef per infectious disease through 04/14/2021.  TEE completed showing no evidence of endocarditis.  Patient with persistent low back pain with MRI completed 02/20/2021 showing edema L4-L5 disc with adjacent inferior L4 and superior L5 endplate edema and enhancement concerning for discitis/osteomyelitis and again repeated MRI 02/28/2021 redemonstrating marrow edema L4-5 degree of signal change slightly progressed compared to previous MRI.  Scans were reviewed by infectious disease recommendations were to continue intravenous antibiotics as directed.  Subcutaneous Lovenox for DVT prophylaxis.  Acute blood loss anemia 9.7 and monitored.  Therapy evaluations completed due to patient decreased functional mobility was admitted for a comprehensive rehab program. Patient transferred to CIR on 03/05/2021.     Pt presents with decreased activity tolerance, decreased functional mobility, decreased balance, decreased  coordination, feelings of stress Limiting pt's independence with leisure/community pursuits.  Plan  Min 1 TR session >20 minutes during LOS  Recommendations for other services:  None   Discharge Criteria: Patient will be discharged from TR if patient refuses treatment 3 consecutive times without medical reason.  If treatment goals not met, if there is a change in medical status, if patient makes no progress towards goals or if patient is discharged from hospital.  The above assessment, treatment plan, treatment alternatives and goals were discussed and mutually agreed upon: by patient  Session Note:    No c/o.  Pt participated in a 60 minute TAG with an emphasis on activity tolerance, dynamic sitting balance, UE strengthening, safety awareness, socialization, & education on relaxation strategies.  Pt requires supervision for TAs.  Activities included rolling a large die on the floor and completing exercise repetitions that corresponded with the # rolled.  Transitioned to ball toss and volleyball passes.    [place] 03/18/2021, 1:53 PM

## 2021-03-18 NOTE — Progress Notes (Signed)
Occupational Therapy Session Note  Patient Details  Name: Lori Schmidt MRN: [id number] Date of Birth: 1976-02-14  Today's Date: 03/18/2021 OT Individual Time: 0100-0200 OT Individual Time Calculation (min): 60 min    Short Term Goals: Week 1:  OT Short Term Goal 1 (Week 1): Pt will perform BSC/toilet transfer with CGA and LRAD OT Short Term Goal 1 - Progress (Week 1): Progressing toward goal OT Short Term Goal 2 (Week 1): Pt will perform LB dress in most appropriate environment CGA OT Short Term Goal 2 - Progress (Week 1): Progressing toward goal OT Short Term Goal 3 (Week 1): Pt will perform sit <> stands at LRAD consistently with CGA in prep for ADL OT Short Term Goal 3 - Progress (Week 1): Progressing toward goal OT Short Term Goal 4 (Week 1): Pt will don limb guard with Supervision OT Short Term Goal 4 - Progress (Week 1): Met Week 2:  OT Short Term Goal 1 (Week 2): STGs = LTGs 2/2 ELOS Week 3:     Skilled Therapeutic Interventions/Progress Updates:    Patient in w/c upon arrival, pt completed functional transfer from sit to stand with CGA using there RW for static standing balance.  The pt complete a toilet transfer with CGA as well. The pt was able to ambulate to the sink area with the w/c in close proximity using her RW to stabilizing with CGA, she was able to wash her face, brush her hair and teeth with SBA only.  The pt was able to return to the w/c with CGA and demonstrated UB strength of 4/5MMT against oppositional force exerted on a exernal force to improve her UB strength for safe and independent function with BADL related task performance. The pt completed UB exercises using a 3lb dumb bell 2 sets of 9for bicep curls, shld flexion, and horizontal abduction to improve UB strength for safety during functional mobility. The pt went on to complete IADL related task in bed making, incorporating the RW for safety with dynamic and static balance. The pt remained in the w/c with  her bedside table within reach, her call light available and all additional needs addressed.  The pt reported no c/o pain during this tx session.   Therapy Documentation Precautions:  Precautions Precautions: Fall Precaution Comments: [name], anxiety Required Braces or Orthoses: Other Brace Other Brace: limb protector Restrictions Weight Bearing Restrictions: No LLE Weight Bearing: Non weight bearing General:   Vital Signs: Therapy Vitals Temp: 98.3 F (36.8 C) Pulse Rate: 95 Resp: 18 BP: 115/68 Patient Position (if appropriate): Sitting Oxygen Therapy SpO2: 98 % O2 Device: Room Air Pain:   Research officer, political party   Exercises:   Other Treatments:     Therapy/Group: Individual Therapy  [name] 03/18/2021, 2:24 PM

## 2021-03-18 NOTE — Progress Notes (Signed)
Occupational Therapy Session Note  Patient Details  Name: Lori Schmidt MRN: [id number] Date of Birth: [birth year]  Today's Date: 03/19/2021 OT Individual Time: 1420-1530 OT Individual Time Calculation (min): 70 min   Skilled Therapeutic Interventions/Progress Updates:    Pt greeted in the w/c, requesting to shower. CGA for lateral scoot<TTB and then pt bathed at seated level with supervision/cues. Note that pts mother observed OT wrap up pts PICC line and then had hands on practice wrapping pts residual limb. Afterwards dressing was completed sit<stand from TTB due to presence of company in her room. CGA for dynamic standing balance using the grab bar and Max A to don the Rt gripper sock. CGA for lateral scoot to the w/c and then pt brushed her hair and completed oral care with setup. Pt remained sitting up at close of session, left in care of nurse to reconnect to IV. Tx focus placed on adaptive self care skills, pt/family education, and d/c planning.   Therapy Documentation Precautions:  Precautions Precautions: Fall Precaution Comments: [name], anxiety Required Braces or Orthoses: Other Brace Other Brace: limb protector Restrictions Weight Bearing Restrictions: No LLE Weight Bearing: Non weight bearing Pain: no c/o pain during tx   ADL: ADL Eating: Not assessed Grooming: Setup Upper Body Bathing: Supervision/safety Lower Body Bathing: Minimal assistance Upper Body Dressing: Setup Lower Body Dressing: Maximal assistance Toileting: Not assessed Toilet Transfer: Moderate assistance Toilet Transfer Method: Stand pivot   Therapy/Group: Individual Therapy  Rechy Bost A Karita Dralle 03/19/2021, 3:46 PM

## 2021-03-18 NOTE — Progress Notes (Signed)
PROGRESS NOTE   Subjective/Complaints:  Pt reports rubbing from limb guard on back of L thigh- worried about it- .  Back pain better with increase in Lyrica and sleeping better as well.  Didn't wake up til 5am- usually 3-4 am.  Anxiety WAY better.    ROS:   Pt denies SOB, abd pain, CP, N/V/C/D, and vision changes     Objective:   No results found. No results for input(s): WBC, HGB, HCT, PLT in the last 72 hours.  No results for input(s): NA, K, CL, CO2, GLUCOSE, BUN, CREATININE, CALCIUM in the last 72 hours.   Intake/Output Summary (Last 24 hours) at 03/18/2021 0914 Last data filed at 03/18/2021 0857 Gross per 24 hour  Intake 960 ml  Output --  Net 960 ml        Physical Exam: Vital Signs Blood pressure 110/74, pulse 89, temperature 98.7 F (37.1 C), resp. rate 16, height $RemoveBe'5\' 7"'zhwDJJpVz$  (1.702 m), weight 111.8 kg, SpO2 95 %.     General: awake, alert, appropriate, sitting up in bed; sister at bedside; NAD HENT: conjugate gaze; oropharynx moist CV: regular rate; no JVD Pulmonary: CTA B/L; no W/R/R- good air movement GI: soft, NT, ND, (+)BS Psychiatric: appropriate- bright- much less anxious Neurological: Ox3  Musc: [name] looks great- some dried scabbing and staples intact- only a trace dog ear on medial aspect, but otherwise, they are resolved; Swelling 75% better- no drainage.  Shrinker in place- swelling better Skin- some irritation and mild rawness on back of L middle thigh, however not open! Neuro: Alert Motor: Right lower extremity: 4+/5 proximal distal, stable Left lower extremity: Hip flexion with some limitations due to pain   Assessment/Plan: 1. Functional deficits which require 3+ hours per day of interdisciplinary therapy in a comprehensive inpatient rehab setting. Physiatrist is providing close team supervision and 24 hour management of active medical problems listed below. Physiatrist and rehab team  continue to assess barriers to discharge/monitor patient progress toward functional and medical goals  Care Tool:  Bathing    Body parts bathed by patient: Right arm, Left arm, Chest, Abdomen, Front perineal area, Right upper leg, Left upper leg, Face, Buttocks, Right lower leg   Body parts bathed by helper: Right lower leg Body parts n/a: Left lower leg   Bathing assist Assist Level: Contact Guard/Touching assist     Upper Body Dressing/Undressing Upper body dressing   What is the patient wearing?: Pull over shirt    Upper body assist Assist Level: Set up assist    Lower Body Dressing/Undressing Lower body dressing      What is the patient wearing?: Pants     Lower body assist Assist for lower body dressing: Moderate Assistance - Patient 50 - 74%     Toileting Toileting Toileting Activity did not occur Landscape architect and hygiene only):  (on BSC)  Toileting assist Assist for toileting: Minimal Assistance - Patient > 75%     Transfers Chair/bed transfer  Transfers assist     Chair/bed transfer assist level: Minimal Assistance - Patient > 75%     Locomotion Ambulation   Ambulation assist   Ambulation activity did not occur: Safety/medical  concerns  Assist level: Minimal Assistance - Patient > 75% Assistive device: Walker-rolling Max distance: 25 ft   Walk 10 feet activity   Assist  Walk 10 feet activity did not occur: Safety/medical concerns  Assist level: Minimal Assistance - Patient > 75% Assistive device: Walker-rolling   Walk 50 feet activity   Assist Walk 50 feet with 2 turns activity did not occur: Safety/medical concerns         Walk 150 feet activity   Assist Walk 150 feet activity did not occur: Safety/medical concerns         Walk 10 feet on uneven surface  activity   Assist Walk 10 feet on uneven surfaces activity did not occur: Safety/medical concerns         Wheelchair     Assist Is the patient using a  wheelchair?: Yes Type of Wheelchair: Manual    Wheelchair assist level: Supervision/Verbal cueing Max wheelchair distance: >150'    Wheelchair 50 feet with 2 turns activity    Assist        Assist Level: Supervision/Verbal cueing   Wheelchair 150 feet activity     Assist      Assist Level: Supervision/Verbal cueing   Blood pressure 110/74, pulse 89, temperature 98.7 F (37.1 C), resp. rate 16, height $RemoveBe'5\' 7"'XHLxLpmHl$  (1.702 m), weight 111.8 kg, SpO2 95 %.    Medical Problem List and Plan: 1. [name] with functional deficits secondary to left lower extremity osteomyelitis Status Post left transtibial amputation 02/19/2021/MSSA bacteremia/L4-5 discitis  Con't CIR PT and OT- got smaller limb guard- still has to be tight- wear cloth between skin and limb guard if possible so doesn't rub.   2.  Impaired mobility: Continue Lovenox.  Venous Doppler is negative             -antiplatelet therapy: N/A 3. Pain from L4/5 discitis- Continue OxyContin sustained-release 10 mg every 12 hours,, Robaxin-750 milligram every 6 hours, Naprosyn 500 mg twice daily oxycodone as needed  12/8- will con't Lyrica 75 mg TID- pain doing much better- con't regimen 4. Anxiety: Continue Celexa 40 mg daily, Ambien 5 mg nightly.  Provide emotional support             -antipsychotic agents: N/A 5. Neuropsych: This patient is capable of making decisions on her own behalf. 6. Skin/Wound Care: Routine skin checks 7. Fluids/Electrolytes/Nutrition: Routine in and outs 8.  ID/MSSA bacteremia.  Continue Ancef 2 g every 8 hours through 04/14/2021 and stop- per ID.   Normal  ESR/CRP, WBCs and diff Monday 9.  Acute blood loss anemia.    Hemoglobin 9.5 on 11/28, labs ordered for tomorrow  12/6- Hb 9.1- stable- con't regimen 10.  Diabetes mellitus with peripheral neuropathy. A1c 13- had for 14 years;  NovoLog 8 units 3 times daily with meals, Levemir 55 units nightly, Tradjenta 5 mg daily, Glucophage 500 mg daily.  Check blood  sugars before meals and at bedtime.  Diabetic teaching  CBG (last 3)  Recent Labs    03/17/21 1725 03/17/21 2145 03/18/21 0628  GLUCAP 84 120* 87   12/8- CBGs controlled- con't regimen- pt educated already on insulin and CBG how to do.  11.  Hyperlipidemia.  Pravachol 12.  Obesity.  Dietary follow-up 13. L shoulder bicipital tendinitis- suggest starting Voltaren gel 2 G QID  11/29- cannot do steroid injection due to lack of overall control of CBGs/A1c 13.   12/1- is tolerable- con't voltaren and current pain regimen 14. [name]  Educated on avoiding contractures  12/7- will try and get a smaller limb guard- per PT, sometimes unsafe with transfers, so don't want to transfer without it.   12/8- got new limb guard 18.  Hypoalbuminemia  Supplement initiated  LOS: 13 days A FACE TO FACE EVALUATION WAS PERFORMED  Osker Ayoub 03/18/2021, 9:14 AM

## 2021-03-18 NOTE — Progress Notes (Signed)
Physical Therapy Session Note  Patient Details  Name: Lori Schmidt MRN: [id number] Date of Birth: 10/11/1975  Today's Date: 03/18/2021 PT Individual Time: 0915-1030 PT Individual Time Calculation (min): 75 min   Short Term Goals: Week 1:  PT Short Term Goal 1 (Week 1): Pt will transfer to [facility] with CGA and LRAD PT Short Term Goal 1 - Progress (Week 1): Met PT Short Term Goal 2 (Week 1): Pt will ambulate 52ft with mod assist and RW PT Short Term Goal 2 - Progress (Week 1): Met PT Short Term Goal 3 (Week 1): Pt will propel WC x 177ft without assist PT Short Term Goal 3 - Progress (Week 1): Met Week 2:  PT Short Term Goal 1 (Week 2): =LTGs d/t ELOS  Skilled Therapeutic Interventions/Progress Updates:    pt received in bed and agreeable to therapy. Pt reports 0/10 pain, premedicated. Supine>sit with supervision from flat bed. Pt changed sock on residual limb with set up. Donned limb guard with assist. ambulatory transfer to bathroom, small LOB at door, mod A to recover. Close supervision for 3/3 toileting tasks. Continued cueing for Sit to stand throughout session. Pt required as little as CGA at times and as much as mod A occ.Pt propelled w/c with BUE around unit while attempting to find moleskin for limb guard, including managing brakes and leg rests. Car transfer with CGA, VC for technique. Pt then directed in hops onto 1.75" step x 4. Small knee buckle on last rep with mod A to recover. Pt returned to room and remained in w/c, was left with all needs in reach and alarm active.   Therapy Documentatio. Precautions:  Precautions Precautions: Fall Precaution Comments: [name], anxiety Required Braces or Orthoses: Other Brace Other Brace: limb protector Restrictions Weight Bearing Restrictions: No LLE Weight Bearing: Non weight bearing    Therapy/Group: Individual Therapy  Mickel Fuchs 03/18/2021, 4:57 PM

## 2021-03-19 LAB — GLUCOSE, CAPILLARY
Glucose-Capillary: 92 mg/dL (ref 70–99)
Glucose-Capillary: 120 mg/dL — ABNORMAL HIGH (ref 70–99)
Glucose-Capillary: 126 mg/dL — ABNORMAL HIGH (ref 70–99)
Glucose-Capillary: 127 mg/dL — ABNORMAL HIGH (ref 70–99)

## 2021-03-19 NOTE — Progress Notes (Signed)
Recreational Therapy Discharge Summary Patient Details  Name: Alieyah Spader MRN: [id number] Date of Birth: 27-Feb-1976 Today's Date: 03/19/2021  Long term goals set: 1  Long term goals met: 1  Comments on progress toward goals: Pt is scheduled for discharge 12/13.  TR sessions focused on activity tolerance, dynamic sitting balance, UE strengthening, safety awareness and deep breathing techniques to assist in pain and stress management.  If items/activities are available and within reach, pt is Mod I w/c level for tasks.  Reasons goals not met: n/a  Equipment acquired: n/a  Reasons for discharge: discharge from hospital  Patient/family agrees with progress made and goals achieved: Yes  Afua Hoots 03/19/2021, 12:46 PM

## 2021-03-19 NOTE — Progress Notes (Signed)
Physical Therapy Session Note  Patient Details  Name: Lori Schmidt MRN: [id number] Date of Birth: December 05, 1975  Today's Date: 03/19/2021 PT Individual Time: 0800-0855 PT Individual Time Calculation (min): 55 min   Short Term Goals: Week 1:  PT Short Term Goal 1 (Week 1): Pt will transfer to [facility] with CGA and LRAD PT Short Term Goal 1 - Progress (Week 1): Met PT Short Term Goal 2 (Week 1): Pt will ambulate 19f with mod assist and RW PT Short Term Goal 2 - Progress (Week 1): Met PT Short Term Goal 3 (Week 1): Pt will propel WC x 1569fwithout assist PT Short Term Goal 3 - Progress (Week 1): Met Week 2:  PT Short Term Goal 1 (Week 2): =LTGs d/t ELOS Week 3:     Skilled Therapeutic Interventions/Progress Updates:   Pt initially supine.  Supine to sit w/supervision w/bed features.  Applies limbguard w/set up and min assist.  Sit to stand from elevated bed w/cga, assist to stabilize R foot/prevent sliding.   stand pivot transfer to wc w/cga only.  Transported to commode.  Commode transfer w/RW w/cga.  Stands and lowers/raises pants w/cga.  Independent w/toileting.  Short distance gait including navigating threshold w/cga, turn sit to wc, cga.  No balance loss w/all activities.  Limbguard did fall off during gait, repositioned by therapist and pt.  Pt propelled wc 7514f/additional time, cues for technique for navigating tight turns.  Stopped at [employer] to discuss equipment delivery and need to order RW/self pay.  Discussed recs of primary PT and pt agreed to research options and order w/primary tomorrow to ensure proper size/etc.  Pt Sit to stand w/cga and gait x 39f5fcluding 180* turn w/cga and brief standing rest breaks.   Propelled 100ft37froom including turning, backing wc w/cues. Pt left oob in wc w/alarm belt set and needs in reach    Therapy Documentation Precautions:  Precautions Precautions: Fall Precaution Comments: [name], anxiety Required Braces or  Orthoses: Other Brace Other Brace: limb protector Restrictions Weight Bearing Restrictions: No LLE Weight Bearing: Non weight bearing Exercises:   Other Treatments:      Therapy/Group: Individual Therapy BarbaCallie Fielding  [place]/2022, 12:23 PM

## 2021-03-19 NOTE — Progress Notes (Addendum)
Patient ID: Lori Schmidt, female   DOB: April 15, 1975, 45 y.o.   MRN: [id number] Met with pt to inform her insurance is still looking for home health agency to provide RN and PT services. Pt aware will need to purchase her own rolling walker due to wheelchair will be covered. Have messaged [employer] to deliver her wheelchair and drop-arm bedside commode to Mom's home since cant get home from the hospital. Pt is pleased how well she is doing with rehab. Check with insurance CM today regarding where they are with finding HHRN and PT coverage.  11:42 AM Spoke with [employer]-Stephanie and [employer] CM-Dana who report no PT or OT can be found to staff pt's case. Looking for [facility] for IV antibiotics. Will need to either go to OPPT and get [facility] at home or needs to be extended here. Insurance offered this option if has not reached goals here. Will get input from team and talk with pt  12:32 PM Met with pt and Mom pt really wants to go home on Tuesday and discuss OPPT closest to them is [facility]. Working on education for IV antibiotics with Pam-RN. Will work on discharge for Tuesday. [facility] has accepted RN referral will educate pt and Mom prior to DC Tuesday Have faxed OPPT to [facility] so can set up appointment with pt or Mom

## 2021-03-19 NOTE — Progress Notes (Signed)
PROGRESS NOTE   Subjective/Complaints:  Pt reports had a GREAT day yesterday- sat up in bedside chair til 9pm and sounds ike overdid it. As a result, is having back spasms this AM- woke her up.  [place] didn't work. Explained to psuh some every day, but don't overdo it- that's the most likely cause.  Feels much more confident about d/c next week- also c/o itching on Posterior L thigh.    ROS:   Pt denies SOB, abd pain, CP, N/V/C/D, and vision changes   Objective:   No results found. No results for input(s): WBC, HGB, HCT, PLT in the last 72 hours.  No results for input(s): NA, K, CL, CO2, GLUCOSE, BUN, CREATININE, CALCIUM in the last 72 hours.   Intake/Output Summary (Last 24 hours) at 03/19/2021 0846 Last data filed at 03/18/2021 1659 Gross per 24 hour  Intake 720 ml  Output --  Net 720 ml        Physical Exam: Vital Signs Blood pressure 118/76, pulse 97, temperature 98.4 F (36.9 C), resp. rate 18, height $RemoveBe'5\' 7"'MSuLhCUbG$  (1.702 m), weight 114.2 kg, SpO2 96 %.      General: awake, alert, appropriate, sitting up in bed; c/o back spasms; NAD HENT: conjugate gaze; oropharynx moist CV: borderline tachycardic  rate; no JVD Pulmonary: CTA B/L; no W/R/R- good air movement GI: soft, NT, ND, (+)BS- normoactive Psychiatric: appropriate- a little anxious about pain today- but still better than admission Neurological: Ox3 Musc: [name] looks great- some dried scabbing and staples intact- only a trace dog ear on medial aspect, but otherwise, they are resolved; Swelling 75% better- no drainage.  No change today Skin- some irritation and mild rawness on back of L middle thigh, however not open! Neuro: Alert Motor: Right lower extremity: 4+/5 proximal distal, stable Left lower extremity: Hip flexion with some limitations due to pain   Assessment/Plan: 1. Functional deficits which require 3+ hours per day of interdisciplinary therapy in  a comprehensive inpatient rehab setting. Physiatrist is providing close team supervision and 24 hour management of active medical problems listed below. Physiatrist and rehab team continue to assess barriers to discharge/monitor patient progress toward functional and medical goals  Care Tool:  Bathing    Body parts bathed by patient: Right arm, Left arm, Chest, Abdomen, Front perineal area, Right upper leg, Left upper leg, Face, Buttocks, Right lower leg   Body parts bathed by helper: Right lower leg Body parts n/a: Left lower leg   Bathing assist Assist Level: Contact Guard/Touching assist     Upper Body Dressing/Undressing Upper body dressing   What is the patient wearing?: Pull over shirt    Upper body assist Assist Level: Set up assist    Lower Body Dressing/Undressing Lower body dressing      What is the patient wearing?: Pants     Lower body assist Assist for lower body dressing: Moderate Assistance - Patient 50 - 74%     Toileting Toileting Toileting Activity did not occur Landscape architect and hygiene only):  (on BSC)  Toileting assist Assist for toileting: Minimal Assistance - Patient > 75%     Transfers Chair/bed transfer  Transfers assist  Chair/bed transfer assist level: Minimal Assistance - Patient > 75%     Locomotion Ambulation   Ambulation assist   Ambulation activity did not occur: Safety/medical concerns  Assist level: Minimal Assistance - Patient > 75% Assistive device: Walker-rolling Max distance: 25 ft   Walk 10 feet activity   Assist  Walk 10 feet activity did not occur: Safety/medical concerns  Assist level: Minimal Assistance - Patient > 75% Assistive device: Walker-rolling   Walk 50 feet activity   Assist Walk 50 feet with 2 turns activity did not occur: Safety/medical concerns         Walk 150 feet activity   Assist Walk 150 feet activity did not occur: Safety/medical concerns         Walk 10 feet on  uneven surface  activity   Assist Walk 10 feet on uneven surfaces activity did not occur: Safety/medical concerns         Wheelchair     Assist Is the patient using a wheelchair?: Yes Type of Wheelchair: Manual    Wheelchair assist level: Supervision/Verbal cueing Max wheelchair distance: >150'    Wheelchair 50 feet with 2 turns activity    Assist        Assist Level: Supervision/Verbal cueing   Wheelchair 150 feet activity     Assist      Assist Level: Supervision/Verbal cueing   Blood pressure 118/76, pulse 97, temperature 98.4 F (36.9 C), resp. rate 18, height $RemoveBe'5\' 7"'nmCtzmfne$  (1.702 m), weight 114.2 kg, SpO2 96 %.    Medical Problem List and Plan: 1. 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 D/c 12/13  2.  Impaired mobility: Continue Lovenox.  Venous Doppler is negative             -antiplatelet therapy: N/A 3. Pain from L4/5 discitis- Continue OxyContin sustained-release 10 mg every 12 hours,, Robaxin-750 milligram every 6 hours, Naprosyn 500 mg twice daily oxycodone as needed  12/8- will con't Lyrica 75 mg TID- pain doing much better- con't regimen  12/9- having more back spasms this AM- thinks she overdid it- however if still an issue tomorrow, will increase Lyrica.  4. Anxiety: Continue Celexa 40 mg daily, Ambien 5 mg nightly.  Provide emotional support             -antipsychotic agents: N/A 5. Neuropsych: This patient is capable of making decisions on her own behalf. 6. Skin/Wound Care: Routine skin checks 7. Fluids/Electrolytes/Nutrition: Routine in and outs 8.  ID/MSSA bacteremia.  Continue Ancef 2 g every 8 hours through 04/14/2021 and stop- per ID.   Normal  ESR/CRP, WBCs and diff Monday 9.  Acute blood loss anemia.    Hemoglobin 9.5 on 11/28, labs ordered for tomorrow  12/6- Hb 9.1-  stable- con't regimen 10.  Diabetes mellitus with peripheral neuropathy. A1c 13- had for 14 years;  NovoLog 8 units 3 times daily with meals, Levemir 55 units nightly, Tradjenta 5 mg daily, Glucophage 500 mg daily.  Check blood sugars before meals and at bedtime.  Diabetic teaching  CBG (last 3)  Recent Labs    03/18/21 1629 03/18/21 2124 03/19/21 0526  GLUCAP 134* 136* 92   12/9- CBGs great control- working on even when eats "treats' they have protein in them to help reduce CBGs. Con't regimen 11.  Hyperlipidemia.  Pravachol 12.  Obesity.  Dietary follow-up 13. L shoulder bicipital  tendinitis- suggest starting Voltaren gel 2 G QID  11/29- cannot do steroid injection due to lack of overall control of CBGs/A1c 13.   12/1- is tolerable- con't voltaren and current pain regimen 14. [name] Educated on avoiding contractures  12/7- will try and get a smaller limb guard- per PT, sometimes unsafe with transfers, so don't want to transfer without it.   12/8- got new limb guard  12/9- skin itching- encouraged her to put cloth between limb guard and L thigh where rubbed/irritated skin 18.  Hypoalbuminemia  Supplement initiated  12/9- f/u labs Monday  LOS: 14 days A FACE TO FACE EVALUATION WAS PERFORMED  Perley Arthurs 03/19/2021, 8:46 AM

## 2021-03-19 NOTE — Progress Notes (Signed)
Physical Therapy Session Note  Patient Details  Name: Lori Schmidt MRN: [id number] Date of Birth: 09-26-1975  Today's Date: 03/19/2021 PT Individual Time: 1020-1128 PT Individual Time Calculation (min): 68 min   Short Term Goals: Week 2:  PT Short Term Goal 1 (Week 2): =LTGs d/t ELOS  Skilled Therapeutic Interventions/Progress Updates: Pt presented in w/c with mother present agreeable to therapy. Mother present through entire session.  Pt states some pain in back but un rateable. Pt states anxious due to technically due to Depo shot but no longer has PCP and unsure what options are. PTA sent message to MD, PA, and LSW to ask if able to assist for guidance. Pt states feels comfortable with current level of function although doesn't feel like would hop 46f at home. Explained that our rehab role is to encourage to do more than would do at home so that can perform tasks more safely once home. Pt and mom verbalized understanding. Pt's mom concerned about bed mobility. Pt agreeable to perform in ADL apt. Pt propelled partial distance with pt's mom pushing intermittently to ADL apt. Pt was able to manage w/c part independently and performed stand pivot transfer to bed with CGA. Pt then performed sit to/from supine with supervision. Pt returned to w/c in same manner as prior then once properly set up with w/c parts (done mod I) propelled to rehab gym. Performed stand pivot to mat and demonstrated residual limb exercises including hip extension, hip abd/add (in prone), hamstring curl, and verbally indicated stays in prone for extended time for hip flexor stretch. Pt performed prone to sitting EOM with mod I and increased time. Pt then demonstrated LAQ which she states she does regularly. Pt then ambulated ~349fwith RW and CGA with w/c follow. Pt propelled remaining distance to room and remained in w/c. Pt left in w/c with call bell within reach, current needs met and mom present.      Therapy  Documentation Precautions:  Precautions Precautions: Fall Precaution Comments: [name], anxiety Required Braces or Orthoses: Other Brace Other Brace: limb protector Restrictions Weight Bearing Restrictions: No LLE Weight Bearing: Non weight bearing General:   Vital Signs: Therapy Vitals Temp: 98.6 F (37 C) Temp Source: Oral Pulse Rate: 100 Resp: 18 BP: 125/89 Patient Position (if appropriate): Sitting Oxygen Therapy SpO2: 100 % O2 Device: Room Air Pain:   Mobility:   Locomotion :    Trunk/Postural Assessment :    Balance:   Exercises:   Other Treatments:      Therapy/Group: Individual Therapy  Saba Gomm 03/19/2021, 4:32 PM

## 2021-03-20 LAB — GLUCOSE, CAPILLARY
Glucose-Capillary: 84 mg/dL (ref 70–99)
Glucose-Capillary: 88 mg/dL (ref 70–99)
Glucose-Capillary: 111 mg/dL — ABNORMAL HIGH (ref 70–99)
Glucose-Capillary: 151 mg/dL — ABNORMAL HIGH (ref 70–99)

## 2021-03-20 MED ORDER — PREGABALIN 50 MG PO CAPS
100.0000 mg | ORAL_CAPSULE | Freq: Three times a day (TID) | ORAL | Status: DC
  Administered 2021-03-20 – 2021-03-21 (×5): 100 mg via ORAL
  Filled 2021-03-20 (×5): qty 2

## 2021-03-20 NOTE — Progress Notes (Signed)
PROGRESS NOTE   Subjective/Complaints:  Pt reports back pain/spasms are still bothering her- since 2am- L posterior thigh bothersome- tight- it appears due to Shrinker- Confident about going home now; her mother is also confident.   LBM yesterday.   ROS:   Pt denies SOB, abd pain, CP, N/V/C/D, and vision changes     Objective:   No results found. No results for input(s): WBC, HGB, HCT, PLT in the last 72 hours.  No results for input(s): NA, K, CL, CO2, GLUCOSE, BUN, CREATININE, CALCIUM in the last 72 hours.   Intake/Output Summary (Last 24 hours) at 03/20/2021 1010 Last data filed at 03/19/2021 1823 Gross per 24 hour  Intake 720 ml  Output --  Net 720 ml        Physical Exam: Vital Signs Blood pressure 119/69, pulse 89, temperature 97.8 F (36.6 C), temperature source Oral, resp. rate 18, height _0  (1.702 m), weight 114.2 kg, SpO2 99 %.       General: awake, alert, appropriate, sitting up in bed; NAD HENT: conjugate gaze; oropharynx moist CV: regular rate; no JVD Pulmonary: CTA B/L; no W/R/R- good air movement GI: soft, NT, ND, (+)BS Psychiatric: appropriate- no anxiety seen today Neurological: Ox3-  Musc: [name] looks great- some dried scabs/blood seen- a little peeling of skin on Medial aspect; but otherwise, more shaping, less edema; staples intact Skin- some irritation and mild rawness on back of L middle thigh, however not open- no redness anymore- just line from shrinker and a little peeling skin- covered with foam Neuro: Alert Motor: Right lower extremity: 4+/5 proximal distal, stable Left lower extremity: Hip flexion with some limitations due to pain   Assessment/Plan: 1. Functional deficits which require 3+ hours per day of interdisciplinary therapy in a comprehensive inpatient rehab setting. Physiatrist is providing close team supervision and 24 hour management of active medical problems  listed below. Physiatrist and rehab team continue to assess barriers to discharge/monitor patient progress toward functional and medical goals  Care Tool:  Bathing    Body parts bathed by patient: Right arm, Left arm, Chest, Abdomen, Front perineal area, Right upper leg, Left upper leg, Face, Buttocks, Right lower leg   Body parts bathed by helper: Right lower leg Body parts n/a: Left lower leg   Bathing assist Assist Level: Supervision/Verbal cueing     Upper Body Dressing/Undressing Upper body dressing   What is the patient wearing?: Pull over shirt    Upper body assist Assist Level: Set up assist    Lower Body Dressing/Undressing Lower body dressing      What is the patient wearing?: Pants     Lower body assist Assist for lower body dressing: Contact Guard/Touching assist     Toileting Toileting Toileting Activity did not occur (Clothing management and hygiene only):  (on BSC)  Toileting assist Assist for toileting: Minimal Assistance - Patient > 75%     Transfers Chair/bed transfer  Transfers assist     Chair/bed transfer assist level: Minimal Assistance - Patient > 75%     Locomotion Ambulation   Ambulation assist   Ambulation activity did not occur: Safety/medical concerns  Assist level: Minimal Assistance -  Patient > 75% Assistive device: Walker-rolling Max distance: 25 ft   Walk 10 feet activity   Assist  Walk 10 feet activity did not occur: Safety/medical concerns  Assist level: Minimal Assistance - Patient > 75% Assistive device: Walker-rolling   Walk 50 feet activity   Assist Walk 50 feet with 2 turns activity did not occur: Safety/medical concerns         Walk 150 feet activity   Assist Walk 150 feet activity did not occur: Safety/medical concerns         Walk 10 feet on uneven surface  activity   Assist Walk 10 feet on uneven surfaces activity did not occur: Safety/medical concerns          Wheelchair     Assist Is the patient using a wheelchair?: Yes Type of Wheelchair: Manual    Wheelchair assist level: Supervision/Verbal cueing Max wheelchair distance: >150'    Wheelchair 50 feet with 2 turns activity    Assist        Assist Level: Supervision/Verbal cueing   Wheelchair 150 feet activity     Assist      Assist Level: Supervision/Verbal cueing   Blood pressure 119/69, pulse 89, temperature 97.8 F (36.6 C), temperature source Oral, resp. rate 18, height _0  (1.702 m), weight 114.2 kg, SpO2 99 %.    Medical Problem List and Plan: 1. 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  Impaired mobility: Continue Lovenox.  Venous Doppler is negative             -antiplatelet therapy: N/A 3. 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  4. Anxiety: Continue Celexa 40 mg daily, Ambien 5 mg nightly.  Provide emotional support             -antipsychotic agents: N/A 5. Neuropsych: This patient is capable of making decisions on her own behalf. 6. Skin/Wound Care: Routine skin checks 7. Fluids/Electrolytes/Nutrition: Routine in and outs 8.  ID/MSSA bacteremia.  Continue Ancef 2 g every 8 hours through 04/14/2021 and stop- per ID.   Normal  ESR/CRP, WBCs and diff Monday 9.  Acute blood loss anemia.    Hemoglobin 9.5 on 11/28, labs ordered for tomorrow  12/6- Hb 9.1- stable- con't regimen 10.  Diabetes mellitus with peripheral neuropathy. A1c 13- had for 14 years;  NovoLog 8 units 3 times daily with meals, Levemir 55 units nightly, Tradjenta 5 mg daily, Glucophage 500 mg daily.  Check  blood sugars before meals and at bedtime.  Diabetic teaching  CBG (last 3)  Recent Labs    03/19/21 1709 03/19/21 2108 03/20/21 0615  GLUCAP 126* 127* 84   12/10- CBGs doing great- con't regimen 11.  Hyperlipidemia.  Pravachol 12.  Obesity.  Dietary follow-up 13. L shoulder bicipital tendinitis- suggest starting Voltaren gel 2 G QID  11/29- cannot do steroid injection due to lack of overall control of CBGs/A1c 13.   12/1- is tolerable- con't voltaren and current pain regimen 14. [name] Educated on avoiding contractures  12/7- will try and get a smaller limb guard- per PT, sometimes unsafe with transfers, so don't want to  transfer without it.   12/8- got new limb guard  12/9- skin itching- encouraged her to put cloth between limb guard and L thigh where rubbed/irritated skin  12/10- skin looks better- likely having discomfort from  shrinker rubbing on back of L posterior thigh-  18.  Hypoalbuminemia  Supplement initiated  12/9- f/u labs Monday  LOS: 15 days A FACE TO FACE EVALUATION WAS PERFORMED  Lori Schmidt 03/20/2021, 10:10 AM

## 2021-03-20 NOTE — Progress Notes (Signed)
Occupational Therapy Session Note  Patient Details  Name: Lori Schmidt MRN: [id number] Date of Birth: 1975/12/09  Today's Date: 03/21/2021 OT Individual Time: 1345-1430 OT Individual Time Calculation (min): 45 min   Skilled Therapeutic Interventions/Progress Updates:    Pt greeted in the w/c with family present. They all had a few questions regarding grad day schedule, f/u OT, and d/c. These questions were answered and pts schedule written down. Pt wanted to work on sit<stands and short distance ambulation today. Supervision for sit<stands with device and also hopping to her doorway and then back to the w/c. Discussed pressure relief positioning when sitting up and when in bed, reviewed exercises to work on hip extension in prep for prosthetic, discussed importance of residual limb positioning/ROM in her daily routine. Family actively listening during our conversation, chiming in as appropriate. Pt donned her limb guard with supervision. Pt remained sitting up at close of session, appreciative of education, feels ready for d/c home this week and very proud of her progress at rehab, grateful for rehab team.  Therapy Documentation Precautions:  Precautions Precautions: Fall Precaution Comments: [name], anxiety Required Braces or Orthoses: Other Brace Other Brace: limb protector Restrictions Weight Bearing Restrictions: Yes LLE Weight Bearing: Non weight bearing  Pain: in back, RN in to provide pain medicine during session   ADL: ADL Eating: Not assessed Grooming: Setup Upper Body Bathing: Supervision/safety Lower Body Bathing: Minimal assistance Upper Body Dressing: Setup Lower Body Dressing: Maximal assistance Toileting: Not assessed Toilet Transfer: Moderate assistance Toilet Transfer Method: Stand pivot  Therapy/Group: Individual Therapy  Lavonda Thal A Mikiya Nebergall 03/21/2021, 4:02 PM

## 2021-03-21 LAB — GLUCOSE, CAPILLARY
Glucose-Capillary: 103 mg/dL — ABNORMAL HIGH (ref 70–99)
Glucose-Capillary: 113 mg/dL — ABNORMAL HIGH (ref 70–99)
Glucose-Capillary: 93 mg/dL (ref 70–99)
Glucose-Capillary: 117 mg/dL — ABNORMAL HIGH (ref 70–99)

## 2021-03-21 NOTE — Discharge Summary (Signed)
Physician Discharge Summary  Patient ID: Lori Schmidt MRN: [id number] DOB/AGE: [birth year] 45 y.o.  Admit date: 03/05/2021 Discharge date: 03/23/2021  Discharge Diagnoses:  Principal Problem:   S/P BKA (below knee amputation) unilateral, left (HCC) Active Problems:   Discitis of lumbar region   Left below-knee amputee (HCC)   Hypoalbuminemia due to protein-calorie malnutrition (HCC)   Diabetic peripheral neuropathy (HCC)   Acute blood loss anemia   Dyslipidemia ID/MSSA bacteremia Obesity Mood stabilization DVT prophylaxis   Discharged Condition: Stable  Significant Diagnostic Studies: MR Lumbar Schmidt W Wo Contrast  Result Date: 02/28/2021 CLINICAL DATA:  Low back pain, infection suspected EXAM: MRI LUMBAR Schmidt WITHOUT AND WITH CONTRAST TECHNIQUE: Multiplanar and multiecho pulse sequences of the lumbar Schmidt were obtained without and with intravenous contrast. CONTRAST:  46mL GADAVIST GADOBUTROL 1 MMOL/ML IV SOLN COMPARISON:  MRI 02/20/2021 FINDINGS: Segmentation: Standard segmentation is assumed. Same numbering system as utilized on the previous report. Alignment:  Physiologic. Vertebrae: Redemonstrated edema and enhancement involving the inferior endplate of L4 and superior endplate of L5. The degree of signal changes at the endplates has slightly progressed compared to the previous MRI. No definite endplate erosion. There are a few foci of T2 hyperintense signal again seen within the L4-5 disc. No definite epidural involvement. Remaining intervertebral discs are within normal limits. No fracture. There are a few scattered intraosseous hemangiomas. Conus medullaris and cauda equina: Conus extends to the L1 level. Conus and cauda equina appear normal. Paraspinal and other soft tissues: No paravertebral inflammatory changes. Preserved signal within the psoas muscles. No paravertebral fluid collections. Disc levels: T12-L1: Unremarkable. L1-L2: Unremarkable. L2-L3: Mild disc  bulge.  No foraminal or canal stenosis.  Unchanged. L3-L4: Mild diffuse disc bulge and mild bilateral facet arthropathy. No foraminal or canal stenosis. Unchanged. L4-L5: Diffuse disc bulge with small central/right paracentral protrusion. Mild bilateral facet arthropathy. Similar degree of subarticular recess stenosis, more pronounced on the right. Borderline-mild bilateral foraminal stenosis. No canal stenosis. Unchanged. L5-S1: Mild disc bulge without foraminal or canal stenosis. Unchanged. IMPRESSION: 1. Redemonstrated marrow edema and enhancement centered at the L4-5 level. The degree of signal changes at this level has slightly progressed compared to the previous MRI. No definite endplate erosion or definite epidural involvement. Given the interval progression of signal changes, findings are favored to represent changes of early discitis-osteomyelitis rather than discogenic endplate changes. 2. Unchanged mild multilevel degenerative changes of the lumbar Schmidt, as detailed above. Electronically Signed   By: Duanne Guess D.O.   On: 02/28/2021 10:12   ECHO TEE  Result Date: 02/23/2021    TRANSESOPHOGEAL ECHO REPORT   Patient Name:   Lori Schmidt Date of Exam: 02/23/2021 Medical Rec #:  [id number]             Height:       67.0 in Accession #:    [id number]            Weight:       267.2 lb Date of Birth:  [birth year]             BSA:          2.287 m Patient Age:    45 years              BP:           124/74 mmHg Patient Gender: F                     HR:  97 bpm. Exam Location:  Inpatient Procedure: Transesophageal Echo, Color Doppler and Cardiac Doppler Indications:     Bacteremia  History:         Patient has prior history of Echocardiogram examinations, most                  recent 02/18/2021. Risk Factors:Diabetes, Hypertension and                  Dyslipidemia.  Sonographer:     Bernadene Person RDCS Referring Phys:  [phone] Ria Comment B ROBERTS Diagnosing Phys: Lyman Bishop MD PROCEDURE:  After discussion of the risks and benefits of a TEE, an informed consent was obtained from the patient. The transesophogeal probe was passed without difficulty through the esophogus of the patient. Local oropharyngeal anesthetic was provided with Cetacaine. Sedation performed by different physician. The patient was monitored while under deep sedation. Anesthestetic sedation was provided intravenously by Anesthesiology: 206.04mg  of Propofol. The patient's vital signs; including heart rate, blood pressure, and oxygen saturation; remained stable throughout the procedure. The patient developed no complications during the procedure. IMPRESSIONS  1. Left ventricular ejection fraction, by estimation, is 60 to 65%. The left ventricle has normal function. There is mild left ventricular hypertrophy.  2. Right ventricular systolic function is normal. The right ventricular size is normal.  3. No left atrial/left atrial appendage thrombus was detected.  4. The mitral valve is abnormal. Trivial mitral valve regurgitation. There is mild late systolic prolapse of the middle scallop of the posterior leaflet of the mitral valve.  5. The aortic valve is tricuspid. Aortic valve regurgitation is not visualized. Conclusion(s)/Recommendation(s): No evidence of vegetation/infective endocarditis on this transesophageael echocardiogram. FINDINGS  Left Ventricle: Left ventricular ejection fraction, by estimation, is 60 to 65%. The left ventricle has normal function. The left ventricular internal cavity size was normal in size. There is mild left ventricular hypertrophy. Right Ventricle: The right ventricular size is normal. No increase in right ventricular wall thickness. Right ventricular systolic function is normal. Left Atrium: Left atrial size was normal in size. No left atrial/left atrial appendage thrombus was detected. Right Atrium: Right atrial size was normal in size. Pericardium: There is no evidence of pericardial effusion. Mitral  Valve: The mitral valve is abnormal. There is mild late systolic prolapse of the middle scallop of the posterior leaflet of the mitral valve. Trivial mitral valve regurgitation. Tricuspid Valve: The tricuspid valve is grossly normal. Tricuspid valve regurgitation is trivial. Aortic Valve: The aortic valve is tricuspid. Aortic valve regurgitation is not visualized. Pulmonic Valve: The pulmonic valve was normal in structure. Pulmonic valve regurgitation is not visualized. Aorta: The aortic root and ascending aorta are structurally normal, with no evidence of dilitation. IAS/Shunts: The interatrial septum is aneurysmal. No atrial level shunt detected by color flow Doppler. Lyman Bishop MD Electronically signed by Lyman Bishop MD Signature Date/Time: 02/23/2021/7:37:11 PM    Final    [place] EKG SITE RITE  Result Date: 02/25/2021 If Site Rite image not attached, placement could not be confirmed due to current cardiac rhythm.   Labs:  Basic Metabolic Panel: Recent Labs  Lab 03/22/21 0309  NA 139  K 3.6  CL 105  CO2 26  GLUCOSE 99  BUN 18  CREATININE 0.43*  CALCIUM 8.3*    CBC: Recent Labs  Lab 03/22/21 0309  WBC 5.9  NEUTROABS 3.2  HGB 8.9*  HCT 27.9*  MCV 88.0  PLT 231    CBG: Recent Labs  Lab 03/21/21 2104  03/22/21 0557 03/22/21 1206 03/22/21 1627 03/22/21 2127  GLUCAP 117* 106* 105* 82 131*   Family history.  Mother with hypothyroidism and diabetes.  Maternal grandmother with diabetes and breast cancer.  Denies any colon cancer esophageal cancer or rectal cancer  Brief HPI:   Lori Schmidt is a 45 y.o. right-handed female with history of hypertension hyperlipidemia obesity as well as diabetes mellitus quit smoking 10 years ago.  Per chart review lives with 31 year old daughter.  1 level home 5 steps to entry.  She works at [employer].  Independent prior to admission.  She does have a mother in the area.  Presented 02/17/2021 with increasing left lower extremity  swelling and decreased functional gait as well as spiked a low-grade fever with nausea vomiting and general malaise.  She denied any injury.  Patient had initially been seen at [facility] 02/11/2021 for left lower extremity swelling plan was for venous Doppler study however patient left AMA.  In the ED 02/16/2021 venous Dopplers completed left lower extremity showing no DVT and she received antibiotic therapy discharged home with blood cultures pending.  The following day patient was contacted blood cultures positive MSSA and she returned to the ED.  In the ED patient tachycardic 118 potassium 2.7 glucose 335 sodium 128 WBC 8600 hemoglobin 10.8 lactic acid 1.2.  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  Numerous peripherally enhancing joint effusions throughout the left midfoot.  Extensive tenosynovial fluid collections throughout the flexor and extensor tendons of the left foot compatible with infectious tenosynovitis.  Orthopedic service Dr. Sharol Given consulted patient underwent left transtibial amputation application of wound VAC 02/19/2021.  Maintained on antibiotic therapy with Ancef per infectious disease through 04/14/2021.  TEE completed showing no evidence of endocarditis.  Patient with persistent low back pain MRI completed 02/20/2021 showing edema L4-5 disc with adjacent inferior L4 and superior L5 endplate edema and enhancement concerning for discitis osteomyelitis and again repeated MRI 02/28/2021 redemonstrating marrow edema degree of signal change slightly progressed compared to previous MRI.  Scans reviewed by infectious disease recommendations were to continue antibiotic therapy as directed.  Subcutaneous Lovenox for DVT prophylaxis.  Acute blood loss anemia 9.7 and monitored.  Therapy evaluations completed due to patient decreased functional mobility was admitted for a comprehensive rehab program.   Hospital Course:  Lori Schmidt was admitted to rehab 03/05/2021 for inpatient therapies to consist of PT, ST and OT at least three hours five days a week. Past admission physiatrist, therapy team and rehab RN have worked together to provide customized collaborative inpatient rehab.  Pertaining to patient's left lower extremity osteomyelitis status post left BKA 02/19/2021/MSSA bacteremia/L4-L5 discitis.  Remained stable skin care as directed she would follow Dr. Sharol Given.  Subcutaneous Lovenox for DVT prophylaxis no bleeding episodes patient will continue Ancef 2 g every 8 hours through 04/14/2021 per infectious disease and stop.  Pain managed with use of OxyContin scheduled as well as Robaxin with Naprosyn twice daily oxycodone as needed with the addition of Lyrica as directed.  Mood stabilization with Celexa as well as Ambien.  Blood pressure controlled and Cozaar resumed.  Diabetes mellitus hemoglobin A1c 13 insulin therapy as directed full diabetic teaching she would need outpatient follow-up.  Pravachol ongoing for hyperlipidemia.  Obesity BMI 39.43 with dietary follow-up.   Blood pressures were monitored on TID basis and controlled  Diabetes has been monitored with ac/hs CBG checks and SSI was use  prn for tighter BS control.    Rehab course: During patient's stay in rehab weekly team conferences were held to monitor patient's progress, set goals and discuss barriers to discharge. At admission, patient required moderate assist stand pivot transfers minimal assist side-lying to sitting  Physical exam.  Blood pressure 111/70 pulse 96 temperature 98.2 respirations 18 oxygen saturation 97% room air Constitutional.  No acute distress HEENT Head.  Normocephalic and atraumatic Eyes.  Pupils round and reactive to light no discharge without nystagmus Neck.  Supple nontender no JVD without thyromegaly Cardiac regular rate rhythm any extra sounds or murmur heard Abdomen.  Soft nontender positive bowel sounds without  rebound Respiratory effort normal no respiratory distress without wheeze Musculoskeletal.  Normal range of motion Comments.  Upper extremities 5/5 bilateral Right lower extremity 5 -/5 in hip flexors knee extension knee flexors Left BKA with wound VAC Skin.  Left BKA wound VAC in place right upper extremity PICC line Neurologic.  Alert oriented follows commands decreased sensation light touch below knee right lower extremity  He/She  has had improvement in activity tolerance, balance, postural control as well as ability to compensate for deficits. He/She has had improvement in functional use RUE/LUE  and RLE/LLE as well as improvement in awareness.  Supine to sit supervision with bed features.  Applies limb guard to set up with minimal assist.  Sit to stand from elevated bed contact-guard.  Propels wheelchair supervision.  Short distance gait including navigating threshold contact-guard.  No balance loss.  Contact-guard for lateral scoot-TTB and then patient bathing seated level with supervision cues.  Dressing was completed sit to stand from TTB due to presence of company in her room.  Contact-guard for dynamic standing.  Full family teaching completed plan discharged to home       Disposition: Discharged home    Diet: Diabetic diet  Special Instructions: No driving smoking or alcohol  Continue intravenous Ancef 2 g every 8 hours through 04/14/2021 and stop  Medications at discharge 1.  Tylenol as needed 2.  Vitamin C 1000 mg p.o. daily 3.  Ancef 2 g every 8 hours through 04/14/2021 and stop 4.  Celexa 40 mg p.o. daily 5.  Voltaren 2 g 4 times daily 6.  Colace 100 mg p.o. daily 7.  NovoLog 8 units 3 times daily with meals 8.  Levemir 55 units nightly 9.  Tradjenta 5 mg p.o. daily 10.  Glucophage 1000 mg p.o. twice daily 11.  Robaxin-750 milligrams p.o. every 6 hours 12.  Naprosyn 500 mg p.o. twice daily 13.  Oxycodone 5 mg every 8 hours as needed severe pain 14.  OxyContin 10 mg  every 12 hours x1 week and stop..  Patient had declined due to insurance restraints 15.  MiraLAX daily hold for loose stools 16.  Pravachol 40 mg p.o. daily 17.  Lyrica 100 mg p.o. 2 times daily and 200 mg bedtime 18.  Vitamin B12 500 mcg p.o. every other day 19.  Cozaar 25 mg nightly  30-35 minutes were spent completing discharge summary and discharge planning  Discharge Instructions     [facility] pharmacist to adjust dose for Vancomycin, Aminoglycosides and other anti-infective therapies as requested by physician.   Complete by: As directed    [facility] to provide Cath Flo $Remove'2mg'xAIndMW$    Complete by: As directed    Administer for PICC line occlusion and as ordered by physician for other access device issues.   Ambulatory referral to Physical Medicine Rehab  Complete by: As directed    Moderate complexity follow-up 1 to 2 weeks left BKA   Anaphylaxis Kit: Provided to treat any anaphylactic reaction to the medication being provided to the patient if First Dose or when requested by physician   Complete by: As directed    Epinephrine 1mg /ml vial / amp: Administer 0.3mg  (0.32ml) subcutaneously once for moderate to severe anaphylaxis, nurse to call physician and pharmacy when reaction occurs and call 911 if needed for immediate care   Diphenhydramine 50mg /ml IV vial: Administer 25-50mg  IV/IM PRN for first dose reaction, rash, itching, mild reaction, nurse to call physician and pharmacy when reaction occurs   Sodium Chloride 0.9% NS 557ml IV: Administer if needed for hypovolemic blood pressure drop or as ordered by physician after call to physician with anaphylactic reaction   Change dressing on IV access line weekly and PRN   Complete by: As directed    Flush IV access with Sodium Chloride 0.9% and Heparin 10 units/ml or 100 units/ml   Complete by: As directed    Home infusion instructions - [facility]   Complete by: As directed    Instructions: Flush IV access  with Sodium Chloride 0.9% and Heparin 10units/ml or 100units/ml   Change dressing on IV access line: Weekly and PRN   Instructions Cath Flo 2mg : Administer for PICC Line occlusion and as ordered by physician for other access device   [facility] pharmacist to adjust dose for: Vancomycin, Aminoglycosides and other anti-infective therapies as requested by physician   Method of administration may be changed at the discretion of home infusion pharmacist based upon assessment of the patient and/or caregiver's ability to self-administer the medication ordered   Complete by: As directed    Outpatient Parenteral Antibiotic Therapy Information Antibiotic: Cefazolin (Ancef) IVPB; Indications for use: discitis/bacteremia; End Date: 04/14/2021   Complete by: As directed    Antibiotic: Cefazolin (Ancef) IVPB   Indications for use: discitis/bacteremia   End Date: 04/14/2021        Follow-up Information     Lovorn, Jinny Blossom, MD Follow up.   Specialty: Physical Medicine and Rehabilitation Why: Office to call for appointment Contact information: [address] [place] [address] [phone]         Newt Minion, MD Follow up.   Specialty: Orthopedic Surgery Why: Call for appointment Contact information: [place] [address] [phone]         Mignon Pine, DO Follow up.   Specialties: Infectious Diseases, Internal Medicine Why: Call for appointment Contact information: [place] [address] [phone]         Nena Polio, NP Follow up.   Specialty: Family Medicine Contact information: [address] [place] [address] [phone]                 Signed: Lavon Paganini Grinnell 03/23/2021, 5:11 AM

## 2021-03-22 ENCOUNTER — Other Ambulatory Visit (HOSPITAL_COMMUNITY): Payer: Self-pay

## 2021-03-22 LAB — CBC WITH DIFFERENTIAL/PLATELET
Abs Immature Granulocytes: 0.01 10*3/uL (ref 0.00–0.07)
Basophils Absolute: 0 10*3/uL (ref 0.0–0.1)
Basophils Relative: 0 %
Eosinophils Absolute: 0.9 10*3/uL — ABNORMAL HIGH (ref 0.0–0.5)
Eosinophils Relative: 16 %
HCT: 27.9 % — ABNORMAL LOW (ref 36.0–46.0)
Hemoglobin: 8.9 g/dL — ABNORMAL LOW (ref 12.0–15.0)
Immature Granulocytes: 0 %
Lymphocytes Relative: 22 %
Lymphs Abs: 1.3 10*3/uL (ref 0.7–4.0)
MCH: 28.1 pg (ref 26.0–34.0)
MCHC: 31.9 g/dL (ref 30.0–36.0)
MCV: 88 fL (ref 80.0–100.0)
Monocytes Absolute: 0.4 10*3/uL (ref 0.1–1.0)
Monocytes Relative: 7 %
Neutro Abs: 3.2 10*3/uL (ref 1.7–7.7)
Neutrophils Relative %: 55 %
Platelets: 231 10*3/uL (ref 150–400)
RBC: 3.17 MIL/uL — ABNORMAL LOW (ref 3.87–5.11)
RDW: 13.5 % (ref 11.5–15.5)
WBC: 5.9 10*3/uL (ref 4.0–10.5)
nRBC: 0 % (ref 0.0–0.2)

## 2021-03-22 LAB — COMPREHENSIVE METABOLIC PANEL
ALT: 7 U/L (ref 0–44)
AST: 14 U/L — ABNORMAL LOW (ref 15–41)
Albumin: 2.7 g/dL — ABNORMAL LOW (ref 3.5–5.0)
Alkaline Phosphatase: 75 U/L (ref 38–126)
Anion gap: 8 (ref 5–15)
BUN: 18 mg/dL (ref 6–20)
CO2: 26 mmol/L (ref 22–32)
Calcium: 8.3 mg/dL — ABNORMAL LOW (ref 8.9–10.3)
Chloride: 105 mmol/L (ref 98–111)
Creatinine, Ser: 0.43 mg/dL — ABNORMAL LOW (ref 0.44–1.00)
GFR, Estimated: >60 mL/min (ref >60)
Glucose, Bld: 99 mg/dL (ref 70–99)
Potassium: 3.6 mmol/L (ref 3.5–5.1)
Sodium: 139 mmol/L (ref 135–145)
Total Bilirubin: 0.4 mg/dL (ref 0.3–1.2)
Total Protein: 5.8 g/dL — ABNORMAL LOW (ref 6.5–8.1)

## 2021-03-22 LAB — GLUCOSE, CAPILLARY
Glucose-Capillary: 106 mg/dL — ABNORMAL HIGH (ref 70–99)
Glucose-Capillary: 105 mg/dL — ABNORMAL HIGH (ref 70–99)
Glucose-Capillary: 82 mg/dL (ref 70–99)
Glucose-Capillary: 131 mg/dL — ABNORMAL HIGH (ref 70–99)

## 2021-03-22 LAB — C-REACTIVE PROTEIN: CRP: 0.7 mg/dL (ref <1.0)

## 2021-03-22 LAB — SEDIMENTATION RATE: Sed Rate: 15 mm/hr (ref 0–22)

## 2021-03-22 MED ORDER — INSULIN LISPRO (1 UNIT DIAL) 100 UNIT/ML (KWIKPEN)
8.0000 [IU] | PEN_INJECTOR | Freq: Three times a day (TID) | SUBCUTANEOUS | 11 refills | Status: DC
Start: 2021-03-22 — End: 2021-11-10
  Filled 2021-03-22: qty 6, 25d supply, fill #0

## 2021-03-22 MED ORDER — PANTOPRAZOLE SODIUM 40 MG PO TBEC
40.0000 mg | DELAYED_RELEASE_TABLET | Freq: Every day | ORAL | 0 refills | Status: DC
  Filled 2021-03-22: qty 30, 30d supply, fill #0

## 2021-03-22 MED ORDER — PREGABALIN 75 MG PO CAPS
200.0000 mg | ORAL_CAPSULE | Freq: Every day | ORAL | Status: DC
  Administered 2021-03-22: 200 mg via ORAL
  Filled 2021-03-22: qty 1

## 2021-03-22 MED ORDER — DOCUSATE SODIUM 100 MG PO CAPS: 100.0000 mg | ORAL_CAPSULE | Freq: Every day | ORAL | 0 refills | Status: DC

## 2021-03-22 MED ORDER — ACETAMINOPHEN 325 MG PO TABS: 325.0000 mg | ORAL_TABLET | Freq: Four times a day (QID) | ORAL | Status: DC | PRN

## 2021-03-22 MED ORDER — PREGABALIN 100 MG PO CAPS
100.0000 mg | ORAL_CAPSULE | Freq: Three times a day (TID) | ORAL | 0 refills | Status: DC
  Filled 2021-03-22: qty 90, 30d supply, fill #0

## 2021-03-22 MED ORDER — OXYCODONE HCL 5 MG PO TABS
5.0000 mg | ORAL_TABLET | Freq: Three times a day (TID) | ORAL | 0 refills | Status: DC | PRN
  Filled 2021-03-22: qty 21, 7d supply, fill #0

## 2021-03-22 MED ORDER — LOSARTAN POTASSIUM 25 MG PO TABS
25.0000 mg | ORAL_TABLET | Freq: Every day | ORAL | 0 refills | Status: DC
  Filled 2021-03-22: qty 30, 30d supply, fill #0

## 2021-03-22 MED ORDER — POLYETHYLENE GLYCOL 3350 17 G PO PACK: 17.0000 g | PACK | Freq: Every day | ORAL | 0 refills | Status: DC

## 2021-03-22 MED ORDER — CEFAZOLIN IV (FOR PTA / DISCHARGE USE ONLY): 2.0000 g | Freq: Three times a day (TID) | INTRAVENOUS | 0 refills | Status: DC

## 2021-03-22 MED ORDER — METFORMIN HCL 1000 MG PO TABS
1000.0000 mg | ORAL_TABLET | Freq: Two times a day (BID) | ORAL | 0 refills | Status: DC
  Filled 2021-03-22: qty 60, 30d supply, fill #0

## 2021-03-22 MED ORDER — PREGABALIN 100 MG PO CAPS
ORAL_CAPSULE | ORAL | 0 refills | Status: DC
  Filled 2021-03-22: qty 120, 30d supply, fill #0

## 2021-03-22 MED ORDER — CHLORHEXIDINE GLUCONATE CLOTH 2 % EX PADS
6.0000 | MEDICATED_PAD | Freq: Two times a day (BID) | CUTANEOUS | Status: DC
  Administered 2021-03-22 – 2021-03-23 (×2): 6 via TOPICAL

## 2021-03-22 MED ORDER — NAPROXEN 500 MG PO TABS
500.0000 mg | ORAL_TABLET | Freq: Two times a day (BID) | ORAL | 0 refills | Status: AC
  Filled 2021-03-22: qty 14, 7d supply, fill #0

## 2021-03-22 MED ORDER — OXYCODONE HCL ER 10 MG PO T12A
10.0000 mg | EXTENDED_RELEASE_TABLET | Freq: Two times a day (BID) | ORAL | 0 refills | Status: DC
  Filled 2021-03-22: qty 14, 7d supply, fill #0

## 2021-03-22 MED ORDER — CEFAZOLIN SODIUM-DEXTROSE 2-4 GM/100ML-% IV SOLN: 2.0000 g | Freq: Three times a day (TID) | INTRAVENOUS | Status: DC

## 2021-03-22 MED ORDER — CYANOCOBALAMIN 1000 MCG PO TABS
500.0000 ug | ORAL_TABLET | Freq: Every day | ORAL | 0 refills | Status: DC
  Filled 2021-03-22: qty 15, 30d supply, fill #0

## 2021-03-22 MED ORDER — PRAVASTATIN SODIUM 40 MG PO TABS
40.0000 mg | ORAL_TABLET | Freq: Every day | ORAL | 0 refills | Status: DC
  Filled 2021-03-22: qty 30, 30d supply, fill #0

## 2021-03-22 MED ORDER — PREGABALIN 50 MG PO CAPS
100.0000 mg | ORAL_CAPSULE | Freq: Two times a day (BID) | ORAL | Status: DC
  Administered 2021-03-22 – 2021-03-23 (×2): 100 mg via ORAL
  Filled 2021-03-22 (×2): qty 2

## 2021-03-22 MED ORDER — DICLOFENAC SODIUM 1 % EX GEL
2.0000 g | Freq: Four times a day (QID) | CUTANEOUS | 0 refills | Status: DC
  Filled 2021-03-22: qty 100, 12d supply, fill #0

## 2021-03-22 MED ORDER — METHOCARBAMOL 750 MG PO TABS
750.0000 mg | ORAL_TABLET | Freq: Four times a day (QID) | ORAL | 0 refills | Status: DC
  Filled 2021-03-22: qty 60, 15d supply, fill #0

## 2021-03-22 MED ORDER — LINAGLIPTIN 5 MG PO TABS
5.0000 mg | ORAL_TABLET | Freq: Every day | ORAL | 0 refills | Status: DC
  Filled 2021-03-22: qty 30, 30d supply, fill #0

## 2021-03-22 MED ORDER — CITALOPRAM HYDROBROMIDE 40 MG PO TABS
40.0000 mg | ORAL_TABLET | Freq: Every day | ORAL | 0 refills | Status: DC
  Filled 2021-03-22: qty 30, 30d supply, fill #0

## 2021-03-22 MED ORDER — ASCORBIC ACID 1000 MG PO TABS
1000.0000 mg | ORAL_TABLET | Freq: Every day | ORAL | 0 refills | Status: DC
  Filled 2021-03-22: qty 30, 30d supply, fill #0

## 2021-03-22 MED ORDER — INSULIN PEN NEEDLE 32G X 4 MM MISC
0 refills | Status: DC
  Filled 2021-03-22: qty 100, 25d supply, fill #0

## 2021-03-22 MED ORDER — MORPHINE SULFATE ER 15 MG PO TBCR
15.0000 mg | EXTENDED_RELEASE_TABLET | Freq: Two times a day (BID) | ORAL | Status: DC
  Administered 2021-03-22 – 2021-03-23 (×3): 15 mg via ORAL
  Filled 2021-03-22 (×3): qty 1

## 2021-03-22 MED ORDER — DAPAGLIFLOZIN PROPANEDIOL 5 MG PO TABS
5.0000 mg | ORAL_TABLET | Freq: Every day | ORAL | 0 refills | Status: DC
  Filled 2021-03-22: qty 30, 30d supply, fill #0

## 2021-03-22 MED ORDER — INSULIN DETEMIR 100 UNIT/ML FLEXPEN
55.0000 [IU] | PEN_INJECTOR | Freq: Every day | SUBCUTANEOUS | 11 refills | Status: DC
  Filled 2021-03-22: qty 15, 27d supply, fill #0

## 2021-03-22 MED ORDER — MAGNESIUM OXIDE 400 MG PO TABS
400.0000 mg | ORAL_TABLET | Freq: Every day | ORAL | 0 refills | Status: DC
  Filled 2021-03-22: qty 30, 30d supply, fill #0

## 2021-03-22 NOTE — Progress Notes (Signed)
Inpatient Rehabilitation Discharge Medication Review by a Pharmacist  A complete drug regimen review was completed for this patient to identify any potential clinically significant medication issues.  High Risk Drug Classes Is patient taking? Indication by Medication  Antipsychotic No   Anticoagulant Yes Lovenox 60mg  [place] q24h for VTE prophylaxis for inpt only  Antibiotic Yes, as an intravenous medication Cefazolin - discitis through 04/14/2021 (8 weeks total therapy)  Opioid Yes Oxycontin + oxycodone PRN - pain  Antiplatelet No   Hypoglycemics/insulin Yes Insulin, metformin, Tradjenta - T2DM  Vasoactive Medication Yes Losartan - HTN  Chemotherapy No   Other Yes Celexa - MDD/Anxiety; Pravachol - Dyslipidemia Naproxen, Voltaren gel, Robaxin - pain Protonix - GERD Colace + Miralax - constipation     Type of Medication Issue Identified Description of Issue Recommendation(s)  Drug Interaction(s) (clinically significant)     Duplicate Therapy     Allergy     No Medication Administration End Date     Incorrect Dose     Additional Drug Therapy Needed     Significant med changes from prior encounter (inform family/care partners about these prior to discharge).    Other       Clinically significant medication issues were identified that warrant physician communication and completion of prescribed/recommended actions by midnight of the next day:  No  Name of provider notified for urgent issues identified:   Provider Method of Notification:     Pharmacist comments: Please change to Januvia upon discharge from CIR (see above)  Time spent performing this drug regimen review (minutes):  30  06/12/2021, PharmD, [place], AAHIVP, CPP Infectious Disease Pharmacist 03/22/2021 9:23 AM

## 2021-03-22 NOTE — Progress Notes (Signed)
PROGRESS NOTE   Subjective/Complaints:  Pt reports bloating pain due to cycle- wants to start Depoprovera, however doesn't have PCP appt til May 2023.  Been up since 2am due to muscle spasms- Lyrica helping, but not as helpful through entire night.   Since insurance usually won't pay for Oxycontin, will switch to MS Contin 15 mg BID- also wants to leave tomorrow at 9am.  Will increase lyrica to 100/100/200 mg    ROS:   Pt denies SOB, abd pain, CP, N/V/C/D, and vision changes     Objective:   No results found. Recent Labs    03/22/21 0309  WBC 5.9  HGB 8.9*  HCT 27.9*  PLT 231    Recent Labs    03/22/21 0309  NA 139  K 3.6  CL 105  CO2 26  GLUCOSE 99  BUN 18  CREATININE 0.43*  CALCIUM 8.3*     Intake/Output Summary (Last 24 hours) at 03/22/2021 1424 Last data filed at 03/22/2021 1400 Gross per 24 hour  Intake 956 ml  Output --  Net 956 ml        Physical Exam: Vital Signs Blood pressure 114/78, pulse 94, temperature 98.1 F (36.7 C), temperature source Oral, resp. rate 18, height _0  (1.702 m), weight 117.2 kg, SpO2 100 %.        General: awake, alert, appropriate, laying supine in bed; OT in room; NAD HENT: conjugate gaze; oropharynx moist CV: regular rate; no JVD Pulmonary: CTA B/L; no W/R/R- good air movement GI: soft, NT, ND, (+)BS Psychiatric: appropriate; bright affect Neurological: Ox3-  Musc: [name] looks great- some dried scabs/blood seen- a little peeling of skin on Medial aspect; but otherwise, more shaping, less edema; staples intact Skin- some irritation and mild rawness on back of L middle thigh, however not open- no redness anymore- just line from shrinker and a little peeling skin- covered with foam Neuro: Alert Motor: Right lower extremity: 4+/5 proximal distal, stable Left lower extremity: Hip flexion with some limitations due to pain   Assessment/Plan: 1.  Functional deficits which require 3+ hours per day of interdisciplinary therapy in a comprehensive inpatient rehab setting. Physiatrist is providing close team supervision and 24 hour management of active medical problems listed below. Physiatrist and rehab team continue to assess barriers to discharge/monitor patient progress toward functional and medical goals  Care Tool:  Bathing    Body parts bathed by patient: Right arm, Left arm, Chest, Abdomen, Front perineal area, Right upper leg, Left upper leg, Face, Buttocks, Right lower leg   Body parts bathed by helper: Right lower leg Body parts n/a: Left lower leg   Bathing assist Assist Level: Supervision/Verbal cueing     Upper Body Dressing/Undressing Upper body dressing   What is the patient wearing?: Pull over shirt    Upper body assist Assist Level: Set up assist    Lower Body Dressing/Undressing Lower body dressing      What is the patient wearing?: Pants     Lower body assist Assist for lower body dressing: Contact Guard/Touching assist     Toileting Toileting Toileting Activity did not occur (Clothing management and hygiene only):  (on  BSC)  Toileting assist Assist for toileting: Minimal Assistance - Patient > 75%     Transfers Chair/bed transfer  Transfers assist     Chair/bed transfer assist level: Minimal Assistance - Patient > 75%     Locomotion Ambulation   Ambulation assist   Ambulation activity did not occur: Safety/medical concerns  Assist level: Minimal Assistance - Patient > 75% Assistive device: Walker-rolling Max distance: 25 ft   Walk 10 feet activity   Assist  Walk 10 feet activity did not occur: Safety/medical concerns  Assist level: Minimal Assistance - Patient > 75% Assistive device: Walker-rolling   Walk 50 feet activity   Assist Walk 50 feet with 2 turns activity did not occur: Safety/medical concerns         Walk 150 feet activity   Assist Walk 150 feet  activity did not occur: Safety/medical concerns         Walk 10 feet on uneven surface  activity   Assist Walk 10 feet on uneven surfaces activity did not occur: Safety/medical concerns         Wheelchair     Assist Is the patient using a wheelchair?: Yes Type of Wheelchair: Manual    Wheelchair assist level: Supervision/Verbal cueing Max wheelchair distance: >150'    Wheelchair 50 feet with 2 turns activity    Assist        Assist Level: Supervision/Verbal cueing   Wheelchair 150 feet activity     Assist      Assist Level: Supervision/Verbal cueing   Blood pressure 114/78, pulse 94, temperature 98.1 F (36.7 C), temperature source Oral, resp. rate 18, height _0  (1.702 m), weight 117.2 kg, SpO2 100 %.    Medical Problem List and Plan: 1. 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  Venous Doppler is negative             -antiplatelet therapy: N/A 3. 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   12/12- made Lyrica changes as above- sned home on MS contin 15 mg BID not oxycontin.  4. Anxiety: Continue Celexa 40 mg daily, Ambien 5 mg nightly.  Provide emotional support             -antipsychotic agents: N/A 5. Neuropsych: This patient is capable of making decisions on her own behalf. 6. Skin/Wound Care: Routine skin checks 7. Fluids/Electrolytes/Nutrition: Routine in and outs 8.  ID/MSSA bacteremia.  Continue Ancef 2 g every 8 hours through 04/14/2021 and stop- per ID.   Normal  ESR/CRP, WBCs and diff  Monday  12/12- CRP 0.7 and ESR 15- doing great- 9.  Acute blood loss anemia.    Hemoglobin 9.5 on 11/28, labs ordered for tomorrow  12/6- Hb 9.1- stable- con't regimen 10.  Diabetes mellitus with peripheral neuropathy. A1c 13- had for 14 years;  NovoLog 8 units 3 times daily with meals, Levemir 55 units nightly, Tradjenta 5 mg daily, Glucophage 500 mg daily.  Check blood sugars before meals and at bedtime.  Diabetic teaching  CBG (last 3)  Recent Labs    03/21/21  2104 03/22/21 0557 03/22/21 1206  GLUCAP 117* 106* 105*   12/10- CBGs doing great- con't regimen  12/12- going home on current regimen- knows how to use insulin and check CBGs 11.  Hyperlipidemia.  Pravachol 12.  Obesity.  Dietary follow-up 13. L shoulder bicipital tendinitis- suggest starting Voltaren gel 2 G QID  11/29- cannot do steroid injection due to lack of overall control of CBGs/A1c 13.   12/1- is tolerable- con't voltaren and current pain regimen 14. [name] Educated on avoiding contractures  12/7- will try and get a smaller limb guard- per PT, sometimes unsafe with transfers, so don't want to transfer without it.   12/8- got new limb guard  12/9- skin itching- encouraged her to put cloth between limb guard and L thigh where rubbed/irritated skin  12/10- skin looks better- likely having discomfort from  shrinker rubbing on back of L posterior thigh-  18.  Hypoalbuminemia  Supplement initiated  12/9- f/u labs Monday  12/12- stable- con't supplements.  LOS: 17 days A FACE TO FACE EVALUATION WAS PERFORMED  Seichi Kaufhold 03/22/2021, 2:24 PM

## 2021-03-22 NOTE — Progress Notes (Signed)
Occupational Therapy Session Note  Patient Details  Name: Lori Schmidt MRN: [id number] Date of Birth: 1975/06/10  Today's Date: 03/22/2021 OT Individual Time: 4098-1191 and 4782-9562 OT Individual Time Calculation (min): 58 min and 55 min   Short Term Goals: Week 1:  OT Short Term Goal 1 (Week 1): Pt will perform BSC/toilet transfer with CGA and LRAD OT Short Term Goal 1 - Progress (Week 1): Progressing toward goal OT Short Term Goal 2 (Week 1): Pt will perform LB dress in most appropriate environment CGA OT Short Term Goal 2 - Progress (Week 1): Progressing toward goal OT Short Term Goal 3 (Week 1): Pt will perform sit <> stands at LRAD consistently with CGA in prep for ADL OT Short Term Goal 3 - Progress (Week 1): Progressing toward goal OT Short Term Goal 4 (Week 1): Pt will don limb guard with Supervision OT Short Term Goal 4 - Progress (Week 1): Met Week 2:  OT Short Term Goal 1 (Week 2): STGs = LTGs 2/2 ELOS   Skilled Therapeutic Interventions/Progress Updates:    Session 1: Pt greeted at time of session semireclined in bed, no back pain at this time and agreeable to OT session. Pt with some back stiffness/pain with mobility and sitting up later in session but did not rate and able to participate. MD visit at beginning of session, reinforced importance of follow up after DC from hospital and reviewed with pt plan for tomorrow for family to pick up, time of DC, etc. Pt politely declined ADL this AM planning for shower this afternoon for PM session. Focus of session on limb loss education, donning/doffing limb guard and compression sock, desensitization techniques, and how to cover items needed for showering. Pt performing bed mobility Supervision to sit EOB and needing assist for donning limb guard and shrinker sock 2/2 back discomfort and feeling "tight" this am. Educated pt on where to purchase TTB and reviewed layout of bathroom for option fit as current built in bench at  mothers house will not be an option. Will provide hand out later as well. Stand pivot bed > wheelchair Supervision and self propel to sink for oral hygiene/grooming tasks. Set up alarm on call bell in reach.    Session 2: Pt greeted at time of session sitting up in wheelchair no pain at rest, wanting to take a shower and perform ADL routine. Self propel to bathroom, stand pivot wheelchair <> TTB in shower with Supervision. Doffed clothing seated on bench at sit <> stand level and lateral leans. Encouraged pt to perform as she would at home simulated. OT assisting with covering IV site requiring 2 hands but pt assisting with covering and waterproofing residual limb with waterproof dressing, pt with good recall from demonstration and last shower. UB/LB bathing set up/supervision on bench without physical assist. Dried off seated and therapist assist only to dry R foot 2/2 back discomfort and not wanting to cause a flare up. Donned shirt set up, donned underwear and pants Supervision at sit > stand prior to pivot back to wheelchair. Set up at sink for oral hygiene and grooming tasks. Note discussion throughout session for planning ahead during ADL, task simplication, etc. Also provided hand out for TTB, where to purchase, and how to place in shower at home. Pt up in wheelchair alarm on call bell in reach.   Therapy Documentation Precautions:  Precautions Precautions: Fall Precaution Comments: [name], anxiety Required Braces or Orthoses: Other Brace Other Brace: limb protector Restrictions Weight  Bearing Restrictions: Yes LLE Weight Bearing: Non weight bearing    Therapy/Group: Individual Therapy  [name] 03/22/2021, 7:13 AM

## 2021-03-22 NOTE — Progress Notes (Signed)
Physical Therapy Discharge Summary  Patient Details  Name: Lori Schmidt MRN: [id number] Date of Birth: 1975-12-13  Today's Date: 03/22/2021 PT Individual Time: 0915-1030 PT Individual Time Calculation (min): 75 min    Patient has met 9 of 10 long term goals due to improved activity tolerance, improved balance, increased strength, decreased pain, and ability to compensate for deficits.  Patient to discharge at a wheelchair level Supervision gait and mod I w/c.   Patient's care partner is independent to provide the necessary physical assistance at discharge. Pt to d/c to her mother's house, who has participated in family education and demoed appropriate guarding assist as needed. Pt issued extensive HEP as she is unsure if she can afford co pays for follow up OP therapy.   Reasons goals not met: Pt requires supervision for bed chair transfers as she inconsistently requires cueing. Pt surpassed car transfer goal  Recommendation:  Patient will benefit from ongoing skilled PT services in outpatient setting to continue to advance safe functional mobility, address ongoing impairments in strength, functional mobility, balance, and minimize fall risk.  Equipment: RW and 20x18 w/c  Reasons for discharge: treatment goals met and discharge from hospital  Patient/family agrees with progress made and goals achieved: Yes  Skilled Therapeutic Interventions/Progress Updates:  Pt seated in w/c on arrival and agreeable to therapy. No complaint of pain. Pt propelled w/c with BUE throughout session including managing w/c leg rests, with occ assist from her sister to conserve energy. Session focused on assessing mobility for d/c. Pt demoed car transfer with supervision and propelled w/c up/down ramp with supervision. Pt demoed mod I bed mobility in ADL apartment, including supervision Stand pivot transfer with RW. Discussed d/c planning and pt's plan for PT follow up. Pt is unsure if she will be able to  afford frequent co pays. Pt educated to share her concerns with her OPPT and discuss less frequent visits with intensive HEP. Educated pt on importance of HEP if she goes this route. Pt then provided HEP as follows, with instructions for progression if needed. Pt returned to room and remained in w/c with her sister present.   Access Code: [email or web address] URL: [email or web address] Date: 03/22/2021 Prepared by: Ailene Rud  Exercises Prone Gluteal Sets - 1 x daily - 7 x weekly - 2 sets - 20 reps Supine Quad Set - 1 x daily - 7 x weekly - 2 sets - 20 reps Seated Long Arc Quad - 1 x daily - 7 x weekly - 3 sets - 12 reps Prone Hip Extension with Residual Limb (BKA) - 1 x daily - 7 x weekly - 3 sets - 10 reps Beginner Bridge - 1 x daily - 7 x weekly - 3 sets - 10 reps Supine Single Leg Bridge with Sound Leg (BKA) - 1 x daily - 7 x weekly - 3 sets - 10 reps Sidelying Isometric Hip Adduction (BKA) - 1 x daily - 7 x weekly - 3 sets - 10 reps - 10 hold Sidelying HIp Circles (BKA) - 1 x daily - 7 x weekly - 3 sets - 10 reps Sidelying Hip Abduction (BKA) - 1 x daily - 7 x weekly - 3 sets - 10 reps Prone Lying with Towel Roll (Hip Flexor Stretch) (BKA) - 1 x daily - 7 x weekly - 3 sets - 10 reps Straight Leg Raise - 1 x daily - 7 x weekly - 3 sets - 10 reps Standing Hip Extension with Counter Support - 1 x daily -  7 x weekly - 3 sets - 10 reps Heel Raises with Counter Support - 1 x daily - 7 x weekly - 3 sets - 10 reps Standing Hip Abduction with Counter Support - 1 x daily - 7 x weekly - 3 sets - 10 reps Squat with Chair and Counter Support - 1 x daily - 7 x weekly - 4-6 sets - 6-8 reps    PT Discharge Precautions/Restrictions Precautions Precautions: Fall Precaution Comments: [name], anxiety Required Braces or Orthoses: Other Brace Other Brace: limb protector Restrictions Weight Bearing Restrictions: Yes LLE Weight Bearing: Non weight bearing  Pain Pain Assessment Pain Scale:  0-10 Pain Score: 0-No pain Pain Interference Pain Interference Pain Effect on Sleep: 2. Occasionally Pain Interference with Therapy Activities: 2. Occasionally Pain Interference with Day-to-Day Activities: 2. Occasionally Vision/Perception  Vision - History Ability to See in Adequate Light: 1 Impaired Perception Perception: Within Functional Limits Praxis Praxis: Intact  Cognition Overall Cognitive Status: Within Functional Limits for tasks assessed Arousal/Alertness: Awake/alert Orientation Level: Oriented X4 Year: 2022 Month: December Day of Week: Correct Memory: Appears intact Awareness: Appears intact Problem Solving: Appears intact Safety/Judgment: Appears intact Sensation Sensation Light Touch: Impaired Detail Peripheral sensation comments: peripheral neuropathy on the RLE. Light Touch Impaired Details: Impaired RLE;Impaired LLE Proprioception: Appears Intact Coordination Gross Motor Movements are Fluid and Coordinated: No Fine Motor Movements are Fluid and Coordinated: Yes Coordination and Movement Description: grossly impaired 2/2 BKA Motor  Motor Motor: Within Functional Limits Motor - Skilled Clinical Observations: generalized weakness Motor - Discharge Observations: improved from baseline  Mobility Bed Mobility Bed Mobility: Sit to Supine Rolling Right: Independent Rolling Left: Independent Supine to Sit: Independent with assistive device Sit to Supine: Independent with assistive device Transfers Transfers: Sit to Stand;Stand to Sit;Stand Pivot Transfers Sit to Stand: Supervision/Verbal cueing Stand to Sit: Supervision/Verbal cueing Stand Pivot Transfers: Supervision/Verbal cueing Stand Pivot Transfer Details: Verbal cues for technique;Verbal cues for safe use of DME/AE;Verbal cues for precautions/safety;Verbal cues for sequencing Transfer (Assistive device): Rolling walker Locomotion  Gait Ambulation: Yes Gait Assistance: Supervision/Verbal  cueing Gait Distance (Feet): 35 Feet Assistive device: Rolling walker Gait Assistance Details: Verbal cues for sequencing;Verbal cues for precautions/safety;Verbal cues for safe use of DME/AE;Verbal cues for technique;Verbal cues for gait pattern Gait Gait: Yes Gait Pattern: Impaired Gait Pattern: Step-to pattern Stairs / Additional Locomotion Stairs: No Ramp: Supervision/Verbal cueing (w/c) Pick up small object from the floor assist level: Independent with assistive device Pick up small object from the floor assistive device: Art gallery manager Mobility: Yes Wheelchair Assistance: Independent with Camera operator: Both upper extremities Wheelchair Parts Management: Supervision/cueing Distance: 300 + ft  Trunk/Postural Assessment  Cervical Assessment Cervical Assessment: Within Functional Limits Thoracic Assessment Thoracic Assessment: Within Functional Limits Lumbar Assessment Lumbar Assessment: Exceptions to [facility] Postural Control Postural Control: Within Functional Limits  Balance Balance Balance Assessed: Yes Static Sitting Balance Static Sitting - Balance Support: No upper extremity supported Static Sitting - Level of Assistance: 6: Modified independent (Device/Increase time) Dynamic Sitting Balance Dynamic Sitting - Balance Support: During functional activity;No upper extremity supported Dynamic Sitting - Level of Assistance: 6: Modified independent (Device/Increase time) Dynamic Sitting - Balance Activities: Lateral lean/weight shifting;Forward lean/weight shifting;Reaching for Consulting civil engineer Standing - Balance Support: Bilateral upper extremity supported Static Standing - Level of Assistance: 6: Modified independent (Device/Increase time) Dynamic Standing Balance Dynamic Standing - Balance Support: Bilateral upper extremity supported;During functional activity Dynamic Standing - Level of Assistance: 5:  Stand by  assistance Dynamic Standing - Balance Activities: Lateral lean/weight shifting;Forward lean/weight shifting;Reaching for objects Extremity Assessment  RUE Assessment RUE Assessment: Within Functional Limits LUE Assessment LUE Assessment: Within Functional Limits RLE Assessment RLE Assessment: Within Functional Limits General Strength Comments: grossly 5/5 LLE Assessment Passive Range of Motion (PROM) Comments: full knee extension. mild decrease in hip extension to 5 deg beyon neurtal in standing General Strength Comments: 4+/5 hip abduction/flexion/adduction. at least 3/5 knee flexion/extension    Mickel Fuchs 03/22/2021, 4:41 PM

## 2021-03-22 NOTE — Progress Notes (Signed)
Patient ID: Lori Schmidt, female   DOB: 06/23/1975, 45 y.o.   MRN: [id number] [facility] to do home infusion and [facility] will be educating pt and Mom today prior to discharge home tomorrow. Pt will need to go to OP for PT and is aware of this. Work twaord Arts development officer

## 2021-03-22 NOTE — Progress Notes (Addendum)
Inpatient Rehabilitation Care Coordinator Discharge Note   Patient Details  Name: Lori Schmidt MRN: [id number] Date of Birth: [birth year]   Discharge location: HOME TO PARENTS HOME WHERE MOM CAN PROVIDE ASSIST  Length of Stay:  18 days  Discharge activity level: SUPERVISION LEVEL  Home/community participation: ACTIVE  Patient response KC:LEXNTZ Literacy - How often do you need to have someone help you when you read instructions, pamphlets, or other written material from your doctor or pharmacy?: Never  Patient response GY:FVCBSW Isolation - How often do you feel lonely or isolated from those around you?: Sometimes  Services provided included: MD, RD, PT, OT, RN, CM, Pharmacy, Neuropsych, SW  Financial Services:  Field seismologist Utilized: Engineer, agricultural offered to/list presented to: PT AND MOM  Follow-up services arranged:  Home Health, Outpatient, DME, Patient/Family has no preference for HH/DME agencies Home Health Agency: [facility]-RN FOR IV ANTIBIOTICS  Outpatient Servicies: [facility] WILL CALL PT OR MOM TO SET UP APPOINTMENTS DME : [facility] BEDSIDE [facility]MODE GAVE INFORMATION ON FINANCIAL ASSISTANCE THROUGH HEALTH SYSTEM. PT WILL FOLLOW UP WITH CONCERNED ABOUT HOSPITAL BILL    Patient response to transportation need: Is the patient able to respond to transportation needs?: Yes In the past 12 months, has lack of transportation kept you from medical appointments or from getting medications?: No In the past 12 months, has lack of transportation kept you from meetings, work, or from getting things needed for daily living?: No    Comments (or additional information):MOM WAS HERE DAILY AND OBSERVED PT IN THERAPIES, FEELS COMFORTABLE WITH CARE NEEDS AND WAS EDUCATED ON IV ANTIBIOTICS  Patient/Family verbalized understanding of follow-up arrangements:  Yes  Individual responsible for  coordination of the follow-up plan: Lori Schmidt [phone] OR PATIENT [phone]  Confirmed correct DME delivered: Lori Schmidt 03/22/2021    Lori Schmidt

## 2021-03-22 NOTE — Progress Notes (Signed)
Occupational Therapy Discharge Summary  Patient Details  Name: Lori Schmidt MRN: [id number] Date of Birth: [birth year]     Patient has met 6 of 10 long term goals due to improved activity tolerance, improved balance, postural control, ability to compensate for deficits, improved awareness, and improved coordination.  Patient to discharge at overall Supervision level.  Patient's care partner is independent to provide the necessary physical assistance at discharge.  The pt is Supervision with ADL transfers to/from bed, wheelchair, commode/BSC, and shower bench with use of RW and hopping/turning on RLE. Pt is Set up/supervision with bathing at shower level and is able to wrap her residual limb, needs assist to cover RUE for 2 handed task. Pt is Supervision with LB dressing and toileting tasks as well. Mother has completed several family training sessions and pt is able to direct her care well. Pt is going home with mother who can provide Supervision.   Reasons goals not met: Pt requires Supervision for sit <> stands, standing balance during ADL, toilet transfers, and toileting tasks for safety.   Recommendation:  Patient will benefit from ongoing skilled OT services in home health setting to continue to advance functional skills in the area of BADL and Reduce care partner burden.  Equipment: Drop arm BSC, pt to purchase TTB independently  Reasons for discharge: treatment goals met and discharge from hospital  Patient/family agrees with progress made and goals achieved: Yes  OT Discharge Precautions/Restrictions  Precautions Precautions: Fall Precaution Comments: [name], anxiety Required Braces or Orthoses: Other Brace Other Brace: limb protector Restrictions Weight Bearing Restrictions: Yes LLE Weight Bearing: Non weight bearing Pain Pain Assessment Pain Scale: 0-10 Pain Score: 0-No pain ADL ADL Eating: Independent Grooming: Modified independent Upper Body Bathing:  Setup Lower Body Bathing: Supervision/safety Upper Body Dressing: Modified independent (Device) Lower Body Dressing: Supervision/safety Toileting: Supervision/safety Toilet Transfer: Close supervision Toilet Transfer Method: Stand pivot Vision Baseline Vision/History: 1 Wears glasses Patient Visual Report: Blurring of vision Vision Assessment?: Vision impaired- to be further tested in functional context Perception  Perception: Within Functional Limits Praxis Praxis: Intact Cognition Overall Cognitive Status: Within Functional Limits for tasks assessed Arousal/Alertness: Awake/alert Orientation Level: Oriented X4 Year: 2022 Month: December Day of Week: Correct Memory: Appears intact Awareness: Appears intact Problem Solving: Appears intact Safety/Judgment: Appears intact Sensation Sensation Light Touch: Impaired Detail Peripheral sensation comments: peripheral neuropathy on the RLE. Light Touch Impaired Details: Impaired RLE;Impaired LLE Proprioception: Appears Intact Coordination Gross Motor Movements are Fluid and Coordinated: No Fine Motor Movements are Fluid and Coordinated: Yes Coordination and Movement Description: grossly impaired 2/2 BKA Motor  Motor Motor: Within Functional Limits Motor - Skilled Clinical Observations: generalized weakness Mobility  Transfers Sit to Stand: Supervision/Verbal cueing Stand to Sit: Supervision/Verbal cueing  Trunk/Postural Assessment  Cervical Assessment Cervical Assessment: Within Functional Limits Thoracic Assessment Thoracic Assessment: Within Functional Limits Lumbar Assessment Lumbar Assessment: Exceptions to [facility] Postural Control Postural Control: Within Functional Limits  Balance Balance Balance Assessed: Yes Static Sitting Balance Static Sitting - Balance Support: No upper extremity supported Static Sitting - Level of Assistance: 6: Modified independent (Device/Increase time) Dynamic Sitting Balance Dynamic  Sitting - Balance Support: During functional activity;No upper extremity supported Dynamic Sitting - Level of Assistance: 6: Modified independent (Device/Increase time) Dynamic Sitting - Balance Activities: Lateral lean/weight shifting;Forward lean/weight shifting;Reaching for objects Static Standing Balance Static Standing - Balance Support: Bilateral upper extremity supported Static Standing - Level of Assistance: 6: Modified independent (Device/Increase time) Dynamic Standing Balance Dynamic Standing - Balance  Support: Bilateral upper extremity supported;During functional activity Dynamic Standing - Level of Assistance: 5: Stand by assistance Dynamic Standing - Balance Activities: Lateral lean/weight shifting;Forward lean/weight shifting;Reaching for objects Extremity/Trunk Assessment RUE Assessment RUE Assessment: Within Functional Limits LUE Assessment LUE Assessment: Within Functional Limits   Hannah C Spach 03/22/2021, 12:54 PM 

## 2021-03-23 LAB — GLUCOSE, CAPILLARY: Glucose-Capillary: 86 mg/dL (ref 70–99)

## 2021-03-23 NOTE — Progress Notes (Signed)
PROGRESS NOTE   Subjective/Complaints:  Pt reports in great mood this  AM- ready for d/c- wants to leave around 9am- as long as equipment here, no issues.   Went over d/c meds and getting 7 days of pain meds- will need to call to get refills.   ROS:   Pt denies SOB, abd pain, CP, N/V/C/D, and vision changes    Objective:   No results found. Recent Labs    03/22/21 0309  WBC 5.9  HGB 8.9*  HCT 27.9*  PLT 231    Recent Labs    03/22/21 0309  NA 139  K 3.6  CL 105  CO2 26  GLUCOSE 99  BUN 18  CREATININE 0.43*  CALCIUM 8.3*     Intake/Output Summary (Last 24 hours) at 03/23/2021 0905 Last data filed at 03/22/2021 2251 Gross per 24 hour  Intake 1376 ml  Output --  Net 1376 ml        Physical Exam: Vital Signs Blood pressure 114/70, pulse 92, temperature 98.4 F (36.9 C), resp. rate 17, height _0  (1.702 m), weight 117.2 kg, SpO2 97 %.         General: awake, alert, appropriate, sitting up in w/c working on cleaning her room; NAD HENT: conjugate gaze; oropharynx moist CV: regular rate; no JVD Pulmonary: CTA B/L; no W/R/R- good air movement GI: soft, NT, ND, (+)BS Psychiatric: appropriate- bright Neurological: Ox3   Musc: [name] looks great- some dried scabs/blood seen- a little peeling of skin on Medial aspect; but otherwise, more shaping, less edema; staples intact Skin- some irritation and mild rawness on back of L middle thigh, however not open- no redness anymore- just line from shrinker and a little peeling skin- covered with foam Neuro: Alert Motor: Right lower extremity: 4+/5 proximal distal, stable Left lower extremity: Hip flexion with some limitations due to pain   Assessment/Plan: 1. Functional deficits which require 3+ hours per day of interdisciplinary therapy in a comprehensive inpatient rehab setting. Physiatrist is providing close team supervision and 24 hour management  of active medical problems listed below. Physiatrist and rehab team continue to assess barriers to discharge/monitor patient progress toward functional and medical goals  Care Tool:  Bathing    Body parts bathed by patient: Right arm, Left arm, Chest, Abdomen, Front perineal area, Right upper leg, Left upper leg, Face, Buttocks, Right lower leg   Body parts bathed by helper: Right lower leg Body parts n/a: Left lower leg   Bathing assist Assist Level: Supervision/Verbal cueing     Upper Body Dressing/Undressing Upper body dressing   What is the patient wearing?: Pull over shirt    Upper body assist Assist Level: Set up assist    Lower Body Dressing/Undressing Lower body dressing      What is the patient wearing?: Pants, Underwear/pull up     Lower body assist Assist for lower body dressing: Supervision/Verbal cueing     Toileting Toileting Toileting Activity did not occur (Clothing management and hygiene only):  (on BSC)  Toileting assist Assist for toileting: Supervision/Verbal cueing     Transfers Chair/bed transfer  Transfers assist     Chair/bed transfer assist  level: Supervision/Verbal cueing     Locomotion Ambulation   Ambulation assist   Ambulation activity did not occur: Safety/medical concerns  Assist level: Supervision/Verbal cueing Assistive device: Walker-rolling Max distance: 35 ft   Walk 10 feet activity   Assist  Walk 10 feet activity did not occur: Safety/medical concerns  Assist level: Supervision/Verbal cueing Assistive device: Walker-rolling   Walk 50 feet activity   Assist Walk 50 feet with 2 turns activity did not occur: Safety/medical concerns         Walk 150 feet activity   Assist Walk 150 feet activity did not occur: Safety/medical concerns         Walk 10 feet on uneven surface  activity   Assist Walk 10 feet on uneven surfaces activity did not occur: Safety/medical concerns          Wheelchair     Assist Is the patient using a wheelchair?: Yes Type of Wheelchair: Manual    Wheelchair assist level: Independent Max wheelchair distance: >82'    Wheelchair 50 feet with 2 turns activity    Assist        Assist Level: Independent   Wheelchair 150 feet activity     Assist      Assist Level: Independent   Blood pressure 114/70, pulse 92, temperature 98.4 F (36.9 C), resp. rate 17, height $RemoveBe'5\' 7"'YdyxUGyIK$  (1.702 m), weight 117.2 kg, SpO2 97 %.    Medical Problem List and Plan: 1. 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  Impaired mobility: Continue Lovenox.  Venous Doppler is negative             -antiplatelet therapy: N/A 3. 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   12/12- made Lyrica changes as above- send home on MS contin 15 mg BID not oxycontin.   12/13- explained will get 7 days of pain meds- then call for 30 days supply.  4. Anxiety: Continue Celexa 40 mg daily, Ambien 5 mg nightly.  Provide emotional support             -antipsychotic agents: N/A 5. Neuropsych: This patient is capable of making decisions on her own behalf. 6. Skin/Wound Care: Routine skin checks 7. Fluids/Electrolytes/Nutrition: Routine in and outs 8.  ID/MSSA bacteremia.  Continue Ancef 2 g every 8 hours through 04/14/2021 and stop- per ID.   Normal  ESR/CRP, WBCs and diff Monday  12/12- CRP 0.7 and ESR 15- doing great- 9.  Acute blood loss anemia.    Hemoglobin 9.5 on 11/28, labs ordered for tomorrow  12/6- Hb 9.1- stable- con't regimen 10.  Diabetes  mellitus with peripheral neuropathy. A1c 13- had for 14 years;  NovoLog 8 units 3 times daily with meals, Levemir 55 units nightly, Tradjenta 5 mg daily, Glucophage 500 mg daily.  Check blood sugars before meals and at bedtime.  Diabetic teaching  CBG (last 3)  Recent Labs    03/22/21 1627 03/22/21 2127 03/23/21 0632  GLUCAP 82 131* 86   12/10- CBGs doing great- con't regimen  12/12- going home on current regimen- knows how  to use insulin and check CBGs  12/13- will need to go home on Novolog with meals and long acting insulin- pt feels there's no problem doing that.  11.  Hyperlipidemia.  Pravachol 12.  Obesity.  Dietary follow-up 13. L shoulder bicipital tendinitis- suggest starting Voltaren gel 2 G QID  11/29- cannot do steroid injection due to lack of overall control of CBGs/A1c 13.   12/1- is tolerable- con't voltaren and current pain regimen 14. [name] Educated on avoiding contractures  12/7- will try and get a smaller limb guard- per PT, sometimes unsafe with transfers, so don't want to transfer without it.   12/8- got new limb guard  12/9- skin itching- encouraged her to put cloth between limb guard and L thigh where rubbed/irritated skin  12/10- skin looks better- likely having discomfort from  shrinker rubbing on back of L posterior thigh-  18.  Hypoalbuminemia  Supplement initiated  12/9- f/u labs Monday  12/12- stable- con't supplements.  LOS: 18 days A FACE TO FACE EVALUATION WAS PERFORMED  Dev Dhondt 03/23/2021, 9:05 AM

## 2021-03-23 NOTE — Progress Notes (Signed)
PA Dan in with pt to discuss discharge instructions. Pt in agreement. Belongings gathered. Meds returned to pt. Pt left per wheelchair to private vehicle. Mylo Red, LPN

## 2021-03-30 ENCOUNTER — Other Ambulatory Visit: Payer: Self-pay

## 2021-03-30 ENCOUNTER — Ambulatory Visit (INDEPENDENT_AMBULATORY_CARE_PROVIDER_SITE_OTHER): Payer: Managed Care, Other (non HMO) | Admitting: Family

## 2021-03-30 ENCOUNTER — Encounter: Payer: Self-pay | Admitting: Family

## 2021-03-30 DIAGNOSIS — Z89512 Acquired absence of left leg below knee: Secondary | ICD-10-CM

## 2021-03-30 NOTE — Progress Notes (Signed)
° °  Post-Op Visit Note   Patient: Lori Schmidt           Date of Birth: Sep 09, 1975           MRN: [id number] Visit Date: 03/30/2021 PCP: Primus Bravo, NP  Chief Complaint:  Chief Complaint  Patient presents with   Left Leg - Routine Post Op    Left BKA 02/19/21    HPI:  HPI The patient is a 45 year old woman seen status post left below-knee amputation.  No concerns. Ortho Exam On examination of the left residual limb this is well consolidated is healing well her staples remain in place there are 2 islands of eschar there is no surrounding erythema no drainage  Visit Diagnoses:  1. History of left below knee amputation (HCC)     Plan: Continue daily Dial soap cleansing.  Shrinker with direct skin contact.  Given an order for her initial prosthesis set up.  She will follow-up in the office in 4 weeks.  Staples harvested today.  Follow-Up Instructions: Return in about 4 weeks (around 04/27/2021).   Imaging: No results found.  Orders:  No orders of the defined types were placed in this encounter.  No orders of the defined types were placed in this encounter.    PMFS History: Patient Active Problem List   Diagnosis Date Noted   Dyslipidemia    Hypoalbuminemia due to protein-calorie malnutrition (HCC)    Diabetic peripheral neuropathy (HCC)    Acute blood loss anemia    S/P BKA (below knee amputation) unilateral, left (HCC) 03/05/2021   Left below-knee amputee (HCC) 03/05/2021   Adjustment disorder with mixed anxiety and depressed mood    Pressure injury of skin 02/28/2021   Discitis of lumbar region    Subacute osteomyelitis, left ankle and foot (HCC)    Diabetes mellitus (HCC)    Left leg cellulitis    Bacteremia due to Staphylococcus aureus 02/17/2021   Morbid obesity with BMI of 45.0-49.9, adult (HCC) 01/05/2012   Past Medical History:  Diagnosis Date   Hyperlipidemia    Hypertension    Obesity    Type 2 diabetes mellitus (HCC)     Family History   Problem Relation Age of Onset   Hypothyroidism Mother    Diabetes Mother    Diabetes Maternal Grandmother    Breast cancer Paternal Grandmother     Past Surgical History:  Procedure Laterality Date   AMPUTATION Left 02/19/2021   Procedure: AMPUTATION BELOW KNEE;  Surgeon: Nadara Mustard, MD;  Location: [facility] OR;  Service: Orthopedics;  Laterality: Left;   BREAST CYST EXCISION Right 05/2018   four cysts removed on right breast/axilla area   TEE WITHOUT CARDIOVERSION N/A 02/23/2021   Procedure: TRANSESOPHAGEAL ECHOCARDIOGRAM (TEE);  Surgeon: Chrystie Nose, MD;  Location: [facility] ENDOSCOPY;  Service: Cardiovascular;  Laterality: N/A;   Social History   Occupational History   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

## 2021-03-31 ENCOUNTER — Inpatient Hospital Stay: Payer: Managed Care, Other (non HMO) | Admitting: Internal Medicine

## 2021-04-06 ENCOUNTER — Encounter: Payer: Managed Care, Other (non HMO) | Admitting: Registered Nurse

## 2021-04-07 ENCOUNTER — Other Ambulatory Visit: Payer: Self-pay

## 2021-04-07 ENCOUNTER — Ambulatory Visit (INDEPENDENT_AMBULATORY_CARE_PROVIDER_SITE_OTHER): Payer: Managed Care, Other (non HMO) | Admitting: Internal Medicine

## 2021-04-07 ENCOUNTER — Encounter: Payer: Self-pay | Admitting: Internal Medicine

## 2021-04-07 VITALS — BP 137/94 | HR 110 | Temp 98.1°F

## 2021-04-07 DIAGNOSIS — M8619 Other acute osteomyelitis, multiple sites: Secondary | ICD-10-CM

## 2021-04-07 NOTE — Progress Notes (Signed)
Patient Active Problem List   Diagnosis Date Noted   Dyslipidemia    Hypoalbuminemia due to protein-calorie malnutrition [facility])    Diabetic peripheral neuropathy ([place])    Acute blood loss anemia    S/P BKA (below knee amputation) unilateral, left ([place]) 03/05/2021   Left below-knee amputee ([place]) 03/05/2021   Adjustment disorder with mixed anxiety and depressed mood    Pressure injury of skin 02/28/2021   Discitis of lumbar region    Subacute osteomyelitis, left ankle and foot ([place])    Diabetes mellitus ([place])    Left leg cellulitis    Bacteremia due to Staphylococcus aureus 02/17/2021   Morbid obesity with BMI of 45.0-49.9, adult ([place]) 01/05/2012    Patient's Medications  New Prescriptions   No medications on file  Previous Medications   ACETAMINOPHEN (TYLENOL) 325 MG TABLET    Take 1-2 tablets (325-650 mg total) by mouth every 6 (six) hours as needed for mild pain (pain score 1-3 or temp > 100.5).   ASCORBIC ACID (VITAMIN C) 1000 MG TABLET    Take 1 tablet (1,000 mg total) by mouth daily.   CEFAZOLIN (ANCEF) IVPB    Inject 2 g into the vein every 8 (eight) hours. Indication:  Diskitis/bacteremia First Dose: No Last Day of Therapy:  04/14/21 Labs - Once weekly:  CBC/D and BMP, Labs - Every other week:  ESR and CRP Method of administration: IV Push Method of administration may be changed at the discretion of home infusion pharmacist based upon assessment of the patient and/or caregiver's ability to self-administer the medication ordered.   CITALOPRAM (CELEXA) 40 MG TABLET    Take 1 tablet (40 mg total) by mouth at bedtime.   CYANOCOBALAMIN 1000 MCG TABLET    Take 1/2 tablet (500 mcg total) by mouth daily.   DAPAGLIFLOZIN PROPANEDIOL (FARXIGA) 5 MG TABS TABLET    Take 1 tablet (5 mg total) by mouth daily before breakfast.   DICLOFENAC SODIUM (VOLTAREN) 1 % GEL    Apply 2 g topically 4 (four) times daily.   DOCUSATE SODIUM (COLACE) 100 MG CAPSULE    Take 1 capsule (100 mg  total) by mouth daily.   INSULIN DETEMIR (LEVEMIR) 100 UNIT/ML FLEXPEN    Inject 55 Units into the skin at bedtime.   INSULIN LISPRO (HUMALOG) 100 UNIT/ML KWIKPEN    Inject 8 Units into the skin 3 (three) times daily with meals.   LOSARTAN (COZAAR) 25 MG TABLET    Take 1 tablet (25 mg total) by mouth at bedtime.   MAGNESIUM OXIDE (MAG-OX) 400 MG TABLET    Take 1 tablet (400 mg total) by mouth daily.   MEDROXYPROGESTERONE ACETATE (DEPO-PROVERA IM)    Inject into the muscle every 3 (three) months.   METFORMIN (GLUCOPHAGE) 1000 MG TABLET    Take 1 tablet (1,000 mg total) by mouth 2 (two) times daily with a meal.   METHOCARBAMOL (ROBAXIN) 750 MG TABLET    Take 1 tablet (750 mg total) by mouth every 6 (six) hours.   ONDANSETRON (ZOFRAN-ODT) 8 MG DISINTEGRATING TABLET    ondansetron 8 mg disintegrating tablet   OXYCODONE (OXY IR/ROXICODONE) 5 MG IMMEDIATE RELEASE TABLET    Take 1 tablet (5 mg total) by mouth every 8 (eight) hours as needed for severe pain or breakthrough pain.   OXYCODONE (OXYCONTIN) 10 MG 12 HR TABLET    Take 1 tablet (10 mg total) by mouth every 12 (twelve) hours.  PANTOPRAZOLE (PROTONIX) 40 MG TABLET    Take 1 tablet (40 mg total) by mouth daily.   POLYETHYLENE GLYCOL (MIRALAX / GLYCOLAX) 17 G PACKET    Take 17 g by mouth daily.   PRAVASTATIN (PRAVACHOL) 40 MG TABLET    Take 1 tablet (40 mg total) by mouth daily.   PREGABALIN (LYRICA) 100 MG CAPSULE    Take 1 capsule (100 mg) by mouth twice daily and 2 capsules (200 mg total) nightly  Modified Medications   No medications on file  Discontinued Medications   No medications on file    Subjective: 45 year old female with history of diabetes(A1c 12.9 on 03/15/2018), hypertension, obesity presents for hospital follow-up.  Patient was admitted to [facility] 11/25-12/13 for left lower extremity wound.  She was found to have blood cultures positive +MSSA.  MRI of left foot showed extensive acute osteomyelitis throughout left hindfoot and  forefoot.  Underwent left BKA on 02/19/21.  During hospitalization she complained of back pain and found to have findings concerning for L4-L5 osteomyelitis on MRI on 02/28/2021.  TEE did not show vegetations.  ID was consulted and plan for 8 weeks of antibiotic treatment from negative blood cultures with cefazolin for vertebral osteomyelitis/MSSA bacteremia Today, patient reports doing well.  She does report her back pain/spasm has continued although improved.  She denies fevers, chills, nausea, vomiting, diarrhea.  She is currently living with her mother but plans to move back to her home after completing IV antibiotics.  Review of Systems: Review of Systems  All other systems reviewed and are negative.  Past Medical History:  Diagnosis Date   Hyperlipidemia    Hypertension    Obesity    Type 2 diabetes mellitus ([place])     Social History   Tobacco Use   Smoking status: Former    Types: Cigarettes    Quit date: 12/11/2010    Years since quitting: 10.3   Smokeless tobacco: Never  Substance Use Topics   Alcohol use: No   Drug use: No    Family History  Problem Relation Age of Onset   Hypothyroidism Mother    Diabetes Mother    Diabetes Maternal Grandmother    Breast cancer Paternal Grandmother     Allergies  Allergen Reactions   Sulfa Antibiotics Other (See Comments)    Makes sick   Lisinopril Cough    Health Maintenance  Topic Date Due   HEMOGLOBIN A1C  Never done   Pneumococcal Vaccine 65-47 Years old (1 - PCV) Never done   FOOT EXAM  Never done   OPHTHALMOLOGY EXAM  Never done   Hepatitis C Screening  Never done   PAP SMEAR-Modifier  Never done   COLONOSCOPY (Pts 45-58yr Insurance coverage will need to be confirmed)  Never done   COVID-19 Vaccine (3 - Booster for Janssen series) 06/05/2020   INFLUENZA VACCINE  11/09/2020   TETANUS/TDAP  12/04/2029   HIV Screening  Completed   HPV VACCINES  Aged Out    Objective:  There were no vitals filed for this  visit. There is no height or weight on file to calculate BMI.  Physical Exam Constitutional:      Appearance: Normal appearance.  HENT:     Head: Normocephalic and atraumatic.     Right Ear: Tympanic membrane normal.     Left Ear: Tympanic membrane normal.     Nose: Nose normal.     Mouth/Throat:     Mouth: Mucous membranes are moist.  Eyes:  Extraocular Movements: Extraocular movements intact.     Conjunctiva/sclera: Conjunctivae normal.     Pupils: Pupils are equal, round, and reactive to light.  Cardiovascular:     Rate and Rhythm: Normal rate and regular rhythm.     Heart sounds: No murmur heard.   No friction rub. No gallop.  Pulmonary:     Effort: Pulmonary effort is normal.     Breath sounds: Normal breath sounds.  Abdominal:     General: Abdomen is flat.     Palpations: Abdomen is soft.  Musculoskeletal:        General: Normal range of motion.     Comments: Left BKA with scabbing  Skin:    General: Skin is warm and dry.  Neurological:     General: No focal deficit present.     Mental Status: She is alert and oriented to person, place, and time.  Psychiatric:        Mood and Affect: Mood normal.    Lab Results Lab Results  Component Value Date   WBC 5.9 03/22/2021   HGB 8.9 (L) 03/22/2021   HCT 27.9 (L) 03/22/2021   MCV 88.0 03/22/2021   PLT 231 03/22/2021    Lab Results  Component Value Date   CREATININE 0.43 (L) 03/22/2021   BUN 18 03/22/2021   NA 139 03/22/2021   K 3.6 03/22/2021   CL 105 03/22/2021   CO2 26 03/22/2021    Lab Results  Component Value Date   ALT 7 03/22/2021   AST 14 (L) 03/22/2021   ALKPHOS 75 03/22/2021   BILITOT 0.4 03/22/2021    No results found for: CHOL, HDL, LDLCALC, LDLDIRECT, TRIG, CHOLHDL Lab Results  Component Value Date   LABRPR NON REACTIVE 02/22/2007   No results found for: HIV1RNAQUANT, HIV1RNAVL, CD4TABS #MSSA bacteremia with vertebral osteomyelitis 2/2 left lower extremity osteomyelitis SP BKA  #On  cefazolin x 8 weeks -Blood Cx cleared on 02/19/21, BKA on 02/19/21 -Pt reports persistent back pain, as such plan for MRI in 1 week Plan: MRI lumbar spine w/ and w/o con in 1 week Complete 8 weeks of antibiotics from clearance of blood Cx(EOT 04/16/21) for vertebral osteomyelitis Follow-up in 2 weeks  Laurice Record, [place] [facility] [place] 04/07/2021, 10:54 AM

## 2021-04-09 ENCOUNTER — Encounter: Payer: Self-pay | Admitting: Registered Nurse

## 2021-04-09 ENCOUNTER — Other Ambulatory Visit: Payer: Self-pay

## 2021-04-09 ENCOUNTER — Encounter: Payer: Managed Care, Other (non HMO) | Attending: Registered Nurse | Admitting: Registered Nurse

## 2021-04-09 VITALS — BP 119/80 | HR 98 | Temp 98.1°F | Ht 67.0 in

## 2021-04-09 DIAGNOSIS — M4646 Discitis, unspecified, lumbar region: Secondary | ICD-10-CM | POA: Diagnosis present

## 2021-04-09 DIAGNOSIS — E1142 Type 2 diabetes mellitus with diabetic polyneuropathy: Secondary | ICD-10-CM | POA: Diagnosis present

## 2021-04-09 DIAGNOSIS — Z89512 Acquired absence of left leg below knee: Secondary | ICD-10-CM

## 2021-04-09 DIAGNOSIS — R7881 Bacteremia: Secondary | ICD-10-CM

## 2021-04-09 DIAGNOSIS — L03116 Cellulitis of left lower limb: Secondary | ICD-10-CM

## 2021-04-09 DIAGNOSIS — B9561 Methicillin susceptible Staphylococcus aureus infection as the cause of diseases classified elsewhere: Secondary | ICD-10-CM | POA: Diagnosis present

## 2021-04-09 MED ORDER — PREGABALIN 100 MG PO CAPS: ORAL_CAPSULE | ORAL | 0 refills | Status: DC

## 2021-04-09 MED ORDER — CITALOPRAM HYDROBROMIDE 40 MG PO TABS: 40.0000 mg | ORAL_TABLET | Freq: Every day | ORAL | 0 refills | Status: DC

## 2021-04-09 MED ORDER — METHOCARBAMOL 750 MG PO TABS: 750.0000 mg | ORAL_TABLET | Freq: Four times a day (QID) | ORAL | 0 refills | Status: DC

## 2021-04-09 NOTE — Progress Notes (Signed)
Subjective:    Patient ID: Lori Schmidt, female    DOB: 1975/12/01, 45 y.o.   MRN: [id number]  HPI: Lori Schmidt is a 45 y.o. female who is here for HFU appointment for follow up of her S/P Left BKA, Discitis of Lumbar Region, Diabetic Peripheral Neuropathy and Bacteremia due to Staphylococcus Aureus. She presented to [facility] on 02/17/2021' for left lower extremity swelling H&P: Dr. Sharene Butters on 02/17/2021 Chief Complaint: Left lower extremity swelling   History of Present Illness:  Lori Schmidt is a 45 year old Caucasian female with past medical history of obesity, type 2 diabetes, depression, hyperlipidemia, who presents for lower extremity swelling.   Patient reports 1 month history of gradually worsening lower extremity swelling with erythema.  Denies injury to the leg.  Patient was managing the symptoms at home with ibuprofen, soaking the foot, however her foot continued to worsen.  Patient endorsed fever and malaise. Several days ago 11/03 patient was seen at [facility] for her lower extremity swelling.  [facility] PA had concern for DVT, despite patient walking daily at work.  Patient left appointment AMA without lower extremity ultrasound completed.  The following day patient notes that the swelling had progressed to the point where she could no longer ambulate, with intermittent pain in her foot.  She also reports 1 week history of nausea and vomiting.  Last bout of emesis several days ago.  11/8 patient was seen at urgent care, was given shot of ceftriaxone and started on Keflex and Zofran. 11/9 patient presented to [facility] ED for same complaint.  Lower extremity ultrasound negative for DVT.  Blood cultures collected.  Patient was discharged home and encouraged to take her antibiotics.  The following day patient was contacted for positive blood cultures growing MSSA and was told to return to the ED.   Currently patient endorsing  left lower extremity pain and is requesting ibuprofen.  She denies chest pain, shortness of breath, cough, abdominal pain, dysuria, nausea and vomiting.  She reports history of diabetes, endorses adherence to diabetes regimen.   In the ED, patient is afebrile, tachycardic to 118, normotensive, satting well on room air.  Labs notable for low potassium 2.7, glucose 335 with anion gap of 8 and bicarb 17, sodium low at 128.  CBC without leukocytosis with normocytic anemia with hemoglobin 10.8.  Lactic acid normal at 1.2.  X-ray of left foot without acute fracture dislocation, with diffuse soft tissue swelling and subcutaneous edema of the dorsum of the foot.  Patient received dose of Ancef and 1 L fluid bolus in the ED.   DG: Left Foot:  IMPRESSION: 1. No acute fracture or dislocation. 2. Diffuse soft tissue swelling and subcutaneous edema of the dorsum of the foot. Findings may represent cellulitis. Clinical correlation is recommended. 3. Irregularity of the dorsal cortex of a tarsal bone, likely medial cuneiform.  MR Left Foot:  IMPRESSION: 1. Extensive acute osteomyelitis throughout the left hindfoot and forefoot with multifocal bony involvement, as detailed above. 2. Numerous peripherally enhancing joint effusions throughout the left midfoot, many of which are contiguous with rim enhancing fluid collections in the adjacent soft tissues. Findings are compatible with multifocal septic arthritis and adjacent abscesses. 3. Extensive tenosynovial fluid collections throughout the flexor and extensor tendons of the left foot, compatible with infectious tenosynovitis. 4. Small tibiotalar and subtalar joint effusions, may be reactive or reflect septic arthritis. 5. Marked diffuse cellulitis of the left foot.   MR  Tibia/Fibula:  IMPRESSION: 1. Findings of diffuse cellulitis and myofasciitis throughout the left lower leg. No organized or rim-enhancing fluid collections. 2. Small volume  tenosynovial fluid collections associated with the peroneal tendons, tibialis posterior tendon, as well as the flexor hallucis longus and flexor digitorum longus tendons at the posterior aspect of the ankle suggesting mild tenosynovitis, which could be reactive or infectious. 3. No evidence of osteomyelitis of the left tibia or fibula.    MR: Lumbar:    IMPRESSION: 1. Edema in the L4-L5 disc with adjacent inferior L4 and superior L5 endplate edema and enhancement. Findings are concerning for discitis/osteomyelitis given the reported clinical suspicion for infection. The degree of endplate enhancement is greater than expected for degenerative/discogenic endplate signal changes. No definite epidural involvement. 2. At L4-L5, inferiorly dissecting right subarticular disc protrusion with mild right subarticular recess and foraminal stenosis.  Orthopedic and ID Consulted  She underwent : on 02/19/2021 by Dr Lajoyce Corners AMPUTATION BELOW KNEE Left  Lori Schmidt was admitted to inpatient Rehabilitation on 03/05/2021 and was discharged home on 03/23/2021. She is receiving home health therapy with  [facility]. She reports she has joint pain. She rates her pain 9. Also reports she has a good appetite.   Mother in room.   Lori Schmidt spoke with Dr Candise Bowens regarding returning to her own home, Dr Berline Chough gave Lori Schmidt to return to her own home, she verbalizes understanding.    Pain Inventory Average Pain 7 Pain Right Now 9 My pain is intermittent, tingling, and aching  In the last 24 hours, has pain interfered with the following? General activity 6 Relation with others 2 Enjoyment of life 4 What TIME of day is your pain at its worst? morning  and night Sleep (in general) Fair  Pain is worse with: sitting and sleeping Pain improves with: therapy/exercise and medication Relief from Meds:  Good  walk without assistance use a walker ability to climb steps?  no do you drive?   no use a wheelchair transfers alone Do you have any goals in this area?  yes  employed # of hrs/week on medical leave from [employer] I need assistance with the following:  bathing and shopping Do you have any goals in this area?  yes  weakness numbness tingling dizziness anxiety  Any changes since last visit?  no  Any changes since last visit?  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 Mother is Tally Due   Social Determinants of Health  Financial Resource Strain: Not on file  Food Insecurity: Not on file  Transportation Needs: Not on file  Physical Activity: Not on file  Stress: Not on file  Social Connections: Not on file   Past Surgical History:  Procedure Laterality Date   AMPUTATION Left 02/19/2021   Procedure: AMPUTATION BELOW KNEE;  Surgeon: Nadara Mustard, MD;  Location: [facility] OR;  Service: Orthopedics;  Laterality: Left;   BREAST CYST EXCISION Right 05/2018   four cysts removed on right breast/axilla area   TEE WITHOUT CARDIOVERSION N/A 02/23/2021   Procedure: TRANSESOPHAGEAL ECHOCARDIOGRAM (TEE);  Surgeon: Chrystie Nose, MD;  Location: [facility] ENDOSCOPY;   Service: Cardiovascular;  Laterality: N/A;   Past Medical History:  Diagnosis Date   Hyperlipidemia    Hypertension    Obesity    Type 2 diabetes mellitus (HCC)    BP 119/80    Pulse 98    Temp 98.1 F (36.7 C)    Ht 5\' 7"  (1.702 m)    SpO2 98%    BMI 40.47 kg/m   Opioid Risk Score:   Fall Risk Score:  `1  Depression screen PHQ 2/9  No flowsheet data found.  Review of Systems  Musculoskeletal:        Left BKA  Skin:  Positive for wound.       Left BKA  Neurological:  Positive for weakness and numbness.       Tingling  Psychiatric/Behavioral:         Anxiety  All other systems reviewed and are negative.     Objective:   Physical Exam Vitals and nursing note reviewed.  Constitutional:      Appearance: Normal appearance.  Cardiovascular:     Rate and Rhythm: Normal rate.     Pulses: Normal pulses.     Heart sounds: Normal heart sounds.  Pulmonary:     Effort: Pulmonary effort is normal.     Breath sounds: Normal breath sounds.  Musculoskeletal:     Cervical back: Normal range of motion and neck supple.     Comments: Normal Muscle Bulk and Muscle Testing Reveals:  Upper Extremities: Full ROM and Muscle Strength  5/5  Lower Extremities : Right: Full ROM and Muscle Strength 5/5 Left Lower Extremity: Left BKA Arrived in wheelchair      Skin:    General: Skin is warm and dry.     Comments: Left BKA: With Scabbed Areas   Neurological:     Mental Status: She is alert and oriented to person, place, and time.  Psychiatric:        Mood and Affect: Mood normal.        Behavior: Behavior normal.         Assessment & Plan:  S/P Left BKA, : Dr 4/9 Following. Continue to Monitor.  Discitis of Lumbar Region: ID Following. Continue current medication regimen. Continue to monitor.   Diabetic Peripheral Neuropathy: Continue current medication regimen. PCP Following. Continue to Monitor.  Bacteremia due to Staphylococcus Aureus.ID Following: Dr Lajoyce Corners. Continue to  Monitor.  F/U in 4- 6 weeks with Dr Earlene Plater

## 2021-04-13 ENCOUNTER — Telehealth: Payer: Self-pay

## 2021-04-13 NOTE — Telephone Encounter (Signed)
Patient is calling regarding IV antibiotics. Patient is scheduled for last dose on 04/14/21 and scheduled to follow up on 04/22/21 with Dr. Thedore Mins. HH does not have pull picc orders for the patient. Can picc be removed after last dose and orders be given to [facility] with [facility] to relay to [facility]? If IV antibiotics are needing to be extended please let patient know as well.  Please advise

## 2021-04-13 NOTE — Telephone Encounter (Signed)
Verbal orders relayed to pull PICC after last dose on 04/16/21 per Dr. Thedore Mins. Thanks!

## 2021-04-15 ENCOUNTER — Encounter: Payer: Self-pay | Admitting: Registered Nurse

## 2021-04-16 ENCOUNTER — Encounter: Payer: Self-pay | Admitting: Family

## 2021-04-16 ENCOUNTER — Other Ambulatory Visit: Payer: Self-pay

## 2021-04-16 ENCOUNTER — Ambulatory Visit (INDEPENDENT_AMBULATORY_CARE_PROVIDER_SITE_OTHER): Payer: Managed Care, Other (non HMO) | Admitting: Family

## 2021-04-16 ENCOUNTER — Ambulatory Visit: Payer: Managed Care, Other (non HMO)

## 2021-04-16 DIAGNOSIS — Z89512 Acquired absence of left leg below knee: Secondary | ICD-10-CM

## 2021-04-16 DIAGNOSIS — R2689 Other abnormalities of gait and mobility: Secondary | ICD-10-CM

## 2021-04-16 NOTE — Therapy (Signed)
[facility] [address] [place], [place], [address] Phone: [phone]   Fax:  [phone]  Patient Details  Name: Lori Schmidt MRN: [id number] Date of Birth: October 13, 1975 Referring Provider:  Charlton Amor, PA-C  Encounter Date: 04/16/2021  Visit was arrived no charge. Pt presented for referral after discharging from inpatient rehab for left BKA. Pt wearing residual limb protector. Reports that she is independently with everything at home. Currently staying with her mom but plans to return home soon. Mother confirms she is not having to help with anything. Pt is doing exercises including quad sets and hip extension on left. Standing at sink doing dishes. Walking with walker to/from bathroom without difficulty. Does not feel she has any needs until gets her leg. Reports her back is doing much better than it was and will be getting her PICC removed soon for the discitis. PT checked residual limb as pt just got staples removed on 12/20. Noted pt has scar tissue present in a few spots and anterior medial incision line has dehisted in 2 places with yellow slough present. Does not appear to be draining. Mild redness around these areas. Pt not scheduled to return to orthopedic doctor until 1/17. PT called Dr. Audrie Lia office to report change in wound with dehiscence. They are going to have her come in today at 10:15 to check on it. Pt appreciative of this. Discussed with pt the process for returning when she received prosthesis. She has appointment with prosthetist 1/12. Will hold on PT at this time until she receives her prosthesis as pt moving well and doing appropriate exercises. Left knee had full extension. Pt in agreement. Ronn Melena, PT, DPT, NCS 04/16/2021, 9:07 AM   [facility] [address] [place], [place], [address] Phone: [phone]   Fax:  [phone]

## 2021-04-17 ENCOUNTER — Ambulatory Visit (HOSPITAL_COMMUNITY)
Admission: RE | Admit: 2021-04-17 | Discharge: 2021-04-17 | Disposition: A | Discharge status: Home / Self Care | Payer: Managed Care, Other (non HMO) | Source: Ambulatory Visit | Attending: Internal Medicine | Admitting: Internal Medicine

## 2021-04-17 DIAGNOSIS — M8619 Other acute osteomyelitis, multiple sites: Secondary | ICD-10-CM | POA: Insufficient documentation

## 2021-04-17 IMAGING — MR MR LUMBAR SPINE WO/W CM
6 of 7 series · 31 of 48 positions shown · IV contrast (10 GADAVIST)
Comparison: Lumbar spine MRI [DATE]

CLINICAL DATA: Other acute osteomyelitis, multiple sites (HCC)
[XZ] ([XZ]-CM).

EXAM:
MRI LUMBAR SPINE WITHOUT AND WITH CONTRAST
TECHNIQUE: Multiplanar and multiecho pulse sequences of the lumbar spine were
obtained without and with intravenous contrast.
CONTRAST:  10mL GADAVIST GADOBUTROL 1 MMOL/ML IV SOLN

[Series 5: T1 · sagittal · 4.0mm · 0.81mm/px · 5 of 17 slices shown (1 of 2)]
[im 1/17]
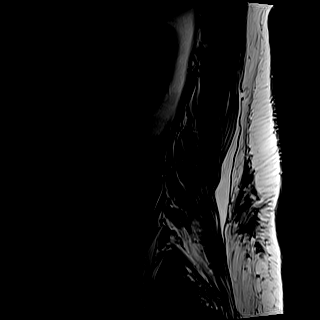
[im 5/17]
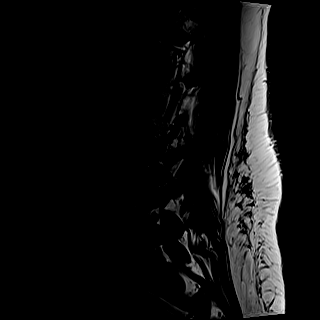
[im 9/17]
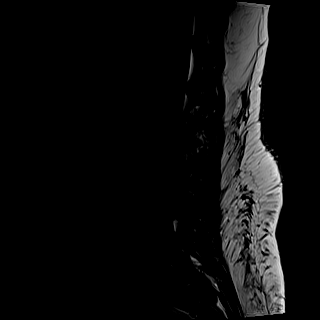
[im 13/17]
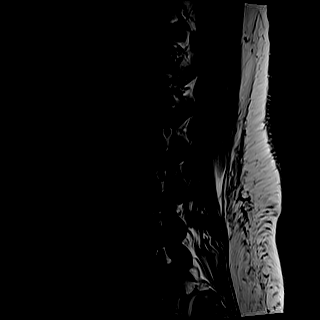
[im 17/17]
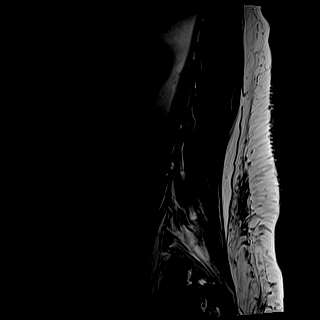

[Series 6: STIR · sagittal · 4.0mm · 0.51mm/px · 2 of 17 slices shown]
[im 1/17]
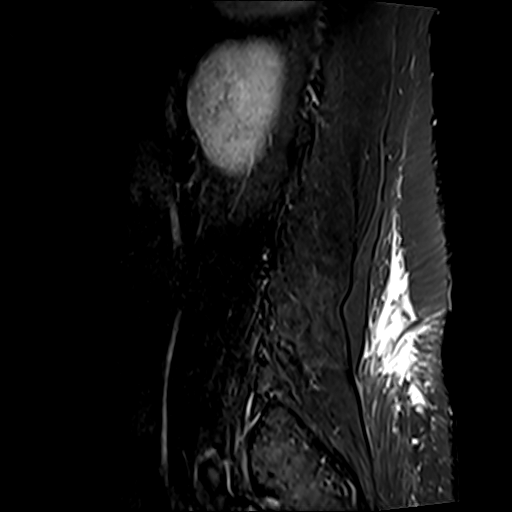
[im 5/17]
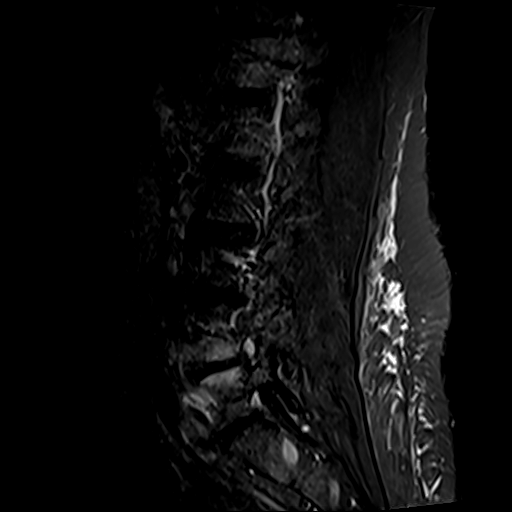

[Series 7: T1 · axial · 4.0mm · 0.39mm/px · z∈[-113,+117]mm · 8 of 43 slices shown (2 of 2)]
[im 1/43]
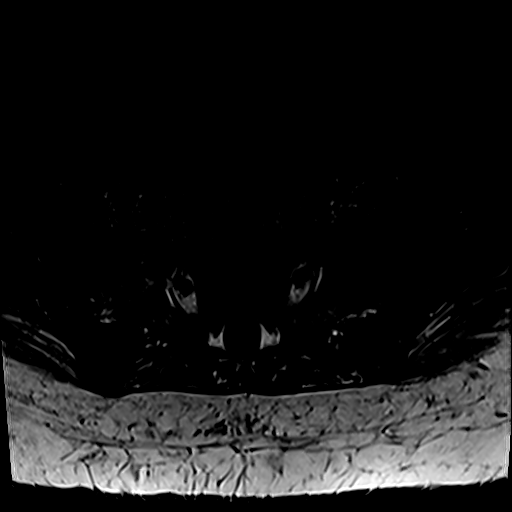
[im 5/43]
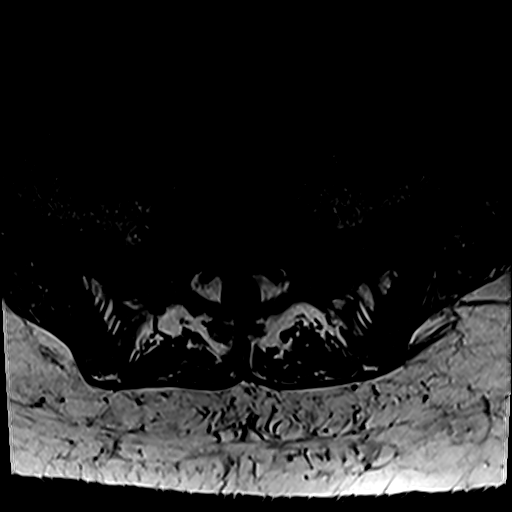
[im 15/43]
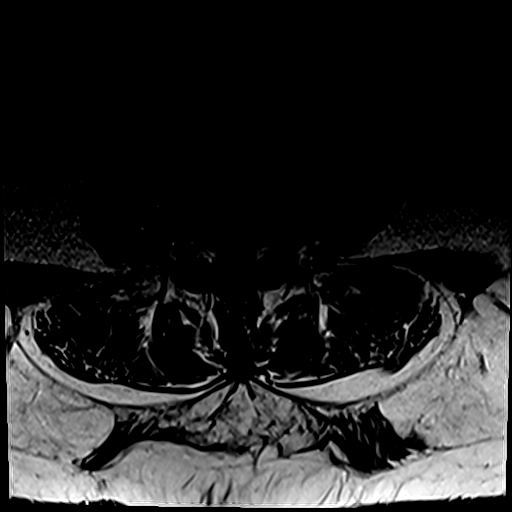
[im 19/43]
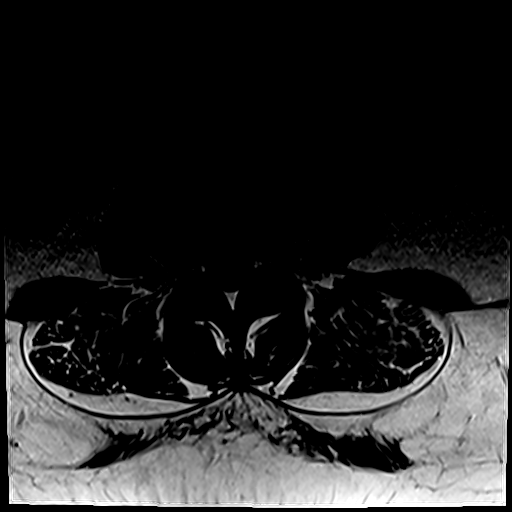
[im 24/43]
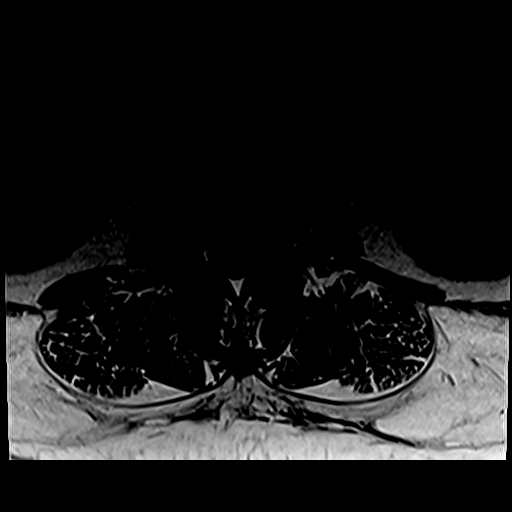
[im 29/43]
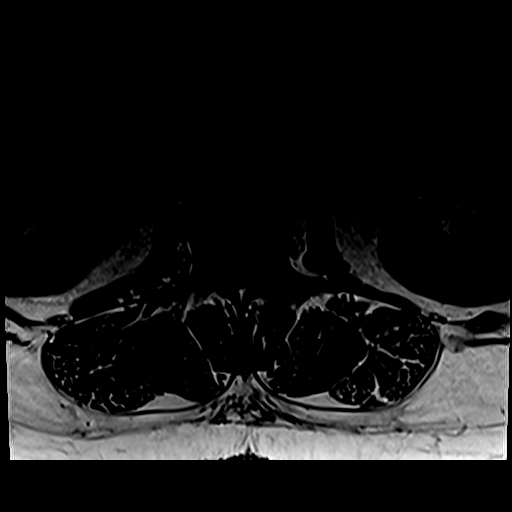
[im 38/43]
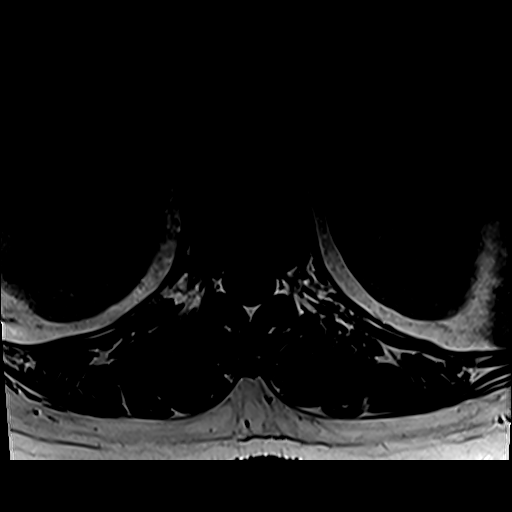
[im 43/43]
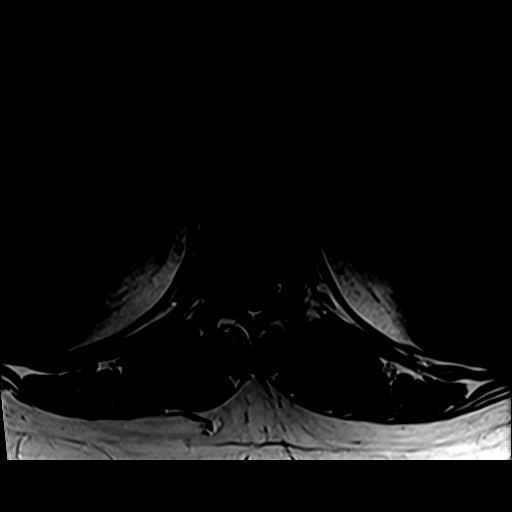

[Series 8: T2 · axial · 4.0mm · 0.62mm/px · z∈[-113,+117]mm · 8 of 43 slices shown]
[im 1/43]
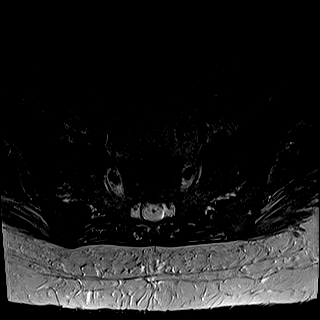
[im 5/43]
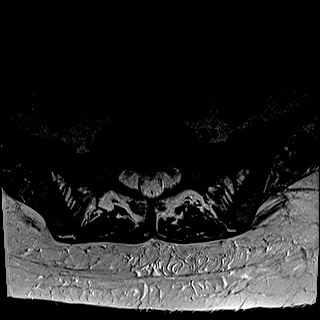
[im 15/43]
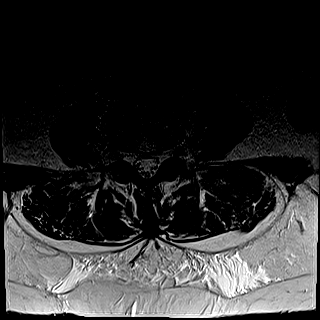
[im 19/43]
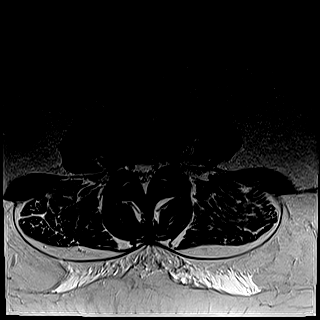
[im 24/43]
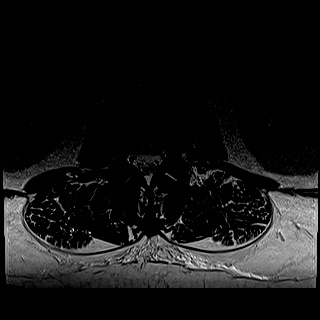
[im 29/43]
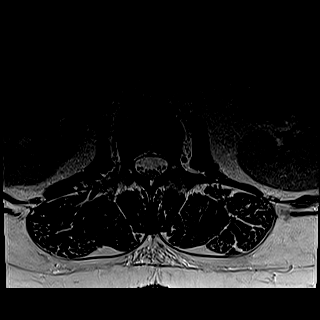
[im 38/43]
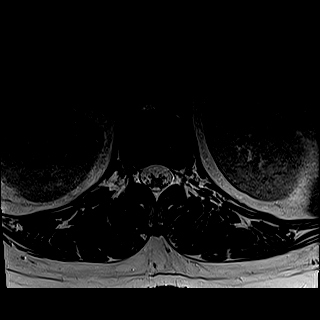
[im 43/43]
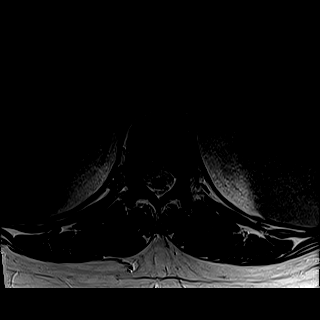

[Series 9: T2 post-contrast · sagittal · 4.0mm · 0.81mm/px · 4 of 17 slices shown]
[im 1/17]
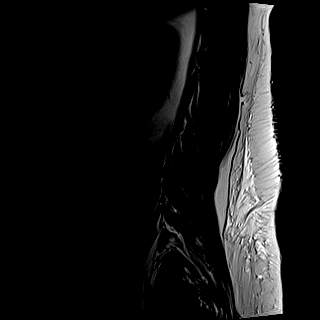
[im 6/17]
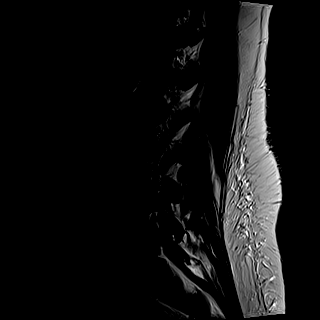
[im 11/17]
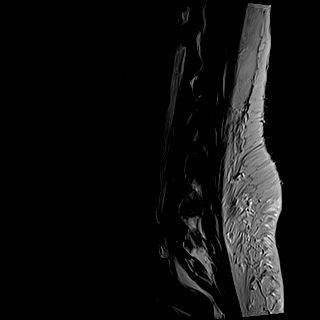
[im 17/17]
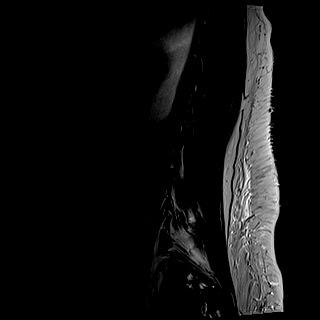

[Series 10: T1 fat-sat post-contrast · sagittal · 4.0mm · 0.81mm/px · 4 of 17 slices shown]
[im 1/17]
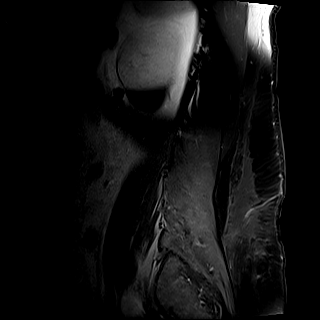
[im 6/17]
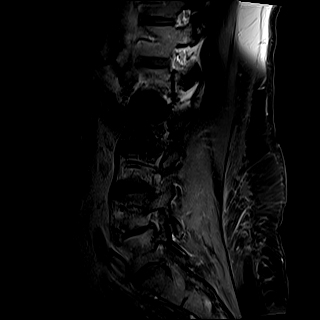
[im 11/17]
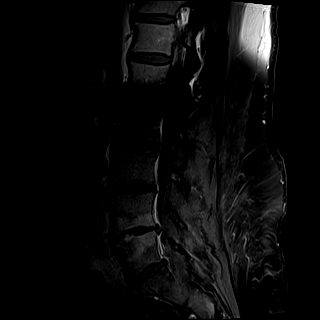
[im 17/17]
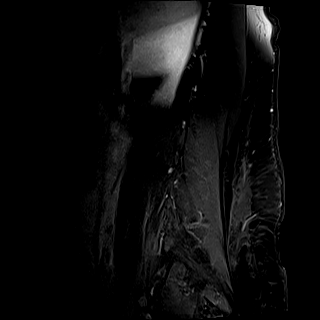

[31 of 48 positions shown; findings below may reference images not displayed]

FINDINGS: Segmentation:  Standard.

Alignment:  Grade 1 retrolisthesis at L3-4 and L4-5

Vertebrae: Improved edema within the L4-5 disc with mild progression
of endplate edema. No clear endplate erosion or height loss. No
epidural abnormality.

Conus medullaris and cauda equina: Conus extends to the L1 level.
Conus and cauda equina appear normal.

Paraspinal and other soft tissues: Negative.

Disc levels:

L1-L2: Normal disc space and facet joints. No spinal canal stenosis.
No neural foraminal stenosis.

L2-L3: New small disc bulge. No spinal canal stenosis. No neural
foraminal stenosis.

L3-L4: New small disc bulge. No spinal canal stenosis. No neural
foraminal stenosis.

L4-L5: Small disc bulge with mild endplate and disc edema. No
spinal canal stenosis. No neural foraminal stenosis.

L5-S1: Disc height loss. No spinal canal stenosis. No neural
foraminal stenosis.

Visualized sacrum: Normal.
IMPRESSION: 1. Decreased L4-5 disc edema with mild progression of endplate
signal changes. No endplate erosion or height loss. This may
represent resolving discitis-osteomyelitis.
2. Multilevel degenerative disc disease without spinal canal or
neural foraminal stenosis.

## 2021-04-17 MED ORDER — GADOBUTROL 1 MMOL/ML IV SOLN
10.0000 mL | Freq: Once | INTRAVENOUS | Status: AC | PRN
  Administered 2021-04-17: 10 mL via INTRAVENOUS

## 2021-04-26 ENCOUNTER — Ambulatory Visit: Payer: Managed Care, Other (non HMO) | Admitting: Internal Medicine

## 2021-04-26 ENCOUNTER — Other Ambulatory Visit: Payer: Self-pay

## 2021-04-26 VITALS — BP 125/84 | HR 104 | Resp 16 | Ht 67.0 in | Wt 258.3 lb

## 2021-04-26 DIAGNOSIS — Z23 Encounter for immunization: Secondary | ICD-10-CM | POA: Diagnosis not present

## 2021-04-26 DIAGNOSIS — M868X9 Other osteomyelitis, unspecified sites: Secondary | ICD-10-CM | POA: Diagnosis not present

## 2021-04-26 NOTE — Progress Notes (Signed)
Patient Active Problem List   Diagnosis Date Noted   Dyslipidemia    Hypoalbuminemia due to protein-calorie malnutrition [facility])    Diabetic peripheral neuropathy ([place])    Acute blood loss anemia    S/P BKA (below knee amputation) unilateral, left ([place]) 03/05/2021   Left below-knee amputee ([place]) 03/05/2021   Adjustment disorder with mixed anxiety and depressed mood    Pressure injury of skin 02/28/2021   Discitis of lumbar region    Subacute osteomyelitis, left ankle and foot ([place])    Diabetes mellitus ([place])    Left leg cellulitis    Bacteremia due to Staphylococcus aureus 02/17/2021   Morbid obesity with BMI of 45.0-49.9, adult ([place]) 01/05/2012    Patient's Medications  New Prescriptions   No medications on file  Previous Medications   ACETAMINOPHEN (TYLENOL) 325 MG TABLET    Take 1-2 tablets (325-650 mg total) by mouth every 6 (six) hours as needed for mild pain (pain score 1-3 or temp > 100.5).   ASCORBIC ACID (VITAMIN C) 1000 MG TABLET    Take 1 tablet (1,000 mg total) by mouth daily.   CITALOPRAM (CELEXA) 40 MG TABLET    Take 1 tablet (40 mg total) by mouth at bedtime.   CYANOCOBALAMIN 1000 MCG TABLET    Take 1/2 tablet (500 mcg total) by mouth daily.   DAPAGLIFLOZIN PROPANEDIOL (FARXIGA) 5 MG TABS TABLET    Take 1 tablet (5 mg total) by mouth daily before breakfast.   DICLOFENAC SODIUM (VOLTAREN) 1 % GEL    Apply 2 g topically 4 (four) times daily.   DOCUSATE SODIUM (COLACE) 100 MG CAPSULE    Take 1 capsule (100 mg total) by mouth daily.   INSULIN DETEMIR (LEVEMIR) 100 UNIT/ML FLEXPEN    Inject 55 Units into the skin at bedtime.   INSULIN LISPRO (HUMALOG) 100 UNIT/ML KWIKPEN    Inject 8 Units into the skin 3 (three) times daily with meals.   LOSARTAN (COZAAR) 25 MG TABLET    Take 1 tablet (25 mg total) by mouth at bedtime.   MAGNESIUM OXIDE (MAG-OX) 400 MG TABLET    Take 1 tablet (400 mg total) by mouth daily.   MEDROXYPROGESTERONE ACETATE (DEPO-PROVERA IM)     Inject into the muscle every 3 (three) months.   METFORMIN (GLUCOPHAGE) 1000 MG TABLET    Take 1 tablet (1,000 mg total) by mouth 2 (two) times daily with a meal.   METHOCARBAMOL (ROBAXIN) 750 MG TABLET    Take 1 tablet (750 mg total) by mouth every 6 (six) hours.   ONDANSETRON (ZOFRAN-ODT) 8 MG DISINTEGRATING TABLET    ondansetron 8 mg disintegrating tablet   PANTOPRAZOLE (PROTONIX) 40 MG TABLET    Take 1 tablet (40 mg total) by mouth daily.   PRAVASTATIN (PRAVACHOL) 40 MG TABLET    Take 1 tablet (40 mg total) by mouth daily.   PREGABALIN (LYRICA) 100 MG CAPSULE    Take 1 capsule (100 mg) by mouth twice daily and 2 capsules (200 mg total) nightly   SITAGLIPTIN (JANUVIA) 100 MG TABLET    Take by mouth.  Modified Medications   No medications on file  Discontinued Medications   CEFAZOLIN (ANCEF) IVPB    Inject 2 g into the vein every 8 (eight) hours. Indication:  Diskitis/bacteremia First Dose: No Last Day of Therapy:  04/14/21 Labs - Once weekly:  CBC/D and BMP, Labs - Every other week:  ESR and CRP Method  of administration: IV Push Method of administration may be changed at the discretion of home infusion pharmacist based upon assessment of the patient and/or caregiver's ability to self-administer the medication ordered.  HPI: 46 year old female with history of diabetes(A1c 12.9 on 03/15/2018), hypertension, obesity presents for hospital follow-up.  Patient was admitted to [facility] 11/25-12/13 for left lower extremity wound.  She was found to have blood cultures positive +MSSA.  MRI of left foot showed extensive acute osteomyelitis throughout left hindfoot and forefoot.  Underwent left BKA on 02/19/21.  During hospitalization she complained of back pain and found to have findings concerning for L4-L5 osteomyelitis on MRI on 02/28/2021.  TEE did not show vegetations.  ID was consulted and plan for 8 weeks of antibiotic treatment from negative blood cultures with cefazolin for vertebral  osteomyelitis/MSSA bacteremia  Interim: Last seen on 04/07/21 and plan was to complete 8 weeks of antibiotics with cefazolin EOT 04/16/21. MRI spine ordered which showed resolving L4-L5 discitis//osteomyelitis  Subjective: Today, pt reports feeling well. Denies back pain, fever, chills, N/V/D.  Review of Systems: Review of Systems  All other systems reviewed and are negative.  Past Medical History:  Diagnosis Date   Hyperlipidemia    Hypertension    Obesity    Type 2 diabetes mellitus ([place])     Social History   Tobacco Use   Smoking status: Former    Types: Cigarettes    Quit date: 12/11/2010    Years since quitting: 10.3   Smokeless tobacco: Never  Substance Use Topics   Alcohol use: No   Drug use: No    Family History  Problem Relation Age of Onset   Hypothyroidism Mother    Diabetes Mother    Diabetes Maternal Grandmother    Breast cancer Paternal Grandmother     Allergies  Allergen Reactions   Sulfa Antibiotics Other (See Comments)    Makes sick   Lisinopril Cough    Health Maintenance  Topic Date Due   HEMOGLOBIN A1C  Never done   Pneumococcal Vaccine 62-52 Years old (1 - PCV) Never done   FOOT EXAM  Never done   OPHTHALMOLOGY EXAM  Never done   Hepatitis C Screening  Never done   PAP SMEAR-Modifier  Never done   COLONOSCOPY (Pts 45-6yr Insurance coverage will need to be confirmed)  Never done   COVID-19 Vaccine (3 - Booster for Janssen series) 06/05/2020   INFLUENZA VACCINE  11/09/2020   TETANUS/TDAP  12/04/2029   HIV Screening  Completed   HPV VACCINES  Aged Out    Objective:  Vitals:   04/26/21 1054  BP: 125/84  Pulse: (!) 104  Resp: 16  SpO2: 98%  Weight: 258 lb 4.8 oz (117.2 kg)  Height: _0  (1.702 m)   Body mass index is 40.46 kg/m.  Physical Exam Constitutional:      Appearance: Normal appearance.  HENT:     Head: Normocephalic and atraumatic.     Right Ear: Tympanic membrane normal.     Left Ear: Tympanic membrane normal.      Nose: Nose normal.     Mouth/Throat:     Mouth: Mucous membranes are moist.  Eyes:     Extraocular Movements: Extraocular movements intact.     Conjunctiva/sclera: Conjunctivae normal.     Pupils: Pupils are equal, round, and reactive to light.  Cardiovascular:     Rate and Rhythm: Normal rate and regular rhythm.     Heart sounds: No murmur heard.  No friction rub. No gallop.  Pulmonary:     Effort: Pulmonary effort is normal.     Breath sounds: Normal breath sounds.  Abdominal:     General: Abdomen is flat.     Palpations: Abdomen is soft.  Musculoskeletal:     Comments: LBKA  Skin:    General: Skin is warm and dry.  Neurological:     General: No focal deficit present.     Mental Status: She is alert and oriented to person, place, and time.  Psychiatric:        Mood and Affect: Mood normal.    Lab Results Lab Results  Component Value Date   WBC 5.9 03/22/2021   HGB 8.9 (L) 03/22/2021   HCT 27.9 (L) 03/22/2021   MCV 88.0 03/22/2021   PLT 231 03/22/2021    Lab Results  Component Value Date   CREATININE 0.43 (L) 03/22/2021   BUN 18 03/22/2021   NA 139 03/22/2021   K 3.6 03/22/2021   CL 105 03/22/2021   CO2 26 03/22/2021    Lab Results  Component Value Date   ALT 7 03/22/2021   AST 14 (L) 03/22/2021   ALKPHOS 75 03/22/2021   BILITOT 0.4 03/22/2021    No results found for: CHOL, HDL, LDLCALC, LDLDIRECT, TRIG, CHOLHDL Lab Results  Component Value Date   LABRPR NON REACTIVE 02/22/2007   No results found for: HIV1RNAQUANT, HIV1RNAVL, CD4TABS   Problem List Items Addressed This Visit   None  #MSSA bacteremia with L4-L5 vertebral osteomyelitis 2/2 left lower extremity osteomyelitis SP BKA  #SP cefazolin x 8 weeks(EOT 04/16/21) -MRI on 1/7 showed resolving osteomyelitis -Pt denied back pain -Follow-up PRN. Counseled to contact clinic or go to ED for alarm symptoms of worsening back pain, fever, chills.    Laurice Record, MD [facility] 04/26/2021, 10:58 AM

## 2021-04-27 ENCOUNTER — Ambulatory Visit (INDEPENDENT_AMBULATORY_CARE_PROVIDER_SITE_OTHER): Payer: Managed Care, Other (non HMO) | Admitting: Family

## 2021-04-27 ENCOUNTER — Telehealth: Payer: Self-pay | Admitting: Orthopedic Surgery

## 2021-04-27 DIAGNOSIS — Z89512 Acquired absence of left leg below knee: Secondary | ICD-10-CM

## 2021-04-27 NOTE — Telephone Encounter (Signed)
Pt submitted medical release form, short term disability form, and $25.00 cash payment to [employer]. 04/27/21

## 2021-04-28 ENCOUNTER — Encounter: Payer: Self-pay | Admitting: Family

## 2021-04-28 NOTE — Progress Notes (Signed)
° °  Post-Op Visit Note   Patient: Lori Schmidt           Date of Birth: 09-29-1975           MRN: [id number] Visit Date: 04/27/2021 PCP: Nena Polio, NP  Chief Complaint:  Chief Complaint  Patient presents with   Left Leg - Routine Post Op    Left BKA 02/19/21 Wearing limb protector when out in public Wearing shrinker Photo taken    HPI:  HPI The patient is a 46 year old woman seen status post left below-knee amputation.  Has had slow healing of some remaining open areas. No concerns of pain.   Ortho Exam On examination of the left residual limb. this is well consolidated and is healing well. 6 islands of eschar. Medially these are superficial. Centrally two quarter sized areas with depth. Are 8 mm deep. With fibrinous tissue and 50% eschar. No drainage. No erythema or warmth.   Visit Diagnoses:  1. History of left below knee amputation ([place])     Plan: Continue daily Dial soap cleansing.  Will pack the areas with depth with iodosorb and gauze. Shrinker with direct skin contact.    Follow-Up Instructions: Return in about 3 weeks (around 05/18/2021).   Imaging: No results found.  Orders:  No orders of the defined types were placed in this encounter.  No orders of the defined types were placed in this encounter.    PMFS History: Patient Active Problem List   Diagnosis Date Noted   Dyslipidemia    Hypoalbuminemia due to protein-calorie malnutrition (HCC)    Diabetic peripheral neuropathy (HCC)    Acute blood loss anemia    S/P BKA (below knee amputation) unilateral, left ([place]) 03/05/2021   Left below-knee amputee ([place]) 03/05/2021   Adjustment disorder with mixed anxiety and depressed mood    Pressure injury of skin 02/28/2021   Discitis of lumbar region    Subacute osteomyelitis, left ankle and foot ([place])    Diabetes mellitus ([place])    Left leg cellulitis    Bacteremia due to Staphylococcus aureus 02/17/2021   Morbid obesity with BMI of 45.0-49.9, adult  ([place]) 01/05/2012   Past Medical History:  Diagnosis Date   Hyperlipidemia    Hypertension    Obesity    Type 2 diabetes mellitus ([place])     Family History  Problem Relation Age of Onset   Hypothyroidism Mother    Diabetes Mother    Diabetes Maternal Grandmother    Breast cancer Paternal Grandmother     Past Surgical History:  Procedure Laterality Date   AMPUTATION Left 02/19/2021   Procedure: AMPUTATION BELOW KNEE;  Surgeon: Newt Minion, MD;  Location: [place];  Service: Orthopedics;  Laterality: Left;   BREAST CYST EXCISION Right 05/2018   four cysts removed on right breast/axilla area   TEE WITHOUT CARDIOVERSION N/A 02/23/2021   Procedure: TRANSESOPHAGEAL ECHOCARDIOGRAM (TEE);  Surgeon: Pixie Casino, MD;  Location: [facility] ENDOSCOPY;  Service: Cardiovascular;  Laterality: N/A;   Social History   Occupational History   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

## 2021-04-28 NOTE — Progress Notes (Signed)
Post-Op Visit Note   Patient: Lori Schmidt           Date of Birth: 10-27-1975           MRN: [id number] Visit Date: 04/16/2021 PCP: Primus Bravo, NP  Chief Complaint:  Chief Complaint  Patient presents with   Left Leg - Routine Post Op    02/19/21 left BKA     HPI:  HPI The patient is a 46 year old woman who is seen status post left below-knee amputation November 11 her staples were harvested December 28.  Today she is using a shrinker with direct skin contact using a limb protector as well.  She has some areas of eschar along her incision she is concerned about some redness and some yellow tissue in some of the areas of her incision that have not yet healed Ortho Exam On examination of the left residual limb this is consolidating well.  Her incision is healing slowly there are some areas of eschar anteriorly as well as laterally.  There is some mild erythema surrounding these ulcerative areas there is no active drainage there is no warmth no odor 3 areas that have not yet healed these are about 2 cm in diameter filled in with 50% eschar 50% fibrinous exudative tissue.  Visit Diagnoses: No diagnosis found.  Plan: Continue daily dose of cleansing.  Shrinker with direct skin contact.  Reassurance provided.  Follow-Up Instructions: Return in about 1 week (around 04/23/2021).   Imaging: No results found.  Orders:  No orders of the defined types were placed in this encounter.  No orders of the defined types were placed in this encounter.    PMFS History: Patient Active Problem List   Diagnosis Date Noted   Dyslipidemia    Hypoalbuminemia due to protein-calorie malnutrition (HCC)    Diabetic peripheral neuropathy (HCC)    Acute blood loss anemia    S/P BKA (below knee amputation) unilateral, left (HCC) 03/05/2021   Left below-knee amputee (HCC) 03/05/2021   Adjustment disorder with mixed anxiety and depressed mood    Pressure injury of skin 02/28/2021    Discitis of lumbar region    Subacute osteomyelitis, left ankle and foot (HCC)    Diabetes mellitus (HCC)    Left leg cellulitis    Bacteremia due to Staphylococcus aureus 02/17/2021   Morbid obesity with BMI of 45.0-49.9, adult (HCC) 01/05/2012   Past Medical History:  Diagnosis Date   Hyperlipidemia    Hypertension    Obesity    Type 2 diabetes mellitus (HCC)     Family History  Problem Relation Age of Onset   Hypothyroidism Mother    Diabetes Mother    Diabetes Maternal Grandmother    Breast cancer Paternal Grandmother     Past Surgical History:  Procedure Laterality Date   AMPUTATION Left 02/19/2021   Procedure: AMPUTATION BELOW KNEE;  Surgeon: Nadara Mustard, MD;  Location: [facility] OR;  Service: Orthopedics;  Laterality: Left;   BREAST CYST EXCISION Right 05/2018   four cysts removed on right breast/axilla area   TEE WITHOUT CARDIOVERSION N/A 02/23/2021   Procedure: TRANSESOPHAGEAL ECHOCARDIOGRAM (TEE);  Surgeon: Chrystie Nose, MD;  Location: [facility] ENDOSCOPY;  Service: Cardiovascular;  Laterality: N/A;   Social History   Occupational History   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

## 2021-05-20 ENCOUNTER — Ambulatory Visit (INDEPENDENT_AMBULATORY_CARE_PROVIDER_SITE_OTHER): Payer: Managed Care, Other (non HMO) | Admitting: Orthopedic Surgery

## 2021-05-20 ENCOUNTER — Encounter: Payer: Self-pay | Admitting: Orthopedic Surgery

## 2021-05-20 ENCOUNTER — Other Ambulatory Visit: Payer: Self-pay

## 2021-05-20 DIAGNOSIS — Z89512 Acquired absence of left leg below knee: Secondary | ICD-10-CM

## 2021-05-20 NOTE — Progress Notes (Signed)
Office Visit Note   Patient: Lori Schmidt           Date of Birth: 1975/06/22           MRN: [id number] Visit Date: 05/20/2021              Requested by: Nena Polio, NP [address] [place],  [place] [address] PCP: Nena Polio, NP  Chief Complaint  Patient presents with   Left Leg - Routine Post Op    02/19/21 left BKA       HPI: Patient is a 46 year old woman is 3 months status post left transtibial amputation.  Patient was at [facility] this morning but is unable to proceed with casting secondary to 4 open wounds over the lateral incision.  Assessment & Plan: Visit Diagnoses:  1. History of left below knee amputation ([place])     Plan: Patient was given instructions for Dial soap cleansing and gentle debridement of the fibrinous exudative tissue.  She states she does have Iodosorb recommended she use the Iodosorb every other day and continue with the compression stocking.  Follow-Up Instructions: Return in about 4 weeks (around 06/17/2021).   Ortho Exam  Patient is alert, oriented, no adenopathy, well-dressed, normal affect, normal respiratory effort. Examination patient has 4 open wounds laterally over the residual limb approximately 1 cm diameter 1 cm deep there is no tenderness to palpation around the wounds there is no cellulitis no odor no drainage after general debridement the wounds have 50% granulation tissue.  Imaging: No results found.     Labs: Lab Results  Component Value Date   ESRSEDRATE 15 03/22/2021   ESRSEDRATE 25 (H) 03/15/2021   ESRSEDRATE 67 (H) 02/26/2021   CRP 0.7 03/22/2021   CRP 0.9 03/15/2021   CRP 7.5 (H) 02/26/2021   REPTSTATUS 02/24/2021 FINAL 02/19/2021   CULT  02/19/2021    NO GROWTH 5 DAYS Performed at [facility], [place] [address], [place],  [address]    LABORGA STAPHYLOCOCCUS AUREUS 02/16/2021     Lab Results  Component Value Date   ALBUMIN 2.7 (L) 03/22/2021   ALBUMIN 2.8 (L) 03/15/2021    ALBUMIN 2.3 (L) 03/08/2021    Lab Results  Component Value Date   MG 2.1 02/20/2021   MG 2.0 02/18/2021   No results found for: VD25OH  No results found for: PREALBUMIN CBC EXTENDED Latest Ref Rng & Units 03/22/2021 03/15/2021 03/08/2021  WBC 4.0 - 10.5 K/uL 5.9 5.7 6.3  RBC 3.87 - 5.11 MIL/uL 3.17(L) 3.27(L) 3.31(L)  HGB 12.0 - 15.0 g/dL 8.9(L) 9.1(L) 9.5(L)  HCT 36.0 - 46.0 % 27.9(L) 28.4(L) 29.0(L)  PLT 150 - 400 K/uL 231 258 347  NEUTROABS 1.7 - 7.7 K/uL 3.2 2.8 2.8  LYMPHSABS 0.7 - 4.0 K/uL 1.3 1.3 1.7     There is no height or weight on file to calculate BMI.  Orders:  No orders of the defined types were placed in this encounter.  No orders of the defined types were placed in this encounter.    Procedures: No procedures performed  Clinical Data: No additional findings.  ROS:  All other systems negative, except as noted in the HPI. Review of Systems  Objective: Vital Signs: There were no vitals taken for this visit.  Specialty Comments:  No specialty comments available.  PMFS History: Patient Active Problem List   Diagnosis Date Noted   Dyslipidemia    Hypoalbuminemia due to protein-calorie malnutrition ([place])    Diabetic peripheral  neuropathy ([place])    Acute blood loss anemia    S/P BKA (below knee amputation) unilateral, left ([place]) 03/05/2021   Left below-knee amputee ([place]) 03/05/2021   Adjustment disorder with mixed anxiety and depressed mood    Pressure injury of skin 02/28/2021   Discitis of lumbar region    Subacute osteomyelitis, left ankle and foot ([place])    Diabetes mellitus ([place])    Left leg cellulitis    Bacteremia due to Staphylococcus aureus 02/17/2021   Morbid obesity with BMI of 45.0-49.9, adult ([place]) 01/05/2012   Past Medical History:  Diagnosis Date   Hyperlipidemia    Hypertension    Obesity    Type 2 diabetes mellitus ([place])     Family History  Problem Relation Age of Onset   Hypothyroidism Mother    Diabetes Mother     Diabetes Maternal Grandmother    Breast cancer Paternal Grandmother     Past Surgical History:  Procedure Laterality Date   AMPUTATION Left 02/19/2021   Procedure: AMPUTATION BELOW KNEE;  Surgeon: Newt Minion, MD;  Location: [place];  Service: Orthopedics;  Laterality: Left;   BREAST CYST EXCISION Right 05/2018   four cysts removed on right breast/axilla area   TEE WITHOUT CARDIOVERSION N/A 02/23/2021   Procedure: TRANSESOPHAGEAL ECHOCARDIOGRAM (TEE);  Surgeon: Pixie Casino, MD;  Location: [facility] ENDOSCOPY;  Service: Cardiovascular;  Laterality: N/A;   Social History   Occupational History   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

## 2021-05-25 ENCOUNTER — Encounter: Payer: Managed Care, Other (non HMO) | Admitting: Orthopedic Surgery

## 2021-06-17 ENCOUNTER — Other Ambulatory Visit: Payer: Self-pay

## 2021-06-17 ENCOUNTER — Ambulatory Visit (INDEPENDENT_AMBULATORY_CARE_PROVIDER_SITE_OTHER): Payer: Managed Care, Other (non HMO) | Admitting: Orthopedic Surgery

## 2021-06-17 DIAGNOSIS — Z89512 Acquired absence of left leg below knee: Secondary | ICD-10-CM | POA: Diagnosis not present

## 2021-06-20 ENCOUNTER — Encounter: Payer: Self-pay | Admitting: Orthopedic Surgery

## 2021-06-20 NOTE — Progress Notes (Signed)
? ?Office Visit Note ?  ?Patient: Lori Schmidt           ?Date of Birth: [birth year]           ?MRN: [id number] ?Visit Date: 06/17/2021 ?             ?Requested by: Nena Polio, NP ?[address] ?[place],  [place] [address] ?PCP: Nena Polio, NP ? ?Chief Complaint  ?Patient presents with  ? Left Leg - Follow-up  ?  02/19/21 left BKA  ? ? ? ? ?HPI: ?Patient is a 46 year old woman who is 4 months status post left transtibial amputation.  She states her primary care physician obtained wound cultures.  She is currently working with Museum/gallery curator for prosthetic fitting. ? ?Assessment & Plan: ?Visit Diagnoses:  ?1. History of left below knee amputation ([place])   ? ? ?Plan: Follow-up with [facility] for prosthetic fitting in about a month.  Continue with the compression shrinker. ? ?Follow-Up Instructions: Return in about 4 weeks (around 07/15/2021).  ? ?Ortho Exam ? ?Patient is alert, oriented, no adenopathy, well-dressed, normal affect, normal respiratory effort. ?Examination there are for superficial ulcers on the residual limb there is healthy granulation tissue the ulcers are flat.  There is no redness no cellulitis patient is currently in a 3 XL shrinker. ? ?Imaging: ?No results found. ?No images are attached to the encounter. ? ?Labs: ?Lab Results  ?Component Value Date  ? ESRSEDRATE 15 03/22/2021  ? ESRSEDRATE 25 (H) 03/15/2021  ? ESRSEDRATE 67 (H) 02/26/2021  ? CRP 0.7 03/22/2021  ? CRP 0.9 03/15/2021  ? CRP 7.5 (H) 02/26/2021  ? REPTSTATUS 02/24/2021 FINAL 02/19/2021  ? CULT  02/19/2021  ?  NO GROWTH 5 DAYS ?Performed at [facility], [place] [address], [place], [place] [address] ?  ? [place] STAPHYLOCOCCUS AUREUS 02/16/2021  ? ? ? ?Lab Results  ?Component Value Date  ? ALBUMIN 2.7 (L) 03/22/2021  ? ALBUMIN 2.8 (L) 03/15/2021  ? ALBUMIN 2.3 (L) 03/08/2021  ? ? ?Lab Results  ?Component Value Date  ? MG 2.1 02/20/2021  ? MG 2.0 02/18/2021  ? ?No results found for: VD25OH ? ?No results found for: PREALBUMIN ?CBC  EXTENDED Latest Ref Rng & Units 03/22/2021 03/15/2021 03/08/2021  ?WBC 4.0 - 10.5 K/uL 5.9 5.7 6.3  ?RBC 3.87 - 5.11 MIL/uL 3.17(L) 3.27(L) 3.31(L)  ?HGB 12.0 - 15.0 g/dL 8.9(L) 9.1(L) 9.5(L)  ?HCT 36.0 - 46.0 % 27.9(L) 28.4(L) 29.0(L)  ?PLT 150 - 400 K/uL 231 258 347  ?NEUTROABS 1.7 - 7.7 K/uL 3.2 2.8 2.8  ?LYMPHSABS 0.7 - 4.0 K/uL 1.3 1.3 1.7  ? ? ? ?There is no height or weight on file to calculate BMI. ? ?Orders:  ?No orders of the defined types were placed in this encounter. ? ?No orders of the defined types were placed in this encounter. ? ? ? Procedures: ?No procedures performed ? ?Clinical Data: ?No additional findings. ? ?ROS: ? ?All other systems negative, except as noted in the HPI. ?Review of Systems ? ?Objective: ?Vital Signs: There were no vitals taken for this visit. ? ?Specialty Comments:  ?No specialty comments available. ? ?PMFS History: ?Patient Active Problem List  ? Diagnosis Date Noted  ? Dyslipidemia   ? Hypoalbuminemia due to protein-calorie malnutrition ([place])   ? Diabetic peripheral neuropathy ([place])   ? Acute blood loss anemia   ? S/P BKA (below knee amputation) unilateral, left ([place]) 03/05/2021  ? Left below-knee amputee ([place]) 03/05/2021  ? Adjustment disorder  with mixed anxiety and depressed mood   ? Pressure injury of skin 02/28/2021  ? Discitis of lumbar region   ? Subacute osteomyelitis, left ankle and foot ([place])   ? Diabetes mellitus ([place])   ? Left leg cellulitis   ? Bacteremia due to Staphylococcus aureus 02/17/2021  ? Morbid obesity with BMI of 45.0-49.9, adult ([place]) 01/05/2012  ? ?Past Medical History:  ?Diagnosis Date  ? Hyperlipidemia   ? Hypertension   ? Obesity   ? Type 2 diabetes mellitus ([place])   ?  ?Family History  ?Problem Relation Age of Onset  ? Hypothyroidism Mother   ? Diabetes Mother   ? Diabetes Maternal Grandmother   ? Breast cancer Paternal Grandmother   ?  ?Past Surgical History:  ?Procedure Laterality Date  ? AMPUTATION Left 02/19/2021  ? Procedure: AMPUTATION BELOW  KNEE;  Surgeon: Newt Minion, MD;  Location: [place];  Service: Orthopedics;  Laterality: Left;  ? BREAST CYST EXCISION Right 05/2018  ? four cysts removed on right breast/axilla area  ? TEE WITHOUT CARDIOVERSION N/A 02/23/2021  ? Procedure: TRANSESOPHAGEAL ECHOCARDIOGRAM (TEE);  Surgeon: Pixie Casino, MD;  Location: [place];  Service: Cardiovascular;  Laterality: N/A;  ? ?Social History  ? ?Occupational History  ? 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  Employer: [employer]  ?Tobacco Use  ? Smoking status: Former  ?  Types: Cigarettes  ?  Quit date: 12/11/2010  ?  Years since quitting: 10.5  ? Smokeless tobacco: Never  ?Vaping Use  ? Vaping Use: Not on file  ?Substance and Sexual Activity  ? Alcohol use: No  ? Drug use: No  ? Sexual activity: Not Currently  ? ? ? ? ? ?

## 2021-06-21 ENCOUNTER — Encounter: Payer: Managed Care, Other (non HMO) | Admitting: Physical Medicine and Rehabilitation

## 2021-07-15 ENCOUNTER — Ambulatory Visit (INDEPENDENT_AMBULATORY_CARE_PROVIDER_SITE_OTHER): Payer: Managed Care, Other (non HMO) | Admitting: Orthopedic Surgery

## 2021-07-15 DIAGNOSIS — Z89512 Acquired absence of left leg below knee: Secondary | ICD-10-CM | POA: Diagnosis not present

## 2021-07-16 ENCOUNTER — Encounter: Payer: Self-pay | Admitting: Orthopedic Surgery

## 2021-07-16 NOTE — Progress Notes (Signed)
? ?Office Visit Note ?  ?Patient: Lori Schmidt           ?Date of Birth: 01/14/1976           ?MRN: [id number] ?Visit Date: 07/15/2021 ?             ?Requested by: Primus Bravo, NP ?[address] ?[place],  [place] [address] ?PCP: Primus Bravo, NP ? ?Chief Complaint  ?Patient presents with  ? Left Leg - Follow-up  ?  02/19/21 left BKA  ? ? ? ? ?HPI: ?Patient is a 46 year old woman 5 months status post left transtibial amputation.  Patient states she was casted this morning for her prosthesis. ? ?Assessment & Plan: ?Visit Diagnoses:  ?1. History of left below knee amputation (HCC)   ? ? ?Plan: Patient wants to return to work on June 1.  We will reevaluate in 4 weeks. ? ?Follow-Up Instructions: Return in about 4 weeks (around 08/12/2021).  ? ?Ortho Exam ? ?Patient is alert, oriented, no adenopathy, well-dressed, normal affect, normal respiratory effort. ?Examination the residual limb is healing nicely.  Continue with the compression sock start physical therapy once the prosthesis is obtained.  Anticipate she can return to work June 1. ? ?Imaging: ?No results found. ?No images are attached to the encounter. ? ?Labs: ?Lab Results  ?Component Value Date  ? ESRSEDRATE 15 03/22/2021  ? ESRSEDRATE 25 (H) 03/15/2021  ? ESRSEDRATE 67 (H) 02/26/2021  ? CRP 0.7 03/22/2021  ? CRP 0.9 03/15/2021  ? CRP 7.5 (H) 02/26/2021  ? REPTSTATUS 02/24/2021 FINAL 02/19/2021  ? CULT  02/19/2021  ?  NO GROWTH 5 DAYS ?Performed at [facility], [address], [place], [place] [address] ?  ? LABORGA STAPHYLOCOCCUS AUREUS 02/16/2021  ? ? ? ?Lab Results  ?Component Value Date  ? ALBUMIN 2.7 (L) 03/22/2021  ? ALBUMIN 2.8 (L) 03/15/2021  ? ALBUMIN 2.3 (L) 03/08/2021  ? ? ?Lab Results  ?Component Value Date  ? MG 2.1 02/20/2021  ? MG 2.0 02/18/2021  ? ?No results found for: VD25OH ? ?No results found for: PREALBUMIN ? ?  Latest Ref Rng & Units 03/22/2021  ?  3:09 AM 03/15/2021  ?  3:36 AM 03/08/2021  ?  3:07 AM  ?CBC EXTENDED  ?WBC  4.0 - 10.5 K/uL 5.9   5.7   6.3    ?RBC 3.87 - 5.11 MIL/uL 3.17   3.27   3.31    ?Hemoglobin 12.0 - 15.0 g/dL 8.9   9.1   9.5    ?HCT 36.0 - 46.0 % 27.9   28.4   29.0    ?Platelets 150 - 400 K/uL 231   258   347    ?NEUT# 1.7 - 7.7 K/uL 3.2   2.8   2.8    ?Lymph# 0.7 - 4.0 K/uL 1.3   1.3   1.7    ? ? ? ?There is no height or weight on file to calculate BMI. ? ?Orders:  ?No orders of the defined types were placed in this encounter. ? ?No orders of the defined types were placed in this encounter. ? ? ? Procedures: ?No procedures performed ? ?Clinical Data: ?No additional findings. ? ?ROS: ? ?All other systems negative, except as noted in the HPI. ?Review of Systems ? ?Objective: ?Vital Signs: There were no vitals taken for this visit. ? ?Specialty Comments:  ?No specialty comments available. ? ?PMFS History: ?Patient Active Problem List  ? Diagnosis Date Noted  ? Dyslipidemia   ?  Hypoalbuminemia due to protein-calorie malnutrition (HCC)   ? Diabetic peripheral neuropathy (HCC)   ? Acute blood loss anemia   ? S/P BKA (below knee amputation) unilateral, left (HCC) 03/05/2021  ? Left below-knee amputee (HCC) 03/05/2021  ? Adjustment disorder with mixed anxiety and depressed mood   ? Pressure injury of skin 02/28/2021  ? Discitis of lumbar region   ? Subacute osteomyelitis, left ankle and foot (HCC)   ? Diabetes mellitus (HCC)   ? Left leg cellulitis   ? Bacteremia due to Staphylococcus aureus 02/17/2021  ? Morbid obesity with BMI of 45.0-49.9, adult (HCC) 01/05/2012  ? ?Past Medical History:  ?Diagnosis Date  ? Hyperlipidemia   ? Hypertension   ? Obesity   ? Type 2 diabetes mellitus (HCC)   ?  ?Family History  ?Problem Relation Age of Onset  ? Hypothyroidism Mother   ? Diabetes Mother   ? Diabetes Maternal Grandmother   ? Breast cancer Paternal Grandmother   ?  ?Past Surgical History:  ?Procedure Laterality Date  ? AMPUTATION Left 02/19/2021  ? Procedure: AMPUTATION BELOW KNEE;  Surgeon: Nadara Mustard, MD;  Location: [facility]  OR;  Service: Orthopedics;  Laterality: Left;  ? BREAST CYST EXCISION Right 05/2018  ? four cysts removed on right breast/axilla area  ? TEE WITHOUT CARDIOVERSION N/A 02/23/2021  ? Procedure: TRANSESOPHAGEAL ECHOCARDIOGRAM (TEE);  Surgeon: Chrystie Nose, MD;  Location: [facility] ENDOSCOPY;  Service: Cardiovascular;  Laterality: N/A;  ? ?Social History  ? ?Occupational History  ? 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  Employer: [employer]  ?Tobacco Use  ? Smoking status: Former  ?  Types: Cigarettes  ?  Quit date: 12/11/2010  ?  Years since quitting: 10.6  ? Smokeless tobacco: Never  ?Vaping Use  ? Vaping Use: Not on file  ?Substance and Sexual Activity  ? Alcohol use: No  ? Drug use: No  ? Sexual activity: Not Currently  ? ? ? ? ? ?

## 2021-08-02 ENCOUNTER — Telehealth: Payer: Self-pay | Admitting: Orthopedic Surgery

## 2021-08-02 ENCOUNTER — Other Ambulatory Visit: Payer: Self-pay | Admitting: Orthopedic Surgery

## 2021-08-02 DIAGNOSIS — Z89512 Acquired absence of left leg below knee: Secondary | ICD-10-CM

## 2021-08-02 NOTE — Telephone Encounter (Signed)
Patient would like a referral for outpatient therapy to see Zella Ball her for PT.  ?

## 2021-08-02 NOTE — Telephone Encounter (Signed)
Referral put in for upstairs PT w/ Robin ?

## 2021-08-12 ENCOUNTER — Ambulatory Visit: Payer: Managed Care, Other (non HMO) | Admitting: Orthopedic Surgery

## 2021-08-17 ENCOUNTER — Encounter: Payer: Managed Care, Other (non HMO) | Admitting: Physical Therapy

## 2021-08-17 ENCOUNTER — Other Ambulatory Visit: Payer: Self-pay

## 2021-08-17 ENCOUNTER — Ambulatory Visit (INDEPENDENT_AMBULATORY_CARE_PROVIDER_SITE_OTHER): Payer: Managed Care, Other (non HMO) | Admitting: Physical Therapy

## 2021-08-17 ENCOUNTER — Encounter: Payer: Self-pay | Admitting: Physical Therapy

## 2021-08-17 DIAGNOSIS — M6281 Muscle weakness (generalized): Secondary | ICD-10-CM | POA: Diagnosis not present

## 2021-08-17 DIAGNOSIS — R2681 Unsteadiness on feet: Secondary | ICD-10-CM | POA: Diagnosis not present

## 2021-08-17 DIAGNOSIS — R2689 Other abnormalities of gait and mobility: Secondary | ICD-10-CM

## 2021-08-17 NOTE — Therapy (Signed)
? ?OUTPATIENT PHYSICAL THERAPY PROSTHETICS EVALUATION ? ? ?Patient Name: Lori Schmidt ?MRN: [id number] ?DOB:September 17, 1975, 46 y.o., female ?Today's Date: 08/17/2021 ? ?PCP: Nena Polio, MP ?REFERRING PROVIDER: Meridee Score, MD ? ? PT End of Session - 08/17/21 1340   ? ? Visit Number 1   ? Number of Visits 11   ? Date for PT Re-Evaluation 10/14/21   ? Authorization Type [employer]   ? Authorization Time Period 50% COINSURANCE AFTER DED HAS BEEN MET, DED $3600, OOP $18000, NO VISIT LIMIT/REF#[id number]   ? PT Start Time 1252   ? PT Stop Time 5053   ? PT Time Calculation (min) 45 min   ? Equipment Utilized During Treatment Gait belt   ? Activity Tolerance Patient tolerated treatment well   ? Behavior During Therapy [facility] for tasks assessed/performed   ? ?  ?  ? ?  ? ? ?Past Medical History:  ?Diagnosis Date  ? Hyperlipidemia   ? Hypertension   ? Obesity   ? Type 2 diabetes mellitus ([place])   ? ?Past Surgical History:  ?Procedure Laterality Date  ? AMPUTATION Left 02/19/2021  ? Procedure: AMPUTATION BELOW KNEE;  Surgeon: Newt Minion, MD;  Location: [place];  Service: Orthopedics;  Laterality: Left;  ? BREAST CYST EXCISION Right 05/2018  ? four cysts removed on right breast/axilla area  ? TEE WITHOUT CARDIOVERSION N/A 02/23/2021  ? Procedure: TRANSESOPHAGEAL ECHOCARDIOGRAM (TEE);  Surgeon: Pixie Casino, MD;  Location: [place];  Service: Cardiovascular;  Laterality: N/A;  ? ?Patient Active Problem List  ? Diagnosis Date Noted  ? Dyslipidemia   ? Hypoalbuminemia due to protein-calorie malnutrition ([place])   ? Diabetic peripheral neuropathy ([place])   ? Acute blood loss anemia   ? S/P BKA (below knee amputation) unilateral, left ([place]) 03/05/2021  ? Left below-knee amputee ([place]) 03/05/2021  ? Adjustment disorder with mixed anxiety and depressed mood   ? Pressure injury of skin 02/28/2021  ? Discitis of lumbar region   ? Subacute osteomyelitis, left ankle and foot ([place])   ? Diabetes mellitus ([place])   ? Left leg cellulitis   ?  Bacteremia due to Staphylococcus aureus 02/17/2021  ? Morbid obesity with BMI of 45.0-49.9, adult ([place]) 01/05/2012  ? ? ?ONSET DATE: 08/16/2021 prosthesis delivery ? ?REFERRING DIAG: Z76.734 (ICD-10-CM) - History of left below knee amputation ? ?THERAPY DIAG:  ?Other abnormalities of gait and mobility ? ?Unsteadiness on feet ? ?Muscle weakness (generalized) ? ?SUBJECTIVE:  ? ?SUBJECTIVE STATEMENT: ?She underwent a left Transtibial Amputation on 02/19/2021 due to cellulitis & osteomyelitis. She received her prosthesis on 08/16/2021.  ?Pt accompanied by: family member ? ?PERTINENT HISTORY: DM2, obesity, depression, HLD ? ?PAIN:  ?Are you having pain? No ? ?PRECAUTIONS: Fall ? ?WEIGHT BEARING RESTRICTIONS No ? ?FALLS: Has patient fallen in last 6 months? No ? ?LIVING ENVIRONMENT: ?Lives with: lives with their son and lives with their daughter  5 cats & 2 dogs (70# ea) ?Lives in: House/apartment ?Home Access: Stairs to enter and Ramped entrance 4 steps 2 rails  ?Home layout: One level ?Has following equipment at home: Gilford Rile - 2 wheeled, Wheelchair (manual), [employer], and Grab bars ? ?PLOF: Independent, Independent with household mobility without device, and Independent with community mobility without device ? 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 ?OBJECTIVE:  ? ?COGNITION: ?Overall cognitive status: Within functional limits for tasks assessed ? ?POSTURE: weight shift right ? ?LE ROM: ? ?Active ROM Right ?  08/17/2021 Left ?08/17/2021  ?Hip flexion [facility] WFL  ?Hip extension Standing -10* Standing  ?-10*  ?Hip abduction [facility] WFL  ?Hip adduction    ?Hip internal rotation    ?Hip external rotation    ?Knee flexion [facility] WFL  ?Knee extension [facility] WFL  ?Ankle dorsiflexion WFL   ?Ankle plantarflexion    ?Ankle inversion    ?Ankle eversion    ? (Blank rows = not tested) ?MMT: ? ?MMT Right ?08/17/2021 Left ?08/17/2021  ?Hip flexion [facility] WFL  ?Hip extension 4/5 4/5  ?Hip abduction 4/5 4/5  ?Hip  adduction    ?Hip internal rotation    ?Hip external rotation    ?Knee flexion 4/5 4/5  ?Knee extension 5/5 4/5  ?Ankle dorsiflexion    ?Ankle plantarflexion    ?Ankle inversion    ?Ankle eversion    ?(Blank rows = not tested) ? ?TRANSFERS: ?Sit to stand: SBA and requires UE assist ?Stand to sit: SBA ? ?GAIT: ?Gait pattern: step through pattern, decreased step length- Right, decreased stance time- Left, decreased hip/knee flexion- Left, circumduction- Left, antalgic, and abducted- Left ?Distance walked: 150' ?Assistive device utilized: Environmental consultant - 2 wheeled and TTA prosthesis ?Level of assistance: SBA ?Gait velocity: 2.57 ft/sec ?Comments: decreased stance time LLE, decreased step length RLE, flexed trunk, abducted LLE. ? ?FUNCTIONAL TESTs:  ?Berg Balance Scale: 29/56 ? [facility] PT Assessment - 08/17/21 1250   ? ?  ? Standardized Balance Assessment  ? Standardized Balance Assessment Berg Balance Test   ?  ? Berg Balance Test  ? Sit to Stand Able to stand  independently using hands   ? Standing Unsupported Able to stand 2 minutes with supervision   ? Sitting with Back Unsupported but Feet Supported on Floor or Stool Able to sit safely and securely 2 minutes   ? Stand to Sit Controls descent by using hands   ? Transfers Able to transfer safely, definite need of hands   ? Standing Unsupported with Eyes Closed Able to stand 10 seconds with supervision   ? Standing Unsupported with Feet Together Able to place feet together independently and stand for 1 minute with supervision   ? From Standing, Reach Forward with Outstretched Arm Reaches forward but needs supervision   ? From Standing Position, Pick up Object from Floor Able to pick up shoe, needs supervision   ? From Standing Position, Turn to Look Behind Over each Shoulder Needs supervision when turning   ? Turn 360 Degrees Needs assistance while turning   ? Standing Unsupported, Alternately Place Feet on Step/Stool Needs assistance to keep from falling or unable to try   ?  Standing Unsupported, One Foot in Front Needs help to step but can hold 15 seconds   ? Standing on One Leg Tries to lift leg/unable to hold 3 seconds but remains standing independently   ? Total Score 29   ? Berg comment: BERG  < 36 high risk for falls (close to 100%) 46-51 moderate (>50%)   37-45 significant (>80%) 52-55 lower (> 25%)   ? ?  ?  ? ?  ? ? ? ?CURRENT PROSTHETIC WEAR ASSESSMENT: ?Patient is dependent with: skin check, residual limb care, prosthetic cleaning, ply sock cleaning, correct ply sock adjustment, proper wear schedule/adjustment, and proper weight-bearing schedule/adjustment ?Donning prosthesis: SBA ?Doffing prosthesis: SBA ?Prosthetic wear tolerance: 1.5 hours/day for 1 day since delivery ?Prosthetic weight bearing tolerance: 5 minutes with partial weight on prosthesis with no c/o discomfort. ?Edema: pitting ?Residual limb condition: 5  scabs on incision with no signs of infection & no palpation of internal sutures working out, good hair growth,  normal color, temperature & moisture, cylindrical shape. ?Prosthetic description: silicon liner with pin lock, total contact socket, dynamic response foot with rotation ? ? ?TODAY'S TREATMENT:  ? ?TREATMENT:  ?PATIENT EDUCATED ON FOLLOWING PROSTHETIC CARE: ?Prosthetic wear tolerance: 2 hours 3x/day, 7 days/week ?Other education  Skin check, Residual limb care, Prosthetic cleaning, Correct ply sock adjustment, Propper donning, Proper wear schedule/adjustment, and Comments do not use antiperspirant until wounds heal ? ?Person educated: Patient and mother ?Education method: Explanation, Demonstration, Tactile cues, and Verbal cues ?Education comprehension: verbalized understanding, verbal cues required, tactile cues required, and needs further education ? ? ?ASSESSMENT: ? ?CLINICAL IMPRESSION: ?Patient is a 46 y.o. female who was seen today for physical therapy evaluation and treatment for prosthetic trainging with left Transtibial Amputation. She  underwent a right Transtibial Amputation on 02/19/2021 and received her first prosthesis on 08/16/2021.  She is dependent in proper prosthetic care which increases risk of skin issues & she has 5 scabs still present

## 2021-08-23 ENCOUNTER — Ambulatory Visit: Payer: Managed Care, Other (non HMO) | Admitting: Orthopedic Surgery

## 2021-08-23 ENCOUNTER — Encounter: Payer: Self-pay | Admitting: Orthopedic Surgery

## 2021-08-23 DIAGNOSIS — Z89512 Acquired absence of left leg below knee: Secondary | ICD-10-CM | POA: Diagnosis not present

## 2021-08-23 NOTE — Therapy (Signed)
?OUTPATIENT PHYSICAL THERAPY PROSTHETIC TREATMENT NOTE ? ? ?Patient Name: Lori Schmidt ?MRN: [id number] ?DOB:10-05-1975, 46 y.o., female ?Today's Date: 08/24/2021 ? ?PCP: Nena Polio, NP ?REFERRING PROVIDER: Meridee Score, MD ? ? PT End of Session - 08/24/21 1436   ? ? Visit Number 2   ? Number of Visits 11   ? Date for PT Re-Evaluation 10/14/21   ? Authorization Type Cigna   ? Authorization Time Period 50% COINSURANCE AFTER DED HAS BEEN MET, DED $3600, OOP $18000, NO VISIT LIMIT/REF#[id number]   ? PT Start Time 1430   ? PT Stop Time 0347   ? PT Time Calculation (min) 44 min   ? Equipment Utilized During Treatment Gait belt   ? Activity Tolerance Patient tolerated treatment well   ? Behavior During Therapy [facility] for tasks assessed/performed   ? ?  ?  ? ?  ? ? ?Past Medical History:  ?Diagnosis Date  ? Hyperlipidemia   ? Hypertension   ? Obesity   ? Type 2 diabetes mellitus ([place])   ? ?Past Surgical History:  ?Procedure Laterality Date  ? AMPUTATION Left 02/19/2021  ? Procedure: AMPUTATION BELOW KNEE;  Surgeon: Newt Minion, MD;  Location: [place];  Service: Orthopedics;  Laterality: Left;  ? BREAST CYST EXCISION Right 05/2018  ? four cysts removed on right breast/axilla area  ? TEE WITHOUT CARDIOVERSION N/A 02/23/2021  ? Procedure: TRANSESOPHAGEAL ECHOCARDIOGRAM (TEE);  Surgeon: Pixie Casino, MD;  Location: [place];  Service: Cardiovascular;  Laterality: N/A;  ? ?Patient Active Problem List  ? Diagnosis Date Noted  ? Dyslipidemia   ? Hypoalbuminemia due to protein-calorie malnutrition ([place])   ? Diabetic peripheral neuropathy ([place])   ? Acute blood loss anemia   ? S/P BKA (below knee amputation) unilateral, left ([place]) 03/05/2021  ? Left below-knee amputee ([place]) 03/05/2021  ? Adjustment disorder with mixed anxiety and depressed mood   ? Pressure injury of skin 02/28/2021  ? Discitis of lumbar region   ? Subacute osteomyelitis, left ankle and foot ([place])   ? Diabetes mellitus ([place])   ? Left leg cellulitis   ?  Bacteremia due to Staphylococcus aureus 02/17/2021  ? Morbid obesity with BMI of 45.0-49.9, adult ([place]) 01/05/2012  ? ? ?REFERRING DIAG: Q25.956 (ICD-10-CM) - History of left below knee amputation ? ?ONSET DATE: 08/16/2021 prosthesis delivery ? ?THERAPY DIAG:  ?Other abnormalities of gait and mobility ? ?Unsteadiness on feet ? ?Muscle weakness (generalized) ? ?PERTINENT HISTORY: DM2, obesity, depression, HLD ? ?PRECAUTIONS: Fall ? ?SUBJECTIVE: She is wearing prosthesis 2hrs on, 2 hrs off, for total 6-8 hours per day.  ? ?PAIN:  ?Are you having pain? No ? ?OBJECTIVE:  ?  ?POSTURE: 08/17/2021 weight shift right ?  ?LE ROM: ?  ?Active ROM Right ?08/17/2021 Left ?08/17/2021  ?Hip flexion [facility] WFL  ?Hip extension Standing -10* Standing  ?-10*  ?Hip abduction [facility] WFL  ?Hip adduction      ?Hip internal rotation      ?Hip external rotation      ?Knee flexion [facility] WFL  ?Knee extension [facility] WFL  ?Ankle dorsiflexion WFL    ?Ankle plantarflexion      ?Ankle inversion      ?Ankle eversion      ? (Blank rows = not tested) ?MMT: ?  ?MMT Right ?08/17/2021 Left ?08/17/2021  ?Hip flexion [facility] WFL  ?Hip extension 4/5 4/5  ?Hip abduction 4/5 4/5  ?Hip adduction      ?Hip internal rotation      ?  Hip external rotation      ?Knee flexion 4/5 4/5  ?Knee extension 5/5 4/5  ?Ankle dorsiflexion      ?Ankle plantarflexion      ?Ankle inversion      ?Ankle eversion      ?(Blank rows = not tested) ?  ?TRANSFERS: ?08/17/2021 Sit to stand: SBA and requires UE assist ?Stand to sit: SBA ?  ?GAIT: ?08/17/2021 Gait pattern: step through pattern, decreased step length- Right, decreased stance time- Left, decreased hip/knee flexion- Left, circumduction- Left, antalgic, and abducted- Left ?Distance walked: 150' ?Assistive device utilized: Environmental consultant - 2 wheeled and TTA prosthesis ?Level of assistance: SBA ?Gait velocity: 2.57 ft/sec ?Comments: decreased stance time LLE, decreased step length RLE, flexed trunk, abducted LLE. ?  ?FUNCTIONAL TESTs:  ?Berg Balance Scale: 08/17/2021  29/56 ?  [facility] PT Assessment - 08/17/21 1250   ?  ?    ?     ?  Standardized Balance Assessment  ?  Standardized Balance Assessment Berg Balance Test   ?     ?  Berg Balance Test  ?  Sit to Stand Able to stand  independently using hands   ?  Standing Unsupported Able to stand 2 minutes with supervision   ?  Sitting with Back Unsupported but Feet Supported on Floor or Stool Able to sit safely and securely 2 minutes   ?  Stand to Sit Controls descent by using hands   ?  Transfers Able to transfer safely, definite need of hands   ?  Standing Unsupported with Eyes Closed Able to stand 10 seconds with supervision   ?  Standing Unsupported with Feet Together Able to place feet together independently and stand for 1 minute with supervision   ?  From Standing, Reach Forward with Outstretched Arm Reaches forward but needs supervision   ?  From Standing Position, Pick up Object from Floor Able to pick up shoe, needs supervision   ?  From Standing Position, Turn to Look Behind Over each Shoulder Needs supervision when turning   ?  Turn 360 Degrees Needs assistance while turning   ?  Standing Unsupported, Alternately Place Feet on Step/Stool Needs assistance to keep from falling or unable to try   ?  Standing Unsupported, One Foot in Front Needs help to step but can hold 15 seconds   ?  Standing on One Leg Tries to lift leg/unable to hold 3 seconds but remains standing independently   ?  Total Score 29   ?  Berg comment: BERG  < 36 high risk for falls (close to 100%) 46-51 moderate (>50%)   37-45 significant (>80%) 52-55 lower (> 25%)   ?  ?   ?  ?  ?   ?  ?  ?  ?CURRENT PROSTHETIC WEAR ASSESSMENT: ?08/17/2021 Patient is dependent with: skin check, residual limb care, prosthetic cleaning, ply sock cleaning, correct ply sock adjustment, proper wear schedule/adjustment, and proper weight-bearing schedule/adjustment ?Donning prosthesis: SBA ?Doffing prosthesis: SBA ?Prosthetic wear tolerance: 1.5 hours/day for 1 day since  delivery ?Prosthetic weight bearing tolerance: 5 minutes with partial weight on prosthesis with no c/o discomfort. ?Edema: pitting ?Residual limb condition: 5 scabs on incision with no signs of infection & no palpation of internal sutures working out, good hair growth,  normal color, temperature & moisture, cylindrical shape. ?Prosthetic description: silicon liner with pin lock, total contact socket, dynamic response foot with rotation ?  ?  ?TODAY'S TREATMENT:  ?08/24/2021 ?Prosthetic Training with TTA  prosthesis: ?Pt neg ramp & curb with RW with cues on technique. ?Pt negotiated flight (11 steps) with 2 rails alternating pattern with PT cues with supervision. ?PT instructed in use of 24" & 29" stool for modified standing.  ? ?PROSTHETIC CARE / TRAINING: ?Wear time 2hrs on 2 hrs off for 6-8 hours total for last week. PT recommended increasing to 4hrs on (drying limb & liner half way) 2hrs off 3x/day.  ?Limb condition: wound on incision at distal tibia open appears ~1cm deep. No signs of infection.   ?Education details: Skin check, Residual limb care, Correct ply sock adjustment, Propper donning, and Proper wear schedule/adjustment, difference bw limb pain / phantom sensation & phantom pain and see pt instructions including increasing activity tolerance. ?Person educated: Patient ?Education method: Explanation, Demonstration, Tactile cues, Verbal cues, Handouts  ?Education comprehension: verbalized understanding, returned demonstration, verbal cues required, tactile cues required, and needs further education  ? ? ?TREATMENT:  ?08/17/2021  ?PATIENT EDUCATED ON FOLLOWING PROSTHETIC CARE: ?Prosthetic wear tolerance: 2 hours 3x/day, 7 days/week ?Other education  Skin check, Residual limb care, Prosthetic cleaning, Correct ply sock adjustment, Propper donning, Proper wear schedule/adjustment, and Comments do not use antiperspirant until wounds heal ?  ?Person educated: Patient and mother ?Education method: Explanation,  Demonstration, Tactile cues, and Verbal cues ?Education comprehension: verbalized understanding, verbal cues required, tactile cues required, and needs further education ?  ?  ?ASSESSMENT: ?  ?CLINICAL IMPRESSION: ?Pt a

## 2021-08-23 NOTE — Progress Notes (Signed)
? ?Office Visit Note ?  ?Patient: Lori Schmidt           ?Date of Birth: February 11, 1976           ?MRN: [id number] ?Visit Date: 08/23/2021 ?             ?Requested by: Primus Bravo, NP ?[address] ?[place],  [place] [address] ?PCP: Primus Bravo, NP ? ?Chief Complaint  ?Patient presents with  ? Left Leg - Follow-up  ?  02/19/21 left BKA ?  ?  ? ? ? ? ?HPI: ?Patient is a 46 year old woman who is 60-month status post left transtibial amputation.  She feels like she is doing well she has been wearing her prosthesis for 1 week.  Currently ambulating with a walker. ? ?Assessment & Plan: ?Visit Diagnoses:  ?1. History of left below knee amputation (HCC)   ? ? ?Plan: Plan to follow-up in 4 weeks with anticipated return to work with limited duty at that time.  Patient states she wants to start work around June 2. ? ?Follow-Up Instructions: Return in about 4 weeks (around 09/20/2021).  ? ?Ortho Exam ? ?Patient is alert, oriented, no adenopathy, well-dressed, normal affect, normal respiratory effort. ?Examination the residual limb on the left has ? ?Imaging: ?No results found. ?No images are attached to the encounter. ? ?Labs: ?Lab Results  ?Component Value Date  ? ESRSEDRATE 15 03/22/2021  ? ESRSEDRATE 25 (H) 03/15/2021  ? ESRSEDRATE 67 (H) 02/26/2021  ? CRP 0.7 03/22/2021  ? CRP 0.9 03/15/2021  ? CRP 7.5 (H) 02/26/2021  ? REPTSTATUS 02/24/2021 FINAL 02/19/2021  ? CULT  02/19/2021  ?  NO GROWTH 5 DAYS ?Performed at [facility], [address] [place], [place] [address] ?  ? LABORGA STAPHYLOCOCCUS AUREUS 02/16/2021  ? ? ? ?Lab Results  ?Component Value Date  ? ALBUMIN 2.7 (L) 03/22/2021  ? ALBUMIN 2.8 (L) 03/15/2021  ? ALBUMIN 2.3 (L) 03/08/2021  ? ? ?Lab Results  ?Component Value Date  ? MG 2.1 02/20/2021  ? MG 2.0 02/18/2021  ? ?No results found for: VD25OH ? ?No results found for: PREALBUMIN ? ?  Latest Ref Rng & Units 03/22/2021  ?  3:09 AM 03/15/2021  ?  3:36 AM 03/08/2021  ?  3:07 AM  ?CBC EXTENDED  ?WBC  4.0 - 10.5 K/uL 5.9   5.7   6.3    ?RBC 3.87 - 5.11 MIL/uL 3.17   3.27   3.31    ?Hemoglobin 12.0 - 15.0 g/dL 8.9   9.1   9.5    ?HCT 36.0 - 46.0 % 27.9   28.4   29.0    ?Platelets 150 - 400 K/uL 231   258   347    ?NEUT# 1.7 - 7.7 K/uL 3.2   2.8   2.8    ?Lymph# 0.7 - 4.0 K/uL 1.3   1.3   1.7    ? ? ? ?There is no height or weight on file to calculate BMI. ? ?Orders:  ?No orders of the defined types were placed in this encounter. ? ?No orders of the defined types were placed in this encounter. ? ? ? Procedures: ?No procedures performed ? ?Clinical Data: ?No additional findings. ? ?ROS: ? ?All other systems negative, except as noted in the HPI. ?Review of Systems ? ?Objective: ?Vital Signs: There were no vitals taken for this visit. ? ?Specialty Comments:  ?No specialty comments available. ? ?PMFS History: ?Patient Active Problem List  ?  Diagnosis Date Noted  ? Dyslipidemia   ? Hypoalbuminemia due to protein-calorie malnutrition (HCC)   ? Diabetic peripheral neuropathy (HCC)   ? Acute blood loss anemia   ? S/P BKA (below knee amputation) unilateral, left (HCC) 03/05/2021  ? Left below-knee amputee (HCC) 03/05/2021  ? Adjustment disorder with mixed anxiety and depressed mood   ? Pressure injury of skin 02/28/2021  ? Discitis of lumbar region   ? Subacute osteomyelitis, left ankle and foot (HCC)   ? Diabetes mellitus (HCC)   ? Left leg cellulitis   ? Bacteremia due to Staphylococcus aureus 02/17/2021  ? Morbid obesity with BMI of 45.0-49.9, adult (HCC) 01/05/2012  ? ?Past Medical History:  ?Diagnosis Date  ? Hyperlipidemia   ? Hypertension   ? Obesity   ? Type 2 diabetes mellitus (HCC)   ?  ?Family History  ?Problem Relation Age of Onset  ? Hypothyroidism Mother   ? Diabetes Mother   ? Diabetes Maternal Grandmother   ? Breast cancer Paternal Grandmother   ?  ?Past Surgical History:  ?Procedure Laterality Date  ? AMPUTATION Left 02/19/2021  ? Procedure: AMPUTATION BELOW KNEE;  Surgeon: Nadara Mustard, MD;  Location: [facility]  OR;  Service: Orthopedics;  Laterality: Left;  ? BREAST CYST EXCISION Right 05/2018  ? four cysts removed on right breast/axilla area  ? TEE WITHOUT CARDIOVERSION N/A 02/23/2021  ? Procedure: TRANSESOPHAGEAL ECHOCARDIOGRAM (TEE);  Surgeon: Chrystie Nose, MD;  Location: [facility] ENDOSCOPY;  Service: Cardiovascular;  Laterality: N/A;  ? ?Social History  ? ?Occupational History  ? 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  Employer: [employer]  ?Tobacco Use  ? Smoking status: Former  ?  Types: Cigarettes  ?  Quit date: 12/11/2010  ?  Years since quitting: 10.7  ? Smokeless tobacco: Never  ?Vaping Use  ? Vaping Use: Not on file  ?Substance and Sexual Activity  ? Alcohol use: No  ? Drug use: No  ? Sexual activity: Not Currently  ? ? ? ? ? ?

## 2021-08-24 ENCOUNTER — Encounter: Payer: Managed Care, Other (non HMO) | Admitting: Physical Therapy

## 2021-08-24 ENCOUNTER — Encounter: Payer: Self-pay | Admitting: Physical Therapy

## 2021-08-24 ENCOUNTER — Ambulatory Visit (INDEPENDENT_AMBULATORY_CARE_PROVIDER_SITE_OTHER): Payer: Managed Care, Other (non HMO) | Admitting: Physical Therapy

## 2021-08-24 DIAGNOSIS — M6281 Muscle weakness (generalized): Secondary | ICD-10-CM | POA: Diagnosis not present

## 2021-08-24 DIAGNOSIS — R2689 Other abnormalities of gait and mobility: Secondary | ICD-10-CM | POA: Diagnosis not present

## 2021-08-24 DIAGNOSIS — R2681 Unsteadiness on feet: Secondary | ICD-10-CM | POA: Diagnosis not present

## 2021-08-24 NOTE — Patient Instructions (Signed)
Hanger Socks: 1-ply is yellow color at top, 3-ply is green at top, 5-ply is navy blue at top ?How many ply you need depends on your limb size.  You should have even pressure on your limb when standing & walking.  ?Guidance points: 1. How ease it goes on? Should be some resistance. Too few it goes on too easily. Too many it takes a lot of work to get it on. ?2. How many clicks you get. Especially clicks in sitting. ?3. After standing or walking, check knee cap. Bottom should be just under the front lip.  Too few bottom of knee cap sits on indention. Too many bottom is above front lip. ?4. Have your feet beside each other & hips over feet. Place hands on your waist. Pelvis Should be level. Too few prosthetic side will be low. Too many prosthetic side will be high.  ?  ?Get ply socks correct before you leave the house. Take extra socks with you. Take one 3-ply and two 1-ply with you. This is in addition to what you are wearing.   ? ?Increasing your activity level is important. ? ?Short distances which is walking from one room to another. Work to increase frequency back to prior level. ? ?Medium distances are entering & exiting your home or community with limited distances. Start with 4 medium walks which is one outing to one location and increase number of tolerated amounts per day. ? ?Long distance is your highest tolerance for you. Walk until you feel you must rest. Back or leg pain or general fatigue are indicators to maximum tolerance. Monitor by distance or time. Try to walk your BEST distance 1-2 times per day. You should see this increase over time.  ? ?Dialysis days compared to non-dialysis days will have lower frequency of short walks and shorter distance of long walk or your max tolerable distance.  ? ?

## 2021-08-25 ENCOUNTER — Encounter: Payer: Self-pay | Admitting: Physical Therapy

## 2021-08-25 ENCOUNTER — Ambulatory Visit (INDEPENDENT_AMBULATORY_CARE_PROVIDER_SITE_OTHER): Payer: Managed Care, Other (non HMO) | Admitting: Physical Therapy

## 2021-08-25 DIAGNOSIS — R2689 Other abnormalities of gait and mobility: Secondary | ICD-10-CM | POA: Diagnosis not present

## 2021-08-25 DIAGNOSIS — M6281 Muscle weakness (generalized): Secondary | ICD-10-CM | POA: Diagnosis not present

## 2021-08-25 DIAGNOSIS — R2681 Unsteadiness on feet: Secondary | ICD-10-CM | POA: Diagnosis not present

## 2021-08-25 NOTE — Therapy (Signed)
?OUTPATIENT PHYSICAL THERAPY PROSTHETIC TREATMENT NOTE ? ? ?Patient Name: Lori Schmidt ?MRN: [id number] ?DOB:May 25, 1975, 46 y.o., female ?Today's Date: 08/25/2021 ? ?PCP: Nena Polio, NP ?REFERRING PROVIDER: Meridee Score, MD ? ? PT End of Session - 08/25/21 1555   ? ? Visit Number 3   ? Number of Visits 11   ? Date for PT Re-Evaluation 10/14/21   ? Authorization Type [employer]   ? Authorization Time Period 50% COINSURANCE AFTER DED HAS BEEN MET, DED $3600, OOP $18000, NO VISIT LIMIT/REF#[id number]   ? PT Start Time 1607   ? PT Stop Time 1515   ? PT Time Calculation (min) 44 min   ? Equipment Utilized During Treatment Gait belt   ? Activity Tolerance Patient tolerated treatment well   ? Behavior During Therapy [facility] for tasks assessed/performed   ? ?  ?  ? ?  ? ? ?Past Medical History:  ?Diagnosis Date  ? Hyperlipidemia   ? Hypertension   ? Obesity   ? Type 2 diabetes mellitus ([place])   ? ?Past Surgical History:  ?Procedure Laterality Date  ? AMPUTATION Left 02/19/2021  ? Procedure: AMPUTATION BELOW KNEE;  Surgeon: Newt Minion, MD;  Location: [place];  Service: Orthopedics;  Laterality: Left;  ? BREAST CYST EXCISION Right 05/2018  ? four cysts removed on right breast/axilla area  ? TEE WITHOUT CARDIOVERSION N/A 02/23/2021  ? Procedure: TRANSESOPHAGEAL ECHOCARDIOGRAM (TEE);  Surgeon: Pixie Casino, MD;  Location: [place];  Service: Cardiovascular;  Laterality: N/A;  ? ?Patient Active Problem List  ? Diagnosis Date Noted  ? Dyslipidemia   ? Hypoalbuminemia due to protein-calorie malnutrition ([place])   ? Diabetic peripheral neuropathy ([place])   ? Acute blood loss anemia   ? S/P BKA (below knee amputation) unilateral, left ([place]) 03/05/2021  ? Left below-knee amputee ([place]) 03/05/2021  ? Adjustment disorder with mixed anxiety and depressed mood   ? Pressure injury of skin 02/28/2021  ? Discitis of lumbar region   ? Subacute osteomyelitis, left ankle and foot ([place])   ? Diabetes mellitus ([place])   ? Left leg cellulitis   ?  Bacteremia due to Staphylococcus aureus 02/17/2021  ? Morbid obesity with BMI of 45.0-49.9, adult ([place]) 01/05/2012  ? ? ?REFERRING DIAG: P71.062 (ICD-10-CM) - History of left below knee amputation ? ?ONSET DATE: 08/16/2021 prosthesis delivery ? ?THERAPY DIAG:  ?Other abnormalities of gait and mobility ? ?Unsteadiness on feet ? ?Muscle weakness (generalized) ? ?PERTINENT HISTORY: DM2, obesity, depression, HLD ? ?PRECAUTIONS: Fall ? ?SUBJECTIVE: She is wearing prosthesis most of the day now, she gets frustrated with knowing when to adjust ply socks ? ?PAIN:  ?Are you having pain? No ? ?OBJECTIVE:  ?  ?POSTURE: 08/17/2021 weight shift right ?  ?LE ROM: ?  ?Active ROM Right ?08/17/2021 Left ?08/17/2021  ?Hip flexion [facility] WFL  ?Hip extension Standing -10* Standing  ?-10*  ?Hip abduction [facility] WFL  ?Hip adduction      ?Hip internal rotation      ?Hip external rotation      ?Knee flexion [facility] WFL  ?Knee extension [facility] WFL  ?Ankle dorsiflexion WFL    ?Ankle plantarflexion      ?Ankle inversion      ?Ankle eversion      ? (Blank rows = not tested) ?MMT: ?  ?MMT Right ?08/17/2021 Left ?08/17/2021  ?Hip flexion [facility] WFL  ?Hip extension 4/5 4/5  ?Hip abduction 4/5 4/5  ?Hip adduction      ?Hip internal rotation      ?  Hip external rotation      ?Knee flexion 4/5 4/5  ?Knee extension 5/5 4/5  ?Ankle dorsiflexion      ?Ankle plantarflexion      ?Ankle inversion      ?Ankle eversion      ?(Blank rows = not tested) ?  ?TRANSFERS: ?08/17/2021 Sit to stand: SBA and requires UE assist ?Stand to sit: SBA ?  ?GAIT: ?08/17/2021 Gait pattern: step through pattern, decreased step length- Right, decreased stance time- Left, decreased hip/knee flexion- Left, circumduction- Left, antalgic, and abducted- Left ?Distance walked: 150' ?Assistive device utilized: Environmental consultant - 2 wheeled and TTA prosthesis ?Level of assistance: SBA ?Gait velocity: 2.57 ft/sec ?Comments: decreased stance time LLE, decreased step length RLE, flexed trunk, abducted LLE. ?  ?FUNCTIONAL TESTs:   ?Berg Balance Scale: 08/17/2021 29/56 ?  [facility] PT Assessment - 08/17/21 1250   ?  ?    ?     ?  Standardized Balance Assessment  ?  Standardized Balance Assessment Berg Balance Test   ?     ?  Berg Balance Test  ?  Sit to Stand Able to stand  independently using hands   ?  Standing Unsupported Able to stand 2 minutes with supervision   ?  Sitting with Back Unsupported but Feet Supported on Floor or Stool Able to sit safely and securely 2 minutes   ?  Stand to Sit Controls descent by using hands   ?  Transfers Able to transfer safely, definite need of hands   ?  Standing Unsupported with Eyes Closed Able to stand 10 seconds with supervision   ?  Standing Unsupported with Feet Together Able to place feet together independently and stand for 1 minute with supervision   ?  From Standing, Reach Forward with Outstretched Arm Reaches forward but needs supervision   ?  From Standing Position, Pick up Object from Floor Able to pick up shoe, needs supervision   ?  From Standing Position, Turn to Look Behind Over each Shoulder Needs supervision when turning   ?  Turn 360 Degrees Needs assistance while turning   ?  Standing Unsupported, Alternately Place Feet on Step/Stool Needs assistance to keep from falling or unable to try   ?  Standing Unsupported, One Foot in Front Needs help to step but can hold 15 seconds   ?  Standing on One Leg Tries to lift leg/unable to hold 3 seconds but remains standing independently   ?  Total Score 29   ?  Berg comment: BERG  < 36 high risk for falls (close to 100%) 46-51 moderate (>50%)   37-45 significant (>80%) 52-55 lower (> 25%)   ?  ?   ?  ?  ?   ?  ?  ?  ?CURRENT PROSTHETIC WEAR ASSESSMENT: ?08/17/2021 Patient is dependent with: skin check, residual limb care, prosthetic cleaning, ply sock cleaning, correct ply sock adjustment, proper wear schedule/adjustment, and proper weight-bearing schedule/adjustment ?Donning prosthesis: SBA ?Doffing prosthesis: SBA ?Prosthetic wear tolerance: 1.5  hours/day for 1 day since delivery ?Prosthetic weight bearing tolerance: 5 minutes with partial weight on prosthesis with no c/o discomfort. ?Edema: pitting ?Residual limb condition: 5 scabs on incision with no signs of infection & no palpation of internal sutures working out, good hair growth,  normal color, temperature & moisture, cylindrical shape. ?Prosthetic description: silicon liner with pin lock, total contact socket, dynamic response foot with rotation ?  ?  ?TODAY'S TREATMENT:  ?08/25/21 ?Gait without AD and  with prosthesis with overall supervision 300 feet ?Ramp negotiation with prosthesis no AD with min A X 3 times, provided cuing and demo for technique ?Step ups 6 inch up with Rt and down in front with left in bars with bilat UE suppport X 10 ?Sidestepping in bars 3 round trips without UE support ?Tandem balance in bars 30 sec  X2 bilat ?Box lift X 5 18# floor to chair ?Push/Pull 18# box using roller chair to simulate pallet jack she would have to use at work ?Cable pull backwards walkouts 20# X 5 ?Leg press 100# DL X 20, then Lt leg only 50# X 20 ? ? ?08/24/2021 ?Prosthetic Training with TTA prosthesis: ?Pt neg ramp & curb with RW with cues on technique. ?Pt negotiated flight (11 steps) with 2 rails alternating pattern with PT cues with supervision. ?PT instructed in use of 24" & 29" stool for modified standing.  ? ?PROSTHETIC CARE / TRAINING: ?Wear time 2hrs on 2 hrs off for 6-8 hours total for last week. PT recommended increasing to 4hrs on (drying limb & liner half way) 2hrs off 3x/day.  ?Limb condition: wound on incision at distal tibia open appears ~1cm deep. No signs of infection.   ?Education details: Skin check, Residual limb care, Correct ply sock adjustment, Propper donning, and Proper wear schedule/adjustment, difference bw limb pain / phantom sensation & phantom pain and see pt instructions including increasing activity tolerance. ?Person educated: Patient ?Education method: Explanation,  Demonstration, Tactile cues, Verbal cues, Handouts  ?Education comprehension: verbalized understanding, returned demonstration, verbal cues required, tactile cues required, and needs further education  ? ? ?TREATMENT

## 2021-08-30 ENCOUNTER — Ambulatory Visit (INDEPENDENT_AMBULATORY_CARE_PROVIDER_SITE_OTHER): Payer: Managed Care, Other (non HMO) | Admitting: Physical Therapy

## 2021-08-30 ENCOUNTER — Encounter: Payer: Self-pay | Admitting: Physical Therapy

## 2021-08-30 DIAGNOSIS — R2689 Other abnormalities of gait and mobility: Secondary | ICD-10-CM | POA: Diagnosis not present

## 2021-08-30 DIAGNOSIS — M6281 Muscle weakness (generalized): Secondary | ICD-10-CM | POA: Diagnosis not present

## 2021-08-30 DIAGNOSIS — R2681 Unsteadiness on feet: Secondary | ICD-10-CM | POA: Diagnosis not present

## 2021-08-30 NOTE — Therapy (Signed)
OUTPATIENT PHYSICAL THERAPY PROSTHETIC TREATMENT NOTE   Patient Name: Lori Schmidt MRN: [id number] DOB:09-21-1975, 46 y.o., female Today's Date: 08/30/2021  PCP: Nena Polio, NP REFERRING PROVIDER: Meridee Score, MD   PT End of Session - 08/30/21 1346     Visit Number 4    Number of Visits 11    Date for PT Re-Evaluation 10/14/21    Authorization Type [employer]    Authorization Time Period 50% COINSURANCE AFTER DED HAS BEEN MET, DED $3600, OOP $18000, NO VISIT LIMIT/REF#[id number]    PT Start Time 1345    PT Stop Time 1433    PT Time Calculation (min) 48 min    Equipment Utilized During Treatment Gait belt    Activity Tolerance Patient tolerated treatment well    Behavior During Therapy WFL for tasks assessed/performed              Past Medical History:  Diagnosis Date   Hyperlipidemia    Hypertension    Obesity    Type 2 diabetes mellitus ([place])    Past Surgical History:  Procedure Laterality Date   AMPUTATION Left 02/19/2021   Procedure: AMPUTATION BELOW KNEE;  Surgeon: Newt Minion, MD;  Location: [place];  Service: Orthopedics;  Laterality: Left;   BREAST CYST EXCISION Right 05/2018   four cysts removed on right breast/axilla area   TEE WITHOUT CARDIOVERSION N/A 02/23/2021   Procedure: TRANSESOPHAGEAL ECHOCARDIOGRAM (TEE);  Surgeon: Pixie Casino, MD;  Location: [facility] ENDOSCOPY;  Service: Cardiovascular;  Laterality: N/A;   Patient Active Problem List   Diagnosis Date Noted   Dyslipidemia    Hypoalbuminemia due to protein-calorie malnutrition ([place])    Diabetic peripheral neuropathy ([place])    Acute blood loss anemia    S/P BKA (below knee amputation) unilateral, left ([place]) 03/05/2021   Left below-knee amputee ([place]) 03/05/2021   Adjustment disorder with mixed anxiety and depressed mood    Pressure injury of skin 02/28/2021   Discitis of lumbar region    Subacute osteomyelitis, left ankle and foot ([place])    Diabetes mellitus ([place])    Left leg cellulitis     Bacteremia due to Staphylococcus aureus 02/17/2021   Morbid obesity with BMI of 45.0-49.9, adult ([place]) 01/05/2012    REFERRING DIAG: D78.242 (ICD-10-CM) - History of left below knee amputation  ONSET DATE: 08/16/2021 prosthesis delivery  THERAPY DIAG:  Other abnormalities of gait and mobility  Unsteadiness on feet  Muscle weakness (generalized)  PERTINENT HISTORY: DM2, obesity, depression, HLD  PRECAUTIONS: Fall  SUBJECTIVE: She is wearing prosthesis most of awake hours.  She has been walking without device   PAIN:  Are you having pain?  No  OBJECTIVE:    POSTURE: 08/17/2021 weight shift right   LE ROM:   Active ROM Right 08/17/2021 Left 08/17/2021  Hip flexion [facility]  Hip extension Standing -10* Standing  -10*  Hip abduction [facility]  Hip adduction      Hip internal rotation      Hip external rotation      Knee flexion [facility]  Knee extension [facility]  Ankle dorsiflexion [facility]    Ankle plantarflexion      Ankle inversion      Ankle eversion       (Blank rows = not tested) MMT:   MMT Right 08/17/2021 Left 08/17/2021  Hip flexion [facility]  Hip extension 4/5 4/5  Hip abduction 4/5 4/5  Hip adduction      Hip internal rotation  Hip external rotation      Knee flexion 4/5 4/5  Knee extension 5/5 4/5  Ankle dorsiflexion      Ankle plantarflexion      Ankle inversion      Ankle eversion      (Blank rows = not tested)   TRANSFERS: 08/17/2021 Sit to stand: SBA and requires UE assist Stand to sit: SBA   GAIT: 08/17/2021 Gait pattern: step through pattern, decreased step length- Right, decreased stance time- Left, decreased hip/knee flexion- Left, circumduction- Left, antalgic, and abducted- Left Distance walked: 150' Assistive device utilized: Environmental consultant - 2 wheeled and TTA prosthesis Level of assistance: SBA Gait velocity: 2.57 ft/sec Comments: decreased stance time LLE, decreased step length RLE, flexed trunk, abducted LLE.   FUNCTIONAL TESTs:  Merrilee Jansky Balance Scale:  08/17/2021 29/56   [facility] PT Assessment - 08/17/21 1250                Standardized Balance Assessment    Standardized Balance Assessment Berg Balance Test          Berg Balance Test    Sit to Stand Able to stand  independently using hands     Standing Unsupported Able to stand 2 minutes with supervision     Sitting with Back Unsupported but Feet Supported on Floor or Stool Able to sit safely and securely 2 minutes     Stand to Sit Controls descent by using hands     Transfers Able to transfer safely, definite need of hands     Standing Unsupported with Eyes Closed Able to stand 10 seconds with supervision     Standing Unsupported with Feet Together Able to place feet together independently and stand for 1 minute with supervision     From Standing, Reach Forward with Outstretched Arm Reaches forward but needs supervision     From Standing Position, Pick up Object from [place] to pick up shoe, needs supervision     From Standing Position, Turn to Look Behind Over each Shoulder Needs supervision when turning     Turn 360 Degrees Needs assistance while turning     Standing Unsupported, Alternately Place Feet on Step/Stool Needs assistance to keep from falling or unable to try     Standing Unsupported, One Foot in Front Needs help to step but can hold 15 seconds     Standing on One Leg Tries to lift leg/unable to hold 3 seconds but remains standing independently     Total Score 29     Berg comment: BERG  < 36 high risk for falls (close to 100%) 46-51 moderate (>50%)   37-45 significant (>80%) 52-55 lower (> 25%)                     CURRENT PROSTHETIC WEAR ASSESSMENT: 08/17/2021 Patient is dependent with: skin check, residual limb care, prosthetic cleaning, ply sock cleaning, correct ply sock adjustment, proper wear schedule/adjustment, and proper weight-bearing schedule/adjustment Donning prosthesis: SBA Doffing prosthesis: SBA Prosthetic wear tolerance: 1.5 hours/day for 1 day since  delivery Prosthetic weight bearing tolerance: 5 minutes with partial weight on prosthesis with no c/o discomfort. Edema: pitting Residual limb condition: 5 scabs on incision with no signs of infection & no palpation of internal sutures working out, good hair growth,  normal color, temperature & moisture, cylindrical shape. Prosthetic description: silicon liner with pin lock, total contact socket, dynamic response foot with rotation     TODAY'S TREATMENT:  08/30/2021 Prosthetic Training with TTA  prosthesis: Pt arrived ambulating without device but significant antalgic pattern. PT educated on gradual increase in weight bearing. PT recommending short distances like within home without device, community with cane. Pt ambulated with cane with only minimal antalgic pattern.  Pt verbalized understanding.  PT measured calf of residual limb for under liner for size IV. PT instructed in use of under liner. Pt verbalized understanding.   Wound is same size but white perimeter indicating too moist.  Pt demo & verbal cues on picking up items from floor. Pt return demo understanding. PT demo & verbal cues on lifting 18# box. Pt able to perform 3 reps with verbal cues. Progressed to turning 90* & placing box on waist high table. Pt performed 1 rep ea to right & left from floor to table to floor. Ramp training: treadmill uphill & downhill 1.73mph up to 10% incline. PT cueing on weight shift & step length on prosthesis. Carryover to 12* incline in PT clinic with touch on window seal with tactile & verbal cues on technique.  PT & pt watched YouTube video from [place] on technique with PT note issues.   08/25/21 Gait without AD and with prosthesis with overall supervision 300 feet Ramp negotiation with prosthesis no AD with min A X 3 times, provided cuing and demo for technique Step ups 6 inch up with Rt and down in front with left in bars with bilat UE suppport X 10 Sidestepping in bars 3 round trips without UE  support Tandem balance in bars 30 sec  X2 bilat Box lift X 5 18# floor to chair Push/Pull 18# box using roller chair to simulate pallet jack she would have to use at work [employer] pull backwards walkouts 20# X 5 Leg press 100# DL X 20, then Lt leg only 50# X 20   08/24/2021 Prosthetic Training with TTA prosthesis: Pt neg ramp & curb with RW with cues on technique. Pt negotiated flight (11 steps) with 2 rails alternating pattern with PT cues with supervision. PT instructed in use of 24" & 29" stool for modified standing.   PROSTHETIC CARE / TRAINING: Wear time 2hrs on 2 hrs off for 6-8 hours total for last week. PT recommended increasing to 4hrs on (drying limb & liner half way) 2hrs off 3x/day.  Limb condition: wound on incision at distal tibia open appears ~1cm deep. No signs of infection.   Education details: Skin check, Residual limb care, Correct ply sock adjustment, Propper donning, and Proper wear schedule/adjustment, difference bw limb pain / phantom sensation & phantom pain and see pt instructions including increasing activity tolerance. Person educated: Patient Education method: Explanation, Demonstration, Tactile cues, Verbal cues, Handouts  Education comprehension: verbalized understanding, returned demonstration, verbal cues required, tactile cues required, and needs further education       ASSESSMENT:   CLINICAL IMPRESSION: Pt improved her ability to lift & move 18# box.  She seems to understand need to progress weight bearing & PT recommendation for cane in community for now.  Pt improved ramp negotiation but needs further instructions.    OBJECTIVE IMPAIRMENTS Abnormal gait, decreased activity tolerance, decreased balance, decreased endurance, decreased knowledge of use of DME, decreased mobility, increased edema, and prosthetic dependency .    ACTIVITY LIMITATIONS community activity and occupation.    PERSONAL FACTORS Fitness, Time since onset of  injury/illness/exacerbation, and 3+ comorbidities: see PMH  are also affecting patient's functional outcome.    REHAB POTENTIAL: Good   CLINICAL DECISION MAKING: Stable/uncomplicated   EVALUATION  COMPLEXITY: Low     GOALS: Goals reviewed with patient? Yes   SHORT TERM GOALS: Target date: 09/17/2021   Patient donnes prosthesis modified independent & verbalizes proper cleaning. Baseline: SEE OBJECTIVE DATA Goal status: INITIAL 2.  Patient tolerates prosthesis >14 hrs total /day without skin issues or limb pain. Baseline: SEE OBJECTIVE DATA Goal status: INITIAL   3.  Patient able pick up items from floor and reach 10" without UE support safely. Baseline: SEE OBJECTIVE DATA Goal status: INITIAL   4. Patient ambulates 200' with cane & prosthesis with supervision. Baseline: SEE OBJECTIVE DATA Goal status: INITIAL   5. Patient negotiates ramps & curbs with cane & prosthesis with supervision. Baseline: SEE OBJECTIVE DATA Goal status: INITIAL     LONG TERM GOALS: Target date: 10/14/2021   Patient demonstrates & verbalized understanding of prosthetic care to enable safe utilization of prosthesis. Baseline: SEE OBJECTIVE DATA Goal status: INITIAL   Patient tolerates prosthesis wear >90% of awake hours without skin or limb pain issues. Baseline: SEE OBJECTIVE DATA Goal status: INITIAL   Functional Gait Assessment <22/30 to indicate lower fall risk Baseline: SEE OBJECTIVE DATA Goal status: INITIAL   Patient ambulates >500' with prosthesis only independently Baseline: SEE OBJECTIVE DATA Goal status: INITIAL   Patient negotiates ramps, curbs & stairs with single rail with prosthesis only independently. Baseline: SEE OBJECTIVE DATA Goal status: INITIAL   Patient demonstrates & verbalizes lifting, carrying, pushing, pulling with prosthesis only safely. Baseline: SEE OBJECTIVE DATA Goal status: INITIAL   Berg Balance >/= 52/56 to indicate low fall risk. Baseline: SEE OBJECTIVE  DATA Goal status: INITIAL   PLAN: PT FREQUENCY: 1-2x/week   PT DURATION: 8 weeks   PLANNED INTERVENTIONS: Therapeutic exercises, Therapeutic activity, Neuromuscular re-education, Balance training, Gait training, Patient/Family education, Stair training, Vestibular training, Prosthetic training, Taping, and Manual therapy   PLAN FOR NEXT SESSION: add HEP for balance,  progress prosthetic gait with dynamic balance and work simulations  Jamey Reas, PT, DPT 08/30/2021, 2:45 PM

## 2021-09-01 ENCOUNTER — Ambulatory Visit (INDEPENDENT_AMBULATORY_CARE_PROVIDER_SITE_OTHER): Payer: Managed Care, Other (non HMO) | Admitting: Physical Therapy

## 2021-09-01 ENCOUNTER — Encounter: Payer: Self-pay | Admitting: Physical Therapy

## 2021-09-01 DIAGNOSIS — R2689 Other abnormalities of gait and mobility: Secondary | ICD-10-CM

## 2021-09-01 DIAGNOSIS — R2681 Unsteadiness on feet: Secondary | ICD-10-CM

## 2021-09-01 DIAGNOSIS — M6281 Muscle weakness (generalized): Secondary | ICD-10-CM | POA: Diagnosis not present

## 2021-09-01 NOTE — Therapy (Signed)
OUTPATIENT PHYSICAL THERAPY PROSTHETIC TREATMENT NOTE   Patient Name: Lori Schmidt MRN: [id number] DOB:Sep 01, 1975, 46 y.o., female Today's Date: 09/01/2021  PCP: Nena Polio, NP REFERRING PROVIDER: Meridee Score, MD   PT End of Session - 09/01/21 1348     Visit Number 5    Number of Visits 11    Date for PT Re-Evaluation 10/14/21    Authorization Type [employer]    Authorization Time Period 50% COINSURANCE AFTER DED HAS BEEN MET, DED $3600, OOP $18000, NO VISIT LIMIT/REF#[id number]    PT Start Time 1345    PT Stop Time 1430    PT Time Calculation (min) 45 min    Equipment Utilized During Treatment Gait belt    Activity Tolerance Patient tolerated treatment well    Behavior During Therapy WFL for tasks assessed/performed               Past Medical History:  Diagnosis Date   Hyperlipidemia    Hypertension    Obesity    Type 2 diabetes mellitus ([place])    Past Surgical History:  Procedure Laterality Date   AMPUTATION Left 02/19/2021   Procedure: AMPUTATION BELOW KNEE;  Surgeon: Newt Minion, MD;  Location: [place];  Service: Orthopedics;  Laterality: Left;   BREAST CYST EXCISION Right 05/2018   four cysts removed on right breast/axilla area   TEE WITHOUT CARDIOVERSION N/A 02/23/2021   Procedure: TRANSESOPHAGEAL ECHOCARDIOGRAM (TEE);  Surgeon: Pixie Casino, MD;  Location: [facility] ENDOSCOPY;  Service: Cardiovascular;  Laterality: N/A;   Patient Active Problem List   Diagnosis Date Noted   Dyslipidemia    Hypoalbuminemia due to protein-calorie malnutrition ([place])    Diabetic peripheral neuropathy ([place])    Acute blood loss anemia    S/P BKA (below knee amputation) unilateral, left ([place]) 03/05/2021   Left below-knee amputee ([place]) 03/05/2021   Adjustment disorder with mixed anxiety and depressed mood    Pressure injury of skin 02/28/2021   Discitis of lumbar region    Subacute osteomyelitis, left ankle and foot ([place])    Diabetes mellitus ([place])    Left leg cellulitis     Bacteremia due to Staphylococcus aureus 02/17/2021   Morbid obesity with BMI of 45.0-49.9, adult ([place]) 01/05/2012    REFERRING DIAG: F35.456 (ICD-10-CM) - History of left below knee amputation  ONSET DATE: 08/16/2021 prosthesis delivery  THERAPY DIAG:  Other abnormalities of gait and mobility  Unsteadiness on feet  Muscle weakness (generalized)  PERTINENT HISTORY: DM2, obesity, depression, HLD  PRECAUTIONS: Fall  SUBJECTIVE: She is walking more each day.    PAIN:  Are you having pain?  No  OBJECTIVE:    POSTURE: 08/17/2021 weight shift right   LE ROM:   Active ROM Right 08/17/2021 Left 08/17/2021  Hip flexion [facility]  Hip extension Standing -10* Standing  -10*  Hip abduction [facility]  Hip adduction      Hip internal rotation      Hip external rotation      Knee flexion [facility]  Knee extension [facility]  Ankle dorsiflexion [facility]    Ankle plantarflexion      Ankle inversion      Ankle eversion       (Blank rows = not tested) MMT:   MMT Right 08/17/2021 Left 08/17/2021  Hip flexion [facility]  Hip extension 4/5 4/5  Hip abduction 4/5 4/5  Hip adduction      Hip internal rotation      Hip external rotation  Knee flexion 4/5 4/5  Knee extension 5/5 4/5  Ankle dorsiflexion      Ankle plantarflexion      Ankle inversion      Ankle eversion      (Blank rows = not tested)   TRANSFERS: 08/17/2021 Sit to stand: SBA and requires UE assist Stand to sit: SBA   GAIT: 08/17/2021 Gait pattern: step through pattern, decreased step length- Right, decreased stance time- Left, decreased hip/knee flexion- Left, circumduction- Left, antalgic, and abducted- Left Distance walked: 150' Assistive device utilized: Environmental consultant - 2 wheeled and TTA prosthesis Level of assistance: SBA Gait velocity: 2.57 ft/sec Comments: decreased stance time LLE, decreased step length RLE, flexed trunk, abducted LLE.   FUNCTIONAL TESTs:  Merrilee Jansky Balance Scale: 08/17/2021 29/56   [facility] PT Assessment - 08/17/21 1250                 Standardized Balance Assessment    Standardized Balance Assessment Berg Balance Test          Berg Balance Test    Sit to Stand Able to stand  independently using hands     Standing Unsupported Able to stand 2 minutes with supervision     Sitting with Back Unsupported but Feet Supported on Floor or Stool Able to sit safely and securely 2 minutes     Stand to Sit Controls descent by using hands     Transfers Able to transfer safely, definite need of hands     Standing Unsupported with Eyes Closed Able to stand 10 seconds with supervision     Standing Unsupported with Feet Together Able to place feet together independently and stand for 1 minute with supervision     From Standing, Reach Forward with Outstretched Arm Reaches forward but needs supervision     From Standing Position, Pick up Object from [place] to pick up shoe, needs supervision     From Standing Position, Turn to Look Behind Over each Shoulder Needs supervision when turning     Turn 360 Degrees Needs assistance while turning     Standing Unsupported, Alternately Place Feet on Step/Stool Needs assistance to keep from falling or unable to try     Standing Unsupported, One Foot in Front Needs help to step but can hold 15 seconds     Standing on One Leg Tries to lift leg/unable to hold 3 seconds but remains standing independently     Total Score 29     Berg comment: BERG  < 36 high risk for falls (close to 100%) 46-51 moderate (>50%)   37-45 significant (>80%) 52-55 lower (> 25%)                     CURRENT PROSTHETIC WEAR ASSESSMENT: 08/17/2021 Patient is dependent with: skin check, residual limb care, prosthetic cleaning, ply sock cleaning, correct ply sock adjustment, proper wear schedule/adjustment, and proper weight-bearing schedule/adjustment Donning prosthesis: SBA Doffing prosthesis: SBA Prosthetic wear tolerance: 1.5 hours/day for 1 day since delivery Prosthetic weight bearing tolerance: 5 minutes  with partial weight on prosthesis with no c/o discomfort. Edema: pitting Residual limb condition: 5 scabs on incision with no signs of infection & no palpation of internal sutures working out, good hair growth,  normal color, temperature & moisture, cylindrical shape. Prosthetic description: silicon liner with pin lock, total contact socket, dynamic response foot with rotation     TODAY'S TREATMENT:  09/01/2021 Prosthetic Training with TTA prosthesis: Wound is healing. She is wearing underliner  sock checking every 4 hours.  She is wearing prosthesis most of awake hours. PT recommends to continue use of underliner. PT adjusted SBQC that pt purchased.  Pt ambulated 100' with SBQC with cues on step width & upright posture.   Therapeutic Activities: Stairs with TTA prosthesis: PT verbal & tactile cues on technique. Alternating 11 steps with 2 rails with cues only. Alternating with single rail & SBQC with minA.  Weakness is primary limiting factor.  PT reviewed lifting technique. Pt able to lift 18# box from upper & lower handles with supervision / verbal cues.  Pt able to carry 18" box 10' X 2 with supervision. PT recommended pt standing for ADLs 15 min of ea hour for 8-10 hours to build up tolerance for return to work. Pt verbalized understanding.   Neuromuscular Re-ed: Side stepping engaging prosthetic LE & turning 90* with PT cues on technique.   Therapeutic Exercise: Per pt request PT educated pt on set up & use of treadmill and recumbent bike with TTA prosthesis. Pt amb on treadmill at 1.76mph with BUE support (cues to decrease lifting with UEs) for 2 min.  Pt rode recumbent bike level 1 for 2 min. PT educated pt on increasing time using sets. Pt verbalized understanding.    08/30/2021 Prosthetic Training with TTA prosthesis: Pt arrived ambulating without device but significant antalgic pattern. PT educated on gradual increase in weight bearing. PT recommending short distances like within  home without device, community with cane. Pt ambulated with cane with only minimal antalgic pattern.  Pt verbalized understanding.  PT measured calf of residual limb for under liner for size IV. PT instructed in use of under liner. Pt verbalized understanding.   Wound is same size but white perimeter indicating too moist.  Pt demo & verbal cues on picking up items from floor. Pt return demo understanding. PT demo & verbal cues on lifting 18# box. Pt able to perform 3 reps with verbal cues. Progressed to turning 90* & placing box on waist high table. Pt performed 1 rep ea to right & left from floor to table to floor. Ramp training: treadmill uphill & downhill 1.42mph up to 10% incline. PT cueing on weight shift & step length on prosthesis. Carryover to 12* incline in PT clinic with touch on window seal with tactile & verbal cues on technique.  PT & pt watched YouTube video from [place] on technique with PT note issues.   08/25/21 Gait without AD and with prosthesis with overall supervision 300 feet Ramp negotiation with prosthesis no AD with min A X 3 times, provided cuing and demo for technique Step ups 6 inch up with Rt and down in front with left in bars with bilat UE suppport X 10 Sidestepping in bars 3 round trips without UE support Tandem balance in bars 30 sec  X2 bilat Box lift X 5 18# floor to chair Push/Pull 18# box using roller chair to simulate pallet jack she would have to use at work [employer] pull backwards walkouts 20# X 5 Leg press 100# DL X 20, then Lt leg only 50# X 20  PROSTHETIC CARE / TRAINING: Wear time 2hrs on 2 hrs off for 6-8 hours total for last week. PT recommended increasing to 4hrs on (drying limb & liner half way) 2hrs off 3x/day.  Limb condition: wound on incision at distal tibia open appears ~1cm deep. No signs of infection.   Education details: Skin check, Residual limb care, Correct ply sock adjustment, Propper  donning, and Proper wear schedule/adjustment,  difference bw limb pain / phantom sensation & phantom pain and see pt instructions including increasing activity tolerance. Person educated: Patient Education method: Explanation, Demonstration, Tactile cues, Verbal cues, Handouts  Education comprehension: verbalized understanding, returned demonstration, verbal cues required, tactile cues required, and needs further education       ASSESSMENT:   CLINICAL IMPRESSION: Pt improved lifting & carrying with PT instructions. Pt appears safe to neg stairs alt pattern with 2 rails.  She appears to understand use of treadmill, bike & PT recommendations for standing ADLs to improve tolerance.    OBJECTIVE IMPAIRMENTS Abnormal gait, decreased activity tolerance, decreased balance, decreased endurance, decreased knowledge of use of DME, decreased mobility, increased edema, and prosthetic dependency .    ACTIVITY LIMITATIONS community activity and occupation.    PERSONAL FACTORS Fitness, Time since onset of injury/illness/exacerbation, and 3+ comorbidities: see PMH  are also affecting patient's functional outcome.    REHAB POTENTIAL: Good   CLINICAL DECISION MAKING: Stable/uncomplicated   EVALUATION COMPLEXITY: Low     GOALS: Goals reviewed with patient? Yes   SHORT TERM GOALS: Target date: 09/17/2021   Patient donnes prosthesis modified independent & verbalizes proper cleaning. Baseline: SEE OBJECTIVE DATA Goal status: INITIAL 2.  Patient tolerates prosthesis >14 hrs total /day without skin issues or limb pain. Baseline: SEE OBJECTIVE DATA Goal status: INITIAL   3.  Patient able pick up items from floor and reach 10" without UE support safely. Baseline: SEE OBJECTIVE DATA Goal status: INITIAL   4. Patient ambulates 200' with cane & prosthesis with supervision. Baseline: SEE OBJECTIVE DATA Goal status: INITIAL   5. Patient negotiates ramps & curbs with cane & prosthesis with supervision. Baseline: SEE OBJECTIVE DATA Goal status:  INITIAL     LONG TERM GOALS: Target date: 10/14/2021   Patient demonstrates & verbalized understanding of prosthetic care to enable safe utilization of prosthesis. Baseline: SEE OBJECTIVE DATA Goal status: INITIAL   Patient tolerates prosthesis wear >90% of awake hours without skin or limb pain issues. Baseline: SEE OBJECTIVE DATA Goal status: INITIAL   Functional Gait Assessment <22/30 to indicate lower fall risk Baseline: SEE OBJECTIVE DATA Goal status: INITIAL   Patient ambulates >500' with prosthesis only independently Baseline: SEE OBJECTIVE DATA Goal status: INITIAL   Patient negotiates ramps, curbs & stairs with single rail with prosthesis only independently. Baseline: SEE OBJECTIVE DATA Goal status: INITIAL   Patient demonstrates & verbalizes lifting, carrying, pushing, pulling with prosthesis only safely. Baseline: SEE OBJECTIVE DATA Goal status: INITIAL   Berg Balance >/= 52/56 to indicate low fall risk. Baseline: SEE OBJECTIVE DATA Goal status: INITIAL   PLAN: PT FREQUENCY: 1-2x/week   PT DURATION: 8 weeks   PLANNED INTERVENTIONS: Therapeutic exercises, Therapeutic activity, Neuromuscular re-education, Balance training, Gait training, Patient/Family education, Stair training, Vestibular training, Prosthetic training, Taping, and Manual therapy   PLAN FOR NEXT SESSION:  progress functional activities, balance & gait with TTA prosthesis.   Jamey Reas, PT, DPT 09/01/2021, 4:39 PM

## 2021-09-08 ENCOUNTER — Telehealth: Payer: Self-pay | Admitting: Orthopedic Surgery

## 2021-09-08 ENCOUNTER — Ambulatory Visit (INDEPENDENT_AMBULATORY_CARE_PROVIDER_SITE_OTHER): Payer: Managed Care, Other (non HMO) | Admitting: Physical Therapy

## 2021-09-08 ENCOUNTER — Encounter: Payer: Self-pay | Admitting: Physical Therapy

## 2021-09-08 DIAGNOSIS — R2689 Other abnormalities of gait and mobility: Secondary | ICD-10-CM | POA: Diagnosis not present

## 2021-09-08 DIAGNOSIS — R2681 Unsteadiness on feet: Secondary | ICD-10-CM | POA: Diagnosis not present

## 2021-09-08 DIAGNOSIS — M6281 Muscle weakness (generalized): Secondary | ICD-10-CM | POA: Diagnosis not present

## 2021-09-08 NOTE — Telephone Encounter (Signed)
Called and lm on vm to advise that I can do the work note for her just want to make sure that it does not need to include any type of accomodation. If it does to call and let me know. Pt has an appt on 09/20/21 if she wants to discuss and pick up note at that time. To call and advise.

## 2021-09-08 NOTE — Therapy (Signed)
OUTPATIENT PHYSICAL THERAPY PROSTHETIC TREATMENT NOTE   Patient Name: Lori Schmidt MRN: [id number] DOB:[birth year], 46 y.o., female Today's Date: 09/08/2021  PCP: Nena Polio, NP REFERRING PROVIDER: Meridee Score, MD   PT End of Session - 09/08/21 1347     Visit Number 6    Number of Visits 11    Date for PT Re-Evaluation 10/14/21    Authorization Type [employer]    Authorization Time Period 50% COINSURANCE AFTER DED HAS BEEN MET, DED $3600, OOP $18000, NO VISIT LIMIT/REF#[id number]    PT Start Time 1345    PT Stop Time 1429    PT Time Calculation (min) 44 min    Equipment Utilized During Treatment Gait belt    Activity Tolerance Patient tolerated treatment well    Behavior During Therapy WFL for tasks assessed/performed                Past Medical History:  Diagnosis Date   Hyperlipidemia    Hypertension    Obesity    Type 2 diabetes mellitus ([place])    Past Surgical History:  Procedure Laterality Date   AMPUTATION Left 02/19/2021   Procedure: AMPUTATION BELOW KNEE;  Surgeon: Newt Minion, MD;  Location: [facility];  Service: Orthopedics;  Laterality: Left;   BREAST CYST EXCISION Right 05/2018   four cysts removed on right breast/axilla area   TEE WITHOUT CARDIOVERSION N/A 02/23/2021   Procedure: TRANSESOPHAGEAL ECHOCARDIOGRAM (TEE);  Surgeon: Pixie Casino, MD;  Location: [facility] ENDOSCOPY;  Service: Cardiovascular;  Laterality: N/A;   Patient Active Problem List   Diagnosis Date Noted   Dyslipidemia    Hypoalbuminemia due to protein-calorie malnutrition ([place])    Diabetic peripheral neuropathy ([place])    Acute blood loss anemia    S/P BKA (below knee amputation) unilateral, left ([place]) 03/05/2021   Left below-knee amputee ([place]) 03/05/2021   Adjustment disorder with mixed anxiety and depressed mood    Pressure injury of skin 02/28/2021   Discitis of lumbar region    Subacute osteomyelitis, left ankle and foot ([place])    Diabetes mellitus ([place])    Left leg cellulitis     Bacteremia due to Staphylococcus aureus 02/17/2021   Morbid obesity with BMI of 45.0-49.9, adult ([place]) 01/05/2012    REFERRING DIAG: V56.433 (ICD-10-CM) - History of left below knee amputation  ONSET DATE: 08/16/2021 prosthesis delivery  THERAPY DIAG:  Other abnormalities of gait and mobility  Unsteadiness on feet  Muscle weakness (generalized)  PERTINENT HISTORY: DM2, obesity, depression, HLD  PRECAUTIONS: Fall  SUBJECTIVE: She is wearing prosthesis all awake hours without issues.  She wants to go back to work in 2 weeks. They will modify her standing time.   PAIN:  Are you having pain?  No  OBJECTIVE:    POSTURE: 08/17/2021 weight shift right   LE ROM:   Active ROM Right 08/17/2021 Left 08/17/2021  Hip flexion [facility]  Hip extension Standing -10* Standing  -10*  Hip abduction [facility]  Hip adduction      Hip internal rotation      Hip external rotation      Knee flexion [facility]  Knee extension [facility]  Ankle dorsiflexion [facility]    Ankle plantarflexion      Ankle inversion      Ankle eversion       (Blank rows = not tested) MMT:   MMT Right 08/17/2021 Left 08/17/2021  Hip flexion [facility]  Hip extension 4/5 4/5  Hip abduction 4/5  4/5  Hip adduction      Hip internal rotation      Hip external rotation      Knee flexion 4/5 4/5  Knee extension 5/5 4/5  Ankle dorsiflexion      Ankle plantarflexion      Ankle inversion      Ankle eversion      (Blank rows = not tested)   TRANSFERS: 08/17/2021 Sit to stand: SBA and requires UE assist Stand to sit: SBA   GAIT: 08/17/2021 Gait pattern: step through pattern, decreased step length- Right, decreased stance time- Left, decreased hip/knee flexion- Left, circumduction- Left, antalgic, and abducted- Left Distance walked: 150' Assistive device utilized: Environmental consultant - 2 wheeled and TTA prosthesis Level of assistance: SBA Gait velocity: 2.57 ft/sec Comments: decreased stance time LLE, decreased step length RLE, flexed trunk,  abducted LLE.   FUNCTIONAL TESTs:  Merrilee Jansky Balance Scale: 08/17/2021 29/56   [facility] PT Assessment - 08/17/21 1250                Standardized Balance Assessment    Standardized Balance Assessment Berg Balance Test          Berg Balance Test    Sit to Stand Able to stand  independently using hands     Standing Unsupported Able to stand 2 minutes with supervision     Sitting with Back Unsupported but Feet Supported on Floor or Stool Able to sit safely and securely 2 minutes     Stand to Sit Controls descent by using hands     Transfers Able to transfer safely, definite need of hands     Standing Unsupported with Eyes Closed Able to stand 10 seconds with supervision     Standing Unsupported with Feet Together Able to place feet together independently and stand for 1 minute with supervision     From Standing, Reach Forward with Outstretched Arm Reaches forward but needs supervision     From Standing Position, Pick up Object from [place] to pick up shoe, needs supervision     From Standing Position, Turn to Look Behind Over each Shoulder Needs supervision when turning     Turn 360 Degrees Needs assistance while turning     Standing Unsupported, Alternately Place Feet on Step/Stool Needs assistance to keep from falling or unable to try     Standing Unsupported, One Foot in Front Needs help to step but can hold 15 seconds     Standing on One Leg Tries to lift leg/unable to hold 3 seconds but remains standing independently     Total Score 29     Berg comment: BERG  < 36 high risk for falls (close to 100%) 46-51 moderate (>50%)   37-45 significant (>80%) 52-55 lower (> 25%)                     CURRENT PROSTHETIC WEAR ASSESSMENT: 08/17/2021 Patient is dependent with: skin check, residual Schmidt care, prosthetic cleaning, ply sock cleaning, correct ply sock adjustment, proper wear schedule/adjustment, and proper weight-bearing schedule/adjustment Donning prosthesis: SBA Doffing prosthesis:  SBA Prosthetic wear tolerance: 1.5 hours/day for 1 day since delivery Prosthetic weight bearing tolerance: 5 minutes with partial weight on prosthesis with no c/o discomfort. Edema: pitting Residual Schmidt condition: 5 scabs on incision with no signs of infection & no palpation of internal sutures working out, good hair growth,  normal color, temperature & moisture, cylindrical shape. Prosthetic description: silicon liner with pin lock, total contact socket,  dynamic response foot with rotation     TODAY'S TREATMENT:  09/08/2021 Prosthetic Training with TTA prosthesis: Wound is healing. She is wearing underliner sock checking every 4 hours.  She is wearing prosthesis most of awake hours. PT recommends to continue use of underliner and clean wound with Q-tip then rinse with water. Pt verbalized understanding. PT demo & verbal cues on sit to stand decreasing UE assist. Then initiating gait without hesitancy.  Pt performed amb 10' without stopping for 5 reps. She improved with instruction & reps. PT recommended humming familiar song while ambulating to improve equal stance duration. Pt ambulated 130' while humming with noted improved prosthetic stance duration.  Therapeutic Activity Upon pt request, PT completed her return to work forms.  See Media for form.  PT recommending starting back 6/14 for 4 hrs/day for 2 weeks, then 6hours / day for 2 weeks and full time on 7/12 and enable sitting intermittently for rest as needed. Pt verbalized agreement & understanding.  Neuromuscular Re-ed: Standing on prosthesis with LUE support & on RLE without UE support but need to intermittently touch:  rolling tennis ball ant/post, med/lat & circles CW / CCW.  5-10 reps per LE for 2 sets. Pt improved balance with 2nd set.  Pt verbalized understanding as HEP.   09/01/2021 Prosthetic Training with TTA prosthesis: Wound is healing. She is wearing underliner sock checking every 4 hours.  She is wearing prosthesis most of  awake hours. PT recommends to continue use of underliner. PT adjusted SBQC that pt purchased.  Pt ambulated 100' with SBQC with cues on step width & upright posture.   Therapeutic Activities: Stairs with TTA prosthesis: PT verbal & tactile cues on technique. Alternating 11 steps with 2 rails with cues only. Alternating with single rail & SBQC with minA.  Weakness is primary limiting factor.  PT reviewed lifting technique. Pt able to lift 18# box from upper & lower handles with supervision / verbal cues.  Pt able to carry 18" box 10' X 2 with supervision. PT recommended pt standing for ADLs 15 min of ea hour for 8-10 hours to build up tolerance for return to work. Pt verbalized understanding.   Neuromuscular Re-ed: Side stepping engaging prosthetic LE & turning 90* with PT cues on technique.   Therapeutic Exercise: Per pt request PT educated pt on set up & use of treadmill and recumbent bike with TTA prosthesis. Pt amb on treadmill at 1.74mh with BUE support (cues to decrease lifting with UEs) for 2 min.  Pt rode recumbent bike level 1 for 2 min. PT educated pt on increasing time using sets. Pt verbalized understanding.    08/30/2021 Prosthetic Training with TTA prosthesis: Pt arrived ambulating without device but significant antalgic pattern. PT educated on gradual increase in weight bearing. PT recommending short distances like within home without device, community with cane. Pt ambulated with cane with only minimal antalgic pattern.  Pt verbalized understanding.  PT measured calf of residual Schmidt for under liner for size IV. PT instructed in use of under liner. Pt verbalized understanding.   Wound is same size but white perimeter indicating too moist.  Pt demo & verbal cues on picking up items from floor. Pt return demo understanding. PT demo & verbal cues on lifting 18# box. Pt able to perform 3 reps with verbal cues. Progressed to turning 90* & placing box on waist high table. Pt performed  1 rep ea to right & left from floor to table to floor. Ramp  training: treadmill uphill & downhill 1.19mh up to 10% incline. PT cueing on weight shift & step length on prosthesis. Carryover to 12* incline in PT clinic with touch on window seal with tactile & verbal cues on technique.  PT & pt watched YouTube video from [place] technique with PT note issues.       ASSESSMENT:   CLINICAL IMPRESSION: Pt improved sit to/from stand with less UE support.  Pt improved equal stance duration with instruction.  Pt seems to understand HEP for balance with tennis ball rolls.    OBJECTIVE IMPAIRMENTS Abnormal gait, decreased activity tolerance, decreased balance, decreased endurance, decreased knowledge of use of DME, decreased mobility, increased edema, and prosthetic dependency .    ACTIVITY LIMITATIONS community activity and occupation.    PERSONAL FACTORS Fitness, Time since onset of injury/illness/exacerbation, and 3+ comorbidities: see PMH  are also affecting patient's functional outcome.    REHAB POTENTIAL: Good   CLINICAL DECISION MAKING: Stable/uncomplicated   EVALUATION COMPLEXITY: Low     GOALS: Goals reviewed with patient? Yes   SHORT TERM GOALS: Target date: 09/17/2021   Patient donnes prosthesis modified independent & verbalizes proper cleaning. Baseline: SEE OBJECTIVE DATA Goal status: INITIAL 2.  Patient tolerates prosthesis >14 hrs total /day without skin issues or Schmidt pain. Baseline: SEE OBJECTIVE DATA Goal status: INITIAL   3.  Patient able pick up items from floor and reach 10" without UE support safely. Baseline: SEE OBJECTIVE DATA Goal status: INITIAL   4. Patient ambulates 200' with cane & prosthesis with supervision. Baseline: SEE OBJECTIVE DATA Goal status: INITIAL   5. Patient negotiates ramps & curbs with cane & prosthesis with supervision. Baseline: SEE OBJECTIVE DATA Goal status: INITIAL     LONG TERM GOALS: Target date: 10/14/2021   Patient  demonstrates & verbalized understanding of prosthetic care to enable safe utilization of prosthesis. Baseline: SEE OBJECTIVE DATA Goal status: INITIAL   Patient tolerates prosthesis wear >90% of awake hours without skin or Schmidt pain issues. Baseline: SEE OBJECTIVE DATA Goal status: INITIAL   Functional Gait Assessment <22/30 to indicate lower fall risk Baseline: SEE OBJECTIVE DATA Goal status: INITIAL   Patient ambulates >500' with prosthesis only independently Baseline: SEE OBJECTIVE DATA Goal status: INITIAL   Patient negotiates ramps, curbs & stairs with single rail with prosthesis only independently. Baseline: SEE OBJECTIVE DATA Goal status: INITIAL   Patient demonstrates & verbalizes lifting, carrying, pushing, pulling with prosthesis only safely. Baseline: SEE OBJECTIVE DATA Goal status: INITIAL   Berg Balance >/= 52/56 to indicate low fall risk. Baseline: SEE OBJECTIVE DATA Goal status: INITIAL   PLAN: PT FREQUENCY: 1-2x/week   PT DURATION: 8 weeks   PLANNED INTERVENTIONS: Therapeutic exercises, Therapeutic activity, Neuromuscular re-education, Balance training, Gait training, Patient/Family education, Stair training, Vestibular training, Prosthetic training, Taping, and Manual therapy   PLAN FOR NEXT SESSION:  1x/wk with PT progressing functional activities, balance & gait with TTA prosthesis for home activities.  RJamey Reas PT, DPT 09/08/2021, 4:00 PM

## 2021-09-08 NOTE — Telephone Encounter (Signed)
Pt returned call to Lori Schmidt. Pt states she need her work note to say return to work September 22, 2021 . Pt can work 4-5 hours with stool for 2 weeks. Pt states after the 2 weeks she can go to full duty but stool still needed as long as needed. Pt is asking to pick not up tomorrow. Please call pt when ready for pick up. Pt phone number is [phone].

## 2021-09-08 NOTE — Telephone Encounter (Signed)
Pt called requesting a back to work note on 06/16.   Cb [phone]

## 2021-09-09 ENCOUNTER — Other Ambulatory Visit: Payer: Self-pay

## 2021-09-09 NOTE — Telephone Encounter (Signed)
Note written

## 2021-09-09 NOTE — Telephone Encounter (Signed)
Pt states she need her work note to say return to work September 22, 2021 . Pt can work 4-5 hours with stool for 2 weeks. Pt states after the 2 weeks she can go to full duty but stool still needed as long as needed. Patient is here currently In lobby on XU/DEAN side

## 2021-09-13 ENCOUNTER — Encounter: Payer: Self-pay | Admitting: Physical Therapy

## 2021-09-13 ENCOUNTER — Ambulatory Visit (INDEPENDENT_AMBULATORY_CARE_PROVIDER_SITE_OTHER): Payer: Managed Care, Other (non HMO) | Admitting: Physical Therapy

## 2021-09-13 DIAGNOSIS — R2681 Unsteadiness on feet: Secondary | ICD-10-CM | POA: Diagnosis not present

## 2021-09-13 DIAGNOSIS — R2689 Other abnormalities of gait and mobility: Secondary | ICD-10-CM | POA: Diagnosis not present

## 2021-09-13 DIAGNOSIS — M6281 Muscle weakness (generalized): Secondary | ICD-10-CM

## 2021-09-13 NOTE — Therapy (Signed)
OUTPATIENT PHYSICAL THERAPY PROSTHETIC TREATMENT NOTE   Patient Name: Lori Schmidt MRN: [id number] DOB:May 20, 1975, 46 y.o., female Today's Date: 09/13/2021  PCP: Nena Polio, NP REFERRING PROVIDER: Meridee Score, MD   PT End of Session - 09/13/21 1351     Visit Number 7    Number of Visits 11    Date for PT Re-Evaluation 10/14/21    Authorization Type [employer]    Authorization Time Period 50% COINSURANCE AFTER DED HAS BEEN MET, DED $3600, OOP $18000, NO VISIT LIMIT/REF#[id number]    PT Start Time 1345    PT Stop Time 1430    PT Time Calculation (min) 45 min    Equipment Utilized During Treatment Gait belt    Activity Tolerance Patient tolerated treatment well    Behavior During Therapy WFL for tasks assessed/performed                 Past Medical History:  Diagnosis Date   Hyperlipidemia    Hypertension    Obesity    Type 2 diabetes mellitus ([place])    Past Surgical History:  Procedure Laterality Date   AMPUTATION Left 02/19/2021   Procedure: AMPUTATION BELOW KNEE;  Surgeon: Newt Minion, MD;  Location: [facility];  Service: Orthopedics;  Laterality: Left;   BREAST CYST EXCISION Right 05/2018   four cysts removed on right breast/axilla area   TEE WITHOUT CARDIOVERSION N/A 02/23/2021   Procedure: TRANSESOPHAGEAL ECHOCARDIOGRAM (TEE);  Surgeon: Pixie Casino, MD;  Location: [facility] ENDOSCOPY;  Service: Cardiovascular;  Laterality: N/A;   Patient Active Problem List   Diagnosis Date Noted   Dyslipidemia    Hypoalbuminemia due to protein-calorie malnutrition ([place])    Diabetic peripheral neuropathy ([place])    Acute blood loss anemia    S/P BKA (below knee amputation) unilateral, left ([place]) 03/05/2021   Left below-knee amputee ([place]) 03/05/2021   Adjustment disorder with mixed anxiety and depressed mood    Pressure injury of skin 02/28/2021   Discitis of lumbar region    Subacute osteomyelitis, left ankle and foot ([place])    Diabetes mellitus ([place])    Left leg cellulitis     Bacteremia due to Staphylococcus aureus 02/17/2021   Morbid obesity with BMI of 45.0-49.9, adult ([place]) 01/05/2012    REFERRING DIAG: V69.450 (ICD-10-CM) - History of left below knee amputation  ONSET DATE: 08/16/2021 prosthesis delivery  THERAPY DIAG:  Other abnormalities of gait and mobility  Unsteadiness on feet  Muscle weakness (generalized)  PERTINENT HISTORY: DM2, obesity, depression, HLD  PRECAUTIONS: Fall  SUBJECTIVE: Her son lost his job so she wants to reduce PT frequency. She is wearing prosthesis all awake hours without issues.   PAIN:  Are you having pain?   No  OBJECTIVE:    POSTURE: 08/17/2021 weight shift right   LE ROM:   Active ROM Right 08/17/2021 Left 08/17/2021  Hip flexion [facility]  Hip extension Standing -10* Standing  -10*  Hip abduction [facility]  Hip adduction      Hip internal rotation      Hip external rotation      Knee flexion [facility]  Knee extension [facility]  Ankle dorsiflexion [facility]    Ankle plantarflexion      Ankle inversion      Ankle eversion       (Blank rows = not tested) MMT:   MMT Right 08/17/2021 Left 08/17/2021  Hip flexion [facility]  Hip extension 4/5 4/5  Hip abduction 4/5 4/5  Hip  adduction      Hip internal rotation      Hip external rotation      Knee flexion 4/5 4/5  Knee extension 5/5 4/5  Ankle dorsiflexion      Ankle plantarflexion      Ankle inversion      Ankle eversion      (Blank rows = not tested)   TRANSFERS: 08/17/2021 Sit to stand: SBA and requires UE assist Stand to sit: SBA   GAIT: 08/17/2021 Gait pattern: step through pattern, decreased step length- Right, decreased stance time- Left, decreased hip/knee flexion- Left, circumduction- Left, antalgic, and abducted- Left Distance walked: 150' Assistive device utilized: Environmental consultant - 2 wheeled and TTA prosthesis Level of assistance: SBA Gait velocity: 2.57 ft/sec Comments: decreased stance time LLE, decreased step length RLE, flexed trunk, abducted LLE.    FUNCTIONAL TESTs:  Merrilee Jansky Balance Scale: 08/17/2021 29/56   [facility] PT Assessment - 08/17/21 1250                Standardized Balance Assessment    Standardized Balance Assessment Berg Balance Test          Berg Balance Test    Sit to Stand Able to stand  independently using hands     Standing Unsupported Able to stand 2 minutes with supervision     Sitting with Back Unsupported but Feet Supported on Floor or Stool Able to sit safely and securely 2 minutes     Stand to Sit Controls descent by using hands     Transfers Able to transfer safely, definite need of hands     Standing Unsupported with Eyes Closed Able to stand 10 seconds with supervision     Standing Unsupported with Feet Together Able to place feet together independently and stand for 1 minute with supervision     From Standing, Reach Forward with Outstretched Arm Reaches forward but needs supervision     From Standing Position, Pick up Object from [place] to pick up shoe, needs supervision     From Standing Position, Turn to Look Behind Over each Shoulder Needs supervision when turning     Turn 360 Degrees Needs assistance while turning     Standing Unsupported, Alternately Place Feet on Step/Stool Needs assistance to keep from falling or unable to try     Standing Unsupported, One Foot in Front Needs help to step but can hold 15 seconds     Standing on One Leg Tries to lift leg/unable to hold 3 seconds but remains standing independently     Total Score 29     Berg comment: BERG  < 36 high risk for falls (close to 100%) 46-51 moderate (>50%)   37-45 significant (>80%) 52-55 lower (> 25%)                     CURRENT PROSTHETIC WEAR ASSESSMENT: 08/17/2021 Patient is dependent with: skin check, residual limb care, prosthetic cleaning, ply sock cleaning, correct ply sock adjustment, proper wear schedule/adjustment, and proper weight-bearing schedule/adjustment Donning prosthesis: SBA Doffing prosthesis: SBA Prosthetic wear  tolerance: 1.5 hours/day for 1 day since delivery Prosthetic weight bearing tolerance: 5 minutes with partial weight on prosthesis with no c/o discomfort. Edema: pitting Residual limb condition: 5 scabs on incision with no signs of infection & no palpation of internal sutures working out, good hair growth,  normal color, temperature & moisture, cylindrical shape. Prosthetic description: silicon liner with pin lock, total contact socket, dynamic response foot  with rotation     TODAY'S TREATMENT:  09/13/2021 Prosthetic Training: Wound is open 19mm long with less depth noted. PT issued XXL & XL vivewear shrinker. PT demo & verbal cues on wrapping over shrinker for night time edema control. Pt verbalized understanding. PT recommended continue wear all awake hours with underliner wear and checking q4hrs for dampness. Pt verbalized understanding.  Walk looking around & maintain path / pace.  Look right/ left, up/down & diagonals. Tactile cues for maintaining path.  Walk 3 steps eyes open & eyes closed to enable improved balance if needs to walk in dark.  Step over obstacles. Step right leg close to obstacle & prosthesis over obstacle.   Neuromuscular Re-ed: Walking backwards. Tandem gait near counter forward & backward. Blue band in door. Facing door, Row with both arms. And lock hands to sides walk back & forward. Blue band in door. Back to door, reach forward with both arms. And lock hands to sides walk back & forward.   09/08/2021 Prosthetic Training with TTA prosthesis: Wound is healing. She is wearing underliner sock checking every 4 hours.  She is wearing prosthesis most of awake hours. PT recommends to continue use of underliner and clean wound with Q-tip then rinse with water. Pt verbalized understanding. PT demo & verbal cues on sit to stand decreasing UE assist. Then initiating gait without hesitancy.  Pt performed amb 10' without stopping for 5 reps. She improved with instruction &  reps. PT recommended humming familiar song while ambulating to improve equal stance duration. Pt ambulated 130' while humming with noted improved prosthetic stance duration.  Therapeutic Activity Upon pt request, PT completed her return to work forms.  See Media for form.  PT recommending starting back 6/14 for 4 hrs/day for 2 weeks, then 6hours / day for 2 weeks and full time on 7/12 and enable sitting intermittently for rest as needed. Pt verbalized agreement & understanding.  Neuromuscular Re-ed: Standing on prosthesis with LUE support & on RLE without UE support but need to intermittently touch:  rolling tennis ball ant/post, med/lat & circles CW / CCW.  5-10 reps per LE for 2 sets. Pt improved balance with 2nd set.  Pt verbalized understanding as HEP.   09/01/2021 Prosthetic Training with TTA prosthesis: Wound is healing. She is wearing underliner sock checking every 4 hours.  She is wearing prosthesis most of awake hours. PT recommends to continue use of underliner. PT adjusted SBQC that pt purchased.  Pt ambulated 100' with SBQC with cues on step width & upright posture.   Therapeutic Activities: Stairs with TTA prosthesis: PT verbal & tactile cues on technique. Alternating 11 steps with 2 rails with cues only. Alternating with single rail & SBQC with minA.  Weakness is primary limiting factor.  PT reviewed lifting technique. Pt able to lift 18# box from upper & lower handles with supervision / verbal cues.  Pt able to carry 18" box 10' X 2 with supervision. PT recommended pt standing for ADLs 15 min of ea hour for 8-10 hours to build up tolerance for return to work. Pt verbalized understanding.   Neuromuscular Re-ed: Side stepping engaging prosthetic LE & turning 90* with PT cues on technique.   Therapeutic Exercise: Per pt request PT educated pt on set up & use of treadmill and recumbent bike with TTA prosthesis. Pt amb on treadmill at 1.52mph with BUE support (cues to decrease lifting  with UEs) for 2 min.  Pt rode recumbent bike level 1 for  2 min. PT educated pt on increasing time using sets. Pt verbalized understanding.       ASSESSMENT:   CLINICAL IMPRESSION: Pt met all STGs.  Pt is progressing well with balance & gait activities with her prosthesis.     OBJECTIVE IMPAIRMENTS Abnormal gait, decreased activity tolerance, decreased balance, decreased endurance, decreased knowledge of use of DME, decreased mobility, increased edema, and prosthetic dependency .    ACTIVITY LIMITATIONS community activity and occupation.    PERSONAL FACTORS Fitness, Time since onset of injury/illness/exacerbation, and 3+ comorbidities: see PMH  are also affecting patient's functional outcome.    REHAB POTENTIAL: Good   CLINICAL DECISION MAKING: Stable/uncomplicated   EVALUATION COMPLEXITY: Low     GOALS: Goals reviewed with patient? Yes   SHORT TERM GOALS: Target date: 09/17/2021   Patient donnes prosthesis modified independent & verbalizes proper cleaning. Goal status:MET 2.  Patient tolerates prosthesis >14 hrs total /day without skin issues or limb pain. Goal status:MET   3.  Patient able pick up items from floor and reach 10" without UE support safely. BGoal status:MET   4. Patient ambulates 200' with cane & prosthesis with supervision. Goal status:MET   5. Patient negotiates ramps & curbs with cane & prosthesis with supervision. Goal status:MET     LONG TERM GOALS: Target date: 10/14/2021   Patient demonstrates & verbalized understanding of prosthetic care to enable safe utilization of prosthesis. Baseline: SEE OBJECTIVE DATA Goal status: INITIAL   Patient tolerates prosthesis wear >90% of awake hours without skin or limb pain issues. Baseline: SEE OBJECTIVE DATA Goal status: INITIAL   Functional Gait Assessment <22/30 to indicate lower fall risk Baseline: SEE OBJECTIVE DATA Goal status: INITIAL   Patient ambulates >500' with prosthesis only  independently Baseline: SEE OBJECTIVE DATA Goal status: INITIAL   Patient negotiates ramps, curbs & stairs with single rail with prosthesis only independently. Baseline: SEE OBJECTIVE DATA Goal status: INITIAL   Patient demonstrates & verbalizes lifting, carrying, pushing, pulling with prosthesis only safely. Baseline: SEE OBJECTIVE DATA Goal status: INITIAL   Berg Balance >/= 52/56 to indicate low fall risk. Baseline: SEE OBJECTIVE DATA Goal status: INITIAL   PLAN: PT FREQUENCY: 1-2x/week   PT DURATION: 8 weeks   PLANNED INTERVENTIONS: Therapeutic exercises, Therapeutic activity, Neuromuscular re-education, Balance training, Gait training, Patient/Family education, Stair training, Vestibular training, Prosthetic training, Taping, and Manual therapy   PLAN FOR NEXT SESSION:  1x/wk every other week with PT progressing functional activities, balance & gait with TTA prosthesis for home activities. Check how return to work is going.  Jamey Reas, PT, DPT 09/13/2021, 4:45 PM

## 2021-09-13 NOTE — Patient Instructions (Signed)
Walk looking around & maintain path / pace.  Look right/ left, up/down & diagonals. Walk 3 steps eyes open & eyes closed. Walk backwards. Near counter walk on line "drunk test" Step over obstacles. Step right leg close to obstacle & prosthesis over obstacle. Blue band in door. Facing door, Row with both arms. And look hands to sides walk back & forward. Blue band in door. Back to door, reach forward with both arms. And look hands to sides walk back & forward.

## 2021-09-20 ENCOUNTER — Encounter: Payer: Managed Care, Other (non HMO) | Admitting: Physical Therapy

## 2021-09-20 ENCOUNTER — Ambulatory Visit: Payer: Managed Care, Other (non HMO) | Admitting: Orthopedic Surgery

## 2021-09-20 DIAGNOSIS — Z89512 Acquired absence of left leg below knee: Secondary | ICD-10-CM | POA: Diagnosis not present

## 2021-09-29 ENCOUNTER — Encounter: Payer: Self-pay | Admitting: Physical Therapy

## 2021-09-29 ENCOUNTER — Ambulatory Visit: Payer: Managed Care, Other (non HMO) | Admitting: Physical Therapy

## 2021-09-29 DIAGNOSIS — R2689 Other abnormalities of gait and mobility: Secondary | ICD-10-CM | POA: Diagnosis not present

## 2021-09-29 DIAGNOSIS — R2681 Unsteadiness on feet: Secondary | ICD-10-CM

## 2021-09-29 DIAGNOSIS — M6281 Muscle weakness (generalized): Secondary | ICD-10-CM

## 2021-09-29 NOTE — Therapy (Signed)
OUTPATIENT PHYSICAL THERAPY PROSTHETIC TREATMENT NOTE   Patient Name: Lori Schmidt MRN: [id number] DOB:10-Oct-1975, 46 y.o., female Today's Date: 09/29/2021  PCP: Nena Polio, NP REFERRING PROVIDER: Meridee Score, MD   PT End of Session - 09/29/21 1345     Visit Number 8    Number of Visits 11    Date for PT Re-Evaluation 10/14/21    Authorization Type [employer]    Authorization Time Period 50% COINSURANCE AFTER DED HAS BEEN MET, DED $3600, OOP $18000, NO VISIT LIMIT/REF#[id number]    PT Start Time 1343    PT Stop Time 1426    PT Time Calculation (min) 43 min    Equipment Utilized During Treatment Gait belt    Activity Tolerance Patient tolerated treatment well    Behavior During Therapy WFL for tasks assessed/performed                  Past Medical History:  Diagnosis Date   Hyperlipidemia    Hypertension    Obesity    Type 2 diabetes mellitus ([place])    Past Surgical History:  Procedure Laterality Date   AMPUTATION Left 02/19/2021   Procedure: AMPUTATION BELOW KNEE;  Surgeon: Newt Minion, MD;  Location: [place];  Service: Orthopedics;  Laterality: Left;   BREAST CYST EXCISION Right 05/2018   four cysts removed on right breast/axilla area   TEE WITHOUT CARDIOVERSION N/A 02/23/2021   Procedure: TRANSESOPHAGEAL ECHOCARDIOGRAM (TEE);  Surgeon: Pixie Casino, MD;  [facility] ENDOSCOPY;  Service: Cardiovascular;  Laterality: N/A;   Patient Active Problem List   Diagnosis Date Noted   Dyslipidemia    Hypoalbuminemia due to protein-calorie malnutrition ([place])    Diabetic peripheral neuropathy ([place])    Acute blood loss anemia    S/P BKA (below knee amputation) unilateral, left ([place]) 03/05/2021   Left below-knee amputee ([place]) 03/05/2021   Adjustment disorder with mixed anxiety and depressed mood    Pressure injury of skin 02/28/2021   Discitis of lumbar region    Subacute osteomyelitis, left ankle and foot ([place])    Diabetes mellitus ([place])    Left leg  cellulitis    Bacteremia due to Staphylococcus aureus 02/17/2021   Morbid obesity with BMI of 45.0-49.9, adult ([place]) 01/05/2012    REFERRING DIAG: M09.470 (ICD-10-CM) - History of left below knee amputation  ONSET DATE: 08/16/2021 prosthesis delivery  THERAPY DIAG:  Other abnormalities of gait and mobility  Unsteadiness on feet  Muscle weakness (generalized)  PERTINENT HISTORY: DM2, obesity, depression, HLD  PRECAUTIONS: Fall  SUBJECTIVE: Lori Schmidt had issues with going back to work as they were not certain how to make accommodations. She is going back this afternoon.   PAIN:  Are you having pain?   No  OBJECTIVE:    POSTURE: 08/17/2021 weight shift right   LE ROM:   Active ROM Right 08/17/2021 Left 08/17/2021  Hip flexion [facility]  Hip extension Standing -10* Standing  -10*  Hip abduction [facility]  Hip adduction      Hip internal rotation      Hip external rotation      Knee flexion [facility]  Knee extension [facility]  Ankle dorsiflexion [facility]    Ankle plantarflexion      Ankle inversion      Ankle eversion       (Blank rows = not tested) MMT:   MMT Right 08/17/2021 Left 08/17/2021  Hip flexion [facility]  Hip extension 4/5 4/5  Hip abduction  4/5 4/5  Hip adduction      Hip internal rotation      Hip external rotation      Knee flexion 4/5 4/5  Knee extension 5/5 4/5  Ankle dorsiflexion      Ankle plantarflexion      Ankle inversion      Ankle eversion      (Blank rows = not tested)   TRANSFERS: 08/17/2021 Sit to stand: SBA and requires UE assist Stand to sit: SBA   GAIT: 08/17/2021 Gait pattern: step through pattern, decreased step length- Right, decreased stance time- Left, decreased hip/knee flexion- Left, circumduction- Left, antalgic, and abducted- Left Distance walked: 150' Assistive device utilized: Environmental consultant - 2 wheeled and TTA prosthesis Level of assistance: SBA Gait velocity: 2.57 ft/sec Comments: decreased stance time LLE, decreased step length RLE,  flexed trunk, abducted LLE.   FUNCTIONAL TESTs:  Merrilee Jansky Balance Scale: 08/17/2021 29/56   [facility] PT Assessment - 08/17/21 1250                Standardized Balance Assessment    Standardized Balance Assessment Berg Balance Test          Berg Balance Test    Sit to Stand Able to stand  independently using hands     Standing Unsupported Able to stand 2 minutes with supervision     Sitting with Back Unsupported but Feet Supported on Floor or Stool Able to sit safely and securely 2 minutes     Stand to Sit Controls descent by using hands     Transfers Able to transfer safely, definite need of hands     Standing Unsupported with Eyes Closed Able to stand 10 seconds with supervision     Standing Unsupported with Feet Together Able to place feet together independently and stand for 1 minute with supervision     From Standing, Reach Forward with Outstretched Arm Reaches forward but needs supervision     From Standing Position, Pick up Object from [place] to pick up shoe, needs supervision     From Standing Position, Turn to Look Behind Over each Shoulder Needs supervision when turning     Turn 360 Degrees Needs assistance while turning     Standing Unsupported, Alternately Place Feet on Step/Stool Needs assistance to keep from falling or unable to try     Standing Unsupported, One Foot in Front Needs help to step but can hold 15 seconds     Standing on One Leg Tries to lift leg/unable to hold 3 seconds but remains standing independently     Total Score 29     Berg comment: BERG  < 36 high risk for falls (close to 100%) 46-51 moderate (>50%)   37-45 significant (>80%) 52-55 lower (> 25%)                     CURRENT PROSTHETIC WEAR ASSESSMENT: 08/17/2021 Patient is dependent with: skin check, residual limb care, prosthetic cleaning, ply sock cleaning, correct ply sock adjustment, proper wear schedule/adjustment, and proper weight-bearing schedule/adjustment Donning prosthesis: SBA Doffing  prosthesis: SBA Prosthetic wear tolerance: 1.5 hours/day for 1 day since delivery Prosthetic weight bearing tolerance: 5 minutes with partial weight on prosthesis with no c/o discomfort. Edema: pitting Residual limb condition: 5 scabs on incision with no signs of infection & no palpation of internal sutures working out, good hair growth,  normal color, temperature & moisture, cylindrical shape. Prosthetic description: silicon liner with pin lock, total contact  socket, dynamic response foot with rotation     TODAY'S TREATMENT:  09/29/2021 Prosthetic Training with TTA prosthesis: Patient's wound has healed completely.  PT recommended continuing Vivewear underliner for sweat.  PT demo & verbal cues on measuring her limb for underliner size when she purchases more.  PT recommended wrapping over night shrinker to minimize volume changes within her day which is frustrating. Pt verbalized understanding PT instructed in adjusting ply socks & drying sweat at work with long pants. Pt verbalized understanding. Pt demo & instructed in stepping on & off escalator or moving walk way like airports. Pt performed to/from step to moving treadmill belt.  Pt verbalizes better understanding.     09/13/2021 Prosthetic Training: Wound is open 3mm long with less depth noted. PT issued XXL & XL vivewear shrinker. PT demo & verbal cues on wrapping over shrinker for night time edema control. Pt verbalized understanding. PT recommended continue wear all awake hours with underliner wear and checking q4hrs for dampness. Pt verbalized understanding.  Walk looking around & maintain path / pace.  Look right/ left, up/down & diagonals. Tactile cues for maintaining path.  Walk 3 steps eyes open & eyes closed to enable improved balance if needs to walk in dark.  Step over obstacles. Step right leg close to obstacle & prosthesis over obstacle.   Neuromuscular Re-ed: Walking backwards. Tandem gait near counter forward &  backward. Blue band in door. Facing door, Row with both arms. And lock hands to sides walk back & forward. Blue band in door. Back to door, reach forward with both arms. And lock hands to sides walk back & forward.   09/08/2021 Prosthetic Training with TTA prosthesis: Wound is healing. She is wearing underliner sock checking every 4 hours.  She is wearing prosthesis most of awake hours. PT recommends to continue use of underliner and clean wound with Q-tip then rinse with water. Pt verbalized understanding. PT demo & verbal cues on sit to stand decreasing UE assist. Then initiating gait without hesitancy.  Pt performed amb 10' without stopping for 5 reps. She improved with instruction & reps. PT recommended humming familiar song while ambulating to improve equal stance duration. Pt ambulated 130' while humming with noted improved prosthetic stance duration.  Therapeutic Activity Upon pt request, PT completed her return to work forms.  See Media for form.  PT recommending starting back 6/14 for 4 hrs/day for 2 weeks, then 6hours / day for 2 weeks and full time on 7/12 and enable sitting intermittently for rest as needed. Pt verbalized agreement & understanding.  Neuromuscular Re-ed: Standing on prosthesis with LUE support & on RLE without UE support but need to intermittently touch:  rolling tennis ball ant/post, med/lat & circles CW / CCW.  5-10 reps per LE for 2 sets. Pt improved balance with 2nd set.  Pt verbalized understanding as HEP.     ASSESSMENT:   CLINICAL IMPRESSION: Patient improved ability to get on & off escalator with prosthesis.  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     OBJECTIVE IMPAIRMENTS Abnormal gait, decreased activity tolerance, decreased balance, decreased endurance, decreased knowledge of use of DME, decreased mobility, increased edema, and prosthetic dependency .    ACTIVITY LIMITATIONS community activity and occupation.     PERSONAL FACTORS Fitness, Time since onset of injury/illness/exacerbation, and 3+ comorbidities: see PMH  are also affecting patient's functional outcome.    REHAB POTENTIAL: Good   CLINICAL DECISION MAKING:  Stable/uncomplicated   EVALUATION COMPLEXITY: Low     GOALS: Goals reviewed with patient? Yes   SHORT TERM GOALS: Target date: 09/17/2021   Patient donnes prosthesis modified independent & verbalizes proper cleaning. Goal status:MET 2.  Patient tolerates prosthesis >14 hrs total /day without skin issues or limb pain. Goal status:MET   3.  Patient able pick up items from floor and reach 10" without UE support safely. BGoal status:MET   4. Patient ambulates 200' with cane & prosthesis with supervision. Goal status:MET   5. Patient negotiates ramps & curbs with cane & prosthesis with supervision. Goal status:MET     LONG TERM GOALS: Target date: 10/14/2021   Patient demonstrates & verbalized understanding of prosthetic care to enable safe utilization of prosthesis. Baseline: SEE OBJECTIVE DATA Goal status: INITIAL   Patient tolerates prosthesis wear >90% of awake hours without skin or limb pain issues. Baseline: SEE OBJECTIVE DATA Goal status: INITIAL   Functional Gait Assessment <22/30 to indicate lower fall risk Baseline: SEE OBJECTIVE DATA Goal status: INITIAL   Patient ambulates >500' with prosthesis only independently Baseline: SEE OBJECTIVE DATA Goal status: INITIAL   Patient negotiates ramps, curbs & stairs with single rail with prosthesis only independently. Baseline: SEE OBJECTIVE DATA Goal status: INITIAL   Patient demonstrates & verbalizes lifting, carrying, pushing, pulling with prosthesis only safely. Baseline: SEE OBJECTIVE DATA Goal status: INITIAL   Berg Balance >/= 52/56 to indicate low fall risk. Baseline: SEE OBJECTIVE DATA Goal status: INITIAL   PLAN: PT FREQUENCY: 1-2x/week   PT DURATION: 8 weeks   PLANNED INTERVENTIONS: Therapeutic  exercises, Therapeutic activity, Neuromuscular re-education, Balance training, Gait training, Patient/Family education, Stair training, Vestibular training, Prosthetic training, Taping, and Manual therapy   PLAN FOR NEXT SESSION:  check & address any work issues.  Check LTGs and discharge.   Jamey Reas, PT, DPT 09/29/2021, 4:34 PM

## 2021-09-30 ENCOUNTER — Encounter: Payer: Self-pay | Admitting: Orthopedic Surgery

## 2021-10-06 ENCOUNTER — Encounter: Payer: Managed Care, Other (non HMO) | Admitting: Physical Therapy

## 2021-10-13 ENCOUNTER — Ambulatory Visit (INDEPENDENT_AMBULATORY_CARE_PROVIDER_SITE_OTHER): Payer: Managed Care, Other (non HMO) | Admitting: Physical Therapy

## 2021-10-13 ENCOUNTER — Encounter: Payer: Self-pay | Admitting: Physical Therapy

## 2021-10-13 DIAGNOSIS — R2681 Unsteadiness on feet: Secondary | ICD-10-CM

## 2021-10-13 DIAGNOSIS — M6281 Muscle weakness (generalized): Secondary | ICD-10-CM

## 2021-10-13 DIAGNOSIS — R2689 Other abnormalities of gait and mobility: Secondary | ICD-10-CM | POA: Diagnosis not present

## 2021-10-13 NOTE — Therapy (Signed)
OUTPATIENT PHYSICAL THERAPY PROSTHETIC TREATMENT NOTE & DISCHARGE SUMMARY   Patient Name: Lori Schmidt MRN: [id number] DOB:1975/08/22, 46 y.o., female Today's Date: 10/13/2021  PCP: Nena Polio, NP REFERRING PROVIDER: Meridee Score, MD  PHYSICAL THERAPY DISCHARGE SUMMARY  Visits from Start of Care: 9  Current functional level related to goals / functional outcomes: See below   Remaining deficits: See below   Education / Equipment: Patient was instructed in Prosthetic care & HEP / exercise program which she appears to understand.   Patient agrees to discharge. Patient goals were partially met. Patient is being discharged due to meeting the stated rehab goals. & pt satisfied with current level of function.     PT End of Session - 10/13/21 1354     Visit Number 9    Number of Visits 11    Date for PT Re-Evaluation 10/14/21    Authorization Type [employer]    Authorization Time Period 50% COINSURANCE AFTER DED HAS BEEN MET, DED $3600, OOP $18000, NO VISIT LIMIT/REF#[id number]    PT Start Time 1350    PT Stop Time 1430    PT Time Calculation (min) 40 min    Equipment Utilized During Treatment Gait belt    Activity Tolerance Patient tolerated treatment well    Behavior During Therapy WFL for tasks assessed/performed                   Past Medical History:  Diagnosis Date   Hyperlipidemia    Hypertension    Obesity    Type 2 diabetes mellitus ([place])    Past Surgical History:  Procedure Laterality Date   AMPUTATION Left 02/19/2021   Procedure: AMPUTATION BELOW KNEE;  Surgeon: Newt Minion, MD;  Location: [facility];  Service: Orthopedics;  Laterality: Left;   BREAST CYST EXCISION Right 05/2018   four cysts removed on right breast/axilla area   TEE WITHOUT CARDIOVERSION N/A 02/23/2021   Procedure: TRANSESOPHAGEAL ECHOCARDIOGRAM (TEE);  Surgeon: Pixie Casino, MD;  Location: [facility] ENDOSCOPY;  Service: Cardiovascular;  Laterality: N/A;   Patient Active Problem List    Diagnosis Date Noted   Dyslipidemia    Hypoalbuminemia due to protein-calorie malnutrition ([place])    Diabetic peripheral neuropathy ([place])    Acute blood loss anemia    S/P BKA (below knee amputation) unilateral, left ([place]) 03/05/2021   Left below-knee amputee ([place]) 03/05/2021   Adjustment disorder with mixed anxiety and depressed mood    Pressure injury of skin 02/28/2021   Discitis of lumbar region    Subacute osteomyelitis, left ankle and foot ([place])    Diabetes mellitus ([place])    Left leg cellulitis    Bacteremia due to Staphylococcus aureus 02/17/2021   Morbid obesity with BMI of 45.0-49.9, adult ([place]) 01/05/2012    REFERRING DIAG: F02.774 (ICD-10-CM) - History of left below knee amputation  ONSET DATE: 08/16/2021 prosthesis delivery  THERAPY DIAG:  Other abnormalities of gait and mobility  Unsteadiness on feet  Muscle weakness (generalized)  PERTINENT HISTORY: DM2, obesity, depression, HLD  PRECAUTIONS: Fall  SUBJECTIVE:  She went back to work 6/22 for 4 hrs /day for 1 week, now 6 hrs/day with plans to go 8hrs/day on 7/12.  She has been floating in floral & other areas.  She take brief seated rests as needed. She has not found any functional limits except endurance of walking & standing.   PAIN:  Are you having pain?   No  OBJECTIVE:    POSTURE: 08/17/2021 weight shift right  LE ROM:   Active ROM Right 08/17/2021 Left 08/17/2021 Right 10/13/2021 Left  10/13/2021  Hip flexion [facility] [employer] [facility] WFL  Hip extension Standing -10* Standing  -10* WFL WFL  Hip abduction [facility]  Hip adduction        Hip internal rotation        Hip external rotation        Knee flexion [facility] WFL  Knee extension [facility] WFL  Ankle dorsiflexion [facility]      Ankle plantarflexion        Ankle inversion        Ankle eversion         (Blank rows = not tested) MMT:   MMT Right 08/17/2021 Left 08/17/2021 Right 10/13/2021 Left 10/13/2021  Hip flexion [facility] WFL  Hip extension 4/5  4/5 WFL WFL  Hip abduction 4/5 4/5 [facility] WFL  Hip adduction        Hip internal rotation        Hip external rotation        Knee flexion 4/5 4/5 [facility] WFL  Knee extension 5/5 4/5 WFL 4/5  Ankle dorsiflexion        Ankle plantarflexion        Ankle inversion        Ankle eversion        (Blank rows = not tested)   TRANSFERS: 10/13/2021 modified independent requires use of UEs but can arise from chairs without armrests and controlled stand to sit.   08/17/2021 Sit to stand: SBA and requires UE assist Stand to sit: SBA   GAIT: 10/13/2021 Pt ambulates >500' with prosthesis only independently.  She negotiates ramps safely with prosthesis only. She neg curbs with prosthesis only with supervision.  She verbalizes understanding how to work on Science writer. She neg stairs with single rail with step-to pattern modified independent and requires supervision for alternating pattern with single rail. She neg alternating pattern with 2 rails modified independent.   LE weakness limits her ability to neg stairs.   08/17/2021 Gait pattern: step through pattern, decreased step length- Right, decreased stance time- Left, decreased hip/knee flexion- Left, circumduction- Left, antalgic, and abducted- Left Distance walked: 150' Assistive device utilized: Environmental consultant - 2 wheeled and TTA prosthesis Level of assistance: SBA Gait velocity: 2.57 ft/sec Comments: decreased stance time LLE, decreased step length RLE, flexed trunk, abducted LLE.   FUNCTIONAL TESTs:  10/13/2021 Berg Balance 51/56 Functional Gait Assessment 25/30  [facility] PT Assessment - 10/13/21 1350       Standardized Balance Assessment   Standardized Balance Assessment Berg Balance Test      Berg Balance Test   Sit to Stand Able to stand  independently using hands    Standing Unsupported Able to stand safely 2 minutes    Sitting with Back Unsupported but Feet Supported on Floor or Stool Able to sit safely and securely 2 minutes    Stand to Sit Sits safely with  minimal use of hands    Transfers Able to transfer safely, minor use of hands    Standing Unsupported with Eyes Closed Able to stand 10 seconds safely    Standing Unsupported with Feet Together Able to place feet together independently and stand 1 minute safely    From Standing, Reach Forward with Outstretched Arm Can reach confidently >25 cm (10")    From Standing Position, Pick up Object from Floor Able to pick up shoe safely and easily  From Standing Position, Turn to Look Behind Over each Shoulder Looks behind from both sides and weight shifts well    Turn 360 Degrees Able to turn 360 degrees safely in 4 seconds or less    Standing Unsupported, Alternately Place Feet on Step/Stool Able to stand independently and safely and complete 8 steps in 20 seconds    Standing Unsupported, One Foot in Front Able to plae foot ahead of the other independently and hold 30 seconds    Standing on One Leg Tries to lift leg/unable to hold 3 seconds but remains standing independently    Total Score 51    Berg comment: BERG  < 36 high risk for falls (close to 100%) 46-51 moderate (>50%)   37-45 significant (>80%) 52-55 lower (> 25%)      Functional Gait  Assessment   Gait assessed  Yes    Gait Level Surface Walks 20 ft in less than 5.5 sec, no assistive devices, good speed, no evidence for imbalance, normal gait pattern, deviates no more than 6 in outside of the 12 in walkway width.    Change in Gait Speed Able to smoothly change walking speed without loss of balance or gait deviation. Deviate no more than 6 in outside of the 12 in walkway width.    Gait with Horizontal Head Turns Performs head turns smoothly with no change in gait. Deviates no more than 6 in outside 12 in walkway width    Gait with Vertical Head Turns Performs head turns with no change in gait. Deviates no more than 6 in outside 12 in walkway width.    Gait and Pivot Turn Pivot turns safely within 3 sec and stops quickly with no loss of  balance.    Step Over Obstacle Is able to step over one shoe box (4.5 in total height) without changing gait speed. No evidence of imbalance.    Gait with Narrow Base of Support Ambulates 7-9 steps.    Gait with Eyes Closed Walks 20 ft, no assistive devices, good speed, no evidence of imbalance, normal gait pattern, deviates no more than 6 in outside 12 in walkway width. Ambulates 20 ft in less than 7 sec.    Ambulating Backwards Walks 20 ft, uses assistive device, slower speed, mild gait deviations, deviates 6-10 in outside 12 in walkway width.    Steps Two feet to a stair, must use rail.    Total Score 25    FGA comment: Max score 30  Interpretation: 25-28 = low risk fall   19-24 = medium risk fall              Berg Balance Scale: 08/17/2021 29/56   [facility] PT Assessment - 08/17/21 1250                Standardized Balance Assessment    Standardized Balance Assessment Berg Balance Test          Berg Balance Test    Sit to Stand Able to stand  independently using hands     Standing Unsupported Able to stand 2 minutes with supervision     Sitting with Back Unsupported but Feet Supported on Floor or Stool Able to sit safely and securely 2 minutes     Stand to Sit Controls descent by using hands     Transfers Able to transfer safely, definite need of hands     Standing Unsupported with Eyes Closed Able to stand 10 seconds with supervision  Standing Unsupported with Feet Together Able to place feet together independently and stand for 1 minute with supervision     From Standing, Reach Forward with Outstretched Arm Reaches forward but needs supervision     From Standing Position, Pick up Object from [place] to pick up shoe, needs supervision     From Standing Position, Turn to Look Behind Over each Shoulder Needs supervision when turning     Turn 360 Degrees Needs assistance while turning     Standing Unsupported, Alternately Place Feet on Step/Stool Needs assistance to keep from  falling or unable to try     Standing Unsupported, One Foot in [place] help to step but can hold 15 seconds     Standing on One Leg Tries to lift leg/unable to hold 3 seconds but remains standing independently     Total Score 29     Berg comment: BERG  < 36 high risk for falls (close to 100%) 46-51 moderate (>50%)   37-45 significant (>80%) 52-55 lower (> 25%)                     CURRENT PROSTHETIC WEAR ASSESSMENT: 10/13/2021 Patient verbalizes and demonstrates understanding of prosthetic care. She reports wear most of awake hours.   08/17/2021 Patient is dependent with: skin check, residual limb care, prosthetic cleaning, ply sock cleaning, correct ply sock adjustment, proper wear schedule/adjustment, and proper weight-bearing schedule/adjustment Donning prosthesis: SBA Doffing prosthesis: SBA Prosthetic wear tolerance: 1.5 hours/day for 1 day since delivery Prosthetic weight bearing tolerance: 5 minutes with partial weight on prosthesis with no c/o discomfort. Edema: pitting Residual limb condition: 5 scabs on incision with no signs of infection & no palpation of internal sutures working out, good hair growth,  normal color, temperature & moisture, cylindrical shape. Prosthetic description: silicon liner with pin lock, total contact socket, dynamic response foot with rotation     TODAY'S TREATMENT:  10/13/2021 Patient demonstrates proper lifting & carrying, pushing & pulling with TTA prosthesis.  See objective data above. Pt recommended use of antiperspirant and changing under liner every 2 hours when working for sweat management.  Pt verbalized understanding.   09/29/2021 Prosthetic Training with TTA prosthesis: Patient's wound has healed completely.  PT recommended continuing Vivewear underliner for sweat.  PT demo & verbal cues on measuring her limb for underliner size when she purchases more.  PT recommended wrapping over night shrinker to minimize volume changes within her  day which is frustrating. Pt verbalized understanding PT instructed in adjusting ply socks & drying sweat at work with long pants. Pt verbalized understanding. Pt demo & instructed in stepping on & off escalator or moving walk way like airports. Pt performed to/from step to moving treadmill belt.  Pt verbalizes better understanding.    09/13/2021 Prosthetic Training: Wound is open 35mm long with less depth noted. PT issued XXL & XL vivewear shrinker. PT demo & verbal cues on wrapping over shrinker for night time edema control. Pt verbalized understanding. PT recommended continue wear all awake hours with underliner wear and checking q4hrs for dampness. Pt verbalized understanding.  Walk looking around & maintain path / pace.  Look right/ left, up/down & diagonals. Tactile cues for maintaining path.  Walk 3 steps eyes open & eyes closed to enable improved balance if needs to walk in dark.  Step over obstacles. Step right leg close to obstacle & prosthesis over obstacle.  Neuromuscular Re-ed: Walking backwards. Tandem gait near counter forward & backward.  Blue band in door. Facing door, Row with both arms. And lock hands to sides walk back & forward. Blue band in door. Back to door, reach forward with both arms. And lock hands to sides walk back & forward.     ASSESSMENT:   CLINICAL IMPRESSION: Patient met or partially met all LTGs.  She is functioning at community level with her TTA prosthesis. She has returned to work and reports able to perform all tasks.  She is working to build her endurance at work.  She appears to have understanding for prosthetic care & use.    OBJECTIVE IMPAIRMENTS Abnormal gait, decreased activity tolerance, decreased balance, decreased endurance, decreased knowledge of use of DME, decreased mobility, increased edema, and prosthetic dependency .    ACTIVITY LIMITATIONS community activity and occupation.    PERSONAL FACTORS Fitness, Time since onset of  injury/illness/exacerbation, and 3+ comorbidities: see PMH  are also affecting patient's functional outcome.    REHAB POTENTIAL: Good   CLINICAL DECISION MAKING: Stable/uncomplicated   EVALUATION COMPLEXITY: Low     GOALS: Goals reviewed with patient? Yes   SHORT TERM GOALS: Target date: 09/17/2021   Patient donnes prosthesis modified independent & verbalizes proper cleaning. Goal status:MET 2.  Patient tolerates prosthesis >14 hrs total /day without skin issues or limb pain. Goal status:MET   3.  Patient able pick up items from floor and reach 10" without UE support safely. BGoal status:MET   4. Patient ambulates 200' with cane & prosthesis with supervision. Goal status:MET   5. Patient negotiates ramps & curbs with cane & prosthesis with supervision. Goal status:MET     LONG TERM GOALS: Target date: 10/14/2021   Patient demonstrates & verbalized understanding of prosthetic care to enable safe utilization of prosthesis. Baseline: SEE OBJECTIVE DATA Goal status: met 10/13/21   Patient tolerates prosthesis wear >90% of awake hours without skin or limb pain issues. Baseline: SEE OBJECTIVE DATA Goal status: met 10/13/21   Functional Gait Assessment <22/30 to indicate lower fall risk Baseline: SEE OBJECTIVE DATA Goal status: met 10/13/21   Patient ambulates >500' with prosthesis only independently Baseline: SEE OBJECTIVE DATA Goal status:met 10/13/21   Patient negotiates ramps, curbs & stairs with single rail with prosthesis only independently. Baseline: SEE OBJECTIVE DATA Goal status: partially met 10/13/21   Patient demonstrates & verbalizes lifting, carrying, pushing, pulling with prosthesis only safely. Baseline: SEE OBJECTIVE DATA Goal status: met 10/13/21   Merrilee Jansky Balance >/= 52/56 to indicate low fall risk. Baseline: SEE OBJECTIVE DATA Goal status: partially met 10/13/21   PLAN: PT FREQUENCY: 1-2x/week   PT DURATION: 8 weeks   PLANNED INTERVENTIONS: Therapeutic  exercises, Therapeutic activity, Neuromuscular re-education, Balance training, Gait training, Patient/Family education, Stair training, Vestibular training, Prosthetic training, Taping, and Manual therapy   PLAN FOR NEXT SESSION:   discharge. PT  Jamey Reas, PT, DPT 10/13/2021, 8:05 PM

## 2021-10-16 ENCOUNTER — Other Ambulatory Visit: Payer: Self-pay

## 2021-10-16 ENCOUNTER — Emergency Department (HOSPITAL_COMMUNITY): Payer: Managed Care, Other (non HMO)

## 2021-10-16 ENCOUNTER — Encounter (HOSPITAL_COMMUNITY): Payer: Self-pay

## 2021-10-16 ENCOUNTER — Inpatient Hospital Stay (HOSPITAL_COMMUNITY)
Admission: EM | Admit: 2021-10-16 | Discharge: 2021-10-19 | DRG: 872 | Disposition: A | Discharge status: Home / Self Care | Payer: Managed Care, Other (non HMO) | Attending: Internal Medicine | Admitting: Internal Medicine

## 2021-10-16 DIAGNOSIS — Z7984 Long term (current) use of oral hypoglycemic drugs: Secondary | ICD-10-CM

## 2021-10-16 DIAGNOSIS — A419 Sepsis, unspecified organism: Secondary | ICD-10-CM

## 2021-10-16 DIAGNOSIS — A4101 Sepsis due to Methicillin susceptible Staphylococcus aureus: Secondary | ICD-10-CM | POA: Diagnosis not present

## 2021-10-16 DIAGNOSIS — L0291 Cutaneous abscess, unspecified: Secondary | ICD-10-CM | POA: Diagnosis present

## 2021-10-16 DIAGNOSIS — Z793 Long term (current) use of hormonal contraceptives: Secondary | ICD-10-CM

## 2021-10-16 DIAGNOSIS — L03213 Periorbital cellulitis: Secondary | ICD-10-CM | POA: Diagnosis present

## 2021-10-16 DIAGNOSIS — E1165 Type 2 diabetes mellitus with hyperglycemia: Secondary | ICD-10-CM | POA: Diagnosis present

## 2021-10-16 DIAGNOSIS — E11628 Type 2 diabetes mellitus with other skin complications: Secondary | ICD-10-CM | POA: Diagnosis present

## 2021-10-16 DIAGNOSIS — L02811 Cutaneous abscess of head [any part, except face]: Secondary | ICD-10-CM | POA: Diagnosis present

## 2021-10-16 DIAGNOSIS — Z79899 Other long term (current) drug therapy: Secondary | ICD-10-CM

## 2021-10-16 DIAGNOSIS — Z882 Allergy status to sulfonamides status: Secondary | ICD-10-CM

## 2021-10-16 DIAGNOSIS — E785 Hyperlipidemia, unspecified: Secondary | ICD-10-CM | POA: Diagnosis present

## 2021-10-16 DIAGNOSIS — L03221 Cellulitis of neck: Secondary | ICD-10-CM | POA: Diagnosis present

## 2021-10-16 DIAGNOSIS — L738 Other specified follicular disorders: Secondary | ICD-10-CM | POA: Diagnosis present

## 2021-10-16 DIAGNOSIS — Z794 Long term (current) use of insulin: Secondary | ICD-10-CM

## 2021-10-16 DIAGNOSIS — T383X6A Underdosing of insulin and oral hypoglycemic [antidiabetic] drugs, initial encounter: Secondary | ICD-10-CM | POA: Diagnosis present

## 2021-10-16 DIAGNOSIS — Z89512 Acquired absence of left leg below knee: Secondary | ICD-10-CM

## 2021-10-16 DIAGNOSIS — Z8619 Personal history of other infectious and parasitic diseases: Secondary | ICD-10-CM

## 2021-10-16 DIAGNOSIS — Z888 Allergy status to other drugs, medicaments and biological substances status: Secondary | ICD-10-CM

## 2021-10-16 DIAGNOSIS — H6012 Cellulitis of left external ear: Secondary | ICD-10-CM | POA: Diagnosis present

## 2021-10-16 DIAGNOSIS — I10 Essential (primary) hypertension: Secondary | ICD-10-CM | POA: Diagnosis present

## 2021-10-16 DIAGNOSIS — L03211 Cellulitis of face: Secondary | ICD-10-CM | POA: Diagnosis present

## 2021-10-16 DIAGNOSIS — E119 Type 2 diabetes mellitus without complications: Secondary | ICD-10-CM

## 2021-10-16 DIAGNOSIS — Z6841 Body Mass Index (BMI) 40.0 and over, adult: Secondary | ICD-10-CM

## 2021-10-16 LAB — CBC WITH DIFFERENTIAL/PLATELET
Abs Immature Granulocytes: 0.05 10*3/uL (ref 0.00–0.07)
Basophils Absolute: 0 10*3/uL (ref 0.0–0.1)
Basophils Relative: 0 %
Eosinophils Absolute: 0.1 10*3/uL (ref 0.0–0.5)
Eosinophils Relative: 1 %
HCT: 37.2 % (ref 36.0–46.0)
Hemoglobin: 12.4 g/dL (ref 12.0–15.0)
Immature Granulocytes: 1 %
Lymphocytes Relative: 11 %
Lymphs Abs: 1.3 10*3/uL (ref 0.7–4.0)
MCH: 28.5 pg (ref 26.0–34.0)
MCHC: 33.3 g/dL (ref 30.0–36.0)
MCV: 85.5 fL (ref 80.0–100.0)
Monocytes Absolute: 0.7 10*3/uL (ref 0.1–1.0)
Monocytes Relative: 7 %
Neutro Abs: 9 10*3/uL — ABNORMAL HIGH (ref 1.7–7.7)
Neutrophils Relative %: 80 %
Platelets: 245 10*3/uL (ref 150–400)
RBC: 4.35 MIL/uL (ref 3.87–5.11)
RDW: 11.6 % (ref 11.5–15.5)
WBC: 11.1 10*3/uL — ABNORMAL HIGH (ref 4.0–10.5)
nRBC: 0 % (ref 0.0–0.2)

## 2021-10-16 LAB — APTT: aPTT: 27 seconds (ref 24–36)

## 2021-10-16 LAB — COMPREHENSIVE METABOLIC PANEL
ALT: 17 U/L (ref 0–44)
AST: 12 U/L — ABNORMAL LOW (ref 15–41)
Albumin: 3.4 g/dL — ABNORMAL LOW (ref 3.5–5.0)
Alkaline Phosphatase: 113 U/L (ref 38–126)
Anion gap: 12 (ref 5–15)
BUN: 18 mg/dL (ref 6–20)
CO2: 22 mmol/L (ref 22–32)
Calcium: 8.7 mg/dL — ABNORMAL LOW (ref 8.9–10.3)
Chloride: 104 mmol/L (ref 98–111)
Creatinine, Ser: 0.48 mg/dL (ref 0.44–1.00)
GFR, Estimated: >60 mL/min (ref >60)
Glucose, Bld: 347 mg/dL — ABNORMAL HIGH (ref 70–99)
Potassium: 4.2 mmol/L (ref 3.5–5.1)
Sodium: 138 mmol/L (ref 135–145)
Total Bilirubin: 0.5 mg/dL (ref 0.3–1.2)
Total Protein: 6.9 g/dL (ref 6.5–8.1)

## 2021-10-16 LAB — LACTIC ACID, PLASMA
Lactic Acid, Venous: 1.5 mmol/L (ref 0.5–1.9)
Lactic Acid, Venous: 1.2 mmol/L (ref 0.5–1.9)

## 2021-10-16 LAB — PROTIME-INR
INR: 1 (ref 0.8–1.2)
Prothrombin Time: 13.5 seconds (ref 11.4–15.2)

## 2021-10-16 LAB — CBG MONITORING, ED: Glucose-Capillary: 259 mg/dL — ABNORMAL HIGH (ref 70–99)

## 2021-10-16 LAB — I-STAT BETA HCG BLOOD, ED (MC, WL, AP ONLY): I-stat hCG, quantitative: <5 m[IU]/mL (ref <5)

## 2021-10-16 MED ORDER — OXYCODONE HCL 5 MG PO TABS
5.0000 mg | ORAL_TABLET | ORAL | Status: DC | PRN
  Administered 2021-10-17 – 2021-10-18 (×7): 5 mg via ORAL
  Filled 2021-10-16 (×7): qty 1

## 2021-10-16 MED ORDER — SODIUM CHLORIDE 0.9 % IV SOLN: 1.0000 g | Freq: Once | INTRAVENOUS | Status: DC

## 2021-10-16 MED ORDER — IOHEXOL 300 MG/ML  SOLN
100.0000 mL | Freq: Once | INTRAMUSCULAR | Status: AC | PRN
Start: 2021-10-16 — End: 2021-10-16
  Administered 2021-10-16: 100 mL via INTRAVENOUS

## 2021-10-16 MED ORDER — LIDOCAINE-EPINEPHRINE (PF) 2 %-1:200000 IJ SOLN
INTRAMUSCULAR | Status: AC
  Administered 2021-10-16: 20 mL
  Filled 2021-10-16: qty 20

## 2021-10-16 MED ORDER — HYDROMORPHONE HCL 1 MG/ML IJ SOLN
1.0000 mg | Freq: Once | INTRAMUSCULAR | Status: AC
  Administered 2021-10-16: 1 mg via INTRAVENOUS
  Filled 2021-10-16: qty 1

## 2021-10-16 MED ORDER — PRAVASTATIN SODIUM 40 MG PO TABS
40.0000 mg | ORAL_TABLET | Freq: Every day | ORAL | Status: DC
  Administered 2021-10-17 – 2021-10-19 (×3): 40 mg via ORAL
  Filled 2021-10-16 (×3): qty 1

## 2021-10-16 MED ORDER — INSULIN DETEMIR 100 UNIT/ML ~~LOC~~ SOLN
30.0000 [IU] | Freq: Two times a day (BID) | SUBCUTANEOUS | Status: DC
Start: 2021-10-16 — End: 2021-10-19
  Administered 2021-10-17 – 2021-10-19 (×6): 30 [IU] via SUBCUTANEOUS
  Filled 2021-10-16 (×8): qty 0.3

## 2021-10-16 MED ORDER — ACETAMINOPHEN 325 MG PO TABS
650.0000 mg | ORAL_TABLET | Freq: Four times a day (QID) | ORAL | Status: DC | PRN
  Administered 2021-10-17 – 2021-10-18 (×3): 650 mg via ORAL
  Administered 2021-10-18: 325 mg via ORAL
  Filled 2021-10-16 (×4): qty 2

## 2021-10-16 MED ORDER — SODIUM CHLORIDE 0.9 % IV SOLN
2.0000 g | Freq: Once | INTRAVENOUS | Status: AC
  Administered 2021-10-16: 2 g via INTRAVENOUS
  Filled 2021-10-16: qty 20

## 2021-10-16 MED ORDER — VANCOMYCIN HCL 2000 MG/400ML IV SOLN
2000.0000 mg | Freq: Once | INTRAVENOUS | Status: AC
  Administered 2021-10-16: 2000 mg via INTRAVENOUS
  Filled 2021-10-16: qty 400

## 2021-10-16 MED ORDER — ACETAMINOPHEN 650 MG RE SUPP: 650.0000 mg | Freq: Four times a day (QID) | RECTAL | Status: DC | PRN

## 2021-10-16 MED ORDER — LACTATED RINGERS IV SOLN
INTRAVENOUS | Status: DC
Start: 2021-10-16 — End: 2021-10-17

## 2021-10-16 MED ORDER — INSULIN ASPART 100 UNIT/ML IJ SOLN
0.0000 [IU] | Freq: Every day | INTRAMUSCULAR | Status: DC
  Administered 2021-10-16: 3 [IU] via SUBCUTANEOUS
  Administered 2021-10-17 – 2021-10-18 (×2): 2 [IU] via SUBCUTANEOUS

## 2021-10-16 MED ORDER — ONDANSETRON HCL 4 MG/2ML IJ SOLN
4.0000 mg | Freq: Once | INTRAMUSCULAR | Status: AC
Start: 2021-10-16 — End: 2021-10-16
  Administered 2021-10-16: 4 mg via INTRAVENOUS
  Filled 2021-10-16: qty 2

## 2021-10-16 MED ORDER — CITALOPRAM HYDROBROMIDE 40 MG PO TABS
40.0000 mg | ORAL_TABLET | Freq: Every day | ORAL | Status: DC
  Administered 2021-10-16 – 2021-10-18 (×3): 40 mg via ORAL
  Filled 2021-10-16: qty 1
  Filled 2021-10-16: qty 4
  Filled 2021-10-16: qty 1

## 2021-10-16 MED ORDER — VANCOMYCIN HCL 750 MG/150ML IV SOLN
750.0000 mg | Freq: Three times a day (TID) | INTRAVENOUS | Status: DC
  Administered 2021-10-17 (×3): 750 mg via INTRAVENOUS
  Filled 2021-10-16 (×4): qty 150

## 2021-10-16 MED ORDER — POLYETHYLENE GLYCOL 3350 17 G PO PACK: 17.0000 g | PACK | Freq: Every day | ORAL | Status: DC | PRN

## 2021-10-16 MED ORDER — LACTATED RINGERS IV BOLUS (SEPSIS)
1000.0000 mL | Freq: Once | INTRAVENOUS | Status: AC
  Administered 2021-10-16: 1000 mL via INTRAVENOUS

## 2021-10-16 MED ORDER — ENOXAPARIN SODIUM 40 MG/0.4ML IJ SOSY
40.0000 mg | PREFILLED_SYRINGE | INTRAMUSCULAR | Status: DC
  Administered 2021-10-17 – 2021-10-19 (×3): 40 mg via SUBCUTANEOUS
  Filled 2021-10-16 (×3): qty 0.4

## 2021-10-16 MED ORDER — HYDROCODONE-ACETAMINOPHEN 5-325 MG PO TABS
1.0000 | ORAL_TABLET | Freq: Once | ORAL | Status: AC
  Administered 2021-10-16: 1 via ORAL
  Filled 2021-10-16: qty 1

## 2021-10-16 MED ORDER — INSULIN ASPART 100 UNIT/ML IJ SOLN
0.0000 [IU] | Freq: Three times a day (TID) | INTRAMUSCULAR | Status: DC
  Administered 2021-10-17: 11 [IU] via SUBCUTANEOUS
  Administered 2021-10-17: 20 [IU] via SUBCUTANEOUS
  Administered 2021-10-17: 11 [IU] via SUBCUTANEOUS
  Administered 2021-10-18: 15 [IU] via SUBCUTANEOUS
  Administered 2021-10-18: 11 [IU] via SUBCUTANEOUS
  Administered 2021-10-18: 4 [IU] via SUBCUTANEOUS
  Administered 2021-10-19: 2 [IU] via SUBCUTANEOUS
  Administered 2021-10-19: 11 [IU] via SUBCUTANEOUS

## 2021-10-16 MED ORDER — OXYCODONE HCL 5 MG PO TABS: 5.0000 mg | ORAL_TABLET | Freq: Four times a day (QID) | ORAL | Status: DC | PRN

## 2021-10-16 NOTE — ED Triage Notes (Signed)
Patient complains of severe head pain and left ear pain x 2 days. Swelling with redness noted to face, scalp and ear, has been on antibiotics x 2 with no relief to abscess to scalp

## 2021-10-16 NOTE — Hospital Course (Addendum)
She is feeling much better, has already packed to go home today. Not needing pain medicines anymore. A couple of her boils have popped but still present. Not currently sexually active, she gets injections for contraceptive.    Lori Schmidt     Surgery -left BKA -cyst Right underarm  Meds -Januvia -Insulin: 40 units BID. No meal time  -B12 -Mag -Stop metformin when a few weeks ago -Losartan -pravastatin -? Lyrica -No farxiga -Celexa -? Victoza. Waiting for insurance  Fam -dad- passed away heart att -mom-heart  Social -no smoking, alcohol. 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  -lives at home w 2 children

## 2021-10-16 NOTE — ED Provider Triage Note (Signed)
Emergency Medicine Provider Triage Evaluation Note  Lori Schmidt , a 46 y.o. female  was evaluated in triage.  Pt complains of left ear and face pain.  Patient has been having pain on the side and was diagnosed with cellulitis about 2 weeks ago.  She has completed 2 rounds of outpatient antibiotics.  She now has an abscess to the posterior scalp and an open sore on the back of her neck on the right.  She has severe pain in the left ear and face.  She has a history of diabetes and is status post below-knee amputation on the left and has a history of recurrent skin infections.  Patient is in severe pain.  Review of Systems  Positive: Skin infection Negative: Visual disturbance  Physical Exam  BP (!) 172/97 (BP Location: Right Arm)   Pulse (!) 110   Temp 98.9 F (37.2 C) (Oral)   Resp 20   SpO2 100%  Gen:   Awake, no distress   Resp:  Normal effort  MSK:   Moves extremities without difficulty  Other:  Abscess of the posterior scalp, cellulitis of the left ear, face and left side of the neck  Medical Decision Making  Medically screening exam initiated at 1:23 PM.  Appropriate orders placed.  Harvey Lingo was informed that the remainder of the evaluation will be completed by another provider, this initial triage assessment does not replace that evaluation, and the importance of remaining in the ED until their evaluation is complete.  Patient is diabetic, history of recurrent skin infections.  She appears to have abscess and facial/scalp cellulitis.  Work-up initiated   Arthor Captain, PA-C 10/16/21 1324

## 2021-10-16 NOTE — H&P (Incomplete)
Date: 10/17/2021               Patient Name:  Lori Schmidt MRN: [id number]  DOB: 1975/08/21 Age / Sex: 46 y.o., female   PCP: Primus Bravo, NP         Medical Service: Internal Medicine Teaching Service         Attending Physician: Dr. Inez Catalina, MD    First Contact: Dr. Cliffton Asters Pager: [phone]  Second Contact: Dr. Austin Miles Pager: [phone]       After Hours (After 5p/  First Contact Pager: [phone]  weekends / holidays): Second Contact Pager: [phone]   Chief Complaint: Scalp abscess  History of Present Illness: Lori Schmidt is a 46 year old female PMH type 2 diabetes with insulin use, 8 months s/p left BKA, osteomyelitis, hypertension, hyperlipidemia presents to the ED for worsening scalp abscesses and left facial swelling. She reports noticing small bumps around the hair follicles on her left scalp that started in mid June. Notes history of having similar episodes that would resolve on its own. The bumps persisted and grew which prompted her to visit her PCP. Prescribed 1 week of Cipro with no improvement. She tried [employer] with no relief.  The abscesses worsen and in the past 5 days she noticed swelling on the left side of her face. She reports facial fullness and pain in her left ear down to jaw. Yesterday, she then went to urgent care and was given 1 dose of IV ceftriaxone. Denies any vision changes or eye pain. Denies fever, chills, headache, nausea, or vomiting. She did have mild neck stiffness.  Reports having boils in between her legs along the pelvic folds. Denies any current infection there. History of boils under her arms with surgical history of cyst removal at her right underarm.  She was admitted to our service in November 2022 for [name] complicated by osteomyelitis and MSSA bacteremia requiring a left BKA due to poor wound healing.   ED course: Patient was afebrile, tachycardic and hypertensive.  Labs significant for leukocytosis of 11.1. I&D  was performed for abscess on the posterior head. Vancomycin and Rocephin started.    Meds:  -Januvia 100 mg daily -Levemir 40 units twice daily with no mealtime -Losartan 25 mg daily -Pravastatin 40 mg daily -Celexa 40 mg daily -Cyanocobalamin 1000 mcg daily -Magnesium oxide 400 mg daily  -She stopped metformin a few weeks ago, not taking [place] or Lyrica.  No outpatient medications have been marked as taking for the 10/16/21 encounter [facility] Encounter).     Allergies: Allergies as of 10/16/2021 - Review Complete 10/16/2021  Allergen Reaction Noted   Sulfa antibiotics Other (See Comments) 01/05/2012   Lisinopril Cough 11/13/2014   Past Medical History:  Diagnosis Date   Hyperlipidemia    Hypertension    Obesity    Type 2 diabetes mellitus (HCC)     Family History: Patient reports father passed away from heart attack and mother has cardiovascular disease.  Social History: Denies tobacco use, alcohol use and illicit drug use.  She works as a Museum/gallery curator at [employer].  She is currently living with her 2 children.  Review of Systems: A complete ROS was negative except as per HPI.   Physical Exam: Blood pressure 133/85, pulse (!) 111, temperature 97.9 F (36.6 C), temperature source Oral, resp. rate 17, SpO2 95 %.  Physical Exam HENT:     Head: Left periorbital erythema present.     Comments: Several  abscesses present on left side and posterior head with surrounding erythema.     Left Ear: Tympanic membrane, ear canal and external ear normal. Tenderness present.  Eyes:     General:        Right eye: No discharge.        Left eye: No discharge.     Extraocular Movements: Extraocular movements intact.  Cardiovascular:     Rate and Rhythm: Regular rhythm. Tachycardia present.  Pulmonary:     Effort: Pulmonary effort is normal.     Breath sounds: Normal breath sounds.  Abdominal:     General: Abdomen is flat. Bowel sounds are normal.     Palpations: Abdomen  is soft.     Tenderness: There is no abdominal tenderness. There is no guarding or rebound.  Musculoskeletal:     Cervical back: Normal range of motion. No rigidity or tenderness.  Neurological:     Mental Status: She is alert.  Psychiatric:        Mood and Affect: Mood normal.        Behavior: Behavior normal.             EKG: personally reviewed my interpretation is sinus tachycardia  CXR: personally reviewed my interpretation is no acute findings    Assessment & Plan by Problem: Principal Problem:   Abscess of skin and subcutaneous tissue Active Problems:   Diabetes mellitus (HCC)   S/P BKA (below knee amputation) unilateral, left (HCC)  Purulent Folliculitis Lori Schmidt 46 year old female PMH type 2 diabetes with insulin use, left BKA, hypertension, hyperlipidemia presents to the ED with worsening scalp abscesses and left facial swelling. Failed outpatient antibiotics and requires inpatient admission for IV antibiotics. CT head showed multiple subcutaneous abscesses, negative for brain abscess or intracranial abnormality. CT maxillofacial showed subcutaneous induration with the most significant being superior to the left ear. No findings suggesting osteomyelitis or meningitis. No evidence of systemic infection.   Plan: -continue IV Vancomycin -Abscess superior to left ear most concerning, may require I&D  -transition to po antibiotics when clinically stable -blood cultures pending  Diabetes mellitus (HCC) Patient's last A1c is 8.9.  Concern for severe infection due to her diabetes diagnosis. Plan: -Levemir 30 units twice daily -SSI  Hypertension BP was low-normotensive in ED.  Plan: -hold losartan   Hyperlipidemia Plan: -continue Pravastatin  S/P Left BKA Patient is doing well with prosthesis.  Code Status: FULL DVT Prophylaxis: Lovenox Fluids: none Diet: modified carb  Dispo: Admit patient to Observation with expected length of stay less than 2  midnights.  SignedRana Snare, DO 10/17/2021, 1:00 AM  Pager: [phone] After 5pm on weekdays and 1pm on weekends: On Call pager: [phone]

## 2021-10-16 NOTE — Progress Notes (Signed)
Elink following for sepsis protocol. 

## 2021-10-16 NOTE — ED Provider Notes (Signed)
[facility] EMERGENCY DEPARTMENT Provider Note   CSN: [id number] Arrival date & time: 10/16/21  1240     History  No chief complaint on file.   Lori Schmidt is a 46 y.o. female.  HPI  46 year old female with a history of left BKA, diabetes, morbid obesity, osteomyelitis, bacteremia due to Staph aureus presenting to the emergency department with a chief complaint of worsening left facial cellulitis despite outpatient antibiotics.  The patient is 8 months status post left BKA.  The patient is that she has been on outpatient Augmentin for the past 2 weeks.  She has left scalp swelling and redness that has been now spreading to around her left eye.  She denies any pain with extraocular movements.  She denies any fevers.  She states that symptoms have been worsening despite outpatient antibiotics. She endorses severe pain in her face.  She has an abscess to the posterior scalp and an open sore on the back of her neck to the right.  She also endorses severe pain in the left ear and face.  She has a history of recurrent skin infections.  Home Medications Prior to Admission medications   Medication Sig Start Date End Date Taking? Authorizing Provider  acetaminophen (TYLENOL) 325 MG tablet Take 1-2 tablets (325-650 mg total) by mouth every 6 (six) hours as needed for mild pain (pain score 1-3 or temp > 100.5). 03/22/21   Angiulli, Mcarthur Rossetti, PA-C  ascorbic acid (VITAMIN C) 1000 MG tablet Take 1 tablet (1,000 mg total) by mouth daily. 03/22/21   Angiulli, Mcarthur Rossetti, PA-C  citalopram (CELEXA) 40 MG tablet Take 1 tablet (40 mg total) by mouth at bedtime. 04/09/21   Jones Bales, NP  cyanocobalamin 1000 MCG tablet Take 1/2 tablet (500 mcg total) by mouth daily. Patient not taking: Reported on 08/17/2021 03/22/21   Angiulli, Mcarthur Rossetti, PA-C  dapagliflozin propanediol (FARXIGA) 5 MG TABS tablet Take 1 tablet (5 mg total) by mouth daily before breakfast. Patient not taking:  Reported on 08/17/2021 03/22/21   Angiulli, Mcarthur Rossetti, PA-C  diclofenac Sodium (VOLTAREN) 1 % GEL Apply 2 g topically 4 (four) times daily. Patient not taking: Reported on 04/26/2021 03/22/21   Angiulli, Mcarthur Rossetti, PA-C  docusate sodium (COLACE) 100 MG capsule Take 1 capsule (100 mg total) by mouth daily. 03/22/21   Angiulli, Mcarthur Rossetti, PA-C  insulin detemir (LEVEMIR) 100 UNIT/ML FlexPen Inject 55 Units into the skin at bedtime. 03/22/21   Angiulli, Mcarthur Rossetti, PA-C  insulin lispro (HUMALOG) 100 UNIT/ML KwikPen Inject 8 Units into the skin 3 (three) times daily with meals. 03/22/21   Angiulli, Mcarthur Rossetti, PA-C  losartan (COZAAR) 25 MG tablet Take 1 tablet (25 mg total) by mouth at bedtime. 03/22/21 03/22/22  Angiulli, Mcarthur Rossetti, PA-C  magnesium oxide (MAG-OX) 400 MG tablet Take 1 tablet (400 mg total) by mouth daily. 03/22/21   Angiulli, Mcarthur Rossetti, PA-C  medroxyPROGESTERone Acetate (DEPO-PROVERA IM) Inject into the muscle every 3 (three) months. Patient not taking: Reported on 08/17/2021    [provider]  metFORMIN (GLUCOPHAGE) 1000 MG tablet Take 1 tablet (1,000 mg total) by mouth 2 (two) times daily with a meal. 03/22/21 04/22/21  Angiulli, Mcarthur Rossetti, PA-C  methocarbamol (ROBAXIN) 750 MG tablet Take 1 tablet (750 mg total) by mouth every 6 (six) hours. Patient not taking: Reported on 04/26/2021 04/09/21   Jones Bales, NP  ondansetron (ZOFRAN-ODT) 8 MG disintegrating tablet ondansetron 8 mg disintegrating tablet Patient not  taking: Reported on 08/17/2021    [provider]  pantoprazole (PROTONIX) 40 MG tablet Take 1 tablet (40 mg total) by mouth daily. Patient not taking: Reported on 08/17/2021 03/22/21   Angiulli, Mcarthur Rossetti, PA-C  pravastatin (PRAVACHOL) 40 MG tablet Take 1 tablet (40 mg total) by mouth daily. 03/22/21 04/26/21  Angiulli, Mcarthur Rossetti, PA-C  pregabalin (LYRICA) 100 MG capsule Take 1 capsule (100 mg) by mouth twice daily and 2 capsules (200 mg total) nightly Patient not taking:  Reported on 08/17/2021 04/09/21   Jones Bales, NP  sitaGLIPtin (JANUVIA) 100 MG tablet Take by mouth. 04/21/21   [provider]      Allergies    Sulfa antibiotics and Lisinopril    Review of Systems   Review of Systems  All other systems reviewed and are negative.   Physical Exam Updated Vital Signs BP (!) 121/57   Pulse (!) 121   Temp 98.9 F (37.2 C) (Oral)   Resp 17   SpO2 92%  Physical Exam Vitals and nursing note reviewed.  Constitutional:      General: She is not in acute distress.    Appearance: She is well-developed. She is ill-appearing.  HENT:     Head: Normocephalic.     Comments: Cellulitis and abscess noted to the posterior scalp, neck lesion, cellulitis surrounding the ear, radiating to the left orbit, no pain on extraocular movements Eyes:     Conjunctiva/sclera: Conjunctivae normal.     Comments: EOMs intact  Cardiovascular:     Rate and Rhythm: Regular rhythm. Tachycardia present.     Heart sounds: No murmur heard. Pulmonary:     Effort: Pulmonary effort is normal. No respiratory distress.     Breath sounds: Normal breath sounds.  Abdominal:     Palpations: Abdomen is soft.     Tenderness: There is no abdominal tenderness.  Musculoskeletal:        General: No swelling.     Cervical back: Neck supple.  Skin:    General: Skin is warm and dry.     Capillary Refill: Capillary refill takes less than 2 seconds.  Neurological:     Mental Status: She is alert.  Psychiatric:        Mood and Affect: Mood normal.     ED Results / Procedures / Treatments   Labs (all labs ordered are listed, but only abnormal results are displayed) Labs Reviewed  COMPREHENSIVE METABOLIC PANEL - Abnormal; Notable for the following components:      Result Value   Glucose, Bld 347 (*)    Calcium 8.7 (*)    Albumin 3.4 (*)    AST 12 (*)    All other components within normal limits  CBC WITH DIFFERENTIAL/PLATELET - Abnormal; Notable for the following  components:   WBC 11.1 (*)    Neutro Abs 9.0 (*)    All other components within normal limits  CULTURE, BLOOD (ROUTINE X 2)  CULTURE, BLOOD (ROUTINE X 2)  LACTIC ACID, PLASMA  LACTIC ACID, PLASMA  PROTIME-INR  APTT  URINALYSIS, ROUTINE W REFLEX MICROSCOPIC  I-STAT BETA HCG BLOOD, ED ([facility], [facility], [facility] ONLY)    EKG EKG Interpretation  Date/Time:  Saturday October 16 2021 17:56:06 EDT Ventricular Rate:  114 PR Interval:  166 QRS Duration: 100 QT Interval:  319 QTC Calculation: 440 R Axis:   34 Text Interpretation: Sinus tachycardia Borderline T abnormalities, anterior leads Confirmed by Ernie Avena ([id number]) on 10/16/2021 5:57:23 PM  Radiology DG  Chest 1 View  Result Date: 10/16/2021 CLINICAL DATA:  Severe headache. EXAM: CHEST  1 VIEW COMPARISON:  None Available. FINDINGS: The heart size and mediastinal contours are within normal limits. Both lungs are clear. The visualized skeletal structures are unremarkable. IMPRESSION: No active disease. Electronically Signed   By: Kennith Center M.D.   On: 10/16/2021 14:24    Procedures .Critical Care  Performed by: Ernie Avena, MD Authorized by: Ernie Avena, MD   Critical care provider statement:    Critical care time (minutes):  30   Critical care was necessary to treat or prevent imminent or life-threatening deterioration of the following conditions:  Sepsis   Critical care was time spent personally by me on the following activities:  Development of treatment plan with patient or surrogate, discussions with consultants, evaluation of patient's response to treatment, examination of patient, ordering and review of laboratory studies, ordering and review of radiographic studies, ordering and performing treatments and interventions, pulse oximetry, re-evaluation of patient's condition and review of old charts   Care discussed with: admitting provider   .Marland KitchenIncision and Drainage  Date/Time: 10/16/2021 9:19 PM  Performed by: Ernie Avena,  MD Authorized by: Ernie Avena, MD   Consent:    Consent obtained:  Verbal   Consent given by:  Patient   Risks discussed:  Bleeding, incomplete drainage and infection   Alternatives discussed:  No treatment and delayed treatment Universal protocol:    Patient identity confirmed:  Arm band Location:    Type:  Abscess   Size:  2cm   Location:  Head   Head location:  Scalp Anesthesia:    Anesthesia method:  Local infiltration   Local anesthetic:  Lidocaine 1% WITH epi Procedure type:    Complexity:  Simple Procedure details:    Needle aspiration: yes     Needle size:  18 G   Drainage:  Purulent Post-procedure details:    Procedure completion:  Tolerated .Marland KitchenIncision and Drainage  Date/Time: 10/16/2021 9:20 PM  Performed by: Ernie Avena, MD Authorized by: Ernie Avena, MD   Consent:    Consent obtained:  Verbal   Consent given by:  Patient   Risks discussed:  Bleeding, incomplete drainage and infection   Alternatives discussed:  Delayed treatment and no treatment Universal protocol:    Patient identity confirmed:  Arm band Location:    Type:  Abscess   Location:  Head   Head location:  Scalp Sedation:    Sedation type:  None Anesthesia:    Anesthesia method:  Local infiltration   Local anesthetic:  Lidocaine 1% WITH epi Procedure type:    Complexity:  Simple Procedure details:    Needle aspiration: yes     Needle size:  18 G   Drainage:  Purulent   Wound treatment:  Wound left open Post-procedure details:    Procedure completion:  Tolerated     Medications Ordered in ED Medications  vancomycin (VANCOREADY) IVPB 2000 mg/400 mL (2,000 mg Intravenous New Bag/Given 10/16/21 2026)  vancomycin (VANCOREADY) IVPB 750 mg/150 mL (has no administration in time range)  lactated ringers infusion (has no administration in time range)  lactated ringers bolus 1,000 mL (1,000 mLs Intravenous New Bag/Given 10/16/21 2027)  lidocaine-EPINEPHrine (XYLOCAINE W/EPI) 2 %-1:200000  (PF) injection (has no administration in time range)  cefTRIAXone (ROCEPHIN) 2 g in sodium chloride 0.9 % 100 mL IVPB (0 g Intravenous Stopped 10/16/21 2027)  HYDROmorphone (DILAUDID) injection 1 mg (1 mg Intravenous Given 10/16/21 1721)  ondansetron (ZOFRAN) injection  4 mg (4 mg Intravenous Given 10/16/21 1719)  HYDROcodone-acetaminophen (NORCO/VICODIN) 5-325 MG per tablet 1 tablet (1 tablet Oral Given 10/16/21 1751)  iohexol (OMNIPAQUE) 300 MG/ML solution 100 mL (100 mLs Intravenous Contrast Given 10/16/21 1952)  HYDROmorphone (DILAUDID) injection 1 mg (1 mg Intravenous Given 10/16/21 2031)    ED Course/ Medical Decision Making/ A&P                           Medical Decision Making Amount and/or Complexity of Data Reviewed Radiology: ordered.  Risk Prescription drug management. Decision regarding hospitalization.   46 year old female with a history of left BKA, diabetes, morbid obesity, osteomyelitis, bacteremia due to Staph aureus presenting to the emergency department with a chief complaint of worsening left facial cellulitis despite outpatient antibiotics.  The patient is 8 months status post left BKA.  The patient is that she has been on outpatient Augmentin for the past 2 weeks.  She has left scalp swelling and redness that has been now spreading to around her left eye.  She denies any pain with extraocular movements.  She denies any fevers.  She states that symptoms have been worsening despite outpatient antibiotics. She endorses severe pain in her face.  She has an abscess to the posterior scalp and an open sore on the back of her neck to the right.  She also endorses severe pain in the left ear and face.  She has a history of recurrent skin infections.  On arrival, the patient was afebrile, tachycardic P110, hypertensive BP 172/97, not tachypneic, saturating at 100% on room air.  Sinus tachycardia noted on cardiac telemetry  My immediate concern is for a life-threatening infection in this  patient.   The most likely infectious source is skin from scalp abscesses and facial cellulitis and scalp cellulitis.  Sepsis initiated after patient laboratory work-up significantly resulted for leukocytosis to 11.1.  Patient admits to attempting to I&D her scalp on her own at home.  She states that she stuck a needle into swelling in her scalp.  She also states that she scratches her scalp frequently.  She has a history of skin infections in the past.  The patient's scalp abscesses were I&D did further procedure note above.  I have a low suspicion for meningitis, osteomyelitis, endocarditis, pyelonephritis.  Labs: Cytosis 11.1, hemoglobin stable, CMP with hyperglycemia to 347, no acute electrolyte abnormality, normal renal function, normal liver function, blood cultures collected and pending, lactic acid normal, hCG normal.  Imaging: CT imaging of the head and maxillofacial revealed evidence of scalp abscesses with left facial cellulitis.  No evidence of intracranial extension and no evidence of orbital cellulitis.  For treatment, the patient was given 1 L IV fluid bolus for IVF and vancomycin and Rocephin for antimicrobials.   Upon reassessment, the patient appears clinically improved. The patient is not currently hypotensive and has a MAP of >65. The patient's volume status appears to be improved after fluid resuscitation.  Based off the presentation and lab findings, the patient meets criteria for sepsis from scalp abscesses and scalp and facial cellulitis.  [SEVERE SEPSIS] HYPOTENSION (Nursing BPA for hypotension = Is this sepsis?) CREATININE > 2.0, or urine output < 0.5 mL/kg/hour for 2 hours  BILIRUBIN > 2 mg/dL (40.934.2 mmol/L)  PLATELET COUNT < 100,000  INR > 1.5 or aPTT > 60 sec  LACTATE > 2 mmol/L   [SEPTIC SHOCK] HYPOTENSION REFRACTORY TO 30 cc/kg OF FLUIDS LACTATE > 4 mmol/L  While in the ED, the patient was given: Medications  vancomycin (VANCOREADY) IVPB 2000 mg/400 mL  (2,000 mg Intravenous New Bag/Given 10/16/21 2026)  vancomycin (VANCOREADY) IVPB 750 mg/150 mL (has no administration in time range)  lactated ringers infusion (has no administration in time range)  lactated ringers bolus 1,000 mL (1,000 mLs Intravenous New Bag/Given 10/16/21 2027)  lidocaine-EPINEPHrine (XYLOCAINE W/EPI) 2 %-1:200000 (PF) injection (has no administration in time range)  cefTRIAXone (ROCEPHIN) 2 g in sodium chloride 0.9 % 100 mL IVPB (0 g Intravenous Stopped 10/16/21 2027)  HYDROmorphone (DILAUDID) injection 1 mg (1 mg Intravenous Given 10/16/21 1721)  ondansetron (ZOFRAN) injection 4 mg (4 mg Intravenous Given 10/16/21 1719)  HYDROcodone-acetaminophen (NORCO/VICODIN) 5-325 MG per tablet 1 tablet (1 tablet Oral Given 10/16/21 1751)  iohexol (OMNIPAQUE) 300 MG/ML solution 100 mL (100 mLs Intravenous Contrast Given 10/16/21 1952)  HYDROmorphone (DILAUDID) injection 1 mg (1 mg Intravenous Given 10/16/21 2031)     Lori Schmidt was admitted to the hospital for ongoing treatment and monitoring.  Final Clinical Impression(s) / ED Diagnoses Final diagnoses:  Periorbital cellulitis of left eye  Facial cellulitis  Sepsis, due to unspecified organism, unspecified whether acute organ dysfunction present [facility])    Rx / DC Orders ED Discharge Orders     None         Ernie Avena, MD 10/16/21 2124

## 2021-10-16 NOTE — Progress Notes (Signed)
Pharmacy Antibiotic Note  Lori Schmidt is a 46 y.o. female admitted on 10/16/2021 presenting with worsening abscess in scalp on abx PTA.  Pharmacy has been consulted for vancomycin dosing.  Plan: Vancomycin 2g IV x 1, then 750 mg IV q 8h (eAUC 481, SCr used 0.8) Monitor renal function, clinical progression to narrow Vancomycin levels as needed     Temp (24hrs), Avg:98.9 F (37.2 C), Min:98.9 F (37.2 C), Max:98.9 F (37.2 C)  Recent Labs  Lab 10/16/21 1323  WBC 11.1*  CREATININE 0.48  LATICACIDVEN 1.5    CrCl cannot be calculated (Unknown ideal weight.).    Allergies  Allergen Reactions   Sulfa Antibiotics Other (See Comments)    Makes sick   Lisinopril Cough    Daylene Posey, PharmD Clinical Pharmacist ED Pharmacist Phone # [phone] 10/16/2021 4:22 PM

## 2021-10-16 NOTE — H&P (Incomplete)
     Date: 10/17/2021               Patient Name:  Lori Schmidt MRN: [id number]  DOB: 24-Jan-1976 Age / Sex: 46 y.o., female   PCP: Primus Bravo, NP         Medical Service: Internal Medicine Teaching Service         Attending Physician: Dr. Inez Catalina, MD    First Contact: Dr. Cliffton Asters Pager: [phone]  Second Contact: Dr. Austin Miles Pager: [phone]       After Hours (After 5p/  First Contact Pager: [phone]  weekends / holidays): Second Contact Pager: [phone]   Chief Complaint: Scalp abscess  History of Present Illness: Ms. Romey is a 45 year old female PMH type 2 diabetes with insulin use, left BKA secondary to osteomyelitis, hypertension, hyperlipidemia presents to the ED for worsening scalp abscesses and left facial swelling. She reports noticing small bumps around the hair follicles on the left side of her scalp that started in mid June. She went to her PCP and was prescribed 1 week of Cipro.  It did not improve.  She tried [employer] with no relief.  The abscesses worsen and in the past 5 days she noticed swelling on the left side of her face. She reports facial fullness and pain in her left ear down to jaw. Yesterday, she then went to urgent care and was given 1 dose of IV ceftriaxone. Denies any vision changes or eye pain. Denies fever, chills, headache, nausea, or vomiting. She did have mild neck stiffness.  Reports having boils in between her legs along the pelvic folds. Denies any current infection there. History of boils under her arms with surgical history of cyst removal at her right underarm.  Meds:  -Januvia -Insulin 40 units twice daily with no mealtime -Losartan -Pravastatin -Celexa -B12 -Magnesium  -She stopped metformin a few weeks ago  No outpatient medications have been marked as taking for the 10/16/21 encounter [facility] Encounter).     Allergies: Allergies as of 10/16/2021 - Review Complete 10/16/2021  Allergen Reaction Noted  . Sulfa  antibiotics Other (See Comments) 01/05/2012  . Lisinopril Cough 11/13/2014   Past Medical History:  Diagnosis Date  . Hyperlipidemia   . Hypertension   . Obesity   . Type 2 diabetes mellitus (HCC)     Family History: Patient reports father passed away from heart attack and mother has cardiovascular disease.  Social History: Denies tobacco use, alcohol use and illicit drug use.  She works as a Museum/gallery curator at [employer].  She is currently living with her 2 children.  Review of Systems: A complete ROS was negative except as per HPI. ***  Physical Exam: Blood pressure 137/79, pulse (!) 109, temperature 97.9 F (36.6 C), temperature source Oral, resp. rate 17, SpO2 93 %. ***  EKG: personally reviewed my interpretation is***  CXR: personally reviewed my interpretation is***  Assessment & Plan by Problem: Principal Problem:   Abscess of skin and subcutaneous tissue Active Problems:   Diabetes mellitus (HCC)   S/P BKA (below knee amputation) unilateral, left (HCC)   Dispo: Admit patient to {STATUS:3044014::"Observation with expected length of stay less than 2 midnights.","Inpatient with expected length of stay greater than 2 midnights."}  Signed: Rana Snare, DO 10/17/2021, 12:01 AM  Pager: [phone] After 5pm on weekdays and 1pm on weekends: On Call pager: [phone]

## 2021-10-16 NOTE — ED Notes (Signed)
Patient denies pain and is resting comfortably.  

## 2021-10-17 DIAGNOSIS — T383X6A Underdosing of insulin and oral hypoglycemic [antidiabetic] drugs, initial encounter: Secondary | ICD-10-CM | POA: Diagnosis present

## 2021-10-17 DIAGNOSIS — E1165 Type 2 diabetes mellitus with hyperglycemia: Secondary | ICD-10-CM | POA: Diagnosis present

## 2021-10-17 DIAGNOSIS — A4101 Sepsis due to Methicillin susceptible Staphylococcus aureus: Secondary | ICD-10-CM | POA: Diagnosis present

## 2021-10-17 DIAGNOSIS — Z6841 Body Mass Index (BMI) 40.0 and over, adult: Secondary | ICD-10-CM | POA: Diagnosis not present

## 2021-10-17 DIAGNOSIS — L02811 Cutaneous abscess of head [any part, except face]: Secondary | ICD-10-CM

## 2021-10-17 DIAGNOSIS — E11628 Type 2 diabetes mellitus with other skin complications: Secondary | ICD-10-CM | POA: Diagnosis present

## 2021-10-17 DIAGNOSIS — L03211 Cellulitis of face: Secondary | ICD-10-CM | POA: Diagnosis present

## 2021-10-17 DIAGNOSIS — E785 Hyperlipidemia, unspecified: Secondary | ICD-10-CM | POA: Diagnosis present

## 2021-10-17 DIAGNOSIS — H6012 Cellulitis of left external ear: Secondary | ICD-10-CM | POA: Diagnosis present

## 2021-10-17 DIAGNOSIS — Z8619 Personal history of other infectious and parasitic diseases: Secondary | ICD-10-CM | POA: Diagnosis not present

## 2021-10-17 DIAGNOSIS — Z794 Long term (current) use of insulin: Secondary | ICD-10-CM | POA: Diagnosis not present

## 2021-10-17 DIAGNOSIS — Z79899 Other long term (current) drug therapy: Secondary | ICD-10-CM | POA: Diagnosis not present

## 2021-10-17 DIAGNOSIS — Z793 Long term (current) use of hormonal contraceptives: Secondary | ICD-10-CM | POA: Diagnosis not present

## 2021-10-17 DIAGNOSIS — Z888 Allergy status to other drugs, medicaments and biological substances status: Secondary | ICD-10-CM | POA: Diagnosis not present

## 2021-10-17 DIAGNOSIS — Z7984 Long term (current) use of oral hypoglycemic drugs: Secondary | ICD-10-CM | POA: Diagnosis not present

## 2021-10-17 DIAGNOSIS — L738 Other specified follicular disorders: Secondary | ICD-10-CM | POA: Diagnosis present

## 2021-10-17 DIAGNOSIS — Z89512 Acquired absence of left leg below knee: Secondary | ICD-10-CM | POA: Diagnosis not present

## 2021-10-17 DIAGNOSIS — L0291 Cutaneous abscess, unspecified: Secondary | ICD-10-CM | POA: Diagnosis present

## 2021-10-17 DIAGNOSIS — I10 Essential (primary) hypertension: Secondary | ICD-10-CM | POA: Diagnosis present

## 2021-10-17 DIAGNOSIS — Z882 Allergy status to sulfonamides status: Secondary | ICD-10-CM | POA: Diagnosis not present

## 2021-10-17 DIAGNOSIS — L03221 Cellulitis of neck: Secondary | ICD-10-CM | POA: Diagnosis present

## 2021-10-17 DIAGNOSIS — L03213 Periorbital cellulitis: Secondary | ICD-10-CM | POA: Diagnosis present

## 2021-10-17 LAB — BASIC METABOLIC PANEL
Anion gap: 15 (ref 5–15)
BUN: 12 mg/dL (ref 6–20)
CO2: 20 mmol/L — ABNORMAL LOW (ref 22–32)
Calcium: 8.5 mg/dL — ABNORMAL LOW (ref 8.9–10.3)
Chloride: 102 mmol/L (ref 98–111)
Creatinine, Ser: 0.48 mg/dL (ref 0.44–1.00)
GFR, Estimated: >60 mL/min (ref >60)
Glucose, Bld: 298 mg/dL — ABNORMAL HIGH (ref 70–99)
Potassium: 4 mmol/L (ref 3.5–5.1)
Sodium: 137 mmol/L (ref 135–145)

## 2021-10-17 LAB — CBG MONITORING, ED
Glucose-Capillary: 284 mg/dL — ABNORMAL HIGH (ref 70–99)
Glucose-Capillary: 264 mg/dL — ABNORMAL HIGH (ref 70–99)

## 2021-10-17 LAB — CBC
HCT: 34.8 % — ABNORMAL LOW (ref 36.0–46.0)
Hemoglobin: 11.3 g/dL — ABNORMAL LOW (ref 12.0–15.0)
MCH: 28.3 pg (ref 26.0–34.0)
MCHC: 32.5 g/dL (ref 30.0–36.0)
MCV: 87 fL (ref 80.0–100.0)
Platelets: 212 10*3/uL (ref 150–400)
RBC: 4 MIL/uL (ref 3.87–5.11)
RDW: 11.6 % (ref 11.5–15.5)
WBC: 11.2 10*3/uL — ABNORMAL HIGH (ref 4.0–10.5)
nRBC: 0 % (ref 0.0–0.2)

## 2021-10-17 LAB — GLUCOSE, CAPILLARY
Glucose-Capillary: 361 mg/dL — ABNORMAL HIGH (ref 70–99)
Glucose-Capillary: 218 mg/dL — ABNORMAL HIGH (ref 70–99)

## 2021-10-17 LAB — MRSA NEXT GEN BY PCR, NASAL: MRSA by PCR Next Gen: NOT DETECTED

## 2021-10-17 MED ORDER — CEFAZOLIN SODIUM-DEXTROSE 1-4 GM/50ML-% IV SOLN
1.0000 g | Freq: Three times a day (TID) | INTRAVENOUS | Status: DC
  Administered 2021-10-18 – 2021-10-19 (×4): 1 g via INTRAVENOUS
  Filled 2021-10-17 (×6): qty 50

## 2021-10-17 MED ORDER — ONDANSETRON HCL 4 MG/2ML IJ SOLN
4.0000 mg | Freq: Four times a day (QID) | INTRAMUSCULAR | Status: DC | PRN
  Administered 2021-10-17 (×2): 4 mg via INTRAVENOUS
  Filled 2021-10-17 (×2): qty 2

## 2021-10-17 MED ORDER — ACETAMINOPHEN 500 MG PO TABS
1000.0000 mg | ORAL_TABLET | Freq: Once | ORAL | Status: AC
  Administered 2021-10-17: 1000 mg via ORAL
  Filled 2021-10-17: qty 2

## 2021-10-17 MED ORDER — LOSARTAN POTASSIUM 25 MG PO TABS
25.0000 mg | ORAL_TABLET | Freq: Every day | ORAL | Status: DC
  Administered 2021-10-17 – 2021-10-18 (×2): 25 mg via ORAL
  Filled 2021-10-17 (×2): qty 1

## 2021-10-17 NOTE — ED Notes (Signed)
MD aware of pts pain

## 2021-10-17 NOTE — ED Notes (Signed)
RN re taped pts IV

## 2021-10-17 NOTE — ED Notes (Signed)
Patient cc of a headache and feeling sick. Provider aware.

## 2021-10-17 NOTE — ED Notes (Signed)
Pt ambulatory to bathroom

## 2021-10-17 NOTE — ED Notes (Signed)
Patient is resting comfortably. 

## 2021-10-17 NOTE — Progress Notes (Signed)
   Subjective:   No acute overnight events.  Patient states that she is feeling okay at this time. The last I&D hurt a lot and she would like to avoid another if possible. She has a long history of abscesses and skin infections, especially in hair follicles and in her armpits (she previously needed surgical removal of an abscess/cyst in her right armpit). She states that they typically improve with oral antibiotics but this current episode feels worse. She does not understand why these keep occurring as she does her best with maintaining good hygiene.  She would like to establish care at [facility] here after discharge.  Objective:  Vital signs in last 24 hours: Vitals:   10/17/21 1115 10/17/21 1135 10/17/21 1145 10/17/21 1200  BP: (!) 135/93 (!) 142/94 117/70 (!) 141/84  Pulse: (!) 106 (!) 104 (!) 103 (!) 106  Resp: 19 16 18 16   Temp:      TempSrc:      SpO2: 98% 96% 95% 94%   General: well-appearing middle aged female, sitting up in bed, NAD. HENT: erythematous nose, swelling of upper pinna of left ear. Normal ear canal with normal appearing TM. Area of swelling and erythema superior to the left ear, not actively draining. Other areas of swelling and erythema on posterior scalp, one that was drained in the ED but remains fluctuant. Eyes: left eye periorbital swelling, but no pain with movement or palpation and EOMI. CV: tachycardic rate with normal rhythm, no m/r/g. Pulm: CTABL, no adventitious sounds noted. MSK: [name] with prosthesis in place Psych: normal mood and affect  Assessment/Plan:  Principal Problem:   Abscess of skin and subcutaneous tissue Active Problems:   Diabetes mellitus (HCC)   S/P BKA (below knee amputation) unilateral, left (HCC)  Scalp cellulitis Periorbital cellulitis Patient with history of Staph infection and [name], and has previously required surgical intervention for abscess/cyst in her R axillae. 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 It is possible that patient has hidradenitis supportiva and that this may be contributing to recurrent infections. Previously, it appears that infections were from MSSA. Will obtain MRSA nasal swab to determine appropriate antibiotic regimen, but will continue vancomycin until this results. Boil above left ear is not currently amenable to drainage. -warm compresses over affected areas to see if they will drain independently -continue vancomycin -f/u MRSA nasal swab, if negative will de-escalate to cefazolin -f/u blood cultures -tylenol q6 prn and oxycodone q4 prn for pain relief -will likely need daily doxycycline at discharge to minimize risk of further infections -consider dermatology evaluation as an outpatient  T2DM with hyperglycemia Last A1c of 8.9%. On levemir 55u qhs, metformin 1000mg  BID, januvia 100mg  daily, and farxiga 5mg  daily at home. -levemir 30u BID -resistant SSI TID AC + HS  HTN BP mildly elevated. -restart home cozaar  HLD -continue home pravastatin  Prior to Admission Living Arrangement: home Anticipated Discharge Location: home Barriers to Discharge: continued medical management Dispo: Anticipated discharge in approximately 1-2 day(s).   , MD 10/17/2021, 12:21 PM Pager: [phone] After 5pm on weekdays and 1pm on weekends: On Call pager [phone]

## 2021-10-18 DIAGNOSIS — L02811 Cutaneous abscess of head [any part, except face]: Secondary | ICD-10-CM | POA: Diagnosis not present

## 2021-10-18 LAB — CBC WITH DIFFERENTIAL/PLATELET
Abs Immature Granulocytes: 0.05 10*3/uL (ref 0.00–0.07)
Basophils Absolute: 0 10*3/uL (ref 0.0–0.1)
Basophils Relative: 0 %
Eosinophils Absolute: 0.2 10*3/uL (ref 0.0–0.5)
Eosinophils Relative: 2 %
HCT: 34.1 % — ABNORMAL LOW (ref 36.0–46.0)
Hemoglobin: 11.3 g/dL — ABNORMAL LOW (ref 12.0–15.0)
Immature Granulocytes: 1 %
Lymphocytes Relative: 16 %
Lymphs Abs: 1.6 10*3/uL (ref 0.7–4.0)
MCH: 28.4 pg (ref 26.0–34.0)
MCHC: 33.1 g/dL (ref 30.0–36.0)
MCV: 85.7 fL (ref 80.0–100.0)
Monocytes Absolute: 0.7 10*3/uL (ref 0.1–1.0)
Monocytes Relative: 8 %
Neutro Abs: 7 10*3/uL (ref 1.7–7.7)
Neutrophils Relative %: 73 %
Platelets: 234 10*3/uL (ref 150–400)
RBC: 3.98 MIL/uL (ref 3.87–5.11)
RDW: 11.5 % (ref 11.5–15.5)
WBC: 9.6 10*3/uL (ref 4.0–10.5)
nRBC: 0 % (ref 0.0–0.2)

## 2021-10-18 LAB — GLUCOSE, CAPILLARY
Glucose-Capillary: 199 mg/dL — ABNORMAL HIGH (ref 70–99)
Glucose-Capillary: 281 mg/dL — ABNORMAL HIGH (ref 70–99)
Glucose-Capillary: 345 mg/dL — ABNORMAL HIGH (ref 70–99)
Glucose-Capillary: 240 mg/dL — ABNORMAL HIGH (ref 70–99)

## 2021-10-18 LAB — HEMOGLOBIN A1C
Hgb A1c MFr Bld: 10.1 % — ABNORMAL HIGH (ref 4.8–5.6)
Mean Plasma Glucose: 243.17 mg/dL

## 2021-10-18 MED ORDER — ORAL CARE MOUTH RINSE: 15.0000 mL | OROMUCOSAL | Status: DC | PRN

## 2021-10-18 NOTE — Progress Notes (Signed)
Inpatient Diabetes Program Recommendations  AACE/ADA: New Consensus Statement on Inpatient Glycemic Control (2015)  Target Ranges:  Prepandial:   less than 140 mg/dL      Peak postprandial:   less than 180 mg/dL (1-2 hours)      Critically ill patients:  140 - 180 mg/dL   Lab Results  Component Value Date   GLUCAP 281 (H) 10/18/2021    Review of Glycemic Control  Latest Reference Range & Units 10/17/21 08:01 10/17/21 11:26 10/17/21 18:28 10/17/21 21:23 10/18/21 07:52 10/18/21 11:27  Glucose-Capillary 70 - 99 mg/dL 903 (H) 833 (H) 383 (H) 218 (H) 199 (H) 281 (H)   Diabetes history: DM 2 Outpatient Diabetes medications:  Farxiga 5 mg daily, Levemir 55 units q HS, Humalog 8 units tid with meals (not taking), Metformin 1000 mg bid (not taking) Current orders for Inpatient glycemic control:  Novolog 0-20 units tid with meals and HS Levemir 30 units bid  Inpatient Diabetes Program Recommendations:    Please add Novolog 6 units tid with meals and consider increasing Levemir to 32 units bid.   Thanks,  Beryl Meager, RN, BC-ADM Inpatient Diabetes Coordinator Pager [phone]  (8a-5p)

## 2021-10-18 NOTE — Progress Notes (Signed)
Mobility Specialist Progress Note:   10/18/21 1145  Mobility  Activity Ambulated independently in room  Level of Assistance Independent  Assistive Device None  Distance Ambulated (ft) 50 ft  Activity Response Tolerated well  $Mobility charge 1 Mobility   Pt received EOB, agreeable to short bout of ambulation. Pt with LLE prosthesis donned upon arrival. Pt with minor unsteadiness upon standing, attributes this to min OOB mobility. Encouraged frequent OOB mobility, pt agreed. Left sitting EOB, will f/u later today for hopeful hallway ambulation.   Addison Lank Acute Rehab Secure Chat or Office Phone: [phone]

## 2021-10-18 NOTE — Progress Notes (Addendum)
Subjective:   Summary: Lori Schmidt is a 46 year old woman with PMH of DM2, Left BKA due to staph infection, HTN, HLD who presented to the ED for worsening scalp edema, erythema, pain and abscesses.  Overnight Events: NAEO. Did complain of pain but she received scheduled oxy and was comfortable afterward.  Today, she feels her pain has improved. She states the boil above her left ear popped last night and this provided some relief.   Objective:  Vital signs in last 24 hours: Vitals:   10/18/21 0017 10/18/21 0439 10/18/21 0500 10/18/21 0724  BP: 133/64 120/71  133/80  Pulse: 97 89  97  Resp: 18 18  16   Temp: 97.8 F (36.6 C) 97.9 F (36.6 C)  98.6 F (37 C)  TempSrc: Oral Oral  Oral  SpO2: 99% 98%  97%  Weight:   125.2 kg    Supplemental O2: Room Air SpO2: 97 %   Physical Exam:  Constitutional: well-appearing female sitting in hospital bed, in no acute distress HENT: Erythematous nose, swelling and erythema of upper pinna of left ear. Area of swelling and erythema supoerior to left ear, actively draining grey/green fluid. Additional swelling, erythema at vertex, right scalp, and posterior scalp. Eyes: conjunctiva non-erythematous Neck: supple Cardiovascular: regular rate and rhythm Pulmonary/Chest: normal work of breathing on room air Neurological: alert & oriented x 3 MSK: [name], surgical scars at right axilla. No evidence of skin infection of axilla Psych: normal mood and affect   Filed Weights   10/18/21 0500  Weight: 125.2 kg     Intake/Output Summary (Last 24 hours) at 10/18/2021 1007 Last data filed at 10/18/2021 0528 Gross per 24 hour  Intake 945.11 ml  Output --  Net 945.11 ml   Net IO Since Admission: 2,817 mL [10/18/21 1007]  Pertinent Labs:    Latest Ref Rng & Units 10/18/2021    1:09 AM 10/17/2021    5:17 AM 10/16/2021    1:23 PM  CBC  WBC 4.0 - 10.5 K/uL 9.6  11.2  11.1   Hemoglobin 12.0 - 15.0 g/dL 12/17/2021  53.6  64.4   Hematocrit 36.0 - 46.0 %  34.1  34.8  37.2   Platelets 150 - 400 K/uL 234  212  245        Latest Ref Rng & Units 10/17/2021    5:17 AM 10/16/2021    1:23 PM 03/22/2021    3:09 AM  CMP  Glucose 70 - 99 mg/dL 14/03/2021  742  99   BUN 6 - 20 mg/dL 12  18  18    Creatinine 0.44 - 1.00 mg/dL 595   6.38   Sodium 135 - 145 mmol/L 137  138  139   Potassium 3.5 - 5.1 mmol/L 4.0  4.2  3.6   Chloride 98 - 111 mmol/L 102  104  105   CO2 22 - 32 mmol/L 20  22  26    Calcium 8.9 - 10.3 mg/dL 8.5  8.7  8.3   Total Protein 6.5 - 8.1 g/dL  6.9  5.8   Total Bilirubin 0.3 - 1.2 mg/dL  0.5  0.4   Alkaline Phos 38 - 126 U/L  113  75   AST 15 - 41 U/L  12  14   ALT 0 - 44 U/L  17  7     Imaging: No results found.  Assessment/Plan:   Principal Problem:   Abscess of skin and subcutaneous tissue Active Problems:  Diabetes mellitus (HCC)   S/P BKA (below knee amputation) unilateral, left [facility])   Patient Summary: Lori Schmidt is a 46 year old woman with PMH of DM2, Left BKA due to staph infection, HTN, HLD who presented to the ED for worsening scalp edema, erythema, pain and abscesses.  Scalp cellulitis Periorbital cellulitis Patient has has recurrent episodes of cellulitis/folliculitis with boils and positive cultures for MSSA. 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 She has not seen dermatology for this. Received 2 days of vancomycin. MRSA nasal swab negative.  -De-escalated to cefazolin on 7/10, improvement in swelling, erythema, and pain. Will continue IV abx today, plan to transition to treatment dose doxycycline for 7-10 days total -warm compresses over affected areas to see if they will drain independently (one did so overnight) -f/u blood cultures -tylenol q6 prn and oxycodone q4 prn for pain -daily doxycycline at discharge for to prevent recurrence -consider dermatology evaluation as an outpatient. Will refer to  [facility] HS clinic.  T2DM with hyperglycemia Last A1c of 8.9%. On levemir 55u qhs, metformin 1000mg  BID, Januvia 100 mg daily, and farxiga 5 mg daily at home. -Levemir 30u BID -resistant SSI TID AC + HS   HTN Home Cozaar.   HLD Home pravastatin.   Prior to Admission Living Arrangement: home Anticipated Discharge Location: home Barriers to Discharge: continued medical management Dispo: Anticipated discharge in approximately 1-2 day(s).  MD Internal Medicine Resident PGY-1 Please contact the on call pager after 5 pm and on weekends at [phone].

## 2021-10-18 NOTE — Progress Notes (Signed)
Mobility Specialist Progress Note:   10/18/21 1600  Mobility  Activity Ambulated independently in hallway  Level of Assistance Independent  Assistive Device None  Distance Ambulated (ft) 550 ft  Activity Response Tolerated well  $Mobility charge 1 Mobility   Pt eager for mobility session. Ambulated independently around unit. Pt back in room with all needs met.   Nelta Numbers Acute Rehab Secure Chat or Office Phone: [phone]

## 2021-10-19 DIAGNOSIS — L02811 Cutaneous abscess of head [any part, except face]: Secondary | ICD-10-CM | POA: Diagnosis not present

## 2021-10-19 LAB — CBC WITH DIFFERENTIAL/PLATELET
Abs Immature Granulocytes: 0.02 10*3/uL (ref 0.00–0.07)
Basophils Absolute: 0 10*3/uL (ref 0.0–0.1)
Basophils Relative: 0 %
Eosinophils Absolute: 0.1 10*3/uL (ref 0.0–0.5)
Eosinophils Relative: 2 %
HCT: 34.4 % — ABNORMAL LOW (ref 36.0–46.0)
Hemoglobin: 11.5 g/dL — ABNORMAL LOW (ref 12.0–15.0)
Immature Granulocytes: 0 %
Lymphocytes Relative: 20 %
Lymphs Abs: 1.4 10*3/uL (ref 0.7–4.0)
MCH: 28.6 pg (ref 26.0–34.0)
MCHC: 33.4 g/dL (ref 30.0–36.0)
MCV: 85.6 fL (ref 80.0–100.0)
Monocytes Absolute: 0.5 10*3/uL (ref 0.1–1.0)
Monocytes Relative: 7 %
Neutro Abs: 5 10*3/uL (ref 1.7–7.7)
Neutrophils Relative %: 71 %
Platelets: 265 10*3/uL (ref 150–400)
RBC: 4.02 MIL/uL (ref 3.87–5.11)
RDW: 11.6 % (ref 11.5–15.5)
WBC: 7.1 10*3/uL (ref 4.0–10.5)
nRBC: 0 % (ref 0.0–0.2)

## 2021-10-19 LAB — GLUCOSE, CAPILLARY
Glucose-Capillary: 235 mg/dL — ABNORMAL HIGH (ref 70–99)
Glucose-Capillary: 284 mg/dL — ABNORMAL HIGH (ref 70–99)

## 2021-10-19 MED ORDER — DOXYCYCLINE HYCLATE 100 MG PO CAPS: 100.0000 mg | ORAL_CAPSULE | Freq: Two times a day (BID) | ORAL | 0 refills | Status: AC

## 2021-10-19 NOTE — Discharge Summary (Addendum)
Name: Lori Schmidt MRN: [id number] DOB: 05-Mar-1976 46 y.o. PCP: Nena Polio, NP  Date of Admission: 10/16/2021  1:00 PM Date of Discharge: 10/19/2021 Attending Physician: Charise Killian, MD  Discharge Diagnosis: 1. Principal Problem:   Abscess of skin and subcutaneous tissue Active Problems:   Diabetes mellitus (HCC)   S/P BKA (below knee amputation) unilateral, left [facility])  Discharge Medications: Allergies as of 10/19/2021       Reactions   Lisinopril Cough   Sulfa Antibiotics Other (See Comments)   Makes sick        Medication List     TAKE these medications    acetaminophen 325 MG tablet Commonly known as: TYLENOL Take 1-2 tablets (325-650 mg total) by mouth every 6 (six) hours as needed for mild pain (pain score 1-3 or temp > 100.5).   citalopram 40 MG tablet Commonly known as: CELEXA Take 1 tablet (40 mg total) by mouth at bedtime.   DEPO-PROVERA IM Inject into the muscle every 3 (three) months.   diclofenac Sodium 1 % Gel Commonly known as: VOLTAREN Apply 2 g topically 4 (four) times daily.   docusate sodium 100 MG capsule Commonly known as: COLACE Take 1 capsule (100 mg total) by mouth daily.   doxycycline 100 MG capsule Commonly known as: VIBRAMYCIN Take 1 capsule (100 mg total) by mouth 2 (two) times daily for 5 days. After 5 days, continue taking 1 capsule a day. What changed: additional instructions   Farxiga 5 MG Tabs tablet Generic drug: dapagliflozin propanediol Take 1 tablet (5 mg total) by mouth daily before breakfast.   HumaLOG KwikPen 100 UNIT/ML KwikPen Generic drug: insulin lispro Inject 8 Units into the skin 3 (three) times daily with meals.   Levemir FlexTouch 100 UNIT/ML FlexPen Generic drug: insulin detemir Inject 55 Units into the skin at bedtime.   losartan 25 MG tablet Commonly known as: COZAAR Take 1 tablet (25 mg total) by mouth at bedtime.   magnesium oxide 400 MG tablet Commonly known as: MAG-OX Take 1  tablet (400 mg total) by mouth daily.   metFORMIN 1000 MG tablet Commonly known as: GLUCOPHAGE Take 1 tablet (1,000 mg total) by mouth 2 (two) times daily with a meal.   methocarbamol 750 MG tablet Commonly known as: ROBAXIN Take 1 tablet (750 mg total) by mouth every 6 (six) hours.   pantoprazole 40 MG tablet Commonly known as: PROTONIX Take 1 tablet (40 mg total) by mouth daily.   pravastatin 40 MG tablet Commonly known as: PRAVACHOL Take 1 tablet (40 mg total) by mouth daily.   pregabalin 100 MG capsule Commonly known as: LYRICA Take 1 capsule (100 mg) by mouth twice daily and 2 capsules (200 mg total) nightly   sitaGLIPtin 100 MG tablet Commonly known as: JANUVIA Take by mouth.   vitamin B-12 1000 MCG tablet Commonly known as: CYANOCOBALAMIN Take 1/2 tablet (500 mcg total) by mouth daily.   vitamin C 1000 MG tablet Take 1 tablet (1,000 mg total) by mouth daily.        Disposition and follow-up:   Lori Schmidt was discharged from [facility] in Stable condition.  At the hospital follow up visit please address:  1.  Scalp Cellulitis: Patient was found to have cellulitis with abscesses located at scalp and preorbital area. Lori Schmidt was treated with IV cefazolin and will go home with doxycycline 100mg  BID for 5 days and 1 capsule a day thereafter. Please follow up abscesses clinically to ensure  continued healing as well as with CBC to ensure no further infection. Additionally, patient would likely benefit from appointment with local dermatologist and/or visit to Hidradenitis suppurativa clinic at [facility].  T2DM with hyperglycemia: Did not reach full control of glucose before discharge but Lori Schmidt will return home on levemir 55u qhs, metformin 1000mg  BID, Januvia 100 mg daily, and farxiga 5 mg daily . Repeat A1c of 10.1, up from 8.9 previously. Please follow up to ensure good glucose control as will likely need additions to home regimen.  2.  Labs / imaging  needed at time of follow-up: CBC, glucose  3.  Pending labs/ test needing follow-up: none  Follow-up Appointments:  Follow-up Information     [facility]. Go in 1 week(s).   Why: Someone will give you a call to schedule date and time. Contact information: [address] [place] [phone]                Hospital Course by problem list: 1. Scalp cellulitis: Patient is a 46 yo female with DM2, left BKA due to staph infection, HTN, and HLD who presented to the ED with worsening scalp edema, erythema, pain and abscesses. Lori Schmidt attempted to treat herself with Cipro outpatient but went to urgent care once her symptoms did not improve. There, Lori Schmidt received one dose of IV ceftriaxone. At ED presentation, Lori Schmidt had also developed swelling of her right ear which propagated to face and around eye on right. Lori Schmidt stated Lori Schmidt had had boils in the past in groin and also on scalp. Lori Schmidt had surgery to remove cysts at right axilla as well. Lori Schmidt has never been followed by dermatology and is unaware of any previous diagnosis of hidradenitis suppurativa.   Lori Schmidt was found to have mild leukocytosis in ED, underwent I&D for abscess on posterior head and was placed on Vancomycin and Rocephin. CT head showed multiple subcutaneous abscesses, negative for brain abscess or intracranial abnormality. CT maxillofacial showed subcutaneous induration with the most significant being superior to the left ear. No findings suggesting osteomyelitis or meningitis. No evidence of systemic infection. MRSA nasal swab negative. Transitioned to IV cefazolin after two days of vancomycin. Received 2 days of cefazolin. Got 4 days total of IV abx for her infection before transitioning to doxycycline to be continued outpatient. WBC wnl and blood cultures NGTD. Patient treated with Tylenol and oxycodone prn for pain but patient was not requiring this toward end of admission. Two of the abscesses  "popped" overnight and Lori Schmidt endorsed pain relief with this. Lori Schmidt endorses much improvement in pain overall since abx treatment began.  We discussed the possible diagnosis of hidradenitis suppurativa given recurrent skin infections with presence in common areas of axilla and groin. Although appears to be less common, HS can occur on the scalp. Lori Schmidt was discharged with oral doxycycline to complete course for SSTI, continue warm compresses to encourage drainage of scalp lesions. We have prescribed doxycycline 100 mg daily following treatment course and recommend f/u with dermatology, specifically [facility] HS clinic, for further evaluation and confirmation of diagnosis.  Discharge subjective: Lori Schmidt is feeling much better today and had already packed to go home when we came to visit her. Lori Schmidt states Lori Schmidt does not need pain medicines anymore. A couple of her boils have popped but are still present. Lori Schmidt is not currently sexually active but Lori Schmidt gets injections for contraception.   Discharge Exam:   BP 117/70 (BP Location: Left Arm)   Pulse [facility])  103   Temp 98.7 F (37.1 C) (Oral)   Resp 17   Wt 121.9 kg   SpO2 99%   BMI 42.09 kg/m   Discharge exam:  Physical Exam Constitutional:      General: Lori Schmidt is not in acute distress.    Appearance: Normal appearance.  HENT:     Head: Normocephalic and atraumatic.  Pulmonary:     Effort: Pulmonary effort is normal.  Skin:    Comments: Multiple abscesses at scalp with erythema. Left preauricular erythema with abscess draining fluid.  Neurological:     General: No focal deficit present.     Mental Status: Lori Schmidt is alert.    Pertinent Labs, Studies, and Procedures:     Latest Ref Rng & Units 10/19/2021    1:01 AM 10/18/2021    1:09 AM 10/17/2021    5:17 AM  CBC  WBC 4.0 - 10.5 K/uL 7.1  9.6  11.2   Hemoglobin 12.0 - 15.0 g/dL 59.2  92.4  46.2   Hematocrit 36.0 - 46.0 % 34.4  34.1  34.8   Platelets 150 - 400 K/uL 265  234  212    Lab Results  Component  Value Date   HGBA1C 10.1 (H) 10/18/2021    Discharge Instructions: Discharge Instructions     Discharge instructions   Complete by: As directed    Lori Schmidt,  It was a pleasure caring for you during your admission here at [facility]. You were found to have a bacterial infection called cellulitis on your scalp. We treated you with IV antibiotics and are sending you home with oral antibiotics, doxycycline. Beginning tomorrow, please take one capsule twice a day for five days and one capsule once a day thereafter. This has been sent to your pharmacy at [facility]. -Please follow up in one week at our Internal Medicine Clinic. Someone will call you to schedule this appointment. -Please take your antibiotic per instructions above       Signed: Adron Bene, MD 10/19/2021, 1:57 PM   Pager: [phone]

## 2021-10-19 NOTE — Progress Notes (Signed)
Discharge AVS printed and reviewed with patient. New medications reviewed, all questions concerns addressed.  Peripheral IV removed. Patient discharged home via private auto

## 2021-10-19 NOTE — Progress Notes (Signed)
Mobility Specialist Progress Note:   10/19/21 0900  Mobility  Activity Ambulated independently in hallway  Level of Assistance Independent  Assistive Device None  Distance Ambulated (ft) 550 ft  Activity Response Tolerated well  $Mobility charge 1 Mobility   Pt eager for mobility session. No physical assist required. Pt left sitting EOB with all needs met.   Nelta Numbers Acute Rehab Secure Chat or Office Phone: [phone]

## 2021-10-19 NOTE — Progress Notes (Signed)
Inpatient Diabetes Program Recommendations  AACE/ADA: New Consensus Statement on Inpatient Glycemic Control (2015)  Target Ranges:  Prepandial:   less than 140 mg/dL      Peak postprandial:   less than 180 mg/dL (1-2 hours)      Critically ill patients:  140 - 180 mg/dL   Lab Results  Component Value Date   GLUCAP 235 (H) 10/19/2021   HGBA1C 10.1 (H) 10/18/2021    Review of Glycemic Control  Latest Reference Range & Units 10/17/21 21:23 10/18/21 07:52 10/18/21 11:27 10/18/21 17:03 10/18/21 21:49 10/19/21 08:21  Glucose-Capillary 70 - 99 mg/dL 981 (H) 191 (H) 478 (H) 345 (H) 240 (H) 235 (H)  Diabetes history: DM 2 Outpatient Diabetes medications:  Farxiga 5 mg daily, Levemir 55 units q HS, Humalog 8 units tid with meals (not taking), Metformin 1000 mg bid (not taking) Current orders for Inpatient glycemic control:  Novolog 0-20 units tid with meals and HS Levemir 30 units bid  Inpatient Diabetes Program Recommendations:    Note plans for patient to d/c home today.  Spoke to patient at bedside regarding DM management.  Patient states that she is taking Levemir 40 units bid at home and is not taking Humalog.  We discussed her current A1C and increase.  She states that she has been working really hard to get blood sugars under control at home however she has been under a lot of extra stress with work and recent amputation in the past 8 months.  We discussed that she likely needs changes in her medications and that keeping blood sugars <180 mg/dL is important to help infection heal.  Patient has freestyle libre at home and plans to put in back on when she gets home.  She may benefit from seeing Endocrinologist as well.  Patient appreciative of visit.  She states her current PCP is leaving and she may go to another practice.   Encouraged close follow-up.   Thanks,  Beryl Meager, RN, BC-ADM Inpatient Diabetes Coordinator Pager [phone]  (8a-5p)

## 2021-10-21 LAB — CULTURE, BLOOD (ROUTINE X 2)
Culture: NO GROWTH
Culture: NO GROWTH

## 2021-10-26 ENCOUNTER — Ambulatory Visit: Payer: Managed Care, Other (non HMO) | Admitting: Student

## 2021-10-26 ENCOUNTER — Encounter: Payer: Self-pay | Admitting: Student

## 2021-10-26 VITALS — BP 154/83 | HR 117 | Temp 98.0°F | Ht 67.0 in | Wt 272.9 lb

## 2021-10-26 DIAGNOSIS — Z794 Long term (current) use of insulin: Secondary | ICD-10-CM

## 2021-10-26 DIAGNOSIS — E1169 Type 2 diabetes mellitus with other specified complication: Secondary | ICD-10-CM | POA: Diagnosis not present

## 2021-10-26 DIAGNOSIS — Z7689 Persons encountering health services in other specified circumstances: Secondary | ICD-10-CM

## 2021-10-26 DIAGNOSIS — L02811 Cutaneous abscess of head [any part, except face]: Secondary | ICD-10-CM | POA: Diagnosis not present

## 2021-10-26 DIAGNOSIS — F419 Anxiety disorder, unspecified: Secondary | ICD-10-CM | POA: Insufficient documentation

## 2021-10-26 MED ORDER — HYDROXYZINE HCL 10 MG PO TABS: 10.0000 mg | ORAL_TABLET | Freq: Three times a day (TID) | ORAL | 0 refills | Status: DC | PRN

## 2021-10-26 NOTE — Patient Instructions (Addendum)
Thank you, Ms.Rafaela Dinius for allowing [place] to provide your care today. Today we discussed your scalp abscess, diabetes and anxiety.  Abscess -appears to be improving since hospital discharge -continue your Doxycycline  -referral to local Dermatology and [facility] Hidradenitis Suppurativa clinic  Diabetes -have your Dexcom placed so that at the next visit we can look at your blood sugar  -continue your Levemir, Januvia and Marcelline Deist -continue your dietary modifications -monitor for any hypoglycemia symptoms  Anxiety -prescription for Hydroxyzine 10 mg as needed     I have ordered the following labs for you:  Lab Orders         CBC with Diff       Referrals ordered today:   Referral Orders         Ambulatory referral to Dermatology      I have ordered the following medication/changed the following medications:   Stop the following medications: There are no discontinued medications.   Start the following medications: Meds ordered this encounter  Medications   hydrOXYzine (ATARAX) 10 MG tablet    Sig: Take 1 tablet (10 mg total) by mouth 3 (three) times daily as needed.    Dispense:  30 tablet    Refill:  0     Follow up:  2 weeks     Should you have any questions or concerns please call the internal medicine clinic at [phone].    Rana Snare, D.O. [facility] Internal Medicine Center

## 2021-10-26 NOTE — Assessment & Plan Note (Signed)
Patient presents today for hospital follow-up. She was treated for her scalp abscesses and cellulitis. Currently on doxycycline, tolerating it well and reports feeling better after discharge. She reports that the 2 abscesses have clear drainage. They are nontender and less red at this visit. Her cellulitis has improved since hospital admission. She is concerned by the drainage and the abscesses not fully resolving. Denies fever, chills, nausea, vomiting, ear pain.   History of abscesses in her right armpit and groin folds could suggest hidradenitis suppurativa.  Patient would benefit from referral to dermatology and HS clinic at [facility].   Plan -Continue doxycycline -Referral to local dermatology and [facility] hidradenitis suppurativa clinic -CBC today

## 2021-10-26 NOTE — Assessment & Plan Note (Signed)
Patient would like to switch PCP to [facility] and establish care here at the [facility]. She normally receives her Depo-Provera shot from her PCP at [facility]. I told her we would be happy to provide care for her at the clinic.

## 2021-10-26 NOTE — Assessment & Plan Note (Signed)
A1c was 10.1% during hospital admission.  She is taking Levemir 40 units BID (she reports this as her home dose prior to hospital admit), Januvia 100 mg and Farxiga 5 mg. Stopped taking metformin due to persistent diarrhea throughout the day. Not taking Humalog due to financial burden. She plans to pick up her Dexcom.  Advised patient to have that on before next visit so that we can review her daily blood glucose. Denies hypoglycemic symptoms.  She is modifying her diet and switching to diet sodas.   Plan -Continue Levemir, Januvia, and Farxiga -Continue lifestyle modifications -Advised patient to have Dexcom for next visit -f/u in 2 weeks

## 2021-10-26 NOTE — Progress Notes (Signed)
CC: Hospital f/u for scalp abscesses  HPI:  [name] is a 46 y.o. female living with a history stated below and presents today for hospital f/u for scalp abscesses. Please see problem based assessment and plan for additional details.  Past Medical History:  Diagnosis Date   Hyperlipidemia    Hypertension    Obesity    Type 2 diabetes mellitus (HCC)     Current Outpatient Medications on File Prior to Visit  Medication Sig Dispense Refill   acetaminophen (TYLENOL) 325 MG tablet Take 1-2 tablets (325-650 mg total) by mouth every 6 (six) hours as needed for mild pain (pain score 1-3 or temp > 100.5).     ascorbic acid (VITAMIN C) 1000 MG tablet Take 1 tablet (1,000 mg total) by mouth daily. (Patient not taking: Reported on 10/17/2021) 30 tablet 0   citalopram (CELEXA) 40 MG tablet Take 1 tablet (40 mg total) by mouth at bedtime. 30 tablet 0   cyanocobalamin 1000 MCG tablet Take 1/2 tablet (500 mcg total) by mouth daily. 15 tablet 0   dapagliflozin propanediol (FARXIGA) 5 MG TABS tablet Take 1 tablet (5 mg total) by mouth daily before breakfast. (Patient not taking: Reported on 08/17/2021) 30 tablet 0   diclofenac Sodium (VOLTAREN) 1 % GEL Apply 2 g topically 4 (four) times daily. (Patient not taking: Reported on 04/26/2021) 100 g 0   docusate sodium (COLACE) 100 MG capsule Take 1 capsule (100 mg total) by mouth daily. (Patient not taking: Reported on 10/17/2021) 10 capsule 0   insulin detemir (LEVEMIR) 100 UNIT/ML FlexPen Inject 55 Units into the skin at bedtime. 15 mL 11   insulin lispro (HUMALOG) 100 UNIT/ML KwikPen Inject 8 Units into the skin 3 (three) times daily with meals. (Patient not taking: Reported on 10/17/2021) 15 mL 11   losartan (COZAAR) 25 MG tablet Take 1 tablet (25 mg total) by mouth at bedtime. 30 tablet 0   magnesium oxide (MAG-OX) 400 MG tablet Take 1 tablet (400 mg total) by mouth daily. 30 tablet 0   medroxyPROGESTERone Acetate (DEPO-PROVERA IM) Inject into  the muscle every 3 (three) months. (Patient not taking: Reported on 08/17/2021)     metFORMIN (GLUCOPHAGE) 1000 MG tablet Take 1 tablet (1,000 mg total) by mouth 2 (two) times daily with a meal. (Patient not taking: Reported on 10/17/2021) 60 tablet 0   methocarbamol (ROBAXIN) 750 MG tablet Take 1 tablet (750 mg total) by mouth every 6 (six) hours. (Patient not taking: Reported on 04/26/2021) 60 tablet 0   pantoprazole (PROTONIX) 40 MG tablet Take 1 tablet (40 mg total) by mouth daily. (Patient not taking: Reported on 08/17/2021) 30 tablet 0   pravastatin (PRAVACHOL) 40 MG tablet Take 1 tablet (40 mg total) by mouth daily. 30 tablet 0   pregabalin (LYRICA) 100 MG capsule Take 1 capsule (100 mg) by mouth twice daily and 2 capsules (200 mg total) nightly (Patient not taking: Reported on 08/17/2021) 120 capsule 0   sitaGLIPtin (JANUVIA) 100 MG tablet Take by mouth.     No current facility-administered medications on file prior to visit.    Review of Systems: ROS negative except for what is noted on the assessment and plan.  Vitals:   10/26/21 1439 10/26/21 1513  BP: (!) 152/122 (!) 154/83  Pulse: (!) 122 (!) 117  Temp: 98 F (36.7 C)   TempSrc: Oral   SpO2: 99%   Weight: 272 lb 14.4 oz (123.8 kg)   Height: 5\' 7"  (1.702  m)    Physical Exam Constitutional:      General: She is not in acute distress.    Appearance: Normal appearance. She is not ill-appearing.  HENT:     Head:     Comments: Head: 2 abscesses superior to left ear and posterior of head. Fluctuant, non-tender with little surrounding erythema. Minimal left facial swelling.     Right Ear: External ear normal.     Left Ear: External ear normal.  Cardiovascular:     Rate and Rhythm: Regular rhythm. Tachycardia present.     Pulses:          Radial pulses are 2+ on the right side and 2+ on the left side.  Pulmonary:     Effort: Pulmonary effort is normal.     Breath sounds: Normal breath sounds.  Musculoskeletal:     Left Lower  Extremity: Left leg is amputated below knee.  Neurological:     Mental Status: She is alert.  Psychiatric:        Mood and Affect: Mood is anxious.        Behavior: Behavior normal.      Assessment & Plan:   Abscess of skin and subcutaneous tissue Patient presents today for hospital follow-up. She was treated for her scalp abscesses and cellulitis. Currently on doxycycline, tolerating it well and reports feeling better after discharge. She reports that the 2 abscesses have clear drainage. They are nontender and less red at this visit. Her cellulitis has improved since hospital admission. She is concerned by the drainage and the abscesses not fully resolving. Denies fever, chills, nausea, vomiting, ear pain.   History of abscesses in her right armpit and groin folds could suggest hidradenitis suppurativa.  Patient would benefit from referral to dermatology and HS clinic at [facility].   Plan -Continue doxycycline -Referral to local dermatology and [facility] hidradenitis suppurativa clinic -CBC today  Diabetes mellitus (HCC) A1c was 10.1% during hospital admission.  She is taking Levemir 40 units BID (she reports this as her home dose prior to hospital admit), Januvia 100 mg and Farxiga 5 mg. Stopped taking metformin due to persistent diarrhea throughout the day. Not taking Humalog due to financial burden. She plans to pick up her Dexcom.  Advised patient to have that on before next visit so that we can review her daily blood glucose. Denies hypoglycemic symptoms.  She is modifying her diet and switching to diet sodas.   Plan -Continue Levemir, Januvia, and Farxiga -Continue lifestyle modifications -Advised patient to have Dexcom for next visit -f/u in 2 weeks  Anxiety Patient reports increased stress due to recent hospital admission, chronic health conditions and work related issues. She feels at times frustrated and overwhelmed.  She is on Celexa 40 mg. Has tried therapy through her insurance which  she states did not help.   Plan -Continue Celexa -Prescription for hydroxyzine 10 mg as needed  Encounter to establish care Patient would like to switch PCP to [facility] and establish care here at the [facility]. She normally receives her Depo-Provera shot from her PCP at [facility]. I told her we would be happy to provide care for her at the clinic.    Patient seen with Dr. Marin Roberts, D.O. [facility] Internal Medicine, PGY-1 Phone: [phone] Date 10/26/2021 Time 7:55 PM

## 2021-10-26 NOTE — Assessment & Plan Note (Signed)
Patient reports increased stress due to recent hospital admission, chronic health conditions and work related issues. She feels at times frustrated and overwhelmed.  She is on Celexa 40 mg. Has tried therapy through her insurance which she states did not help.   Plan -Continue Celexa -Prescription for hydroxyzine 10 mg as needed

## 2021-10-27 LAB — CBC WITH DIFFERENTIAL/PLATELET
Basophils Absolute: 0 10*3/uL (ref 0.0–0.2)
Basos: 0 %
EOS (ABSOLUTE): 0.1 10*3/uL (ref 0.0–0.4)
Eos: 1 %
Hematocrit: 41 % (ref 34.0–46.6)
Hemoglobin: 13.2 g/dL (ref 11.1–15.9)
Immature Grans (Abs): 0 10*3/uL (ref 0.0–0.1)
Immature Granulocytes: 0 %
Lymphocytes Absolute: 2.5 10*3/uL (ref 0.7–3.1)
Lymphs: 23 %
MCH: 28.1 pg (ref 26.6–33.0)
MCHC: 32.2 g/dL (ref 31.5–35.7)
MCV: 87 fL (ref 79–97)
Monocytes Absolute: 0.7 10*3/uL (ref 0.1–0.9)
Monocytes: 6 %
Neutrophils Absolute: 7.7 10*3/uL — ABNORMAL HIGH (ref 1.4–7.0)
Neutrophils: 70 %
Platelets: 342 10*3/uL (ref 150–450)
RBC: 4.7 x10E6/uL (ref 3.77–5.28)
RDW: 11.8 % (ref 11.7–15.4)
WBC: 11.1 10*3/uL — ABNORMAL HIGH (ref 3.4–10.8)

## 2021-10-28 NOTE — Progress Notes (Signed)
Internal Medicine Clinic Attending  I saw and evaluated the patient.  I personally confirmed the key portions of the history and exam documented by Dr. Sherrilee Gilles and I reviewed pertinent patient test results.  The assessment, diagnosis, and plan were formulated together and I agree with the documentation in the resident's note. Improvement in cellulitis and abscesses from recent hospitalization. Ms. Aspinwall has completed doxycycline treatment course and remains on suppressive therapy. Referral to local dermatology and [facility] HS clinic placed today. We discussed referral to general surgery for discussion of further drainage for persistent fluid collection, however she declines. She will need continued f/u for DM control. We discussed referral to [facility] for nutrition counseling and diabetes education, however she declines. She will place her Dexcom soon and return to clinic in 2 weeks for review and medication management. She has hypoglycemic awareness but denies any hypoglycemic symptoms with current regimen. We discussed referral for counseling for anxiety, she declines at this time. Trial of hydroxyzine prn for anxiety flares and may consider addition of buspirone at f/u visit if not well controlled.

## 2021-11-10 ENCOUNTER — Encounter: Payer: Self-pay | Admitting: Internal Medicine

## 2021-11-10 ENCOUNTER — Ambulatory Visit: Payer: Managed Care, Other (non HMO) | Admitting: Internal Medicine

## 2021-11-10 ENCOUNTER — Other Ambulatory Visit: Payer: Self-pay

## 2021-11-10 VITALS — BP 125/74 | HR 97 | Temp 97.9°F | Resp 20 | Ht 67.0 in | Wt 274.8 lb

## 2021-11-10 DIAGNOSIS — L02811 Cutaneous abscess of head [any part, except face]: Secondary | ICD-10-CM

## 2021-11-10 DIAGNOSIS — Z794 Long term (current) use of insulin: Secondary | ICD-10-CM | POA: Diagnosis not present

## 2021-11-10 DIAGNOSIS — Z30013 Encounter for initial prescription of injectable contraceptive: Secondary | ICD-10-CM

## 2021-11-10 DIAGNOSIS — Z7984 Long term (current) use of oral hypoglycemic drugs: Secondary | ICD-10-CM

## 2021-11-10 DIAGNOSIS — Z3202 Encounter for pregnancy test, result negative: Secondary | ICD-10-CM

## 2021-11-10 DIAGNOSIS — Z3042 Encounter for surveillance of injectable contraceptive: Secondary | ICD-10-CM

## 2021-11-10 DIAGNOSIS — E119 Type 2 diabetes mellitus without complications: Secondary | ICD-10-CM | POA: Diagnosis not present

## 2021-11-10 DIAGNOSIS — E1169 Type 2 diabetes mellitus with other specified complication: Secondary | ICD-10-CM

## 2021-11-10 DIAGNOSIS — Z309 Encounter for contraceptive management, unspecified: Secondary | ICD-10-CM | POA: Insufficient documentation

## 2021-11-10 LAB — POCT URINE PREGNANCY: Preg Test, Ur: NEGATIVE

## 2021-11-10 MED ORDER — MEDROXYPROGESTERONE ACETATE 104 MG/0.65ML ~~LOC~~ SUSY
104.0000 mg | PREFILLED_SYRINGE | Freq: Once | SUBCUTANEOUS | Status: AC
  Administered 2021-11-10: 104 mg via SUBCUTANEOUS

## 2021-11-10 MED ORDER — DOXYCYCLINE HYCLATE 100 MG PO CAPS: 100.0000 mg | ORAL_CAPSULE | Freq: Two times a day (BID) | ORAL | 0 refills | Status: DC

## 2021-11-10 NOTE — Patient Instructions (Signed)
Thank you, Ms.Dareen Gutzwiller for allowing [place] to provide your care today. Today we discussed:  Abscess: Please take doxycycline 100 mg twice a day for 7 days. I would like to see you back in 1 week so we can make sure it is getting better. Here is the information for the dermatologist:  [facility] at [facility] in [place], [place] Get online care: [email or web address] Address: [address] [place], [place] [address] Phone: [phone] 2. Diabetes: Continue to take Levemir 40 units twice a day, Januvia, and [place]. Please put your dexcom on, as we will need to start a meal time insulin at your next visit  I have ordered the following labs for you:  Lab Orders  No laboratory test(s) ordered today      Referrals ordered today:   Referral Orders  No referral(s) requested today     I have ordered the following medication/changed the following medications:   Stop the following medications: Medications Discontinued During This Encounter  Medication Reason   insulin lispro (HUMALOG) 100 UNIT/ML KwikPen      Start the following medications: Meds ordered this encounter  Medications   doxycycline (VIBRAMYCIN) 100 MG capsule    Sig: Take 1 capsule (100 mg total) by mouth 2 (two) times daily for 7 days.    Dispense:  14 capsule    Refill:  0     Follow up:  1 week for abscess follow up      Should you have any questions or concerns please call the internal medicine clinic at [phone].     Elza Rafter, D.O. [facility] Internal Medicine Center

## 2021-11-10 NOTE — Progress Notes (Signed)
Internal Medicine Clinic Attending  Case discussed with Dr. Ned Card  At the time of the visit.  We reviewed the resident's history and exam and pertinent patient test results.  I agree with the assessment, diagnosis, and plan of care documented in the resident's note.   We offered an I&D of the abscess today, but patient is not having much local pain. May be able to improve with antibiotics and supportive care alone. If pain/tenderness worsens she will come back. We discussed how uncontrolled diabetes is putting her at risk for recurrent skin infections and wound complications.

## 2021-11-10 NOTE — Assessment & Plan Note (Addendum)
The patient presents today for a 2 week follow up of her scalp abscess (see media). She completed her course of doxycyline after she was hospitalized and has now had a new abscess develop over the last couple of weeks that is on the top of her scalp. It is nontender, however, it is very erythematous and the abscess has come to a head, so I suspect it may drain soon. The patient is not having any pain at this time, but states that the abscess is uncomfortable.   The patient was referred to the [facility] hidradenitis suppurativa clinic the last time she was here, however, she has not gotten a call from them. I provided the patient with the phone number to the clinic today.   Plan: - Referral to [facility] HS clinic - Doxycyline 100 mg bid x 7 days - Return in 1 week for follow up; consider I&D at that time if not improved or worsened

## 2021-11-10 NOTE — Progress Notes (Signed)
CC: diabetes and abscess follow up  HPI:  [name] is a 46 y.o. female living with a history stated below and presents today for a follow up of her diabetes and scalp abscess. Please see problem based assessment and plan for additional details.  Past Medical History:  Diagnosis Date   Hyperlipidemia    Hypertension    Obesity    Type 2 diabetes mellitus ([place])     Current Outpatient Medications on File Prior to Visit  Medication Sig Dispense Refill   acetaminophen (TYLENOL) 325 MG tablet Take 1-2 tablets (325-650 mg total) by mouth every 6 (six) hours as needed for mild pain (pain score 1-3 or temp > 100.5).     ascorbic acid (VITAMIN C) 1000 MG tablet Take 1 tablet (1,000 mg total) by mouth daily. (Patient not taking: Reported on 10/17/2021) 30 tablet 0   citalopram (CELEXA) 40 MG tablet Take 1 tablet (40 mg total) by mouth at bedtime. 30 tablet 0   cyanocobalamin 1000 MCG tablet Take 1/2 tablet (500 mcg total) by mouth daily. 15 tablet 0   dapagliflozin propanediol (FARXIGA) 5 MG TABS tablet Take 1 tablet (5 mg total) by mouth daily before breakfast. (Patient not taking: Reported on 08/17/2021) 30 tablet 0   diclofenac Sodium (VOLTAREN) 1 % GEL Apply 2 g topically 4 (four) times daily. (Patient not taking: Reported on 04/26/2021) 100 g 0   docusate sodium (COLACE) 100 MG capsule Take 1 capsule (100 mg total) by mouth daily. (Patient not taking: Reported on 10/17/2021) 10 capsule 0   hydrOXYzine (ATARAX) 10 MG tablet Take 1 tablet (10 mg total) by mouth 3 (three) times daily as needed. 30 tablet 0   insulin detemir (LEVEMIR) 100 UNIT/ML FlexPen Inject 55 Units into the skin at bedtime. 15 mL 11   losartan (COZAAR) 25 MG tablet Take 1 tablet (25 mg total) by mouth at bedtime. 30 tablet 0   magnesium oxide (MAG-OX) 400 MG tablet Take 1 tablet (400 mg total) by mouth daily. 30 tablet 0   medroxyPROGESTERone Acetate (DEPO-PROVERA IM) Inject into the muscle every 3 (three) months.  (Patient not taking: Reported on 08/17/2021)     metFORMIN (GLUCOPHAGE) 1000 MG tablet Take 1 tablet (1,000 mg total) by mouth 2 (two) times daily with a meal. (Patient not taking: Reported on 10/17/2021) 60 tablet 0   methocarbamol (ROBAXIN) 750 MG tablet Take 1 tablet (750 mg total) by mouth every 6 (six) hours. (Patient not taking: Reported on 04/26/2021) 60 tablet 0   pantoprazole (PROTONIX) 40 MG tablet Take 1 tablet (40 mg total) by mouth daily. (Patient not taking: Reported on 08/17/2021) 30 tablet 0   pravastatin (PRAVACHOL) 40 MG tablet Take 1 tablet (40 mg total) by mouth daily. 30 tablet 0   pregabalin (LYRICA) 100 MG capsule Take 1 capsule (100 mg) by mouth twice daily and 2 capsules (200 mg total) nightly (Patient not taking: Reported on 08/17/2021) 120 capsule 0   sitaGLIPtin (JANUVIA) 100 MG tablet Take by mouth.     No current facility-administered medications on file prior to visit.    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 Mother is Tally Due   Social Determinants of Health   Financial Resource Strain: Not on file  Food Insecurity: Not on file  Transportation Needs: Not on file   Physical Activity: Not on file  Stress: Not on file  Social Connections: Not on file  Intimate Partner Violence: Not on file    Review of Systems: ROS negative except for what is noted on the assessment and plan.  Vitals:   11/10/21 0831 11/10/21 0833  BP:  125/74  Pulse:  97  Resp:  20  Temp:  97.9 F (36.6 C)  TempSrc:  Oral  SpO2:  100%  Weight: 274 lb 12.8 oz (124.6 kg) 274 lb 12.8 oz (124.6 kg)  Height:  5\' 7"  (1.702 m)    Physical Exam: Constitutional: well-appearing female sitting in chair, in no acute distress Cardiovascular: regular rate and rhythm, no m/r/g Pulmonary/Chest: normal work of breathing on room air, lungs clear to auscultation bilaterally Abdominal: soft, non-tender, obese abdomen MSK: s/p left BKA Neurological: alert & oriented x 3, no focal deficit Skin: warm and dry, scalp abscess is erythematous, nontender to palpation and non firm. Psych: normal mood and behavior    Assessment & Plan:    Patient seen with Dr.  Diabetes mellitus (HCC) A1c 10.1% in July. The patient is on Levemir 40U bid, januvia 100 mg daily, and farxiga 5 mg daily. She has not been able to get on Humalog due to the financial burden. Additionally, the patient has yet to place her Dexcom and has not been checking her sugar levels. I advised the patient that we may need to consider mealtime insulin at her next visit in 1 week when she has her dexcom on, as that will help to promote wound healing (see abscess A/P).  Plan: - Continue Levemir, Januvia, and Farxiga - Consider mealtime insulin at next visit  - Have Dexcom for next visit  Abscess of skin and subcutaneous tissue The patient presents today for a 2 week follow up of her scalp abscess (see media). She completed her course of doxycyline after she was hospitalized and has now had a new abscess develop over the last couple of weeks that is on the top of her scalp. It is nontender, however, it is very erythematous  and the abscess has come to a head, so I suspect it may drain soon. The patient is not having any pain at this time, but states that the abscess is uncomfortable.   The patient was referred to the [facility] hidradenitis suppurativa clinic the last time she was here, however, she has not gotten a call from them. I provided the patient with the phone number to the clinic today.   Plan: - Referral to [facility] HS clinic - Doxycyline 100 mg bid x 7 days - Return in 1 week for follow up; consider  I&D at that time if not improved or worsened  Encounter for birth control Patient given depo-provera shot today after negative urine pregnancy test. Continue q3 months.   Elza Rafter, D.O. [facility] Internal Medicine, PGY-2 Phone: [phone] Date 11/10/2021 Time 1:43 PM

## 2021-11-10 NOTE — Assessment & Plan Note (Signed)
A1c 10.1% in July. The patient is on Levemir 40U bid, januvia 100 mg daily, and farxiga 5 mg daily. She has not been able to get on Humalog due to the financial burden. Additionally, the patient has yet to place her Dexcom and has not been checking her sugar levels. I advised the patient that we may need to consider mealtime insulin at her next visit in 1 week when she has her dexcom on, as that will help to promote wound healing (see abscess A/P).  Plan: - Continue Levemir, Januvia, and Farxiga - Consider mealtime insulin at next visit  - Have Dexcom for next visit

## 2021-11-10 NOTE — Assessment & Plan Note (Signed)
Patient given depo-provera shot today after negative urine pregnancy test. Continue q3 months.

## 2021-11-17 ENCOUNTER — Other Ambulatory Visit: Payer: Self-pay

## 2021-11-17 ENCOUNTER — Encounter: Payer: Self-pay | Admitting: Internal Medicine

## 2021-11-17 ENCOUNTER — Ambulatory Visit: Payer: Managed Care, Other (non HMO) | Admitting: Internal Medicine

## 2021-11-17 VITALS — BP 118/72 | HR 102 | Temp 98.2°F | Ht 67.0 in | Wt 273.7 lb

## 2021-11-17 DIAGNOSIS — Z794 Long term (current) use of insulin: Secondary | ICD-10-CM | POA: Diagnosis not present

## 2021-11-17 DIAGNOSIS — E1169 Type 2 diabetes mellitus with other specified complication: Secondary | ICD-10-CM

## 2021-11-17 DIAGNOSIS — L02811 Cutaneous abscess of head [any part, except face]: Secondary | ICD-10-CM | POA: Diagnosis not present

## 2021-11-17 DIAGNOSIS — R600 Localized edema: Secondary | ICD-10-CM | POA: Insufficient documentation

## 2021-11-17 DIAGNOSIS — Z7984 Long term (current) use of oral hypoglycemic drugs: Secondary | ICD-10-CM

## 2021-11-17 LAB — BRAIN NATRIURETIC PEPTIDE: B Natriuretic Peptide: 7.4 pg/mL (ref 0.0–100.0)

## 2021-11-17 MED ORDER — DOXYCYCLINE HYCLATE 100 MG PO CAPS: 100.0000 mg | ORAL_CAPSULE | Freq: Two times a day (BID) | ORAL | 0 refills | Status: AC

## 2021-11-17 NOTE — Assessment & Plan Note (Signed)
The patient presents today for 1 week follow-up of her scalp abscess (see media).  She completed her course of doxycycline 100 mg twice daily x 7 days and also states that she "popped" the abscess and a clear fluid came out.  At her previous visit, we offer the patient an I&D, but she declined, as the lesion was not painful or bothersome to her. The abscess is still present, however, it is smaller in size compared to the previous visit 1 week ago.  It is nontender, but remains very erythematous and is still fluctuant.  The patient has not called the [facility] hidradenitis suppurativa clinic yet, as she states that work was busy last week.  Seeing as the lesion has not completely healed, we will try another cycle of antibiotics.  Plan: - Doxycyline 100 mg bid x 7 days - Strongly recommended patient to call [facility] HS clinic (provided with phone number again) - Return in 2 weeks for follow up

## 2021-11-17 NOTE — Assessment & Plan Note (Addendum)
The patient is concerned today as she has had 3 weeks of right lower extremity swelling.  She is on her feet a lot at work and states that she elevates her legs in the evenings, but the swelling does not ever completely go away.  Her leg is not painful, nor are there any cuts/scrapes/open sores.  She does not have any swelling elsewhere.  Patient denies any recent travel and states that she lives a fairly active lifestyle, however, with her being on the Depo-Provera injections, I am concerned she could have a DVT, although she does not have any tenderness in her calf.  Additionally with her history of uncontrolled diabetes, I wonder if she could have a diabetic nephropathy contributing to her leg swelling. She had an echo in November 2022 which showed a normal ejection fraction of 60 to 65% and no diastolic dysfunction, thus making CHF a less likely cause of her edema- we will hold off on obtaining a new echocardiogram at this time.   Plan: - STAT Ultrasound to rule out DVT of RLE - BMP, BNP, urine microalbumin today

## 2021-11-17 NOTE — Patient Instructions (Signed)
Thank you, Ms.Fiana Gladu for allowing [place] to provide your care today. Today we discussed:  Scalp abscess: Please take another course of doxycycline twice a day for 7 days. Please also call the [facility] clinic to make an appt [facility] at [facility] in [place], [place] Get online care: [email or web address] Address: [address] [place], [place] [address] Phone: [phone]  Leg swelling: We are doing some blood work today and getting an ultrasound of your leg to rule out a blood clot. I will call you with all of these results  Diabetes: I have placed a referral to our diabetes educator/coordinator   I have ordered the following labs for you:   Lab Orders         BMP8+Anion Gap         Brain natriuretic peptide         Microalbumin / Creatinine Urine Ratio      Tests ordered today:  Right leg ultrasound  Referrals ordered today:   Referral Orders  No referral(s) requested today     I have ordered the following medication/changed the following medications:   Stop the following medications: Medications Discontinued During This Encounter  Medication Reason   doxycycline (VIBRAMYCIN) 100 MG capsule Reorder     Start the following medications: Meds ordered this encounter  Medications   doxycycline (VIBRAMYCIN) 100 MG capsule    Sig: Take 1 capsule (100 mg total) by mouth 2 (two) times daily for 7 days.    Dispense:  14 capsule    Refill:  0     Follow up:  2 weeks     Should you have any questions or concerns please call the internal medicine clinic at [phone].     Elza Rafter, D.O. [facility]

## 2021-11-17 NOTE — Progress Notes (Signed)
CC: 1 week abscess follow up  HPI:  Lori Schmidt is a 46 y.o. female living with a history stated below and presents today for a follow up of her scalp abscess. Please see problem based assessment and plan for additional details.  Past Medical History:  Diagnosis Date   Hyperlipidemia    Hypertension    Obesity    Type 2 diabetes mellitus (HCC)     Current Outpatient Medications on File Prior to Visit  Medication Sig Dispense Refill   acetaminophen (TYLENOL) 325 MG tablet Take 1-2 tablets (325-650 mg total) by mouth every 6 (six) hours as needed for mild pain (pain score 1-3 or temp > 100.5).     ascorbic acid (VITAMIN C) 1000 MG tablet Take 1 tablet (1,000 mg total) by mouth daily. (Patient not taking: Reported on 10/17/2021) 30 tablet 0   citalopram (CELEXA) 40 MG tablet Take 1 tablet (40 mg total) by mouth at bedtime. 30 tablet 0   cyanocobalamin 1000 MCG tablet Take 1/2 tablet (500 mcg total) by mouth daily. 15 tablet 0   dapagliflozin propanediol (FARXIGA) 5 MG TABS tablet Take 1 tablet (5 mg total) by mouth daily before breakfast. (Patient not taking: Reported on 08/17/2021) 30 tablet 0   diclofenac Sodium (VOLTAREN) 1 % GEL Apply 2 g topically 4 (four) times daily. (Patient not taking: Reported on 04/26/2021) 100 g 0   docusate sodium (COLACE) 100 MG capsule Take 1 capsule (100 mg total) by mouth daily. (Patient not taking: Reported on 10/17/2021) 10 capsule 0   hydrOXYzine (ATARAX) 10 MG tablet Take 1 tablet (10 mg total) by mouth 3 (three) times daily as needed. 30 tablet 0   insulin detemir (LEVEMIR) 100 UNIT/ML FlexPen Inject 55 Units into the skin at bedtime. 15 mL 11   losartan (COZAAR) 25 MG tablet Take 1 tablet (25 mg total) by mouth at bedtime. 30 tablet 0   magnesium oxide (MAG-OX) 400 MG tablet Take 1 tablet (400 mg total) by mouth daily. 30 tablet 0   medroxyPROGESTERone Acetate (DEPO-PROVERA IM) Inject into the muscle every 3 (three) months. (Patient not  taking: Reported on 08/17/2021)     metFORMIN (GLUCOPHAGE) 1000 MG tablet Take 1 tablet (1,000 mg total) by mouth 2 (two) times daily with a meal. (Patient not taking: Reported on 10/17/2021) 60 tablet 0   methocarbamol (ROBAXIN) 750 MG tablet Take 1 tablet (750 mg total) by mouth every 6 (six) hours. (Patient not taking: Reported on 04/26/2021) 60 tablet 0   pantoprazole (PROTONIX) 40 MG tablet Take 1 tablet (40 mg total) by mouth daily. (Patient not taking: Reported on 08/17/2021) 30 tablet 0   pravastatin (PRAVACHOL) 40 MG tablet Take 1 tablet (40 mg total) by mouth daily. 30 tablet 0   pregabalin (LYRICA) 100 MG capsule Take 1 capsule (100 mg) by mouth twice daily and 2 capsules (200 mg total) nightly (Patient not taking: Reported on 08/17/2021) 120 capsule 0   sitaGLIPtin (JANUVIA) 100 MG tablet Take by mouth.     No current facility-administered medications on file prior to visit.    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 Mother is T[place] Due   Social Determinants of Health   Financial Resource Strain: Not on file  Food Insecurity: Not on file  Transportation Needs: Not on file  Physical  Activity: Not on file  Stress: Not on file  Social Connections: Not on file  Intimate Partner Violence: Not on file    Review of Systems: ROS negative except for what is noted on the assessment and plan.  Vitals:   11/17/21 0844  BP: 118/72  Pulse: (!) 102  Temp: 98.2 F (36.8 C)  TempSrc: Oral  SpO2: 100%  Weight: 273 lb 11.2 oz (124.1 kg)  Height: 5\' 7"  (1.702 m)    Physical Exam: Constitutional: no acute distress Cardiovascular: regular rate and rhythm, no m/r/g Pulmonary/Chest: normal work of breathing on room air, lungs clear to auscultation bilaterally MSK: s/p [name], right lower extremity with 2+ nonpitting edema Neurological: alert & oriented x 3, no focal deficit Skin: warm and dry, scalp abscess is erythematous, edematous, nontender to palpation, and non-firm (improved compared to previous visit) Psych: normal mood and behavior  Assessment & Plan:     Patient discussed with Dr.  Abscess of skin and subcutaneous tissue The patient presents today for 1 week follow-up of her scalp abscess (see media).  She completed her course of doxycycline 100 mg twice daily x 7 days and also states that she "popped" the abscess and a clear fluid came out.  At her previous visit, we offer the patient an I&D, but she declined, as the lesion was not painful or bothersome to her. The abscess is still present, however, it is smaller in size compared to the previous visit 1 week ago.  It is nontender, but remains very erythematous and is still fluctuant.  The patient has not called the [facility] hidradenitis suppurativa clinic yet, as she states that work was busy last week.  Seeing as the lesion has not completely healed, we will try another cycle of antibiotics.  Plan: - Doxycyline 100 mg bid x 7 days - Strongly recommended patient to call [facility] HS clinic (provided with phone number again) - Return in 2 weeks for follow up  Leg edema, right The patient is concerned today as she has  had 3 weeks of right lower extremity swelling.  She is on her feet a lot at work and states that she elevates her legs in the evenings, but the swelling does not ever completely go away.  Her leg is not painful, nor are there any cuts/scrapes/open sores.  She does not have any swelling elsewhere.  Patient denies any recent travel and states that she lives a fairly active lifestyle, however, with her being on the Depo-Provera injections, I am concerned she could have a DVT, although she does not have any tenderness in her calf.  Additionally with her history of uncontrolled diabetes, I wonder if  she could have a diabetic nephropathy contributing to her leg swelling. She had an echo in November 2022 which showed a normal ejection fraction of 60 to 123456 and no diastolic dysfunction, thus making CHF a less likely cause of her edema- we will hold off on obtaining a new echocardiogram at this time.   Plan: - STAT Ultrasound to rule out DVT of RLE - BMP, BNP, urine microalbumin today   Talesha Ellithorpe, D.O. [place] [facility], PGY-2 Phone: [phone] Date 11/17/2021 Time 10:40 AM

## 2021-11-18 LAB — BMP8+ANION GAP
Anion Gap: 19 mmol/L — ABNORMAL HIGH (ref 10.0–18.0)
BUN/Creatinine Ratio: 33 — ABNORMAL HIGH (ref 9–23)
BUN: 13 mg/dL (ref 6–24)
CO2: 17 mmol/L — ABNORMAL LOW (ref 20–29)
Calcium: 8.8 mg/dL (ref 8.7–10.2)
Chloride: 99 mmol/L (ref 96–106)
Creatinine, Ser: 0.39 mg/dL — ABNORMAL LOW (ref 0.57–1.00)
Glucose: 297 mg/dL — ABNORMAL HIGH (ref 70–99)
Potassium: 4.2 mmol/L (ref 3.5–5.2)
Sodium: 135 mmol/L (ref 134–144)
eGFR: 124 mL/min/{1.73_m2} (ref >59)

## 2021-11-18 LAB — MICROALBUMIN / CREATININE URINE RATIO
Creatinine, Urine: 79.5 mg/dL
Microalb/Creat Ratio: 52 mg/g creat — ABNORMAL HIGH (ref 0–29)
Microalbumin, Urine: 41.7 ug/mL

## 2021-11-18 NOTE — Progress Notes (Signed)
Internal Medicine Clinic Attending ° °Case discussed with Dr. Atway  At the time of the visit.  We reviewed the resident’s history and exam and pertinent patient test results.  I agree with the assessment, diagnosis, and plan of care documented in the resident’s note.  °

## 2021-11-18 NOTE — Progress Notes (Signed)
Patient called about results - bicarb is low and anion gap is elevated, although I suspect this is a lab error and not true DKA. Called patient and she is not having any sxs of DKA: no SOB/fast breathing, fatigue, nausea, vomiting, polyuria, polydipsia, or appetite changes. Stressed the importance of adherence with her insulin and her diabetes regimen, as well as checking her blood sugars. Will recheck BMP at next office visit. Discussed case with Dr. Antony Contras.

## 2021-11-22 ENCOUNTER — Encounter: Payer: Self-pay | Admitting: Internal Medicine

## 2021-11-23 ENCOUNTER — Telehealth (HOSPITAL_BASED_OUTPATIENT_CLINIC_OR_DEPARTMENT_OTHER): Payer: Self-pay | Admitting: *Deleted

## 2021-11-23 NOTE — Telephone Encounter (Signed)
Left message for patient to call and discuss scheduling the Lower extremity venous doppler ordered by Dr. Earl Lagos

## 2021-11-26 ENCOUNTER — Ambulatory Visit (HOSPITAL_COMMUNITY)
Admission: RE | Admit: 2021-11-26 | Discharge: 2021-11-26 | Disposition: A | Discharge status: Home / Self Care | Payer: Managed Care, Other (non HMO) | Source: Ambulatory Visit | Attending: Cardiovascular Disease | Admitting: Cardiovascular Disease

## 2021-11-26 DIAGNOSIS — R600 Localized edema: Secondary | ICD-10-CM | POA: Diagnosis not present

## 2021-11-29 NOTE — Progress Notes (Unsigned)
   CC: Leg Swelling   HPI:   Lori Schmidt is a 46 y.o. female with a past medical history of morbid obesity, diabetes, anxiety, hypertension, and left below the knee amputation who presents for right lower extremity swelling.  She was last seen at [facility] on August 9.    Past Medical History:  Diagnosis Date   Hyperlipidemia    Hypertension    Obesity    Type 2 diabetes mellitus (HCC)      Review of Systems:    Reports leg swelling and pain Denies fever, numbness, tingling, chills, sweats, headache    Physical Exam:  Vitals:   12/01/21 0919  BP: 129/89  Pulse: 91  Temp: 98.4 F (36.9 C)  TempSrc: Oral  SpO2: 100%  Weight: 270 lb (122.5 kg)    General:   awake and alert, sitting comfortably in chair, cooperative, not in acute distress Skin:   warm and dry, small area 2-3cm of alopecia on scalp with mild erythema; no fluctuance, discharge, ulceration, bleeding Lungs:   normal respiratory effort, breathing unlabored, symmetrical chest rise, no crackles or wheezing Cardiac:   regular rate and rhythm, normal S1 and S2, dorsalis pedis pulses intact, no pitting edema Neurologic:   sensation to light touch in right lower extremity intact in dermatomes L4-S1 Psychiatric:   mood and affect normal, intelligible speech    Assessment & Plan:   Venous insufficiency of right leg Patient reports right lower extremity swelling that started about 1 month ago.  After her hospitalization in July, she returned to work and started to notice swelling and pain in her right leg.  She denies chest pain, shortness of breath, fever, chills, and orthopnea.  A full work-up was performed since her last visit on August 9 including Doppler ultrasound, BMP, BNP, and urine microalbumin.  Doppler ultrasound was negative, BMP showed normal renal function, and BNP was within normal limits. On exam, the right lower extremity was significantly swollen without pitting edema, DP pulse diminished but  intact.  Lungs were clear to auscultation including the bases. Sensation to light touch was intact on the right and the right lower extremity across dermatomes L4-S1.  The etiology is likely not cardiac, renal, or hepatic.  Her presentation is consistent with chronic venous insufficiency.  -Recommend use of compression stockings, elevation of right leg, and frequent ambulation    Abscess of skin and subcutaneous tissue Patient was hospitalized in July for facial cellulitis and developed an abscess on the top of her head.  Given a 7-day course of doxycycline, which she took.  On exam, there is an area of alopecia with mild erythema on the top of her scalp but it is without discharge, purulence, or fluctuance.  -Continue to monitor scalp and return to clinic if signs of infection return     See Encounters Tab for problem based charting.  Patient seen with Dr. Oswaldo Done

## 2021-11-29 NOTE — Progress Notes (Signed)
Attempted to call patient re: lower extremity venous DVT study. Study was negative for signs of DVT - the patient also has a follow up appt in 2 days for her leg swelling.

## 2021-12-01 ENCOUNTER — Ambulatory Visit: Payer: Managed Care, Other (non HMO) | Admitting: Student

## 2021-12-01 VITALS — BP 129/89 | HR 91 | Temp 98.4°F | Wt 270.0 lb

## 2021-12-01 DIAGNOSIS — E1169 Type 2 diabetes mellitus with other specified complication: Secondary | ICD-10-CM

## 2021-12-01 DIAGNOSIS — L02811 Cutaneous abscess of head [any part, except face]: Secondary | ICD-10-CM

## 2021-12-01 DIAGNOSIS — Z794 Long term (current) use of insulin: Secondary | ICD-10-CM

## 2021-12-01 DIAGNOSIS — I872 Venous insufficiency (chronic) (peripheral): Secondary | ICD-10-CM | POA: Insufficient documentation

## 2021-12-01 DIAGNOSIS — E1151 Type 2 diabetes mellitus with diabetic peripheral angiopathy without gangrene: Secondary | ICD-10-CM

## 2021-12-01 NOTE — Assessment & Plan Note (Signed)
Patient reports right lower extremity swelling that started about 1 month ago.  After her hospitalization in July, she returned to work and started to notice swelling and pain in her right leg.  She denies chest pain, shortness of breath, fever, chills, and orthopnea.  A full work-up was performed since her last visit on August 9 including Doppler ultrasound, BMP, BNP, and urine microalbumin.  Doppler ultrasound was negative, BMP showed normal renal function, and BNP was within normal limits. On exam, the right lower extremity was significantly swollen without pitting edema, DP pulse diminished but intact.  Lungs were clear to auscultation including the bases. Sensation to light touch was intact on the right and the right lower extremity across dermatomes L4-S1.  The etiology is likely not cardiac, renal, or hepatic.  Her presentation is consistent with chronic venous insufficiency.  -Recommend use of compression stockings, elevation of right leg, and frequent ambulation

## 2021-12-01 NOTE — Patient Instructions (Signed)
  Thank you, [name], for allowing [place] to provide your care today. Today we discussed . . .  > Leg Swelling       - we have ruled out anything serious causing your leg swelling       - you probably have something called chronic venous insufficiency       - the best treatment is compression stockings, elevation, and exercising your right leg > Diabetes       - continue to take your medications, we will see you back in about three months    I have ordered the following labs for you:  Lab Orders  No laboratory test(s) ordered today      Tests ordered today:  None   Referrals ordered today:   Referral Orders  No referral(s) requested today      I have ordered the following medication/changed the following medications:   Stop the following medications: There are no discontinued medications.   Start the following medications: No orders of the defined types were placed in this encounter.     Follow up: 3 months    Remember:  Please wear compression stockings, elevation, and exercises your right leg. We will see you again in about three months!   Should you have any questions or concerns please call the internal medicine clinic at [phone].     Lajuana Ripple, MD [facility]

## 2021-12-01 NOTE — Assessment & Plan Note (Signed)
Patient was hospitalized in July for facial cellulitis and developed an abscess on the top of her head.  Given a 7-day course of doxycycline, which she took.  On exam, there is an area of alopecia with mild erythema on the top of her scalp but it is without discharge, purulence, or fluctuance.  -Continue to monitor scalp and return to clinic if signs of infection return

## 2021-12-02 NOTE — Progress Notes (Signed)
Internal Medicine Clinic Attending  I saw and evaluated the patient.  I personally confirmed the key portions of the history and exam documented by Dr. Harper and I reviewed pertinent patient test results.  The assessment, diagnosis, and plan were formulated together and I agree with the documentation in the resident's note.  

## 2021-12-03 ENCOUNTER — Encounter: Payer: Self-pay | Admitting: Student

## 2021-12-06 ENCOUNTER — Encounter: Payer: Self-pay | Admitting: Student

## 2021-12-06 ENCOUNTER — Other Ambulatory Visit: Payer: Self-pay

## 2021-12-06 MED ORDER — PREGABALIN 100 MG PO CAPS: ORAL_CAPSULE | ORAL | 0 refills | Status: DC

## 2021-12-06 NOTE — Telephone Encounter (Signed)
Return pt's call - no answer; left message the office had call and give [place] a call if needed.

## 2021-12-06 NOTE — Telephone Encounter (Signed)
Return call from pt who stated she's having "shooting foot pain". Stated she has taken Lyrica for neuropathy; requesting a refill. And stated she had mentioned  this to the doctor at her last OV. Thanks

## 2021-12-06 NOTE — Telephone Encounter (Signed)
Pt called / informed of Lyrica refill.

## 2021-12-06 NOTE — Telephone Encounter (Signed)
Requesting to speak with a nurse about getting medication for leg pain.

## 2021-12-23 ENCOUNTER — Ambulatory Visit (INDEPENDENT_AMBULATORY_CARE_PROVIDER_SITE_OTHER): Payer: Managed Care, Other (non HMO)

## 2021-12-23 ENCOUNTER — Ambulatory Visit: Payer: Managed Care, Other (non HMO) | Admitting: Orthopedic Surgery

## 2021-12-23 DIAGNOSIS — Z89512 Acquired absence of left leg below knee: Secondary | ICD-10-CM

## 2021-12-23 DIAGNOSIS — M79671 Pain in right foot: Secondary | ICD-10-CM

## 2021-12-23 DIAGNOSIS — M14671 Charcot's joint, right ankle and foot: Secondary | ICD-10-CM | POA: Diagnosis not present

## 2021-12-23 NOTE — Progress Notes (Signed)
Office Visit Note   Patient: Lori Schmidt           Date of Birth: 03-Oct-1975           MRN: [id number] Visit Date: 12/23/2021              Requested by: Primus Bravo, NP [address] [place],  [place] [address] PCP: Rana Snare, DO  Chief Complaint  Patient presents with   Left Leg - Follow-up    Hx left BKA 02/19/2021   Right Foot - Pain      HPI: Patient is a 46 year old woman who is seen for 2 separate issues.  #1 she has had drastic reduction in the volume of the residual limb left transtibial amputation she is wearing over 30 ply of socks by the end of the day and is still developing pressure on the residual limb.  Patient has instability with the residual limb and is at risk of falling.  Patient is working on her feet over 5 hours a day and is been extremely productive with wearing her prosthesis.  Assessment & Plan: Visit Diagnoses:  1. Pain in right foot   2. History of left below knee amputation (HCC)   3. Charcot's joint of right foot     Plan: Patient was provided a prescription for a new socket.  With patient's significant loss of residual volume she is at increased risk of falling or developing ulcers which would require additional surgery.  Patient will follow-up with [facility].  Patient is a high risk of being unable to work or having a traumatic injury without modification of the socket.  Follow-Up Instructions: No follow-ups on file.   Ortho Exam  Patient is alert, oriented, no adenopathy, well-dressed, normal affect, normal respiratory effort. Examination of the right foot patient has a Charcot collapse with no redness no signs of acute changes or cellulitis.  There is a superficial blister on the plantar midfoot consistent with the collapse of the lateral cuneiform.  Examination the right leg she has no full-thickness ulcers she has had the socket heavily modified and is wearing extra ply sock which has not providing a safe fit of her  prosthesis.  Patient is an existing left transtibial  amputee.  Patient's current comorbidities are not expected to impact the ability to function with the prescribed prosthesis. Patient verbally communicates a strong desire to use a prosthesis. Patient currently requires mobility aids to ambulate without a prosthesis.  Expects not to use mobility aids with a new prosthesis.  Patient is a K3 level ambulator that spends a lot of time walking around on uneven terrain over obstacles, up and down stairs, and ambulates with a variable cadence.     Imaging: No results found. No images are attached to the encounter.  Labs: Lab Results  Component Value Date   HGBA1C 10.1 (H) 10/18/2021   ESRSEDRATE 15 03/22/2021   ESRSEDRATE 25 (H) 03/15/2021   ESRSEDRATE 67 (H) 02/26/2021   CRP 0.7 03/22/2021   CRP 0.9 03/15/2021   CRP 7.5 (H) 02/26/2021   REPTSTATUS 10/21/2021 FINAL 10/16/2021   CULT  10/16/2021    NO GROWTH 5 DAYS Performed at [facility], [address], [place], [place] [address]    [facility] STAPHYLOCOCCUS AUREUS 02/16/2021     Lab Results  Component Value Date   ALBUMIN 3.4 (L) 10/16/2021   ALBUMIN 2.7 (L) 03/22/2021   ALBUMIN 2.8 (L) 03/15/2021    Lab Results  Component Value  Date   MG 2.1 02/20/2021   MG 2.0 02/18/2021   No results found for: "VD25OH"  No results found for: "PREALBUMIN"    Latest Ref Rng & Units 10/26/2021    4:42 PM 10/19/2021    1:01 AM 10/18/2021    1:09 AM  CBC EXTENDED  WBC 3.4 - 10.8 x10E3/uL 11.1  7.1  9.6   RBC 3.77 - 5.28 x10E6/uL 4.70  4.02  3.98   Hemoglobin 11.1 - 15.9 g/dL 96.2  95.2  84.1   HCT 34.0 - 46.6 % 41.0  34.4  34.1   Platelets 150 - 450 x10E3/uL 342  265  234   NEUT# 1.4 - 7.0 x10E3/uL 7.7  5.0  7.0   Lymph# 0.7 - 3.1 x10E3/uL 2.5  1.4  1.6      There is no height or weight on file to calculate BMI.  Orders:  Orders Placed This Encounter  Procedures   XR Foot 2 Views Right   No orders of the defined  types were placed in this encounter.    Procedures: No procedures performed  Clinical Data: No additional findings.  ROS:  All other systems negative, except as noted in the HPI. Review of Systems  Objective: Vital Signs: There were no vitals taken for this visit.  Specialty Comments:  No specialty comments available.  PMFS History: Patient Active Problem List   Diagnosis Date Noted   Venous insufficiency of right leg 12/01/2021   Leg edema, right 11/17/2021   Encounter for birth control 11/10/2021   Anxiety 10/26/2021   Encounter to establish care 10/26/2021   Abscess of skin and subcutaneous tissue 10/16/2021   Dyslipidemia    Hypoalbuminemia due to protein-calorie malnutrition (HCC)    Diabetic peripheral neuropathy (HCC)    Acute blood loss anemia    S/P BKA (below knee amputation) unilateral, left (HCC) 03/05/2021   Left below-knee amputee (HCC) 03/05/2021   Adjustment disorder with mixed anxiety and depressed mood    Pressure injury of skin 02/28/2021   Discitis of lumbar region    Subacute osteomyelitis, left ankle and foot (HCC)    Diabetes mellitus (HCC)    Left leg cellulitis    Bacteremia due to Staphylococcus aureus 02/17/2021   Morbid obesity with BMI of 45.0-49.9, adult (HCC) 01/05/2012   Past Medical History:  Diagnosis Date   Hyperlipidemia    Hypertension    Obesity    Type 2 diabetes mellitus (HCC)     Family History  Problem Relation Age of Onset   Hypothyroidism Mother    Diabetes Mother    Diabetes Maternal Grandmother    Breast cancer Paternal Grandmother     Past Surgical History:  Procedure Laterality Date   AMPUTATION Left 02/19/2021   Procedure: AMPUTATION BELOW KNEE;  Surgeon: Nadara Mustard, MD;  Location: [facility] OR;  Service: Orthopedics;  Laterality: Left;   BREAST CYST EXCISION Right 05/2018   four cysts removed on right breast/axilla area   TEE WITHOUT CARDIOVERSION N/A 02/23/2021   Procedure: TRANSESOPHAGEAL ECHOCARDIOGRAM  (TEE);  Surgeon: Chrystie Nose, MD;  Location: [facility] ENDOSCOPY;  Service: Cardiovascular;  Laterality: N/A;   Social History   Occupational History   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

## 2021-12-24 ENCOUNTER — Other Ambulatory Visit: Payer: Self-pay | Admitting: Family Medicine

## 2021-12-27 ENCOUNTER — Other Ambulatory Visit: Payer: Self-pay | Admitting: Family Medicine

## 2021-12-27 DIAGNOSIS — L089 Local infection of the skin and subcutaneous tissue, unspecified: Secondary | ICD-10-CM

## 2022-01-06 ENCOUNTER — Ambulatory Visit (INDEPENDENT_AMBULATORY_CARE_PROVIDER_SITE_OTHER): Payer: Managed Care, Other (non HMO) | Admitting: Orthopedic Surgery

## 2022-01-06 DIAGNOSIS — Z89512 Acquired absence of left leg below knee: Secondary | ICD-10-CM | POA: Diagnosis not present

## 2022-01-06 DIAGNOSIS — L97511 Non-pressure chronic ulcer of other part of right foot limited to breakdown of skin: Secondary | ICD-10-CM

## 2022-01-06 DIAGNOSIS — M79671 Pain in right foot: Secondary | ICD-10-CM | POA: Diagnosis not present

## 2022-01-07 ENCOUNTER — Telehealth: Payer: Self-pay | Admitting: Orthopedic Surgery

## 2022-01-07 NOTE — Telephone Encounter (Signed)
Pt came by office to pick up letter

## 2022-01-07 NOTE — Telephone Encounter (Signed)
Called pt, no answer and VM box is full. I will leave at the front desk for her to p/u.

## 2022-01-07 NOTE — Telephone Encounter (Signed)
Patient called asking for a note for her employer letting them know that she will be out of work for 2 weeks since Dr. Sharol Given said he didn't want her to put any weight on it. If you could let the patient know when that letter is ready to be picked up. Thank you CB # [phone]

## 2022-01-10 ENCOUNTER — Encounter: Payer: Self-pay | Admitting: Orthopedic Surgery

## 2022-01-10 NOTE — Progress Notes (Signed)
Office Visit Note   Patient: Lori Schmidt           Date of Birth: 03-05-1976           MRN: [id number] Visit Date: 01/06/2022              Requested by: Angelique Blonder, DO [address] [place],  [place] [address] PCP: Angelique Blonder, DO  Chief Complaint  Patient presents with   Left Leg - Follow-up    Hx left BKA 02/19/2021      HPI: Patient is a 46 year old woman who presents for 2 separate issues.  #1 she is subsiding into her socket and has been having tissue breakdown problems.  Patient's socket is less than 6 months old and insurance has not approved a revision socket.  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  Assessment & Plan: Visit Diagnoses:  1. Pain in right foot   2. History of left below knee amputation ([place])   3. Non-pressure chronic ulcer of other part of right foot limited to breakdown of skin ([place])     Plan: Discussed the importance for protected weightbearing on the right foot.  Patient states she has to work.  Recommend that she try using a kneeling scooter and application for Social Security disability.  Patient will need a new socket.  Follow-Up Instructions: Return in about 2 weeks (around 01/20/2022).   Ortho Exam  Patient is alert, oriented, no adenopathy, well-dressed, normal affect, normal respiratory effort. Examination patient has no open wounds on the left transtibial amputation.  There is no cellulitis no drainage.  Examination of the right foot she has a Wagner grade 1 ulcer with Charcot collapse.  Ulceration beneath the Charcot rocker-bottom deformity.  There is no cellulitis no signs of any active Charcot process.  After informed consent a 10 blade knife was used to debride the skin and soft tissue back to healthy viable granulation tissue.  After debridement the ulcer was 6 cm in diameter with healthy  granulation tissue no exposed bone or tendon no tunneling.  Imaging: No results found.    Labs: Lab Results  Component Value Date   HGBA1C 10.1 (H) 10/18/2021   ESRSEDRATE 15 03/22/2021   ESRSEDRATE 25 (H) 03/15/2021   ESRSEDRATE 67 (H) 02/26/2021   CRP 0.7 03/22/2021   CRP 0.9 03/15/2021   CRP 7.5 (H) 02/26/2021   REPTSTATUS 10/21/2021 FINAL 10/16/2021   CULT  10/16/2021    NO GROWTH 5 DAYS Performed at [facility], [place] [address], [place], [place] [address]    [facility]ORGA STAPHYLOCOCCUS AUREUS 02/16/2021     Lab Results  Component Value Date   ALBUMIN 3.4 (L) 10/16/2021   ALBUMIN 2.7 (L) 03/22/2021   ALBUMIN 2.8 (L) 03/15/2021    Lab Results  Component Value Date   MG 2.1 02/20/2021   MG 2.0 02/18/2021   No results found for: "VD25OH"  No results found for: "PREALBUMIN"    Latest Ref Rng & Units 10/26/2021    4:42 PM 10/19/2021    1:01 AM 10/18/2021    1:09 AM  CBC EXTENDED  WBC 3.4 - 10.8 x10E3/uL 11.1  7.1  9.6   RBC 3.77 - 5.28 x10E6/uL 4.70  4.02  3.98   Hemoglobin 11.1 - 15.9 g/dL 13.2  11.5  11.3  HCT 34.0 - 46.6 % 41.0  34.4  34.1   Platelets 150 - 450 x10E3/uL 342  265  234   NEUT# 1.4 - 7.0 x10E3/uL 7.7  5.0  7.0   Lymph# 0.7 - 3.1 x10E3/uL 2.5  1.4  1.6      There is no height or weight on file to calculate BMI.  Orders:  No orders of the defined types were placed in this encounter.  No orders of the defined types were placed in this encounter.    Procedures: No procedures performed  Clinical Data: No additional findings.  ROS:  All other systems negative, except as noted in the HPI. Review of Systems  Objective: Vital Signs: There were no vitals taken for this visit.  Specialty Comments:  No specialty comments available.  PMFS History: Patient Active Problem List   Diagnosis Date Noted   Venous insufficiency of right leg 12/01/2021   Leg edema, right 11/17/2021   Encounter for birth control 11/10/2021   Anxiety  10/26/2021   Encounter to establish care 10/26/2021   Abscess of skin and subcutaneous tissue 10/16/2021   Dyslipidemia    Hypoalbuminemia due to protein-calorie malnutrition (HCC)    Diabetic peripheral neuropathy (HCC)    Acute blood loss anemia    S/P BKA (below knee amputation) unilateral, left (HCC) 03/05/2021   Left below-knee amputee (HCC) 03/05/2021   Adjustment disorder with mixed anxiety and depressed mood    Pressure injury of skin 02/28/2021   Discitis of lumbar region    Subacute osteomyelitis, left ankle and foot (HCC)    Diabetes mellitus (HCC)    Left leg cellulitis    Bacteremia due to Staphylococcus aureus 02/17/2021   Morbid obesity with BMI of 45.0-49.9, adult (HCC) 01/05/2012   Past Medical History:  Diagnosis Date   Hyperlipidemia    Hypertension    Obesity    Type 2 diabetes mellitus (HCC)     Family History  Problem Relation Age of Onset   Hypothyroidism Mother    Diabetes Mother    Diabetes Maternal Grandmother    Breast cancer Paternal Grandmother     Past Surgical History:  Procedure Laterality Date   AMPUTATION Left 02/19/2021   Procedure: AMPUTATION BELOW KNEE;  Surgeon: Nadara Mustard, MD;  Location: [facility] OR;  Service: Orthopedics;  Laterality: Left;   BREAST CYST EXCISION Right 05/2018   four cysts removed on right breast/axilla area   TEE WITHOUT CARDIOVERSION N/A 02/23/2021   Procedure: TRANSESOPHAGEAL ECHOCARDIOGRAM (TEE);  Surgeon: Chrystie Nose, MD;  [facility] ENDOSCOPY;  Service: Cardiovascular;  Laterality: N/A;   Social History   Occupational History   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

## 2022-01-20 ENCOUNTER — Ambulatory Visit (INDEPENDENT_AMBULATORY_CARE_PROVIDER_SITE_OTHER): Payer: Managed Care, Other (non HMO) | Admitting: Orthopedic Surgery

## 2022-01-20 DIAGNOSIS — M14671 Charcot's joint, right ankle and foot: Secondary | ICD-10-CM

## 2022-01-20 DIAGNOSIS — Z89512 Acquired absence of left leg below knee: Secondary | ICD-10-CM

## 2022-01-20 DIAGNOSIS — L97511 Non-pressure chronic ulcer of other part of right foot limited to breakdown of skin: Secondary | ICD-10-CM | POA: Diagnosis not present

## 2022-01-21 ENCOUNTER — Encounter: Payer: Self-pay | Admitting: Orthopedic Surgery

## 2022-01-21 NOTE — Progress Notes (Signed)
Office Visit Note   Patient: Lori Schmidt           Date of Birth: Sep 17, 1975           MRN: [id number] Visit Date: 01/20/2022              Requested by: Angelique Blonder, DO [address] [place],  [place] [address] PCP: Angelique Blonder, DO  Chief Complaint  Patient presents with   Right Foot - Wound Check      HPI: Patient is a 46 year old woman who is status post left below-knee amputation with subsidence into the socket.  She has also had ulceration and cellulitis of the right foot beneath the Charcot collapse.  Assessment & Plan: Visit Diagnoses:  1. History of left below knee amputation ([place])   2. Non-pressure chronic ulcer of other part of right foot limited to breakdown of skin ([place])   3. Charcot's joint of right foot     Plan: Patient has stopped working and will need to apply for Social Security disability.  With her continued weightbearing she would be at risk for an amputation of the right lower extremity.  Follow-Up Instructions: Return in about 4 weeks (around 02/17/2022).   Ortho Exam  Patient is alert, oriented, no adenopathy, well-dressed, normal affect, normal respiratory effort. Examination the cellulitis has resolved.  There is no active Charcot process.  Patient has a large Wagner grade 1 ulcer beneath the Charcot rocker-bottom deformity.  After informed consent a 10 blade knife was used to debride the skin and soft tissue back to healthy viable granulation tissue.  After debridement the ulcer is 5 x 9 cm and 2 mm deep.  Imaging: No results found.   Labs: Lab Results  Component Value Date   HGBA1C 10.1 (H) 10/18/2021   ESRSEDRATE 15 03/22/2021   ESRSEDRATE 25 (H) 03/15/2021   ESRSEDRATE 67 (H) 02/26/2021   CRP 0.7 03/22/2021   CRP 0.9 03/15/2021   CRP 7.5 (H) 02/26/2021   REPTSTATUS 10/21/2021 FINAL 10/16/2021   CULT  10/16/2021    NO GROWTH 5 DAYS Performed at [facility], [place] [address], [place], [place] [address]    [facility]  STAPHYLOCOCCUS AUREUS 02/16/2021     Lab Results  Component Value Date   ALBUMIN 3.4 (L) 10/16/2021   ALBUMIN 2.7 (L) 03/22/2021   ALBUMIN 2.8 (L) 03/15/2021    Lab Results  Component Value Date   MG 2.1 02/20/2021   MG 2.0 02/18/2021   No results found for: "VD25OH"  No results found for: "PREALBUMIN"    Latest Ref Rng & Units 10/26/2021    4:42 PM 10/19/2021    1:01 AM 10/18/2021    1:09 AM  CBC EXTENDED  WBC 3.4 - 10.8 x10E3/uL 11.1  7.1  9.6   RBC 3.77 - 5.28 x10E6/uL 4.70  4.02  3.98   Hemoglobin 11.1 - 15.9 g/dL 13.2  11.5  11.3   HCT 34.0 - 46.6 % 41.0  34.4  34.1   Platelets 150 - 450 x10E3/uL 342  265  234   NEUT# 1.4 - 7.0 x10E3/uL 7.7  5.0  7.0   Lymph# 0.7 - 3.1 x10E3/uL 2.5  1.4  1.6      There is no height or weight on file to calculate BMI.  Orders:  No orders of the defined types were placed in this encounter.  No orders of the defined types were placed in this encounter.    Procedures: No  procedures performed  Clinical Data: No additional findings.  ROS:  All other systems negative, except as noted in the HPI. Review of Systems  Objective: Vital Signs: There were no vitals taken for this visit.  Specialty Comments:  No specialty comments available.  PMFS History: Patient Active Problem List   Diagnosis Date Noted   Venous insufficiency of right leg 12/01/2021   Leg edema, right 11/17/2021   Encounter for birth control 11/10/2021   Anxiety 10/26/2021   Encounter to establish care 10/26/2021   Abscess of skin and subcutaneous tissue 10/16/2021   Dyslipidemia    Hypoalbuminemia due to protein-calorie malnutrition (HCC)    Diabetic peripheral neuropathy (HCC)    Acute blood loss anemia    S/P BKA (below knee amputation) unilateral, left (HCC) 03/05/2021   Left below-knee amputee (HCC) 03/05/2021   Adjustment disorder with mixed anxiety and depressed mood    Pressure injury of skin 02/28/2021   Discitis of lumbar region     Subacute osteomyelitis, left ankle and foot (HCC)    Diabetes mellitus (HCC)    Left leg cellulitis    Bacteremia due to Staphylococcus aureus 02/17/2021   Morbid obesity with BMI of 45.0-49.9, adult (HCC) 01/05/2012   Past Medical History:  Diagnosis Date   Hyperlipidemia    Hypertension    Obesity    Type 2 diabetes mellitus (HCC)     Family History  Problem Relation Age of Onset   Hypothyroidism Mother    Diabetes Mother    Diabetes Maternal Grandmother    Breast cancer Paternal Grandmother     Past Surgical History:  Procedure Laterality Date   AMPUTATION Left 02/19/2021   Procedure: AMPUTATION BELOW KNEE;  Surgeon: Nadara Mustard, MD;  Location: [facility] OR;  Service: Orthopedics;  Laterality: Left;   BREAST CYST EXCISION Right 05/2018   four cysts removed on right breast/axilla area   TEE WITHOUT CARDIOVERSION N/A 02/23/2021   Procedure: TRANSESOPHAGEAL ECHOCARDIOGRAM (TEE);  Surgeon: Chrystie Nose, MD;  Location: [facility] ENDOSCOPY;  Service: Cardiovascular;  Laterality: N/A;   Social History   Occupational History   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

## 2022-02-12 ENCOUNTER — Other Ambulatory Visit: Payer: Self-pay | Admitting: Registered Nurse

## 2022-02-17 ENCOUNTER — Ambulatory Visit: Payer: Medicaid Other | Admitting: Orthopedic Surgery

## 2022-02-17 DIAGNOSIS — Z89512 Acquired absence of left leg below knee: Secondary | ICD-10-CM

## 2022-02-17 DIAGNOSIS — L97511 Non-pressure chronic ulcer of other part of right foot limited to breakdown of skin: Secondary | ICD-10-CM | POA: Diagnosis not present

## 2022-02-25 ENCOUNTER — Ambulatory Visit (INDEPENDENT_AMBULATORY_CARE_PROVIDER_SITE_OTHER): Payer: Medicaid Other | Admitting: Student

## 2022-02-25 ENCOUNTER — Other Ambulatory Visit: Payer: Self-pay

## 2022-02-25 ENCOUNTER — Encounter: Payer: Self-pay | Admitting: Student

## 2022-02-25 VITALS — BP 139/83 | HR 99 | Temp 97.9°F | Ht 67.0 in | Wt 272.7 lb

## 2022-02-25 DIAGNOSIS — Z794 Long term (current) use of insulin: Secondary | ICD-10-CM

## 2022-02-25 DIAGNOSIS — Z3042 Encounter for surveillance of injectable contraceptive: Secondary | ICD-10-CM | POA: Diagnosis present

## 2022-02-25 DIAGNOSIS — Z3202 Encounter for pregnancy test, result negative: Secondary | ICD-10-CM | POA: Diagnosis not present

## 2022-02-25 DIAGNOSIS — E1169 Type 2 diabetes mellitus with other specified complication: Secondary | ICD-10-CM

## 2022-02-25 DIAGNOSIS — Z23 Encounter for immunization: Secondary | ICD-10-CM | POA: Diagnosis not present

## 2022-02-25 DIAGNOSIS — Z0000 Encounter for general adult medical examination without abnormal findings: Secondary | ICD-10-CM

## 2022-02-25 DIAGNOSIS — Z139 Encounter for screening, unspecified: Secondary | ICD-10-CM | POA: Insufficient documentation

## 2022-02-25 LAB — GLUCOSE, CAPILLARY: Glucose-Capillary: 318 mg/dL — ABNORMAL HIGH (ref 70–99)

## 2022-02-25 LAB — POCT GLYCOSYLATED HEMOGLOBIN (HGB A1C): Hemoglobin A1C: 12.2 % — AB (ref 4.0–5.6)

## 2022-02-25 LAB — POCT URINE PREGNANCY: Preg Test, Ur: NEGATIVE

## 2022-02-25 MED ORDER — SOLIQUA 100-33 UNT-MCG/ML ~~LOC~~ SOPN: 30.0000 [IU] | PEN_INJECTOR | Freq: Every day | SUBCUTANEOUS | 2 refills | Status: DC

## 2022-02-25 MED ORDER — DAPAGLIFLOZIN PROPANEDIOL 10 MG PO TABS: 10.0000 mg | ORAL_TABLET | Freq: Every day | ORAL | 11 refills | Status: DC

## 2022-02-25 MED ORDER — MEDROXYPROGESTERONE ACETATE 104 MG/0.65ML ~~LOC~~ SUSY
104.0000 mg | PREFILLED_SYRINGE | Freq: Once | SUBCUTANEOUS | Status: AC
  Administered 2022-02-25: 104 mg via SUBCUTANEOUS

## 2022-02-25 NOTE — Progress Notes (Signed)
CC: diabetes follow-up, depo injecton  HPI:  [name] is a 46 y.o. female living with a history stated below and presents today for diabetes follow-up and depo injection. Please see problem based assessment and plan for additional details.  Past Medical History:  Diagnosis Date   Hyperlipidemia    Hypertension    Obesity    Type 2 diabetes mellitus (HCC)     Current Outpatient Medications on File Prior to Visit  Medication Sig Dispense Refill   acetaminophen (TYLENOL) 325 MG tablet Take 1-2 tablets (325-650 mg total) by mouth every 6 (six) hours as needed for mild pain (pain score 1-3 or temp > 100.5).     ascorbic acid (VITAMIN C) 1000 MG tablet Take 1 tablet (1,000 mg total) by mouth daily. (Patient not taking: Reported on 10/17/2021) 30 tablet 0   citalopram (CELEXA) 40 MG tablet Take 1 tablet (40 mg total) by mouth at bedtime. 30 tablet 0   cyanocobalamin 1000 MCG tablet Take 1/2 tablet (500 mcg total) by mouth daily. 15 tablet 0   hydrOXYzine (ATARAX) 10 MG tablet Take 1 tablet (10 mg total) by mouth 3 (three) times daily as needed. 30 tablet 0   losartan (COZAAR) 25 MG tablet Take 1 tablet (25 mg total) by mouth at bedtime. 30 tablet 0   magnesium oxide (MAG-OX) 400 MG tablet Take 1 tablet (400 mg total) by mouth daily. 30 tablet 0   medroxyPROGESTERone Acetate (DEPO-PROVERA IM) Inject into the muscle every 3 (three) months. (Patient not taking: Reported on 08/17/2021)     pantoprazole (PROTONIX) 40 MG tablet Take 1 tablet (40 mg total) by mouth daily. (Patient not taking: Reported on 08/17/2021) 30 tablet 0   pravastatin (PRAVACHOL) 40 MG tablet Take 1 tablet (40 mg total) by mouth daily. 30 tablet 0   pregabalin (LYRICA) 100 MG capsule Take 1 capsule (100 mg) by mouth twice daily and 2 capsules (200 mg total) nightly 120 capsule 0   No current facility-administered medications on file prior to visit.   Review of Systems: ROS negative except for what is noted on  the assessment and plan.  Vitals:   02/25/22 0954  BP: 139/83  Pulse: 99  Temp: 97.9 F (36.6 C)  TempSrc: Oral  SpO2: 100%  Weight: 272 lb 11.2 oz (123.7 kg)  Height: 5\' 7"  (1.702 m)   Physical Exam: Constitutional: awake and alert, sitting comfortably in wheelchair, in no acute distress HENT: normocephalic atraumatic Neck: supple Pulmonary/Chest: normal work of breathing on room air MSK: left BKA noted, bandaged right foot ulcer Neurological: alert & oriented x 3 Skin: warm and dry Psych: normal mood and behavior  Assessment & Plan:   Diabetes mellitus (HCC) A1c today 12.2 from 10.1. She reports taking Levemir 50 units BID and Januvia 100 mg daily. She is unsure if she has been taking . Given poorly controlled diabetes, would like to make adjustments to maximize glycemic control. Discussed starting a GLP-1 option and she is agreeable to that. She is unwilling to check her glucose via fingerstick. Discussed CGM options but appears not in formulary. She does have a Dexcom G6 sensor from previous that she will try. Advised her since changing insulin to setup CGM, monitor her glucose daily and have close follow-up.  Plan -Stop Januvia -Refilled and increased Farxiga to 10 mg daily -Start Soliqua (glargine-lixisenatide) 30 units daily (goal is to titrate 2-4 units every week until fasting glycemic goal of <140, however, unsure if patient is  comfortable with doing that so we will have close follow-up) -Advised patient to setup Dexcom G6  -nutrition and diabetes referral to Lori Schmidt -follow up in 2-4 weeks  Encounter for birth control Negative urine pregnancy test today. Patient received depo-provera shot. Continue every 3 months.   Health care maintenance -received flu shot today -deferred referral to ophthalmology and GI-colonoscopy since she is applying for disability and having insurance changes -deferred Pap smear, patient wanted to focus on her diabetes control  today   Patient discussed with Dr. Layne Benton, D.O. [facility] Medicine, PGY-1 Phone: [phone] Date 02/25/2022 Time 9:26 PM

## 2022-02-25 NOTE — Assessment & Plan Note (Signed)
A1c today 12.2 from 10.1. She reports taking Levemir 50 units BID and Januvia 100 mg daily. She is unsure if she has been taking [place]. Given poorly controlled diabetes, would like to make adjustments to maximize glycemic control. Discussed starting a GLP-1 option and she is agreeable to that. She is unwilling to check her glucose via fingerstick. Discussed CGM options but appears not in formulary. She does have a Dexcom G6 sensor from previous that she will try. Advised her since changing insulin to setup CGM, monitor her glucose daily and have close follow-up.  Plan -Stop Januvia -Refilled and increased Farxiga to 10 mg daily -Start Soliqua (glargine-lixisenatide) 30 units daily (goal is to titrate 2-4 units every week until fasting glycemic goal of <140, however, unsure if patient is comfortable with doing that so we will have close follow-up) -Advised patient to setup Dexcom G6  -nutrition and diabetes referral to Lupita Leash -follow up in 2-4 weeks

## 2022-02-25 NOTE — Patient Instructions (Addendum)
Thank you, Ms.Dalisa Forrer for allowing [place] to provide your care today.   Diabetes -Stop Januvia -Stop Levemir -Increase Farxiga to 10 mg daily -Start Soliqua 30 units daily (insulin glargine and lixisenatide) -Please have your Dexcom G6 sensor in place to check your blood sugar. If you notice any low sugar levels, contact the clinic.  -I will send referral to see Ms. Lupita Leash for diabetes management and help with getting Dexcom or Josephine Igo.   -Continue following with Dr. Lajoyce Corners for your right foot ulcer.   -Depo shot today. Urine pregnancy negative today.  -Flu shot today.   I have ordered the following labs for you:  Lab Orders         Glucose, capillary         POC Hbg A1C         POCT Urine Pregnancy       Referrals ordered today:   Referral Orders  No referral(s) requested today     I have ordered the following medication/changed the following medications:   Stop the following medications: Medications Discontinued During This Encounter  Medication Reason   diclofenac Sodium (VOLTAREN) 1 % GEL    docusate sodium (COLACE) 100 MG capsule    metFORMIN (GLUCOPHAGE) 1000 MG tablet    methocarbamol (ROBAXIN) 750 MG tablet    sitaGLIPtin (JANUVIA) 100 MG tablet    dapagliflozin propanediol (FARXIGA) 5 MG TABS tablet    insulin detemir (LEVEMIR) 100 UNIT/ML FlexPen      Start the following medications: Meds ordered this encounter  Medications   medroxyPROGESTERone (DEPO-SUBQ PROVERA 104) injection 104 mg   dapagliflozin propanediol (FARXIGA) 10 MG TABS tablet    Sig: Take 1 tablet (10 mg total) by mouth daily before breakfast.    Dispense:  30 tablet    Refill:  11   Insulin Glargine-Lixisenatide (SOLIQUA) 100-33 UNT-MCG/ML SOPN    Sig: Inject 30 Units into the skin daily.    Dispense:  9 mL    Refill:  2     Follow up:  2-4 weeks    Remember: Please bring your Dexcom so we can check your glucose levels.   Should you have any questions or concerns please call  the internal medicine clinic at [phone].    Rana Snare, D.O. [facility]

## 2022-02-25 NOTE — Assessment & Plan Note (Signed)
Negative urine pregnancy test today. Patient received depo-provera shot. Continue every 3 months.

## 2022-02-25 NOTE — Assessment & Plan Note (Addendum)
-  received flu shot today -deferred referral to ophthalmology and GI-colonoscopy since she is applying for disability and having insurance changes -deferred Pap smear, patient wanted to focus on her diabetes control today

## 2022-02-28 NOTE — Progress Notes (Signed)
Internal Medicine Clinic Attending  I saw and evaluated the patient.  I personally confirmed the key portions of the history and exam documented by Dr. Zheng and I reviewed pertinent patient test results.  The assessment, diagnosis, and plan were formulated together and I agree with the documentation in the resident's note.  

## 2022-03-01 ENCOUNTER — Encounter: Payer: Self-pay | Admitting: Orthopedic Surgery

## 2022-03-01 NOTE — Progress Notes (Signed)
Office Visit Note   Patient: Lori Schmidt           Date of Birth: 09/08/1975           MRN: [id number] Visit Date: 02/17/2022              Requested by: Rana Snare, DO [address] [place],  [place] [address] PCP: Rana Snare, DO  Chief Complaint  Patient presents with   Right Foot - Follow-up      HPI: Patient is a 46 year old woman who presents for left transtibial amputation and right foot wound.  Patient has been unable to return to work due to her amputation.  Assessment & Plan: Visit Diagnoses:  1. History of left below knee amputation (HCC)   2. Non-pressure chronic ulcer of other part of right foot limited to breakdown of skin (HCC)     Plan: Continue wound care patient may go to the pool and use a water shoe for ambulation.  Follow-Up Instructions: Return in about 4 weeks (around 03/17/2022).   Ortho Exam  Patient is alert, oriented, no adenopathy, well-dressed, normal affect, normal respiratory effort. 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  The ulcer is flat 3 cm in diameter with granulation tissue there is no cellulitis no purulent drainage.  The ulcer does not probe to bone.  There is some maceration of the wound edges from the drainage.  Patient will continue her routine wound care.  Minimize weightbearing.  Imaging: No results found.   Labs: Lab Results  Component Value Date   HGBA1C 12.2 (A) 02/25/2022   HGBA1C 10.1 (H) 10/18/2021   ESRSEDRATE 15 03/22/2021   ESRSEDRATE 25 (H) 03/15/2021   ESRSEDRATE 67 (H) 02/26/2021   CRP 0.7 03/22/2021   CRP 0.9 03/15/2021   CRP 7.5 (H) 02/26/2021   REPTSTATUS 10/21/2021 FINAL 10/16/2021   CULT  10/16/2021    NO GROWTH 5 DAYS Performed at [facility], [address], [place], [place] [address]    [facility] STAPHYLOCOCCUS AUREUS 02/16/2021     Lab Results  Component Value Date   ALBUMIN 3.4 (L) 10/16/2021   ALBUMIN 2.7 (L)  03/22/2021   ALBUMIN 2.8 (L) 03/15/2021    Lab Results  Component Value Date   MG 2.1 02/20/2021   MG 2.0 02/18/2021   No results found for: "VD25OH"  No results found for: "PREALBUMIN"    Latest Ref Rng & Units 10/26/2021    4:42 PM 10/19/2021    1:01 AM 10/18/2021    1:09 AM  CBC EXTENDED  WBC 3.4 - 10.8 x10E3/uL 11.1  7.1  9.6   RBC 3.77 - 5.28 x10E6/uL 4.70  4.02  3.98   Hemoglobin 11.1 - 15.9 g/dL 78.4  69.6  29.5   HCT 34.0 - 46.6 % 41.0  34.4  34.1   Platelets 150 - 450 x10E3/uL 342  265  234   NEUT# 1.4 - 7.0 x10E3/uL 7.7  5.0  7.0   Lymph# 0.7 - 3.1 x10E3/uL 2.5  1.4  1.6      There is no height or weight on file to calculate BMI.  Orders:  No orders of the defined types were placed in this encounter.  No orders of the defined types were placed in this encounter.    Procedures: No procedures performed  Clinical Data: No additional findings.  ROS:  All other systems negative, except as noted in the HPI. Review  of Systems  Objective: Vital Signs: There were no vitals taken for this visit.  Specialty Comments:  No specialty comments available.  PMFS History: Patient Active Problem List   Diagnosis Date Noted   Health care maintenance 02/25/2022   Venous insufficiency of right leg 12/01/2021   Leg edema, right 11/17/2021   Encounter for birth control 11/10/2021   Anxiety 10/26/2021   Abscess of skin and subcutaneous tissue 10/16/2021   Dyslipidemia    Hypoalbuminemia due to protein-calorie malnutrition [facility])    Diabetic peripheral neuropathy (HCC)    Acute blood loss anemia    S/P BKA (below knee amputation) unilateral, left (HCC) 03/05/2021   Left below-knee amputee (HCC) 03/05/2021   Adjustment disorder with mixed anxiety and depressed mood    Pressure injury of skin 02/28/2021   Discitis of lumbar region    Diabetes mellitus (HCC)    Left leg cellulitis    Morbid obesity with BMI of 45.0-49.9, adult (HCC) 01/05/2012   Past Medical  History:  Diagnosis Date   Hyperlipidemia    Hypertension    Obesity    Type 2 diabetes mellitus (HCC)     Family History  Problem Relation Age of Onset   Hypothyroidism Mother    Diabetes Mother    Diabetes Maternal Grandmother    Breast cancer Paternal Grandmother     Past Surgical History:  Procedure Laterality Date   AMPUTATION Left 02/19/2021   Procedure: AMPUTATION BELOW KNEE;  Surgeon: Nadara Mustard, MD;  Location: [facility] OR;  Service: Orthopedics;  Laterality: Left;   BREAST CYST EXCISION Right 05/2018   four cysts removed on right breast/axilla area   TEE WITHOUT CARDIOVERSION N/A 02/23/2021   Procedure: TRANSESOPHAGEAL ECHOCARDIOGRAM (TEE);  Surgeon: Chrystie Nose, MD;  Location: [facility] ENDOSCOPY;  Service: Cardiovascular;  Laterality: N/A;   Social History   Occupational History   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

## 2022-03-07 ENCOUNTER — Ambulatory Visit: Payer: Managed Care, Other (non HMO) | Admitting: Dietician

## 2022-03-17 ENCOUNTER — Ambulatory Visit: Payer: Managed Care, Other (non HMO) | Admitting: Orthopedic Surgery

## 2022-03-22 ENCOUNTER — Emergency Department (HOSPITAL_COMMUNITY): Payer: Medicaid Other

## 2022-03-22 ENCOUNTER — Encounter: Payer: Self-pay | Admitting: Orthopedic Surgery

## 2022-03-22 ENCOUNTER — Ambulatory Visit (INDEPENDENT_AMBULATORY_CARE_PROVIDER_SITE_OTHER): Payer: Medicaid Other | Admitting: Orthopedic Surgery

## 2022-03-22 ENCOUNTER — Inpatient Hospital Stay (HOSPITAL_COMMUNITY)
Admission: EM | Admit: 2022-03-22 | Discharge: 2022-03-25 | DRG: 616 | Disposition: A | Discharge status: Home Health | Payer: Medicaid Other | Attending: Internal Medicine | Admitting: Internal Medicine

## 2022-03-22 ENCOUNTER — Encounter (HOSPITAL_COMMUNITY): Payer: Self-pay

## 2022-03-22 DIAGNOSIS — M869 Osteomyelitis, unspecified: Secondary | ICD-10-CM

## 2022-03-22 DIAGNOSIS — Z882 Allergy status to sulfonamides status: Secondary | ICD-10-CM | POA: Diagnosis not present

## 2022-03-22 DIAGNOSIS — L02611 Cutaneous abscess of right foot: Secondary | ICD-10-CM | POA: Diagnosis not present

## 2022-03-22 DIAGNOSIS — L03115 Cellulitis of right lower limb: Secondary | ICD-10-CM | POA: Diagnosis present

## 2022-03-22 DIAGNOSIS — A419 Sepsis, unspecified organism: Secondary | ICD-10-CM

## 2022-03-22 DIAGNOSIS — I10 Essential (primary) hypertension: Secondary | ICD-10-CM | POA: Diagnosis present

## 2022-03-22 DIAGNOSIS — Z833 Family history of diabetes mellitus: Secondary | ICD-10-CM | POA: Diagnosis not present

## 2022-03-22 DIAGNOSIS — E1142 Type 2 diabetes mellitus with diabetic polyneuropathy: Secondary | ICD-10-CM | POA: Diagnosis present

## 2022-03-22 DIAGNOSIS — Z794 Long term (current) use of insulin: Secondary | ICD-10-CM

## 2022-03-22 DIAGNOSIS — E88819 Insulin resistance, unspecified: Secondary | ICD-10-CM | POA: Diagnosis present

## 2022-03-22 DIAGNOSIS — L0390 Cellulitis, unspecified: Secondary | ICD-10-CM

## 2022-03-22 DIAGNOSIS — Z79899 Other long term (current) drug therapy: Secondary | ICD-10-CM | POA: Diagnosis not present

## 2022-03-22 DIAGNOSIS — R739 Hyperglycemia, unspecified: Secondary | ICD-10-CM

## 2022-03-22 DIAGNOSIS — E43 Unspecified severe protein-calorie malnutrition: Secondary | ICD-10-CM | POA: Diagnosis not present

## 2022-03-22 DIAGNOSIS — M726 Necrotizing fasciitis: Secondary | ICD-10-CM | POA: Diagnosis present

## 2022-03-22 DIAGNOSIS — E860 Dehydration: Secondary | ICD-10-CM | POA: Diagnosis present

## 2022-03-22 DIAGNOSIS — E1110 Type 2 diabetes mellitus with ketoacidosis without coma: Secondary | ICD-10-CM | POA: Diagnosis present

## 2022-03-22 DIAGNOSIS — E11621 Type 2 diabetes mellitus with foot ulcer: Secondary | ICD-10-CM | POA: Diagnosis present

## 2022-03-22 DIAGNOSIS — E785 Hyperlipidemia, unspecified: Secondary | ICD-10-CM | POA: Diagnosis present

## 2022-03-22 DIAGNOSIS — Z6841 Body Mass Index (BMI) 40.0 and over, adult: Secondary | ICD-10-CM | POA: Diagnosis not present

## 2022-03-22 DIAGNOSIS — E1169 Type 2 diabetes mellitus with other specified complication: Secondary | ICD-10-CM | POA: Diagnosis present

## 2022-03-22 DIAGNOSIS — A5216 Charcot's arthropathy (tabetic): Secondary | ICD-10-CM | POA: Diagnosis not present

## 2022-03-22 DIAGNOSIS — Z7984 Long term (current) use of oral hypoglycemic drugs: Secondary | ICD-10-CM | POA: Diagnosis not present

## 2022-03-22 DIAGNOSIS — M86271 Subacute osteomyelitis, right ankle and foot: Secondary | ICD-10-CM | POA: Diagnosis not present

## 2022-03-22 DIAGNOSIS — Z888 Allergy status to other drugs, medicaments and biological substances status: Secondary | ICD-10-CM | POA: Diagnosis not present

## 2022-03-22 DIAGNOSIS — E1143 Type 2 diabetes mellitus with diabetic autonomic (poly)neuropathy: Secondary | ICD-10-CM | POA: Diagnosis present

## 2022-03-22 DIAGNOSIS — L97518 Non-pressure chronic ulcer of other part of right foot with other specified severity: Secondary | ICD-10-CM | POA: Diagnosis present

## 2022-03-22 DIAGNOSIS — Z89511 Acquired absence of right leg below knee: Secondary | ICD-10-CM | POA: Diagnosis not present

## 2022-03-22 DIAGNOSIS — E11610 Type 2 diabetes mellitus with diabetic neuropathic arthropathy: Secondary | ICD-10-CM | POA: Diagnosis present

## 2022-03-22 DIAGNOSIS — Z89512 Acquired absence of left leg below knee: Secondary | ICD-10-CM | POA: Diagnosis not present

## 2022-03-22 DIAGNOSIS — E119 Type 2 diabetes mellitus without complications: Secondary | ICD-10-CM

## 2022-03-22 DIAGNOSIS — Z803 Family history of malignant neoplasm of breast: Secondary | ICD-10-CM

## 2022-03-22 DIAGNOSIS — Z87891 Personal history of nicotine dependence: Secondary | ICD-10-CM | POA: Diagnosis not present

## 2022-03-22 DIAGNOSIS — E11628 Type 2 diabetes mellitus with other skin complications: Secondary | ICD-10-CM | POA: Diagnosis not present

## 2022-03-22 LAB — CBG MONITORING, ED
Glucose-Capillary: 228 mg/dL — ABNORMAL HIGH (ref 70–99)
Glucose-Capillary: 232 mg/dL — ABNORMAL HIGH (ref 70–99)
Glucose-Capillary: 263 mg/dL — ABNORMAL HIGH (ref 70–99)
Glucose-Capillary: 272 mg/dL — ABNORMAL HIGH (ref 70–99)
Glucose-Capillary: 276 mg/dL — ABNORMAL HIGH (ref 70–99)
Glucose-Capillary: 296 mg/dL — ABNORMAL HIGH (ref 70–99)
Glucose-Capillary: 306 mg/dL — ABNORMAL HIGH (ref 70–99)
Glucose-Capillary: 313 mg/dL — ABNORMAL HIGH (ref 70–99)
Glucose-Capillary: 331 mg/dL — ABNORMAL HIGH (ref 70–99)

## 2022-03-22 LAB — TYPE AND SCREEN
ABO/RH(D): A POS
Antibody Screen: NEGATIVE

## 2022-03-22 LAB — URINALYSIS, ROUTINE W REFLEX MICROSCOPIC
Bilirubin Urine: NEGATIVE
Glucose, UA: >=500 mg/dL — AB
Ketones, ur: 80 mg/dL — AB
Leukocytes,Ua: NEGATIVE
Nitrite: NEGATIVE
Protein, ur: 30 mg/dL — AB
Specific Gravity, Urine: 1.022 (ref 1.005–1.030)
pH: 5 (ref 5.0–8.0)

## 2022-03-22 LAB — CBC WITH DIFFERENTIAL/PLATELET
Abs Immature Granulocytes: 0.07 10*3/uL (ref 0.00–0.07)
Basophils Absolute: 0 10*3/uL (ref 0.0–0.1)
Basophils Relative: 0 %
Eosinophils Absolute: 0 10*3/uL (ref 0.0–0.5)
Eosinophils Relative: 0 %
HCT: 30.7 % — ABNORMAL LOW (ref 36.0–46.0)
Hemoglobin: 10 g/dL — ABNORMAL LOW (ref 12.0–15.0)
Immature Granulocytes: 1 %
Lymphocytes Relative: 7 %
Lymphs Abs: 0.7 10*3/uL (ref 0.7–4.0)
MCH: 25.6 pg — ABNORMAL LOW (ref 26.0–34.0)
MCHC: 32.6 g/dL (ref 30.0–36.0)
MCV: 78.7 fL — ABNORMAL LOW (ref 80.0–100.0)
Monocytes Absolute: 0.9 10*3/uL (ref 0.1–1.0)
Monocytes Relative: 8 %
Neutro Abs: 8.9 10*3/uL — ABNORMAL HIGH (ref 1.7–7.7)
Neutrophils Relative %: 84 %
Platelets: 294 10*3/uL (ref 150–400)
RBC: 3.9 MIL/uL (ref 3.87–5.11)
RDW: 14.1 % (ref 11.5–15.5)
WBC: 10.6 10*3/uL — ABNORMAL HIGH (ref 4.0–10.5)
nRBC: 0 % (ref 0.0–0.2)

## 2022-03-22 LAB — I-STAT CHEM 8, ED
BUN: 6 mg/dL (ref 6–20)
Calcium, Ion: 1.15 mmol/L (ref 1.15–1.40)
Chloride: 97 mmol/L — ABNORMAL LOW (ref 98–111)
Creatinine, Ser: 0.3 mg/dL — ABNORMAL LOW (ref 0.44–1.00)
Glucose, Bld: 363 mg/dL — ABNORMAL HIGH (ref 70–99)
HCT: 32 % — ABNORMAL LOW (ref 36.0–46.0)
Hemoglobin: 10.9 g/dL — ABNORMAL LOW (ref 12.0–15.0)
Potassium: 3.5 mmol/L (ref 3.5–5.1)
Sodium: 134 mmol/L — ABNORMAL LOW (ref 135–145)
TCO2: 18 mmol/L — ABNORMAL LOW (ref 22–32)

## 2022-03-22 LAB — I-STAT VENOUS BLOOD GAS, ED
Acid-base deficit: 8 mmol/L — ABNORMAL HIGH (ref 0.0–2.0)
Bicarbonate: 18.1 mmol/L — ABNORMAL LOW (ref 20.0–28.0)
Calcium, Ion: 1.17 mmol/L (ref 1.15–1.40)
HCT: 31 % — ABNORMAL LOW (ref 36.0–46.0)
Hemoglobin: 10.5 g/dL — ABNORMAL LOW (ref 12.0–15.0)
O2 Saturation: 85 %
Potassium: 3.5 mmol/L (ref 3.5–5.1)
Sodium: 133 mmol/L — ABNORMAL LOW (ref 135–145)
TCO2: 19 mmol/L — ABNORMAL LOW (ref 22–32)
pCO2, Ven: 36.3 mmHg — ABNORMAL LOW (ref 44–60)
pH, Ven: 7.305 (ref 7.25–7.43)
pO2, Ven: 55 mmHg — ABNORMAL HIGH (ref 32–45)

## 2022-03-22 LAB — BASIC METABOLIC PANEL
Anion gap: 13 (ref 5–15)
Anion gap: 14 (ref 5–15)
BUN: 7 mg/dL (ref 6–20)
BUN: 8 mg/dL (ref 6–20)
CO2: 19 mmol/L — ABNORMAL LOW (ref 22–32)
CO2: 20 mmol/L — ABNORMAL LOW (ref 22–32)
Calcium: 8.1 mg/dL — ABNORMAL LOW (ref 8.9–10.3)
Calcium: 8 mg/dL — ABNORMAL LOW (ref 8.9–10.3)
Chloride: 104 mmol/L (ref 98–111)
Chloride: 101 mmol/L (ref 98–111)
Creatinine, Ser: 0.51 mg/dL (ref 0.44–1.00)
Creatinine, Ser: 0.54 mg/dL (ref 0.44–1.00)
GFR, Estimated: >60 mL/min (ref >60)
GFR, Estimated: >60 mL/min (ref >60)
Glucose, Bld: 228 mg/dL — ABNORMAL HIGH (ref 70–99)
Glucose, Bld: 284 mg/dL — ABNORMAL HIGH (ref 70–99)
Potassium: 3.1 mmol/L — ABNORMAL LOW (ref 3.5–5.1)
Potassium: 3.2 mmol/L — ABNORMAL LOW (ref 3.5–5.1)
Sodium: 136 mmol/L (ref 135–145)
Sodium: 135 mmol/L (ref 135–145)

## 2022-03-22 LAB — COMPREHENSIVE METABOLIC PANEL
ALT: 11 U/L (ref 0–44)
AST: 11 U/L — ABNORMAL LOW (ref 15–41)
Albumin: 2.1 g/dL — ABNORMAL LOW (ref 3.5–5.0)
Alkaline Phosphatase: 88 U/L (ref 38–126)
Anion gap: 20 — ABNORMAL HIGH (ref 5–15)
BUN: 6 mg/dL (ref 6–20)
CO2: 17 mmol/L — ABNORMAL LOW (ref 22–32)
Calcium: 8.6 mg/dL — ABNORMAL LOW (ref 8.9–10.3)
Chloride: 97 mmol/L — ABNORMAL LOW (ref 98–111)
Creatinine, Ser: 0.79 mg/dL (ref 0.44–1.00)
GFR, Estimated: >60 mL/min (ref >60)
Glucose, Bld: 363 mg/dL — ABNORMAL HIGH (ref 70–99)
Potassium: 3.7 mmol/L (ref 3.5–5.1)
Sodium: 134 mmol/L — ABNORMAL LOW (ref 135–145)
Total Bilirubin: 1 mg/dL (ref 0.3–1.2)
Total Protein: 6.9 g/dL (ref 6.5–8.1)

## 2022-03-22 LAB — BETA-HYDROXYBUTYRIC ACID: Beta-Hydroxybutyric Acid: 2.01 mmol/L — ABNORMAL HIGH (ref 0.05–0.27)

## 2022-03-22 LAB — TROPONIN I (HIGH SENSITIVITY)
Troponin I (High Sensitivity): 11 ng/L (ref <18)
Troponin I (High Sensitivity): 9 ng/L (ref <18)

## 2022-03-22 LAB — LACTIC ACID, PLASMA
Lactic Acid, Venous: 1.4 mmol/L (ref 0.5–1.9)
Lactic Acid, Venous: 1.1 mmol/L (ref 0.5–1.9)

## 2022-03-22 LAB — ABO/RH: ABO/RH(D): A POS

## 2022-03-22 LAB — HIV ANTIBODY (ROUTINE TESTING W REFLEX): HIV Screen 4th Generation wRfx: NONREACTIVE

## 2022-03-22 LAB — I-STAT BETA HCG BLOOD, ED (MC, WL, AP ONLY): I-stat hCG, quantitative: <5 m[IU]/mL (ref <5)

## 2022-03-22 MED ORDER — LACTATED RINGERS IV SOLN: INTRAVENOUS | Status: DC

## 2022-03-22 MED ORDER — DEXTROSE IN LACTATED RINGERS 5 % IV SOLN: INTRAVENOUS | Status: DC

## 2022-03-22 MED ORDER — INSULIN REGULAR(HUMAN) IN NACL 100-0.9 UT/100ML-% IV SOLN
INTRAVENOUS | Status: DC
  Administered 2022-03-22: 13 [IU]/h via INTRAVENOUS
  Administered 2022-03-22: 14 [IU]/h via INTRAVENOUS
  Filled 2022-03-22 (×3): qty 100

## 2022-03-22 MED ORDER — ONDANSETRON HCL 4 MG/2ML IJ SOLN
4.0000 mg | Freq: Three times a day (TID) | INTRAMUSCULAR | Status: DC | PRN
  Administered 2022-03-22 – 2022-03-23 (×2): 4 mg via INTRAVENOUS
  Filled 2022-03-22 (×2): qty 2

## 2022-03-22 MED ORDER — RIVAROXABAN 10 MG PO TABS
10.0000 mg | ORAL_TABLET | Freq: Every day | ORAL | Status: DC
  Administered 2022-03-22: 10 mg via ORAL
  Filled 2022-03-22: qty 1

## 2022-03-22 MED ORDER — PIPERACILLIN-TAZOBACTAM 3.375 G IVPB 30 MIN
3.3750 g | Freq: Once | INTRAVENOUS | Status: AC
  Administered 2022-03-22: 3.375 g via INTRAVENOUS
  Filled 2022-03-22: qty 50

## 2022-03-22 MED ORDER — ACETAMINOPHEN 500 MG PO TABS
1000.0000 mg | ORAL_TABLET | Freq: Once | ORAL | Status: AC
  Administered 2022-03-22: 1000 mg via ORAL
  Filled 2022-03-22: qty 2

## 2022-03-22 MED ORDER — ACETAMINOPHEN 650 MG RE SUPP: 650.0000 mg | Freq: Four times a day (QID) | RECTAL | Status: DC | PRN

## 2022-03-22 MED ORDER — HYDROXYZINE HCL 25 MG PO TABS
25.0000 mg | ORAL_TABLET | Freq: Three times a day (TID) | ORAL | Status: DC | PRN
  Administered 2022-03-22: 25 mg via ORAL
  Filled 2022-03-22: qty 1

## 2022-03-22 MED ORDER — ONDANSETRON HCL 4 MG/2ML IJ SOLN
4.0000 mg | Freq: Once | INTRAMUSCULAR | Status: AC
  Administered 2022-03-22: 4 mg via INTRAVENOUS
  Filled 2022-03-22: qty 2

## 2022-03-22 MED ORDER — SODIUM CHLORIDE 0.9 % IV BOLUS
1000.0000 mL | Freq: Once | INTRAVENOUS | Status: AC
  Administered 2022-03-22: 1000 mL via INTRAVENOUS

## 2022-03-22 MED ORDER — HYDROMORPHONE HCL 1 MG/ML IJ SOLN
0.5000 mg | INTRAMUSCULAR | Status: DC | PRN
  Administered 2022-03-23: 0.5 mg via INTRAVENOUS
  Filled 2022-03-22: qty 0.5

## 2022-03-22 MED ORDER — LINEZOLID 600 MG/300ML IV SOLN
600.0000 mg | Freq: Two times a day (BID) | INTRAVENOUS | Status: AC
  Administered 2022-03-22 – 2022-03-24 (×5): 600 mg via INTRAVENOUS
  Filled 2022-03-22 (×10): qty 300

## 2022-03-22 MED ORDER — PROCHLORPERAZINE EDISYLATE 10 MG/2ML IJ SOLN
10.0000 mg | Freq: Once | INTRAMUSCULAR | Status: AC | PRN
  Administered 2022-03-22: 10 mg via INTRAVENOUS
  Filled 2022-03-22: qty 2

## 2022-03-22 MED ORDER — HEPARIN SODIUM (PORCINE) 5000 UNIT/ML IJ SOLN
5000.0000 [IU] | Freq: Three times a day (TID) | INTRAMUSCULAR | Status: DC
  Administered 2022-03-23 – 2022-03-25 (×6): 5000 [IU] via SUBCUTANEOUS
  Filled 2022-03-22 (×6): qty 1

## 2022-03-22 MED ORDER — DEXTROSE 50 % IV SOLN: 0.0000 mL | INTRAVENOUS | Status: DC | PRN

## 2022-03-22 MED ORDER — HYDROMORPHONE HCL 1 MG/ML IJ SOLN: 0.5000 mg | INTRAMUSCULAR | Status: DC | PRN

## 2022-03-22 MED ORDER — POTASSIUM CHLORIDE 10 MEQ/100ML IV SOLN
10.0000 meq | INTRAVENOUS | Status: AC
  Administered 2022-03-22 – 2022-03-23 (×6): 10 meq via INTRAVENOUS
  Filled 2022-03-22 (×6): qty 100

## 2022-03-22 MED ORDER — OXYCODONE HCL 5 MG PO TABS: 10.0000 mg | ORAL_TABLET | ORAL | Status: DC | PRN

## 2022-03-22 MED ORDER — ACETAMINOPHEN 325 MG PO TABS
650.0000 mg | ORAL_TABLET | Freq: Four times a day (QID) | ORAL | Status: DC | PRN
  Administered 2022-03-23: 650 mg via ORAL
  Filled 2022-03-22: qty 2

## 2022-03-22 MED ORDER — POTASSIUM CHLORIDE 10 MEQ/100ML IV SOLN
10.0000 meq | INTRAVENOUS | Status: AC
  Administered 2022-03-22 (×2): 10 meq via INTRAVENOUS
  Filled 2022-03-22 (×2): qty 100

## 2022-03-22 MED ORDER — METOCLOPRAMIDE HCL 5 MG/ML IJ SOLN
5.0000 mg | Freq: Once | INTRAMUSCULAR | Status: AC | PRN
  Administered 2022-03-22: 5 mg via INTRAVENOUS
  Filled 2022-03-22: qty 2

## 2022-03-22 MED ORDER — OXYCODONE HCL 5 MG PO TABS: 5.0000 mg | ORAL_TABLET | ORAL | Status: DC | PRN

## 2022-03-22 MED ORDER — PIPERACILLIN-TAZOBACTAM 3.375 G IVPB
3.3750 g | Freq: Three times a day (TID) | INTRAVENOUS | Status: AC
  Administered 2022-03-22 – 2022-03-24 (×6): 3.375 g via INTRAVENOUS
  Filled 2022-03-22 (×6): qty 50

## 2022-03-22 NOTE — ED Triage Notes (Signed)
Patient arrived to the ED via EMS. Patient was at the orthopedics office due to getting sick. EMS reports patient had a fever. Patient reports that she is supported to have her foot amputated soon due to foot ulcer. Patient states the orthopedic doctor sent her over here. Patient is A&O x4. Patient states N/V. Patient states she can't keep food down.

## 2022-03-22 NOTE — Progress Notes (Signed)
ANTICOAGULATION CONSULT NOTE - Initial Consult  Pharmacy Consult for Heparin subcutaneous Indication: VTE prophylaxis  Allergies  Allergen Reactions   Lisinopril Cough   Sulfa Antibiotics Other (See Comments)    Makes sick    Patient Measurements: Height: 5\' 7"  (170.2 cm) Weight: 117.9 kg (260 lb) IBW/kg (Calculated) : 61.6 Heparin Dosing Weight: 89.3 kg  Vital Signs: Temp: 99.6 F (37.6 C) (12/12 1956) Temp Source: Oral (12/12 1956) BP: 126/65 (12/12 2000) Pulse Rate: 116 (12/12 2000)  Labs: Recent Labs    03/22/22 1258 03/22/22 1354 03/22/22 1401 03/22/22 1454 03/22/22 1814  HGB 10.0*  --  10.9* 10.5*  --   HCT 30.7*  --  32.0* 31.0*  --   PLT 294  --   --   --   --   CREATININE 0.79  --  0.30*  --  0.51  TROPONINIHS 11 9  --   --   --     Estimated Creatinine Clearance: 116.7 mL/min (by C-G formula based on SCr of 0.51 mg/dL).   Medical History: Past Medical History:  Diagnosis Date   Hyperlipidemia    Hypertension    Obesity    Type 2 diabetes mellitus (HCC)     Medications:  (Not in a hospital admission)   Assessment: 46 yo F admitted with R ankle cellulitis/osteomyelitis concerning for necrotizing fasciitis. Pt was initiated on rivaroxaban 10mg  po daily for VTE ppx on admission and received dose at 18:23 on 03/22/22. Surgery was consulted to evaluate R ankle and is considering surgical intervention. Rivaroxaban discontinued. Pharmacy consulted to transition to SQ heparin for VTE ppx.   Hgb 10.5, Plt 294 on admission  Goal of Therapy:  Monitor platelets by anticoagulation protocol: Yes   Plan:  Start heparin 5000 units subcutaneously q8h on 03/23/22 at 20:00 Monitor CBC daily and for s/sx of bleeding F/u plans for surgery and need to hold anticoagulation  14/12/23, PharmD, BCPS Clinical Pharmacist 03/22/2022 9:09 PM   Please refer to [facility] for pharmacy phone number

## 2022-03-22 NOTE — Progress Notes (Signed)
Pharmacy Antibiotic Note  Lori Schmidt is a 46 y.o. female admitted on 03/22/2022 with R ankle cellulitis/osteomyelitis concerning for necrotizing fasciitis on imaging.  Pharmacy has been consulted for Zosyn (piperacillin-tazobactam) and Linezolid dosing.  WBC 10.6, Tmax 99.1, LA 1.4 SCr 0.30  Zosyn 3.375g IV x1 over 30 minutes given in ED on 12/12 at 13:52  Plan: Continue Zosyn 3.375g IV q8h extended infusion over 4 hours to start 6 hours after ED dose Start linezolid 600mg  IV q12h  Monitor daily CBC, temp, SCr, and for clinical signs of improvement  F/u cultures and de-escalate antibiotics as able  F/u plans for surgery per Dr. on 12/13  Height: 5\' 7"  (170.2 cm) Weight: 117.9 kg (260 lb) IBW/kg (Calculated) : 61.6  Temp (24hrs), Avg:99.2 F (37.3 C), Min:99.1 F (37.3 C), Max:99.2 F (37.3 C)  Recent Labs  Lab 03/22/22 1258 03/22/22 1401  WBC 10.6*  --   CREATININE 0.79 0.30*  LATICACIDVEN 1.4  --     Estimated Creatinine Clearance: 116.7 mL/min (A) (by C-G formula based on SCr of 0.3 mg/dL (L)).    Allergies  Allergen Reactions   Lisinopril Cough   Sulfa Antibiotics Other (See Comments)    Makes sick    Antimicrobials this admission: Zosyn 12/12 >>  Linezolid 12/12 >>   Dose adjustments this admission: N/A  Microbiology results: 12/12 BCx: pending  Thank you for allowing pharmacy to be a part of this patient's care.  14/12/23, PharmD, BCPS Clinical Pharmacist 03/22/2022 5:51 PM   Please refer to [facility] for pharmacy phone number

## 2022-03-22 NOTE — Progress Notes (Addendum)
Office Visit Note   Patient: Lori Schmidt           Date of Birth: 28-Jan-1976           MRN: [id number] Visit Date: 03/22/2022              Requested by: Rana Snare, DO [address] [place],  [place] [address] PCP: Rana Snare, DO  Chief Complaint  Patient presents with   Right Foot - Wound Check      HPI: Patient is a 46 year old woman who presents with increased redness swelling and ulceration right foot.  Patient states she has had nausea, vomiting, chills and sweats for the past 5 days.  Patient states that she has not eaten for 5 days.  She states she has had increased redness and swelling and a large ulcer on the plantar aspect of her right foot as well as an ischemic ulcer over the lateral malleolus.  Patient's hemoglobin A1c was 12.2  two weeks ago.  No new labs today.  Assessment & Plan: Visit Diagnoses:  1. Sepsis, due to unspecified organism, unspecified whether acute organ dysfunction present (HCC)   2. Cutaneous abscess of right foot   3. Subacute osteomyelitis, right ankle and foot (HCC)     Plan: Patient will be [facility] via nonemergent EMS.  She is septic.  Anticipate admission to the hospitalist service stabilized metabolically and start IV antibiotics.  Will plan for a right transtibial amputation Wednesday or Friday when she is medically stable.  Follow-Up Instructions: Return in about 2 weeks (around 04/05/2022).   Ortho Exam  Patient is alert, oriented, no adenopathy, well-dressed, normal affect, normal respiratory effort. Examination patient has increased redness, and  swelling of the right foot and ankle.  She has cellulitis proximal to the ankle.  Previously she had a flat Wagner grade 1 ulcer on the plantar aspect of her foot now she has a large deep tunneling ulcer that extends down to bone and has necrotic soft tissue.  Laterally she has a large ischemic ulcer over the lateral malleolus.  Using the Doppler she has a strong  multiphasic dorsalis pedis pulse with no evidence of acute arterial insufficiency.  Imaging: No results found. No images are attached to the encounter.  Labs: Lab Results  Component Value Date   HGBA1C 12.2 (A) 02/25/2022   HGBA1C 10.1 (H) 10/18/2021   ESRSEDRATE 15 03/22/2021   ESRSEDRATE 25 (H) 03/15/2021   ESRSEDRATE 67 (H) 02/26/2021   CRP 0.7 03/22/2021   CRP 0.9 03/15/2021   CRP 7.5 (H) 02/26/2021   REPTSTATUS 10/21/2021 FINAL 10/16/2021   CULT  10/16/2021    NO GROWTH 5 DAYS Performed at [facility], [address] [place], [place] [address]    [facility] STAPHYLOCOCCUS AUREUS 02/16/2021     Lab Results  Component Value Date   ALBUMIN 3.4 (L) 10/16/2021   ALBUMIN 2.7 (L) 03/22/2021   ALBUMIN 2.8 (L) 03/15/2021    Lab Results  Component Value Date   MG 2.1 02/20/2021   MG 2.0 02/18/2021   No results found for: "VD25OH"  No results found for: "PREALBUMIN"    Latest Ref Rng & Units 10/26/2021    4:42 PM 10/19/2021    1:01 AM 10/18/2021    1:09 AM  CBC EXTENDED  WBC 3.4 - 10.8 x10E3/uL 11.1  7.1  9.6   RBC 3.77 - 5.28 x10E6/uL 4.70  4.02  3.98   Hemoglobin 11.1 - 15.9  g/dL 19.5  09.3  26.7   HCT 34.0 - 46.6 % 41.0  34.4  34.1   Platelets 150 - 450 x10E3/uL 342  265  234   NEUT# 1.4 - 7.0 x10E3/uL 7.7  5.0  7.0   Lymph# 0.7 - 3.1 x10E3/uL 2.5  1.4  1.6      There is no height or weight on file to calculate BMI.  Orders:  No orders of the defined types were placed in this encounter.  No orders of the defined types were placed in this encounter.    Procedures: No procedures performed  Clinical Data: No additional findings.  ROS:  All other systems negative, except as noted in the HPI. Review of Systems  Objective: Vital Signs: There were no vitals taken for this visit.  Specialty Comments:  No specialty comments available.  PMFS History: Patient Active Problem List   Diagnosis Date Noted   Health care maintenance 02/25/2022    Venous insufficiency of right leg 12/01/2021   Leg edema, right 11/17/2021   Encounter for birth control 11/10/2021   Anxiety 10/26/2021   Abscess of skin and subcutaneous tissue 10/16/2021   Dyslipidemia    Hypoalbuminemia due to protein-calorie malnutrition [facility])    Diabetic peripheral neuropathy (HCC)    Acute blood loss anemia    S/P BKA (below knee amputation) unilateral, left (HCC) 03/05/2021   Left below-knee amputee (HCC) 03/05/2021   Adjustment disorder with mixed anxiety and depressed mood    Pressure injury of skin 02/28/2021   Discitis of lumbar region    Diabetes mellitus (HCC)    Left leg cellulitis    Morbid obesity with BMI of 45.0-49.9, adult (HCC) 01/05/2012   Past Medical History:  Diagnosis Date   Hyperlipidemia    Hypertension    Obesity    Type 2 diabetes mellitus (HCC)     Family History  Problem Relation Age of Onset   Hypothyroidism Mother    Diabetes Mother    Diabetes Maternal Grandmother    Breast cancer Paternal Grandmother     Past Surgical History:  Procedure Laterality Date   AMPUTATION Left 02/19/2021   Procedure: AMPUTATION BELOW KNEE;  Surgeon: Nadara Mustard, MD;  Location: [facility] OR;  Service: Orthopedics;  Laterality: Left;   BREAST CYST EXCISION Right 05/2018   four cysts removed on right breast/axilla area   TEE WITHOUT CARDIOVERSION N/A 02/23/2021   Procedure: TRANSESOPHAGEAL ECHOCARDIOGRAM (TEE);  Surgeon: Chrystie Nose, MD;  Location: [facility] ENDOSCOPY;  Service: Cardiovascular;  Laterality: N/A;   Social History   Occupational History   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

## 2022-03-22 NOTE — H&P (Cosign Needed Addendum)
Date: 03/22/2022               Patient Name:  Lori Schmidt MRN: [id number]  DOB: 05-23-1975 Age / Sex: 46 y.o., female   PCP: Rana Snare, DO         Medical Service: Internal Medicine Teaching Service         Attending Physician: Dr. Mikey Bussing, Marthenia Rolling, DO    First Contact: Adron Bene, MD Pager: [place] [phone]  Second Contact: Thalia Bloodgood, DO Pager: Theresa Duty [phone]       After Hours (After 5p/  First Contact Pager: [phone]  weekends / holidays): Second Contact Pager: [phone]   SUBJECTIVE   Chief Complaint: foot injury  History of Present Illness:  46 year old past medical history significant for prior osteomyelitis/discitis requiring administration of long-term antibiotics and BKA of left leg 2022,  and diabetes with associated peripheral neuropathy who was seen at Dr. Audrie Lia office today for right foot infection.  Patient has Charcot foot with chronic ulceration of the right foot dating back to September 2023.  Patient does not have orthotics and stands on her feet for long periods of time throughout the day at her job. Patient has been out of her home medications, including her insulins for roughly the last month due to financial and insurance problems.  Her blood sugar was noted to be elevated on admission, which she reports is typical for her. She has been using antibacterial soaps to help control infection with little benefit.  She has been trying to keep pressure off her foot is much as possible, elevating it when able and using walker to ambulate to the bathroom when she must.  She notes that the ulcer has worsened over the last several days.  Reports that on Thursday leg became swollen and erythematous, did not resolve with elevation of that extremity.  2 to 3 days ago patient noticed black ulceration developed lateral aspect.  Since this time, patient has also had nausea, vomiting, subjective fever and chills.  She has not checked her temperature though.  She has not  been able to take much p.o and feels dehydrated.  No diarrhea, is still urinating normally.  Denies dizziness, lightheadedness, chest pain, shortness of breath.  Due to concern for need for amputation and patient was sent over from orthopedic office for amputation.  Meds:  Current Meds  Medication Sig   Naproxen Sodium (ALEVE PO) Take 4 tablets by mouth 2 (two) times daily as needed (pain).   pregabalin (LYRICA) 100 MG capsule Take 1 capsule (100 mg) by mouth twice daily and 2 capsules (200 mg total) nightly (Patient taking differently: Take 100 mg by mouth 2 (two) times daily. Take 1 capsule (100 mg) by mouth twice daily and 2 capsules (200 mg total) nightly)    Past Medical History:  Diagnosis Date   Hyperlipidemia    Hypertension    Obesity    Type 2 diabetes mellitus (HCC)     Past Surgical History:  Procedure Laterality Date   AMPUTATION Left 02/19/2021   Procedure: AMPUTATION BELOW KNEE;  Surgeon: Nadara Mustard, MD;  Location: [facility] OR;  Service: Orthopedics;  Laterality: Left;   BREAST CYST EXCISION Right 05/2018   four cysts removed on right breast/axilla area   TEE WITHOUT CARDIOVERSION N/A 02/23/2021   Procedure: TRANSESOPHAGEAL ECHOCARDIOGRAM (TEE);  Surgeon: Chrystie Nose, MD;  Location: [facility] ENDOSCOPY;  Service: Cardiovascular;  Laterality: N/A;    Social:  Lives With: Son and daughter Occupation: Floor  manager at [employer] Support: Family Level of Function: Independent in ADLs IADLs, has been using walker recently to avoid pressure on foot PCP: Angelique Blonder, DO Substances: None  Family History:  Family History  Problem Relation Age of Onset   Hypothyroidism Mother    Diabetes Mother    Diabetes Maternal Grandmother    Breast cancer Paternal Grandmother    Allergies: Allergies as of 03/22/2022 - Review Complete 03/22/2022  Allergen Reaction Noted   Lisinopril Cough 11/13/2014   Sulfa antibiotics Other (See Comments) 01/05/2012    Review of  Systems: A complete ROS was negative except as per HPI.   OBJECTIVE:   Physical Exam: Blood pressure (!) 135/122, pulse (!) 110, temperature 99.1 F (37.3 C), temperature source Oral, resp. rate (!) 22, height 5\' 7"  (1.702 m), weight 117.9 kg, SpO2 98 %.  Constitutional: ill-appearing female sitting in bed, in no acute distress HENT: normocephalic atraumatic, dry mm, erythematous tongue Neck: supple Cardiovascular: tachycardic rate, regular rhythm, no m/r/g Pulmonary/Chest: normal work of breathing on room air, lungs clear to auscultation bilaterally Abdominal: soft, non-tender, non-distended MSK: Surgically absent left lower extremity BKA, right lower extremity with edema erythema, Charcot foot deformity, plantar surface ulceration, wound malleolar eschar Neurological: alert & oriented x 3, 5/5 strength in bilateral upper and lower extremities, normal gait Skin: warm and dry, LLE erythema to mid- shin Psych: Mood and affect appropriate  Labs: CBC    Component Value Date/Time   WBC 10.6 (H) 03/22/2022 1258   RBC 3.90 03/22/2022 1258   HGB 10.5 (L) 03/22/2022 1454   HGB 13.2 10/26/2021 1642   HCT 31.0 (L) 03/22/2022 1454   HCT 41.0 10/26/2021 1642   PLT 294 03/22/2022 1258   PLT 342 10/26/2021 1642   MCV 78.7 (L) 03/22/2022 1258   MCV 87 10/26/2021 1642   MCH 25.6 (L) 03/22/2022 1258   MCHC 32.6 03/22/2022 1258   RDW 14.1 03/22/2022 1258   RDW 11.8 10/26/2021 1642   LYMPHSABS 0.7 03/22/2022 1258   LYMPHSABS 2.5 10/26/2021 1642   MONOABS 0.9 03/22/2022 1258   EOSABS 0.0 03/22/2022 1258   EOSABS 0.1 10/26/2021 1642   BASOSABS 0.0 03/22/2022 1258   BASOSABS 0.0 10/26/2021 1642     CMP     Component Value Date/Time   NA 133 (L) 03/22/2022 1454   NA 135 11/17/2021 0946   K 3.5 03/22/2022 1454   CL 97 (L) 03/22/2022 1401   CO2 17 (L) 03/22/2022 1258   GLUCOSE 363 (H) 03/22/2022 1401   BUN 6 03/22/2022 1401   BUN 13 11/17/2021 0946   CREATININE 0.30 (L) 03/22/2022  1401   CALCIUM 8.6 (L) 03/22/2022 1258   PROT 6.9 03/22/2022 1258   ALBUMIN 2.1 (L) 03/22/2022 1258   AST 11 (L) 03/22/2022 1258   ALT 11 03/22/2022 1258   ALKPHOS 88 03/22/2022 1258   BILITOT 1.0 03/22/2022 1258   GFRNONAA >60 03/22/2022 1258   GFRAA >60 05/19/2018 1314    Imaging: DG Foot Complete Right  Result Date: 03/22/2022 CLINICAL DATA:  Ulceration along the foot EXAM: RIGHT FOOT COMPLETE - 3+ VIEW COMPARISON:  12/23/2021 FINDINGS: Diffuse bony demineralization. Fragmentation and dislocation in the midfoot primarily at the Lisfranc joint, with extensive prior fragmentation of the cuboid, cuneiforms, and navicular. Although quite possibly from Charcot arthropathy, the appearance is progressive from 12/23/2021 and there is gas in the soft tissues plantar to the midfoot and also in the soft tissues dorsal to the metatarsals compatible with  substantial soft tissue infection reason the possibility of osteomyelitis for a component of the midfoot destructive findings. Questionable lucency in the talar head medially, cannot exclude talar head involvement. Abnormal gas tracks in the lateral ankle compatible with soft tissue infection extending up in the lateral ankle region. Plantar calcaneal spur noted. IMPRESSION: 1. Progressive fragmentation, destruction, and dislocation in the midfoot primarily at the Lisfranc joint, with extensive prior fragmentation of the cuboid, cuneiforms, and navicular. 2. Gas tracks in the soft tissues plantar to the midfoot and dorsal to the metatarsals compatible with substantial soft tissue infection, and raising the possibility of osteomyelitis as a component of midfoot destructive findings (as opposed to Charcot arthropathy). There is also soft tissue gas in the lateral ankle region. 3. Questionable lucency in the talar head medially, cannot exclude osteomyelitis. 4. Diffuse bony demineralization. Electronically Signed   By: Van Clines M.D.   On: 03/22/2022  13:30   DG Ankle Complete Right  Result Date: 03/22/2022 CLINICAL DATA:  Fever.  Skin ulceration. EXAM: RIGHT ANKLE - COMPLETE 3+ VIEW COMPARISON:  01/04/22 FINDINGS: Pronounced regional soft tissue swelling. Air/gas bubbles within the soft tissues lateral to the ankle region. Chronic neuropathic changes of the midfoot. Osteomyelitis not excluded in that region. IMPRESSION: Pronounced regional soft tissue swelling. Air/gas bubbles within the soft tissues lateral to the ankle region. Chronic neuropathic changes of the midfoot. Osteomyelitis not excluded in that region. Electronically Signed   By: Nelson Chimes M.D.   On: 03/22/2022 13:24    EKG: personally reviewed my interpretation is sinus tach   ASSESSMENT & PLAN:    Assessment & Plan by Problem: Principal Problem:   Osteomyelitis ([place])   Oluwateniola Antione Klepp is a 46 y.o. with pertinent PMH of diabetes with peripheral neuropathy, Charcot foot, prior osteomyelitis and discitis who presented with foot pain/wound and admitted for osteomyelitis on hospital day 0  # Osteomyelitis # Necrotizing fasciitis # Charcot foot Midfoot bone destruction with gas tracking and soft tissues noted in foot and lateral ankle suspicious for osteomyelitis with necrotizing fasciitis There was initial concern for sepsis, however patient is clinically dehydrated and feel that this is likely contributing to her tachycardia.  Blood pressure has stayed stable, she is saturating well on room air, mentating appropriately, renal function preserved.  Does not seem to have endorgan dysfunction at this time. Orthopedics following, planning for transtibial amputation likely Wednesday or Friday ending medical stability.   Patient did receive single dose of Xarelto. Discussed with pharmacy who recommended 24 hr washout period for negligible bleeding risk from medicine. - IV antibiotics Zosyn and linezolid - Blood cultures collected will follow-up - Pain control; tylenol  PO oxycodone, IV dilaudid - ortho following  - Plan for PT OT consult once she has had surgery. - Heparin for DVT prophylaxis  # Suspected early DKA # Uncontrolled diabetes type 2 # Diabetic neuropathy A1c noted to be 12.22 weeks ago. She has not had any of her insulins in the past month. Noted to have hyperglycemia with elevated gap of 20 and glucosuria and ketone.  Suspect patient may be in the early stages of DKA, likely precipitated by her infection. - Endo tool initiated - Follow-up beta hydroxybutyrate, transition off of Endo tool if BHB negative. - Trend BMPs, maintain potassium greater than 3.4 - IV fluids - NPO   # Chronic problems # HLD # Depression  Diet: NPO VTE: Heparin IVF: LR,125cc/hr Code: Full Prior to Admission Living Arrangement: Home, living with family Anticipated Discharge  Location: Home Barriers to Discharge: Medical stability Dispo: Admit patient to Inpatient with expected length of stay greater than 2 midnights.  Signed: Delene Ruffini, MD

## 2022-03-22 NOTE — ED Notes (Signed)
Admitting at bedside 

## 2022-03-22 NOTE — ED Provider Notes (Signed)
[facility] EMERGENCY DEPARTMENT Provider Note   CSN: [id number] Arrival date & time: 03/22/22  1158     History  Chief Complaint  Patient presents with   Foot Injury    Lori Schmidt is a 46 y.o. female.  46 year old female with prior medical history as detailed below presents for evaluation.  Patient sent from Dr. Audrie Lia office.  Patient with right foot infection.  Dr. Lajoyce Corners plans on amputation either tomorrow or potentially on Friday.  Patient sent for IV antibiotics, admission.  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  Patient reports that she has not been taking any medications for the last month secondary to financial issues.  She reports that she is currently uninsured.  She reports 1 week of intermittent nausea and vomiting associated with subjective fevers.  The history is provided by the patient and medical records.       Home Medications Prior to Admission medications   Medication Sig Start Date End Date Taking? Authorizing Provider  acetaminophen (TYLENOL) 325 MG tablet Take 1-2 tablets (325-650 mg total) by mouth every 6 (six) hours as needed for mild pain (pain score 1-3 or temp > 100.5). 03/22/21   Angiulli, Mcarthur Rossetti, PA-C  ascorbic acid (VITAMIN C) 1000 MG tablet Take 1 tablet (1,000 mg total) by mouth daily. Patient not taking: Reported on 10/17/2021 03/22/21   Angiulli, Mcarthur Rossetti, PA-C  citalopram (CELEXA) 40 MG tablet Take 1 tablet (40 mg total) by mouth at bedtime. 04/09/21   Jones Bales, NP  cyanocobalamin 1000 MCG tablet Take 1/2 tablet (500 mcg total) by mouth daily. 03/22/21   Angiulli, Mcarthur Rossetti, PA-C  dapagliflozin propanediol (FARXIGA) 10 MG TABS tablet Take 1 tablet (10 mg total) by mouth daily before breakfast. 02/25/22   Rana Snare, DO  hydrOXYzine (ATARAX) 10 MG tablet Take 1 tablet (10 mg total) by mouth 3 (three) times daily as needed. 10/26/21   Rana Snare, DO  Insulin Glargine-Lixisenatide (SOLIQUA) 100-33 UNT-MCG/ML SOPN Inject 30 Units into the skin daily. 02/25/22   Rana Snare, DO  losartan (COZAAR) 25 MG tablet Take 1 tablet (25 mg total) by mouth at bedtime. 03/22/21 03/22/22  Angiulli, Mcarthur Rossetti, PA-C  magnesium oxide (MAG-OX) 400 MG tablet Take 1 tablet (400 mg total) by mouth daily. 03/22/21   Angiulli, Mcarthur Rossetti, PA-C  medroxyPROGESTERone Acetate (DEPO-PROVERA IM) Inject into the muscle every 3 (three) months. Patient not taking: Reported on 08/17/2021    [provider]  pantoprazole (PROTONIX) 40 MG tablet Take 1 tablet (40 mg total) by mouth daily. Patient not taking: Reported on 08/17/2021 03/22/21   Angiulli, Mcarthur Rossetti, PA-C  pravastatin (PRAVACHOL) 40 MG tablet Take 1 tablet (40 mg total) by mouth daily. 03/22/21 10/17/21  Angiulli, Mcarthur Rossetti, PA-C  pregabalin (LYRICA) 100 MG capsule Take 1 capsule (100 mg) by mouth twice daily and 2 capsules (200 mg total) nightly 12/06/21   Atway, Rayann N, DO      Allergies    Lisinopril and Sulfa antibiotics    Review of Systems   Review of Systems  All other systems reviewed and are negative.   Physical Exam Updated Vital Signs BP 121/76 (BP Location: Right Arm)   Pulse (!) 119   Temp 99.2 F (37.3 C) (Oral)   Resp (!) 23   Ht 5\' 7"  (1.702 m)   Wt 117.9 kg   SpO2 99%   BMI 40.72 kg/m  Physical Exam Vitals and nursing note reviewed.  Constitutional:      General: She is not in acute distress.    Appearance: Normal appearance. She is well-developed.  HENT:     Head: Normocephalic and atraumatic.  Eyes:     Conjunctiva/sclera: Conjunctivae normal.     Pupils: Pupils are equal, round, and reactive to light.  Cardiovascular:     Rate and Rhythm: Regular rhythm. Tachycardia present.     Heart sounds: Normal heart sounds.  Pulmonary:     Effort: Pulmonary effort is normal. No respiratory distress.     Breath sounds: Normal breath sounds.  Abdominal:     General:  There is no distension.     Palpations: Abdomen is soft.     Tenderness: There is no abdominal tenderness.  Musculoskeletal:        General: No deformity. Normal range of motion.     Cervical back: Normal range of motion and neck supple.     Comments:  Right foot and ankle have significant erythema.  This is consistent with cellulitis of the right foot and ankle.  Patient with a deep ulceration on the plantar aspect of her foot tunneling down to bone.  There is an ulceration over the lateral malleolus.    Skin:    General: Skin is warm and dry.  Neurological:     General: No focal deficit present.     Mental Status: She is alert and oriented to person, place, and time.     ED Results / Procedures / Treatments   Labs (all labs ordered are listed, but only abnormal results are displayed) Labs Reviewed  URINALYSIS, ROUTINE W REFLEX MICROSCOPIC - Abnormal; Notable for the following components:      Result Value   Glucose, UA >=500 (*)    Hgb urine dipstick SMALL (*)    Ketones, ur 80 (*)    Protein, ur 30 (*)    Bacteria, UA RARE (*)    All other components within normal limits  CBC WITH DIFFERENTIAL/PLATELET - Abnormal; Notable for the following components:   WBC 10.6 (*)    Hemoglobin 10.0 (*)    HCT 30.7 (*)    MCV 78.7 (*)    MCH 25.6 (*)    Neutro Abs 8.9 (*)    All other components within normal limits  COMPREHENSIVE METABOLIC PANEL - Abnormal; Notable for the following components:   Sodium 134 (*)    Chloride 97 (*)    CO2 17 (*)    Glucose, Bld 363 (*)    Calcium 8.6 (*)    Albumin 2.1 (*)    AST 11 (*)    Anion gap 20 (*)    All other components within normal limits  I-STAT CHEM 8, ED - Abnormal; Notable for the following components:   Sodium 134 (*)    Chloride 97 (*)    Creatinine, Ser 0.30 (*)    Glucose, Bld 363 (*)    TCO2 18 (*)    Hemoglobin 10.9 (*)    HCT 32.0 (*)    All other components within normal limits  I-STAT VENOUS BLOOD GAS, ED -  Abnormal; Notable for the following components:   pCO2, Ven 36.3 (*)    pO2, Ven 55 (*)    Bicarbonate 18.1 (*)    TCO2 19 (*)    Acid-base deficit 8.0 (*)    Sodium 133 (*)    HCT 31.0 (*)    Hemoglobin 10.5 (*)  All other components within normal limits  CBG MONITORING, ED - Abnormal; Notable for the following components:   Glucose-Capillary 313 (*)    All other components within normal limits  CULTURE, BLOOD (ROUTINE X 2)  CULTURE, BLOOD (ROUTINE X 2)  LACTIC ACID, PLASMA  LACTIC ACID, PLASMA  BETA-HYDROXYBUTYRIC ACID  I-STAT BETA HCG BLOOD, ED ([facility], [facility], [facility] ONLY)  I-STAT BETA HCG BLOOD, ED ([facility], [facility], [facility] ONLY)  TYPE AND SCREEN  TROPONIN I (HIGH SENSITIVITY)  TROPONIN I (HIGH SENSITIVITY)    EKG EKG Interpretation  Date/Time:  Tuesday March 22 2022 12:28:57 EST Ventricular Rate:  116 PR Interval:  171 QRS Duration: 100 QT Interval:  335 QTC Calculation: 466 R Axis:   71 Text Interpretation: Sinus tachycardia Low voltage, precordial leads Confirmed by Kristine Royal [phone]) on 03/22/2022 12:32:40 PM  Radiology DG Foot Complete Right  Result Date: 03/22/2022 CLINICAL DATA:  Ulceration along the foot EXAM: RIGHT FOOT COMPLETE - 3+ VIEW COMPARISON:  Jan 05, 2022 FINDINGS: Diffuse bony demineralization. Fragmentation and dislocation in the midfoot primarily at the Lisfranc joint, with extensive prior fragmentation of the cuboid, cuneiforms, and navicular. Although quite possibly from Charcot arthropathy, the appearance is progressive from 01/05/2022 and there is gas in the soft tissues plantar to the midfoot and also in the soft tissues dorsal to the metatarsals compatible with substantial soft tissue infection reason the possibility of osteomyelitis for a component of the midfoot destructive findings. Questionable lucency in the talar head medially, cannot exclude talar head involvement. Abnormal gas tracks in the lateral ankle compatible with soft tissue infection extending up  in the lateral ankle region. Plantar calcaneal spur noted. IMPRESSION: 1. Progressive fragmentation, destruction, and dislocation in the midfoot primarily at the Lisfranc joint, with extensive prior fragmentation of the cuboid, cuneiforms, and navicular. 2. Gas tracks in the soft tissues plantar to the midfoot and dorsal to the metatarsals compatible with substantial soft tissue infection, and raising the possibility of osteomyelitis as a component of midfoot destructive findings (as opposed to Charcot arthropathy). There is also soft tissue gas in the lateral ankle region. 3. Questionable lucency in the talar head medially, cannot exclude osteomyelitis. 4. Diffuse bony demineralization. Electronically Signed   By: Gaylyn Rong M.D.   On: 03/22/2022 13:30   DG Ankle Complete Right  Result Date: 03/22/2022 CLINICAL DATA:  Fever.  Skin ulceration. EXAM: RIGHT ANKLE - COMPLETE 3+ VIEW COMPARISON:  Jan 05, 2022 FINDINGS: Pronounced regional soft tissue swelling. Air/gas bubbles within the soft tissues lateral to the ankle region. Chronic neuropathic changes of the midfoot. Osteomyelitis not excluded in that region. IMPRESSION: Pronounced regional soft tissue swelling. Air/gas bubbles within the soft tissues lateral to the ankle region. Chronic neuropathic changes of the midfoot. Osteomyelitis not excluded in that region. Electronically Signed   By: Paulina Fusi M.D.   On: 03/22/2022 13:24    Procedures Procedures    Medications Ordered in ED Medications  insulin regular, human (MYXREDLIN) 100 units/ 100 mL infusion (has no administration in time range)  lactated ringers infusion (has no administration in time range)  dextrose 5 % in lactated ringers infusion (has no administration in time range)  dextrose 50 % solution 0-50 mL (has no administration in time range)  potassium chloride 10 mEq in 100 mL IVPB (has no administration in time range)  sodium chloride 0.9 % bolus 1,000 mL (has no  administration in time range)  sodium chloride 0.9 % bolus 1,000 mL (0 mLs Intravenous Stopped 03/22/22 1457)  ondansetron (  ZOFRAN) injection 4 mg (4 mg Intravenous Given 03/22/22 1349)  piperacillin-tazobactam (ZOSYN) IVPB 3.375 g (0 g Intravenous Stopped 03/22/22 1422)  acetaminophen (TYLENOL) tablet 1,000 mg (1,000 mg Oral Given 03/22/22 1237)    ED Course/ Medical Decision Making/ A&P                           Medical Decision Making Amount and/or Complexity of Data Reviewed Labs: ordered. Radiology: ordered.  Risk OTC drugs. Prescription drug management. Decision regarding hospitalization.    Medical Screen Complete  This patient presented to the ED with complaint of right foot diabetic infection.  This complaint involves an extensive number of treatment options. The initial differential diagnosis includes, but is not limited to, infection, sepsis, metabolic abnormality, etc.  This presentation is: Acute, Chronic, Self-Limited, Previously Undiagnosed, Uncertain Prognosis, Complicated, Systemic Symptoms, and Threat to Life/Bodily Function  Patient with uncontrolled diabetes and worsening infection of the right foot presents from Dr. Audrie Liauda's orthopedic office.  Patient is to be admitted for IV antibiotics.  Dr. Lajoyce Cornersuda anticipates transtibial amputation Wednesday or Friday.  Patient with clear evidence of dehydration and hyperglycemia on initial evaluation.  Notable lab abnormalities include sodium of 134, CO2 of 17, glucose of 363, BUN of 6, creatinine of 0.79, and anion gap of 20.  Lactic acid is 1.4.  Patient's white count is 10.6.  Zosyn initiated in the ED.  IV fluids, insulin drip initiated in the ED.  Patient is followed by internal medicine teaching service.  They are aware of case and will evaluate for admission.      Co morbidities that complicated the patient's evaluation  Uncontrolled diabetes   Additional history obtained:  External records from  outside sources obtained and reviewed including prior ED visits and prior Inpatient records.    Lab Tests:  I ordered and personally interpreted labs.  The pertinent results include: CBC, CMP, lactic acid, troponin, i-STAT Chem-8, UA   Imaging Studies ordered:  I ordered imaging studies including plain films of right foot I independently visualized and interpreted obtained imaging which showed findings concerning for significant cellulitis of the right foot and right ankle with deep tissue gas present I agree with the radiologist interpretation.   Cardiac Monitoring:  The patient was maintained on a cardiac monitor.  I personally viewed and interpreted the cardiac monitor which showed an underlying rhythm of: Sinus tach   Medicines ordered:  I ordered medication including IV fluids, antibiotics, insulin drip for dehydration, infection, hyperglycemia Reevaluation of the patient after these medicines showed that the patient: improved  Problem List / ED Course:  Right foot diabetic foot infection, hyperglycemia, dehydration, open anion gap   Reevaluation:  After the interventions noted above, I reevaluated the patient and found that they have: improved   Disposition:  After consideration of the diagnostic results and the patients response to treatment, I feel that the patent would benefit from admission.    CRITICAL CARE Performed by: Wynetta FinesPeter C Talon Witting   Total critical care time: 30 minutes  Critical care time was exclusive of separately billable procedures and treating other patients.  Critical care was necessary to treat or prevent imminent or life-threatening deterioration.  Critical care was time spent personally by me on the following activities: development of treatment plan with patient and/or surrogate as well as nursing, discussions with consultants, evaluation of patient's response to treatment, examination of patient, obtaining history from patient or  surrogate, ordering and performing treatments  and interventions, ordering and review of laboratory studies, ordering and review of radiographic studies, pulse oximetry and re-evaluation of patient's condition.         Final Clinical Impression(s) / ED Diagnoses Final diagnoses:  Cellulitis, unspecified cellulitis site  Hyperglycemia    Rx / DC Orders ED Discharge Orders     None         Wynetta Fines, MD 03/22/22 [phone]

## 2022-03-23 ENCOUNTER — Other Ambulatory Visit: Payer: Self-pay

## 2022-03-23 ENCOUNTER — Inpatient Hospital Stay (HOSPITAL_COMMUNITY): Payer: Medicaid Other | Admitting: Anesthesiology

## 2022-03-23 ENCOUNTER — Encounter (HOSPITAL_COMMUNITY)
Admission: EM | Disposition: A | Discharge status: Home Health | Payer: Self-pay | Source: Home / Self Care | Attending: Internal Medicine

## 2022-03-23 ENCOUNTER — Encounter (HOSPITAL_COMMUNITY): Payer: Self-pay | Admitting: Internal Medicine

## 2022-03-23 DIAGNOSIS — Z794 Long term (current) use of insulin: Secondary | ICD-10-CM

## 2022-03-23 DIAGNOSIS — A5216 Charcot's arthropathy (tabetic): Secondary | ICD-10-CM

## 2022-03-23 DIAGNOSIS — L02611 Cutaneous abscess of right foot: Secondary | ICD-10-CM

## 2022-03-23 DIAGNOSIS — E43 Unspecified severe protein-calorie malnutrition: Secondary | ICD-10-CM

## 2022-03-23 DIAGNOSIS — I10 Essential (primary) hypertension: Secondary | ICD-10-CM

## 2022-03-23 DIAGNOSIS — E11628 Type 2 diabetes mellitus with other skin complications: Secondary | ICD-10-CM

## 2022-03-23 DIAGNOSIS — E1169 Type 2 diabetes mellitus with other specified complication: Secondary | ICD-10-CM

## 2022-03-23 DIAGNOSIS — Z87891 Personal history of nicotine dependence: Secondary | ICD-10-CM

## 2022-03-23 DIAGNOSIS — M869 Osteomyelitis, unspecified: Secondary | ICD-10-CM

## 2022-03-23 DIAGNOSIS — E1110 Type 2 diabetes mellitus with ketoacidosis without coma: Secondary | ICD-10-CM | POA: Diagnosis present

## 2022-03-23 DIAGNOSIS — M86271 Subacute osteomyelitis, right ankle and foot: Secondary | ICD-10-CM

## 2022-03-23 HISTORY — PX: AMPUTATION: SHX166

## 2022-03-23 LAB — BASIC METABOLIC PANEL
Anion gap: 11 (ref 5–15)
Anion gap: 11 (ref 5–15)
BUN: 8 mg/dL (ref 6–20)
BUN: 8 mg/dL (ref 6–20)
CO2: 22 mmol/L (ref 22–32)
CO2: 22 mmol/L (ref 22–32)
Calcium: 8.1 mg/dL — ABNORMAL LOW (ref 8.9–10.3)
Calcium: 8.3 mg/dL — ABNORMAL LOW (ref 8.9–10.3)
Chloride: 102 mmol/L (ref 98–111)
Chloride: 102 mmol/L (ref 98–111)
Creatinine, Ser: 0.46 mg/dL (ref 0.44–1.00)
Creatinine, Ser: 0.54 mg/dL (ref 0.44–1.00)
GFR, Estimated: >60 mL/min (ref >60)
GFR, Estimated: >60 mL/min (ref >60)
Glucose, Bld: 239 mg/dL — ABNORMAL HIGH (ref 70–99)
Glucose, Bld: 227 mg/dL — ABNORMAL HIGH (ref 70–99)
Potassium: 3.4 mmol/L — ABNORMAL LOW (ref 3.5–5.1)
Potassium: 3.2 mmol/L — ABNORMAL LOW (ref 3.5–5.1)
Sodium: 135 mmol/L (ref 135–145)
Sodium: 135 mmol/L (ref 135–145)

## 2022-03-23 LAB — CBC
HCT: 25.8 % — ABNORMAL LOW (ref 36.0–46.0)
Hemoglobin: 8.9 g/dL — ABNORMAL LOW (ref 12.0–15.0)
MCH: 26 pg (ref 26.0–34.0)
MCHC: 34.5 g/dL (ref 30.0–36.0)
MCV: 75.4 fL — ABNORMAL LOW (ref 80.0–100.0)
Platelets: 267 10*3/uL (ref 150–400)
RBC: 3.42 MIL/uL — ABNORMAL LOW (ref 3.87–5.11)
RDW: 14.2 % (ref 11.5–15.5)
WBC: 7.4 10*3/uL (ref 4.0–10.5)
nRBC: 0 % (ref 0.0–0.2)

## 2022-03-23 LAB — CBG MONITORING, ED
Glucose-Capillary: 169 mg/dL — ABNORMAL HIGH (ref 70–99)
Glucose-Capillary: 183 mg/dL — ABNORMAL HIGH (ref 70–99)
Glucose-Capillary: 187 mg/dL — ABNORMAL HIGH (ref 70–99)
Glucose-Capillary: 201 mg/dL — ABNORMAL HIGH (ref 70–99)
Glucose-Capillary: 204 mg/dL — ABNORMAL HIGH (ref 70–99)
Glucose-Capillary: 204 mg/dL — ABNORMAL HIGH (ref 70–99)
Glucose-Capillary: 217 mg/dL — ABNORMAL HIGH (ref 70–99)
Glucose-Capillary: 228 mg/dL — ABNORMAL HIGH (ref 70–99)
Glucose-Capillary: 231 mg/dL — ABNORMAL HIGH (ref 70–99)
Glucose-Capillary: 232 mg/dL — ABNORMAL HIGH (ref 70–99)
Glucose-Capillary: 252 mg/dL — ABNORMAL HIGH (ref 70–99)
Glucose-Capillary: 313 mg/dL — ABNORMAL HIGH (ref 70–99)

## 2022-03-23 LAB — GLUCOSE, CAPILLARY
Glucose-Capillary: 180 mg/dL — ABNORMAL HIGH (ref 70–99)
Glucose-Capillary: 212 mg/dL — ABNORMAL HIGH (ref 70–99)
Glucose-Capillary: 182 mg/dL — ABNORMAL HIGH (ref 70–99)
Glucose-Capillary: 215 mg/dL — ABNORMAL HIGH (ref 70–99)

## 2022-03-23 LAB — SURGICAL PCR SCREEN
MRSA, PCR: NEGATIVE
Staphylococcus aureus: POSITIVE — AB

## 2022-03-23 SURGERY — AMPUTATION BELOW KNEE
Anesthesia: Monitor Anesthesia Care | Site: Knee | Laterality: Right

## 2022-03-23 MED ORDER — DEXAMETHASONE SODIUM PHOSPHATE 10 MG/ML IJ SOLN
INTRAMUSCULAR | Status: AC
  Filled 2022-03-23: qty 1

## 2022-03-23 MED ORDER — BUPIVACAINE HCL (PF) 0.5 % IJ SOLN
INTRAMUSCULAR | Status: DC | PRN
  Administered 2022-03-23 (×2): 15 mL via PERINEURAL

## 2022-03-23 MED ORDER — LACTATED RINGERS IV SOLN: INTRAVENOUS | Status: DC

## 2022-03-23 MED ORDER — BUPIVACAINE LIPOSOME 1.3 % IJ SUSP
INTRAMUSCULAR | Status: DC | PRN
  Administered 2022-03-23: 10 mL via PERINEURAL

## 2022-03-23 MED ORDER — CHLORHEXIDINE GLUCONATE 0.12 % MT SOLN
15.0000 mL | Freq: Once | OROMUCOSAL | Status: AC
  Administered 2022-03-23: 15 mL via OROMUCOSAL
  Filled 2022-03-23: qty 15

## 2022-03-23 MED ORDER — ONDANSETRON HCL 4 MG/2ML IJ SOLN: 4.0000 mg | Freq: Once | INTRAMUSCULAR | Status: DC | PRN

## 2022-03-23 MED ORDER — LACTATED RINGERS IV BOLUS
1000.0000 mL | Freq: Once | INTRAVENOUS | Status: AC
  Administered 2022-03-23: 1000 mL via INTRAVENOUS

## 2022-03-23 MED ORDER — OXYCODONE HCL 5 MG/5ML PO SOLN: 5.0000 mg | Freq: Once | ORAL | Status: DC | PRN

## 2022-03-23 MED ORDER — CEFAZOLIN SODIUM-DEXTROSE 2-4 GM/100ML-% IV SOLN: 2.0000 g | Freq: Three times a day (TID) | INTRAVENOUS | Status: DC

## 2022-03-23 MED ORDER — POTASSIUM CHLORIDE 10 MEQ/100ML IV SOLN
10.0000 meq | INTRAVENOUS | Status: AC
  Administered 2022-03-23 (×4): 10 meq via INTRAVENOUS
  Filled 2022-03-23 (×4): qty 100

## 2022-03-23 MED ORDER — JUVEN PO PACK
1.0000 | PACK | Freq: Two times a day (BID) | ORAL | Status: DC
  Administered 2022-03-24: 1 via ORAL
  Filled 2022-03-23 (×2): qty 1

## 2022-03-23 MED ORDER — ALUM & MAG HYDROXIDE-SIMETH 200-200-20 MG/5ML PO SUSP: 15.0000 mL | ORAL | Status: DC | PRN

## 2022-03-23 MED ORDER — CHLORHEXIDINE GLUCONATE 0.12 % MT SOLN
OROMUCOSAL | Status: AC
  Filled 2022-03-23: qty 15

## 2022-03-23 MED ORDER — MAGNESIUM CITRATE PO SOLN: 1.0000 | Freq: Once | ORAL | Status: DC | PRN

## 2022-03-23 MED ORDER — PROPOFOL 500 MG/50ML IV EMUL
INTRAVENOUS | Status: DC | PRN
  Administered 2022-03-23: 150 ug/kg/min via INTRAVENOUS

## 2022-03-23 MED ORDER — HYDROMORPHONE HCL 1 MG/ML IJ SOLN: 0.5000 mg | INTRAMUSCULAR | Status: DC | PRN

## 2022-03-23 MED ORDER — PROCHLORPERAZINE EDISYLATE 10 MG/2ML IJ SOLN: 10.0000 mg | Freq: Four times a day (QID) | INTRAMUSCULAR | Status: DC | PRN

## 2022-03-23 MED ORDER — TRANEXAMIC ACID-NACL 1000-0.7 MG/100ML-% IV SOLN: 1000.0000 mg | INTRAVENOUS | Status: DC

## 2022-03-23 MED ORDER — HYDRALAZINE HCL 20 MG/ML IJ SOLN: 5.0000 mg | INTRAMUSCULAR | Status: DC | PRN

## 2022-03-23 MED ORDER — FENTANYL CITRATE PF 50 MCG/ML IJ SOSY
50.0000 ug | PREFILLED_SYRINGE | Freq: Once | INTRAMUSCULAR | Status: AC
  Administered 2022-03-23: 50 ug via INTRAVENOUS
  Filled 2022-03-23: qty 1

## 2022-03-23 MED ORDER — POVIDONE-IODINE 10 % EX SWAB
2.0000 | Freq: Once | CUTANEOUS | Status: AC
  Administered 2022-03-23: 2 via TOPICAL

## 2022-03-23 MED ORDER — OXYCODONE HCL 5 MG PO TABS
10.0000 mg | ORAL_TABLET | ORAL | Status: DC | PRN
  Administered 2022-03-24: 15 mg via ORAL
  Filled 2022-03-23: qty 2
  Filled 2022-03-23: qty 3

## 2022-03-23 MED ORDER — SODIUM CHLORIDE 0.9 % IV SOLN: INTRAVENOUS | Status: DC

## 2022-03-23 MED ORDER — PHENYLEPHRINE HCL (PRESSORS) 10 MG/ML IV SOLN
INTRAVENOUS | Status: DC | PRN
  Administered 2022-03-23 (×2): 160 ug via INTRAVENOUS

## 2022-03-23 MED ORDER — TRANEXAMIC ACID-NACL 1000-0.7 MG/100ML-% IV SOLN
INTRAVENOUS | Status: DC | PRN
  Administered 2022-03-23: 1000 mg via INTRAVENOUS

## 2022-03-23 MED ORDER — FENTANYL CITRATE (PF) 250 MCG/5ML IJ SOLN
INTRAMUSCULAR | Status: DC | PRN
  Administered 2022-03-23: 150 ug via INTRAVENOUS
  Administered 2022-03-23: 100 ug via INTRAVENOUS

## 2022-03-23 MED ORDER — AMISULPRIDE (ANTIEMETIC) 5 MG/2ML IV SOLN: 10.0000 mg | Freq: Once | INTRAVENOUS | Status: DC | PRN

## 2022-03-23 MED ORDER — VITAMIN C 500 MG PO TABS
1000.0000 mg | ORAL_TABLET | Freq: Every day | ORAL | Status: DC
  Administered 2022-03-23 – 2022-03-25 (×3): 1000 mg via ORAL
  Filled 2022-03-23 (×3): qty 2

## 2022-03-23 MED ORDER — DROPERIDOL 2.5 MG/ML IJ SOLN: 1.2500 mg | INTRAMUSCULAR | Status: DC | PRN

## 2022-03-23 MED ORDER — FENTANYL CITRATE PF 50 MCG/ML IJ SOSY: 50.0000 ug | PREFILLED_SYRINGE | Freq: Once | INTRAMUSCULAR | Status: DC

## 2022-03-23 MED ORDER — OXYCODONE HCL 5 MG PO TABS
5.0000 mg | ORAL_TABLET | ORAL | Status: DC | PRN
  Administered 2022-03-23 – 2022-03-24 (×2): 10 mg via ORAL
  Administered 2022-03-24: 5 mg via ORAL
  Filled 2022-03-23 (×2): qty 2

## 2022-03-23 MED ORDER — POLYETHYLENE GLYCOL 3350 17 G PO PACK: 17.0000 g | PACK | Freq: Every day | ORAL | Status: DC | PRN

## 2022-03-23 MED ORDER — INSULIN ASPART 100 UNIT/ML IJ SOLN
0.0000 [IU] | INTRAMUSCULAR | Status: DC
  Administered 2022-03-23: 4 [IU] via SUBCUTANEOUS
  Administered 2022-03-23 (×2): 7 [IU] via SUBCUTANEOUS
  Administered 2022-03-24: 4 [IU] via SUBCUTANEOUS
  Administered 2022-03-24: 11 [IU] via SUBCUTANEOUS
  Administered 2022-03-24: 7 [IU] via SUBCUTANEOUS
  Administered 2022-03-24: 11 [IU] via SUBCUTANEOUS

## 2022-03-23 MED ORDER — GUAIFENESIN-DM 100-10 MG/5ML PO SYRP: 15.0000 mL | ORAL_SOLUTION | ORAL | Status: DC | PRN

## 2022-03-23 MED ORDER — PHENYLEPHRINE 80 MCG/ML (10ML) SYRINGE FOR IV PUSH (FOR BLOOD PRESSURE SUPPORT)
PREFILLED_SYRINGE | INTRAVENOUS | Status: AC
  Filled 2022-03-23: qty 10

## 2022-03-23 MED ORDER — MIDAZOLAM HCL 2 MG/2ML IJ SOLN
INTRAMUSCULAR | Status: AC
  Filled 2022-03-23: qty 2

## 2022-03-23 MED ORDER — ACETAMINOPHEN 500 MG PO TABS
1000.0000 mg | ORAL_TABLET | Freq: Once | ORAL | Status: AC
  Administered 2022-03-23: 1000 mg via ORAL

## 2022-03-23 MED ORDER — POTASSIUM CHLORIDE CRYS ER 20 MEQ PO TBCR: 20.0000 meq | EXTENDED_RELEASE_TABLET | Freq: Every day | ORAL | Status: DC | PRN

## 2022-03-23 MED ORDER — 0.9 % SODIUM CHLORIDE (POUR BTL) OPTIME
TOPICAL | Status: DC | PRN
  Administered 2022-03-23: 1000 mL

## 2022-03-23 MED ORDER — POTASSIUM CHLORIDE 10 MEQ/100ML IV SOLN: 10.0000 meq | INTRAVENOUS | Status: DC

## 2022-03-23 MED ORDER — FENTANYL CITRATE (PF) 250 MCG/5ML IJ SOLN
INTRAMUSCULAR | Status: AC
  Filled 2022-03-23: qty 5

## 2022-03-23 MED ORDER — INSULIN GLARGINE-YFGN 100 UNIT/ML ~~LOC~~ SOLN
15.0000 [IU] | Freq: Every day | SUBCUTANEOUS | Status: DC
  Administered 2022-03-23: 15 [IU] via SUBCUTANEOUS
  Filled 2022-03-23 (×2): qty 0.15

## 2022-03-23 MED ORDER — OXYCODONE HCL 5 MG PO TABS: 5.0000 mg | ORAL_TABLET | Freq: Once | ORAL | Status: DC | PRN

## 2022-03-23 MED ORDER — PANTOPRAZOLE SODIUM 40 MG PO TBEC
40.0000 mg | DELAYED_RELEASE_TABLET | Freq: Every day | ORAL | Status: DC
  Administered 2022-03-23 – 2022-03-25 (×3): 40 mg via ORAL
  Filled 2022-03-23 (×3): qty 1

## 2022-03-23 MED ORDER — MAGNESIUM SULFATE 2 GM/50ML IV SOLN: 2.0000 g | Freq: Every day | INTRAVENOUS | Status: DC | PRN

## 2022-03-23 MED ORDER — ONDANSETRON HCL 4 MG/2ML IJ SOLN
INTRAMUSCULAR | Status: AC
  Filled 2022-03-23: qty 4

## 2022-03-23 MED ORDER — FENTANYL CITRATE (PF) 100 MCG/2ML IJ SOLN: 25.0000 ug | INTRAMUSCULAR | Status: DC | PRN

## 2022-03-23 MED ORDER — EPHEDRINE 5 MG/ML INJ
INTRAVENOUS | Status: AC
  Filled 2022-03-23: qty 5

## 2022-03-23 MED ORDER — PHENOL 1.4 % MT LIQD: 1.0000 | OROMUCOSAL | Status: DC | PRN

## 2022-03-23 MED ORDER — ONDANSETRON HCL 4 MG/2ML IJ SOLN
4.0000 mg | Freq: Four times a day (QID) | INTRAMUSCULAR | Status: DC | PRN
  Administered 2022-03-23: 4 mg via INTRAVENOUS
  Filled 2022-03-23: qty 2

## 2022-03-23 MED ORDER — ACETAMINOPHEN 500 MG PO TABS
ORAL_TABLET | ORAL | Status: AC
  Filled 2022-03-23: qty 2

## 2022-03-23 MED ORDER — CEFAZOLIN SODIUM-DEXTROSE 2-4 GM/100ML-% IV SOLN
2.0000 g | INTRAVENOUS | Status: AC
  Administered 2022-03-23: 2 g via INTRAVENOUS

## 2022-03-23 MED ORDER — DOCUSATE SODIUM 100 MG PO CAPS
100.0000 mg | ORAL_CAPSULE | Freq: Every day | ORAL | Status: DC
  Administered 2022-03-24 – 2022-03-25 (×2): 100 mg via ORAL
  Filled 2022-03-23 (×2): qty 1

## 2022-03-23 MED ORDER — LABETALOL HCL 5 MG/ML IV SOLN: 10.0000 mg | INTRAVENOUS | Status: DC | PRN

## 2022-03-23 MED ORDER — METOPROLOL TARTRATE 5 MG/5ML IV SOLN: 2.0000 mg | INTRAVENOUS | Status: DC | PRN

## 2022-03-23 MED ORDER — LIDOCAINE 2% (20 MG/ML) 5 ML SYRINGE
INTRAMUSCULAR | Status: AC
  Filled 2022-03-23: qty 15

## 2022-03-23 MED ORDER — ZINC SULFATE 220 (50 ZN) MG PO CAPS
220.0000 mg | ORAL_CAPSULE | Freq: Every day | ORAL | Status: DC
  Administered 2022-03-23 – 2022-03-25 (×3): 220 mg via ORAL
  Filled 2022-03-23 (×3): qty 1

## 2022-03-23 MED ORDER — ORAL CARE MOUTH RINSE: 15.0000 mL | Freq: Once | OROMUCOSAL | Status: AC

## 2022-03-23 MED ORDER — TRANEXAMIC ACID-NACL 1000-0.7 MG/100ML-% IV SOLN
INTRAVENOUS | Status: AC
  Filled 2022-03-23: qty 100

## 2022-03-23 MED ORDER — BISACODYL 5 MG PO TBEC: 5.0000 mg | DELAYED_RELEASE_TABLET | Freq: Every day | ORAL | Status: DC | PRN

## 2022-03-23 MED ORDER — CEFAZOLIN SODIUM-DEXTROSE 2-4 GM/100ML-% IV SOLN
INTRAVENOUS | Status: AC
  Filled 2022-03-23: qty 100

## 2022-03-23 MED ORDER — FENTANYL CITRATE (PF) 100 MCG/2ML IJ SOLN
INTRAMUSCULAR | Status: AC
  Filled 2022-03-23: qty 2

## 2022-03-23 MED ORDER — DROPERIDOL 2.5 MG/ML IJ SOLN: 1.2500 mg | Freq: Once | INTRAMUSCULAR | Status: DC

## 2022-03-23 MED ORDER — CHLORHEXIDINE GLUCONATE 4 % EX LIQD: 60.0000 mL | Freq: Once | CUTANEOUS | Status: DC

## 2022-03-23 MED ORDER — MIDAZOLAM HCL 2 MG/2ML IJ SOLN
2.0000 mg | Freq: Once | INTRAMUSCULAR | Status: AC
  Administered 2022-03-23: 2 mg via INTRAVENOUS

## 2022-03-23 MED ORDER — ACETAMINOPHEN 325 MG PO TABS: 325.0000 mg | ORAL_TABLET | Freq: Four times a day (QID) | ORAL | Status: DC | PRN

## 2022-03-23 SURGICAL SUPPLY — 41 items
BAG COUNTER SPONGE SURGICOUNT (BAG) IMPLANT
BAG SPNG CNTER NS LX DISP (BAG)
BLADE SAW RECIP 87.9 MT (BLADE) ×1 IMPLANT
BLADE SURG 21 STRL SS (BLADE) ×1 IMPLANT
BNDG COHESIVE 6X5 TAN STRL LF (GAUZE/BANDAGES/DRESSINGS) IMPLANT
CANISTER WOUND CARE 500ML ATS (WOUND CARE) ×1 IMPLANT
COVER SURGICAL LIGHT HANDLE (MISCELLANEOUS) ×1 IMPLANT
CUFF TOURN SGL QUICK 34 (TOURNIQUET CUFF) ×1
CUFF TRNQT CYL 34X4.125X (TOURNIQUET CUFF) ×1 IMPLANT
DRAPE DERMATAC (DRAPES) IMPLANT
DRAPE INCISE IOBAN 66X45 STRL (DRAPES) ×1 IMPLANT
DRAPE U-SHAPE 47X51 STRL (DRAPES) ×1 IMPLANT
DRESSING PREVENA PLUS CUSTOM (GAUZE/BANDAGES/DRESSINGS) ×1 IMPLANT
DRSG PREVENA PLUS CUSTOM (GAUZE/BANDAGES/DRESSINGS) ×1
DURAPREP 26ML APPLICATOR (WOUND CARE) ×1 IMPLANT
ELECT REM PT RETURN 9FT ADLT (ELECTROSURGICAL) ×1
ELECTRODE REM PT RTRN 9FT ADLT (ELECTROSURGICAL) ×1 IMPLANT
GLOVE BIOGEL PI IND STRL 9 (GLOVE) ×1 IMPLANT
GLOVE SURG ORTHO 9.0 STRL STRW (GLOVE) ×1 IMPLANT
GOWN STRL REUS W/ TWL XL LVL3 (GOWN DISPOSABLE) ×2 IMPLANT
GOWN STRL REUS W/TWL XL LVL3 (GOWN DISPOSABLE) ×2
GRAFT SKIN WND MICRO 38 (Tissue) IMPLANT
GRAFT SKIN WND OMEGA3 SB 7X10 (Tissue) IMPLANT
KIT BASIN OR (CUSTOM PROCEDURE TRAY) ×1 IMPLANT
KIT TURNOVER KIT B (KITS) ×1 IMPLANT
MANIFOLD NEPTUNE II (INSTRUMENTS) ×1 IMPLANT
NS IRRIG 1000ML POUR BTL (IV SOLUTION) ×1 IMPLANT
PACK ORTHO EXTREMITY (CUSTOM PROCEDURE TRAY) ×1 IMPLANT
PAD ARMBOARD 7.5X6 YLW CONV (MISCELLANEOUS) ×1 IMPLANT
PREVENA RESTOR ARTHOFORM 46X30 (CANNISTER) ×1 IMPLANT
PREVENA RESTOR AXIOFORM 29X28 (GAUZE/BANDAGES/DRESSINGS) IMPLANT
SPONGE T-LAP 18X18 ~~LOC~~+RFID (SPONGE) IMPLANT
STAPLER VISISTAT 35W (STAPLE) IMPLANT
STOCKINETTE IMPERVIOUS LG (DRAPES) ×1 IMPLANT
SUT ETHILON 2 0 PSLX (SUTURE) IMPLANT
SUT SILK 2 0 (SUTURE) ×1
SUT SILK 2-0 18XBRD TIE 12 (SUTURE) ×1 IMPLANT
SUT VIC AB 1 CTX 27 (SUTURE) ×2 IMPLANT
TOWEL GREEN STERILE (TOWEL DISPOSABLE) ×1 IMPLANT
TUBE CONNECTING 12X1/4 (SUCTIONS) ×1 IMPLANT
YANKAUER SUCT BULB TIP NO VENT (SUCTIONS) ×1 IMPLANT

## 2022-03-23 NOTE — Anesthesia Procedure Notes (Signed)
Anesthesia Regional Block: Popliteal block   Pre-Anesthetic Checklist: , timeout performed,  Correct Patient, Correct Site, Correct Laterality,  Correct Procedure, Correct Position, site marked,  Risks and benefits discussed,  Surgical consent,  Pre-op evaluation,  At surgeon's request and post-op pain management  Laterality: Right  Prep: chloraprep       Needles:  Injection technique: Single-shot  Needle Type: Echogenic Stimulator Needle     Needle Length: 10cm  Needle Gauge: 20     Additional Needles:   Procedures:,,,, ultrasound used (permanent image in chart),,    Narrative:  Start time: 03/23/2022 12:28 PM End time: 03/23/2022 12:34 PM  Performed by: Personally  Anesthesiologist: Lucretia Kern, MD  Additional Notes: Standard monitors applied. Skin prepped. Good needle visualization with ultrasound. Injection made in 5cc increments with no resistance to injection. Patient tolerated the procedure well.

## 2022-03-23 NOTE — Consult Note (Signed)
ORTHOPAEDIC CONSULTATION  REQUESTING PHYSICIAN: Gust Rung, DO  Chief Complaint: Ulceration abscess redness and swelling right foot  HPI: Lori Schmidt is a 46 y.o. female who presents  with increased redness swelling and ulceration right foot.  Patient states she has had nausea, vomiting, chills and sweats for the past 5 days.  Patient states that she has not eaten for 5 days.  She states she has had increased redness and swelling and a large ulcer on the plantar aspect of her right foot as well as an ischemic ulcer over the lateral malleolus.  Patient's hemoglobin A1c was 12.2  two weeks ago.   Past Medical History:  Diagnosis Date   Hyperlipidemia    Hypertension    Obesity    Type 2 diabetes mellitus (HCC)    Past Surgical History:  Procedure Laterality Date   AMPUTATION Left 02/19/2021   Procedure: AMPUTATION BELOW KNEE;  Surgeon: Nadara Mustard, MD;  Location: [facility] OR;  Service: Orthopedics;  Laterality: Left;   BREAST CYST EXCISION Right 05/2018   four cysts removed on right breast/axilla area   TEE WITHOUT CARDIOVERSION N/A 02/23/2021   Procedure: TRANSESOPHAGEAL ECHOCARDIOGRAM (TEE);  Surgeon: Chrystie Nose, MD;  Location: [facility] ENDOSCOPY;  Service: Cardiovascular;  Laterality: N/A;   Social History   Socioeconomic History   Marital status: Single    Spouse name: Not on file   Number of children: 2   Years of education: Not on file   Highest education level: Not on file  Occupational History   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  Mother is Tally Due   Social Determinants of Health   Financial Resource Strain: Not on file  Food Insecurity: No Food Insecurity (02/25/2022)   Hunger Vital Sign    Worried About Running Out of Food in the Last Year: Never true    Ran Out of Food in the Last Year: Never true  Transportation Needs: No Transportation Needs (02/25/2022)   PRAPARE - Administrator, Civil Service (Medical): No    Lack of Transportation (Non-Medical): No  Physical Activity: Not on file  Stress: Not on file  Social Connections: Moderately Isolated (02/25/2022)   Social Connection and Isolation Panel [NHANES]    Frequency of Communication with Friends and Family: More than three times a week    Frequency of Social Gatherings with Friends and Family: Once a week    Attends Religious Services: Never    Database administrator or Organizations: Yes    Attends Banker Meetings: Never    Marital Status: Divorced   Family History  Problem Relation Age of Onset   Hypothyroidism Mother    Diabetes Mother    Diabetes Maternal Grandmother    Breast cancer Paternal Grandmother    - negative except otherwise stated in the family history section Allergies  Allergen Reactions   Lisinopril Cough   Sulfa Antibiotics Other (See Comments)  Makes sick   Prior to Admission medications   Medication Sig Start Date End Date Taking? Authorizing Provider  Naproxen Sodium (ALEVE PO) Take 4 tablets by mouth 2 (two) times daily as needed (pain).   Yes [provider]  pregabalin (LYRICA) 100 MG capsule Take 1 capsule (100 mg) by mouth twice daily and 2 capsules (200 mg total) nightly Patient taking differently: Take 100 mg by mouth 2 (two) times daily. Take 1 capsule (100 mg) by mouth twice daily and 2 capsules (200 mg total) nightly 12/06/21  Yes Atway, Rayann N, DO  acetaminophen (TYLENOL) 325 MG tablet Take 1-2 tablets (325-650 mg total) by mouth every 6 (six) hours as needed for mild  pain (pain score 1-3 or temp > 100.5). Patient not taking: Reported on 03/22/2022 03/22/21   Angiulli, Mcarthur Rossettianiel J, PA-C  ascorbic acid (VITAMIN C) 1000 MG tablet Take 1 tablet (1,000 mg total) by mouth daily. Patient not taking: Reported on 10/17/2021 03/22/21   Angiulli, Mcarthur Rossettianiel J, PA-C  citalopram (CELEXA) 40 MG tablet Take 1 tablet (40 mg total) by mouth at bedtime. Patient not taking: Reported on 03/22/2022 04/09/21   Jones Baleshomas, Eunice L, NP  cyanocobalamin 1000 MCG tablet Take 1/2 tablet (500 mcg total) by mouth daily. Patient not taking: Reported on 03/22/2022 03/22/21   Angiulli, Mcarthur Rossettianiel J, PA-C  dapagliflozin propanediol (FARXIGA) 10 MG TABS tablet Take 1 tablet (10 mg total) by mouth daily before breakfast. Patient not taking: Reported on 03/22/2022 02/25/22   Rana SnareZheng, Michael, DO  hydrOXYzine (ATARAX) 10 MG tablet Take 1 tablet (10 mg total) by mouth 3 (three) times daily as needed. Patient not taking: Reported on 03/22/2022 10/26/21   Rana SnareZheng, Michael, DO  Insulin Glargine-Lixisenatide (SOLIQUA) 100-33 UNT-MCG/ML SOPN Inject 30 Units into the skin daily. Patient not taking: Reported on 03/22/2022 02/25/22   Rana SnareZheng, Michael, DO  losartan (COZAAR) 25 MG tablet Take 1 tablet (25 mg total) by mouth at bedtime. Patient not taking: Reported on 03/22/2022 03/22/21 03/22/22  Angiulli, Mcarthur Rossettianiel J, PA-C  magnesium oxide (MAG-OX) 400 MG tablet Take 1 tablet (400 mg total) by mouth daily. Patient not taking: Reported on 03/22/2022 03/22/21   Charlton AmorAngiulli, Daniel J, PA-C  medroxyPROGESTERone Acetate (DEPO-PROVERA IM) Inject into the muscle every 3 (three) months. Patient not taking: Reported on 08/17/2021    [provider]  pantoprazole (PROTONIX) 40 MG tablet Take 1 tablet (40 mg total) by mouth daily. Patient not taking: Reported on 08/17/2021 03/22/21   Angiulli, Mcarthur Rossettianiel J, PA-C  pravastatin (PRAVACHOL) 40 MG tablet Take 1 tablet (40 mg total) by mouth daily. Patient not taking: Reported on 03/22/2022  03/22/21 10/17/21  Charlton AmorAngiulli, Daniel J, PA-C   DG Foot Complete Right  Result Date: 03/22/2022 CLINICAL DATA:  Ulceration along the foot EXAM: RIGHT FOOT COMPLETE - 3+ VIEW COMPARISON:  12/23/2021 FINDINGS: Diffuse bony demineralization. Fragmentation and dislocation in the midfoot primarily at the Lisfranc joint, with extensive prior fragmentation of the cuboid, cuneiforms, and navicular. Although quite possibly from Charcot arthropathy, the appearance is progressive from 12/23/2021 and there is gas in the soft tissues plantar to the midfoot and also in the soft tissues dorsal to the metatarsals compatible with substantial soft tissue infection reason the possibility of osteomyelitis for a component of the midfoot destructive findings. Questionable lucency in the talar head medially, cannot exclude talar head involvement. Abnormal gas tracks in the lateral ankle compatible with soft tissue infection extending up in the lateral ankle region. Plantar calcaneal spur noted. IMPRESSION: 1.  Progressive fragmentation, destruction, and dislocation in the midfoot primarily at the Lisfranc joint, with extensive prior fragmentation of the cuboid, cuneiforms, and navicular. 2. Gas tracks in the soft tissues plantar to the midfoot and dorsal to the metatarsals compatible with substantial soft tissue infection, and raising the possibility of osteomyelitis as a component of midfoot destructive findings (as opposed to Charcot arthropathy). There is also soft tissue gas in the lateral ankle region. 3. Questionable lucency in the talar head medially, cannot exclude osteomyelitis. 4. Diffuse bony demineralization. Electronically Signed   By: Gaylyn Rong M.D.   On: 03/22/2022 13:30   DG Ankle Complete Right  Result Date: 03/22/2022 CLINICAL DATA:  Fever.  Skin ulceration. EXAM: RIGHT ANKLE - COMPLETE 3+ VIEW COMPARISON:  01/06/2022 FINDINGS: Pronounced regional soft tissue swelling. Air/gas bubbles within the soft  tissues lateral to the ankle region. Chronic neuropathic changes of the midfoot. Osteomyelitis not excluded in that region. IMPRESSION: Pronounced regional soft tissue swelling. Air/gas bubbles within the soft tissues lateral to the ankle region. Chronic neuropathic changes of the midfoot. Osteomyelitis not excluded in that region. Electronically Signed   By: Paulina Fusi M.D.   On: 03/22/2022 13:24   - pertinent xrays, CT, MRI studies were reviewed and independently interpreted  Positive ROS: All other systems have been reviewed and were otherwise negative with the exception of those mentioned in the HPI and as above.  Physical Exam: General: Alert, no acute distress Psychiatric: Patient is competent for consent with normal mood and affect Lymphatic: No axillary or cervical lymphadenopathy Cardiovascular: No pedal edema Respiratory: No cyanosis, no use of accessory musculature GI: No organomegaly, abdomen is soft and non-tender    Images:  @ENCIMAGES @  Labs:  Lab Results  Component Value Date   HGBA1C 12.2 (A) 02/25/2022   HGBA1C 10.1 (H) 10/18/2021   ESRSEDRATE 15 03/22/2021   ESRSEDRATE 25 (H) 03/15/2021   ESRSEDRATE 67 (H) 02/26/2021   CRP 0.7 03/22/2021   CRP 0.9 03/15/2021   CRP 7.5 (H) 02/26/2021   REPTSTATUS 10/21/2021 FINAL 10/16/2021   CULT  10/16/2021    NO GROWTH 5 DAYS Performed at [facility], [address] [place], [place]    [facility] STAPHYLOCOCCUS AUREUS 02/16/2021    Lab Results  Component Value Date   ALBUMIN 2.1 (L) 03/22/2022   ALBUMIN 3.4 (L) 10/16/2021   ALBUMIN 2.7 (L) 03/22/2021        Latest Ref Rng & Units 03/23/2022    2:30 AM 03/22/2022    2:54 PM 03/22/2022    2:01 PM  CBC EXTENDED  WBC 4.0 - 10.5 K/uL 7.4     RBC 3.87 - 5.11 MIL/uL 3.42     Hemoglobin 12.0 - 15.0 g/dL 8.9  14/03/2022  60.4   HCT 54.0 - 46.0 % 25.8  31.0  32.0   Platelets 150 - 400 K/uL 267       Neurologic: Patient does not have protective  sensation bilateral lower extremities.   MUSCULOSKELETAL:   Skin: Patient has cellulitis induration right foot  Patient is alert, oriented, no adenopathy, well-dressed, normal affect, normal respiratory effort. Examination patient has increased redness, and  swelling of the right foot and ankle.  She has cellulitis proximal to the ankle.  Previously she had a flat Wagner grade 1 ulcer on the plantar aspect of her foot now she has a large deep tunneling ulcer that extends down to bone and has necrotic soft tissue.  Laterally she has a large  ischemic ulcer over the lateral malleolus.  Using the Doppler she has a strong multiphasic dorsalis pedis pulse with no evidence of acute arterial insufficiency.  Hemoglobin 8.9.  Albumin 2.1.  Patient has persistent nausea and vomiting despite aggressive medical management.    Assessment: Assessment: Abscess osteomyelitis ulceration right.  Plan: Will plan for a right below-knee amputation this afternoon.  Risk and benefits were discussed including risk of the wound not healing need for additional surgery.  Thank you for the consult and the opportunity to see Ms. Thornell Sartorius, MD [facility] [phone] 7:06 AM

## 2022-03-23 NOTE — Transfer of Care (Signed)
Immediate Anesthesia Transfer of Care Note  Patient: Lori Schmidt  Procedure(s) Performed: RIGHT BELOW KNEE AMPUTATION (Right: Knee)  Patient Location: PACU  Anesthesia Type:MAC combined with regional for post-op pain  Level of Consciousness: awake, alert , and oriented  Airway & Oxygen Therapy: Patient Spontanous Breathing  Post-op Assessment: Report given to RN and Post -op Vital signs reviewed and stable  Post vital signs: Reviewed and stable  Last Vitals:  Vitals Value Taken Time  BP 115/65   Temp 98   Pulse 104 03/23/22 1350  Resp 19 03/23/22 1350  SpO2 93 % 03/23/22 1350  Vitals shown include unvalidated device data.  Last Pain:  Vitals:   03/23/22 1126  TempSrc: Oral  PainSc:          Complications: No notable events documented.

## 2022-03-23 NOTE — Anesthesia Procedure Notes (Signed)
Anesthesia Regional Block: Adductor canal block   Pre-Anesthetic Checklist: , timeout performed,  Correct Patient, Correct Site, Correct Laterality,  Correct Procedure, Correct Position, site marked,  Risks and benefits discussed,  Surgical consent,  Pre-op evaluation,  At surgeon's request and post-op pain management  Laterality: Right  Prep: chloraprep       Needles:  Injection technique: Single-shot  Needle Type: Echogenic Stimulator Needle     Needle Length: 10cm  Needle Gauge: 20     Additional Needles:   Procedures:,,,, ultrasound used (permanent image in chart),,    Narrative:  Start time: 03/23/2022 12:25 PM End time: 03/23/2022 12:28 PM Injection made incrementally with aspirations every 5 mL.  Performed by: Personally  Anesthesiologist: Lucretia Kern, MD  Additional Notes: Standard monitors applied. Skin prepped. Good needle visualization with ultrasound. Injection made in 5cc increments with no resistance to injection. Patient tolerated the procedure well.

## 2022-03-23 NOTE — Progress Notes (Signed)
Insulin drip was stopped at 12:12, and D5 LR was started at 171ml/hr per order.

## 2022-03-23 NOTE — Progress Notes (Addendum)
HD#1 SUBJECTIVE:  Patient Summary: Lori Schmidt is a 46 y.o. with pertinent PMH of diabetes with peripheral neuropathy, Charcot foot, prior osteomyelitis and discitis who presented with foot pain/wound and admitted for osteomyelitis   Overnight Events:  NAEO    Interm History:  Reports feeling poor. Complains of nausea overnight with consistent bilious vomiting. Abd tender. Leg erythema seems to be improving. Shares that she is feeling down due to needing 2nd amputation.  OBJECTIVE:  Vital Signs: Vitals:   03/23/22 1236 03/23/22 1241 03/23/22 1246 03/23/22 1251  BP: 105/67 100/66 103/63 103/70  Pulse: (!) 114 (!) 110 (!) 111 (!) 117  Resp: 20 18 (!) 22 (!) 25  Temp:      TempSrc:      SpO2: 93% 94% 95% 95%  Weight:      Height:       Supplemental O2:  SpO2: 95 % O2 Flow Rate (L/min): 2 L/min  Filed Weights   03/22/22 1207  Weight: 117.9 kg     Intake/Output Summary (Last 24 hours) at 03/23/2022 1320 Last data filed at 03/23/2022 1037 Gross per 24 hour  Intake 2192.13 ml  Output --  Net 2192.13 ml   Net IO Since Admission: 2,192.13 mL [03/23/22 1320]  Physical Exam: Constitutional: ill-appearing female sitting in bed, in no acute distress HENT: normocephalic atraumatic, dry mm, erythematous tongue Neck: supple Cardiovascular: tachycardic rate, regular rhythm, no m/r/g Pulmonary/Chest: normal work of breathing on room air, lungs clear to auscultation bilaterally Abdominal: soft, non-tender, non-distended MSK: Surgically absent left lower extremity BKA, right lower extremity with edema erythema, Charcot foot deformity, plantar surface ulceration, wound malleolar eschar Neurological: alert & oriented x 3, 5/5 strength in bilateral upper and lower extremities, normal gait Skin: warm and dry, LLE erythema to mid- shin Psych: Mood and affect appropriate   ASSESSMENT/PLAN:   Lori Schmidt is a 46 y.o. with pertinent PMH of diabetes with  peripheral neuropathy, Charcot foot, prior osteomyelitis and discitis who presented with foot pain/wound and admitted for osteomyelitis on hospital day 0   # Osteomyelitis # Necrotizing fasciitis # Charcot foot Patient is planned for transtibial amputation today with orthopedics.  IV antibiotics can be stopped after source control has been obtained, blood cultures are in process. still has evidence of systemic inflammation.  - IV antibiotics Zosyn and linezolid - Blood cultures collected will follow-up - Pain control; tylenol, PO oxycodone, IV dilaudid - ortho following, plan for or later today  - Plan for PT OT consult once she has had surgery. - Heparin for DVT prophylaxis   # Suspected early DKA # Uncontrolled diabetes type 2 # Diabetic neuropathy  Gap closed overnight. Patient has been transitioned off of Endo tool.   CBGs remained in the 200s, seems to have significant degree of insulin resistance.  Takes glargine 30 units daily at home, we will restart 15 since she is n.p.o. for surgery today. - stop endotool - Will start on sliding scale insulin after surgery. - Plan to restart glargine 30 units daily once taking oral again.  Intractable nausea and vomiting Likely related to systemic inflammation from infected limb.  Zofran, reglan, compazine did not control patient's symptoms overnight. Has continued to vomit. K low this morning, likely related to IV insulin, possible Gi loss component. - replace K - droperidol    # Chronic problems # HLD # Depression   Diet: NPO VTE: Heparin IVF: none Code: Full Prior to Admission Living Arrangement: Home, living with  family Anticipated Discharge Location: Home Barriers to Discharge: Medical stability Dispo: Admit patient to Inpatient with expected length of stay greater than 2 midnights.  Signature: Adron Bene, MD

## 2022-03-23 NOTE — Anesthesia Postprocedure Evaluation (Signed)
Anesthesia Post Note  Patient: Lori Schmidt  Procedure(s) Performed: RIGHT BELOW KNEE AMPUTATION (Right: Knee)     Patient location during evaluation: PACU Anesthesia Type: Regional Level of consciousness: awake and alert Pain management: pain level controlled Vital Signs Assessment: post-procedure vital signs reviewed and stable Respiratory status: spontaneous breathing, nonlabored ventilation and respiratory function stable Cardiovascular status: blood pressure returned to baseline and stable Postop Assessment: no apparent nausea or vomiting Anesthetic complications: no   No notable events documented.  Last Vitals:  Vitals:   03/23/22 1445 03/23/22 1500  BP: (!) 96/59 107/64  Pulse: 99 98  Resp: 17 18  Temp:    SpO2: 93% 96%    Last Pain:  Vitals:   03/23/22 1430  TempSrc:   PainSc: Asleep                 Lucretia Kern

## 2022-03-23 NOTE — Op Note (Signed)
03/23/2022  2:04 PM  PATIENT:  Lori Schmidt    PRE-OPERATIVE DIAGNOSIS:  Abscess Osteomyelitis Right Foot  POST-OPERATIVE DIAGNOSIS:  Same  PROCEDURE:  RIGHT BELOW KNEE AMPUTATION Application of Kerecis micro graft 38 cm and Kerecis sheet 7 x 10 cm. Application of Prevena customizable and Prevena arthroform wound VAC dressings Application of Vive Wear stump shrinker and the Hanger limb protector  SURGEON:  Nadara Mustard, MD  ANESTHESIA:   General  PREOPERATIVE INDICATIONS:  Lori Schmidt is a  46 y.o. female with a diagnosis of Abscess Osteomyelitis Right Foot who failed conservative measures and elected for surgical management.    The risks benefits and alternatives were discussed with the patient preoperatively including but not limited to the risks of infection, bleeding, nerve injury, cardiopulmonary complications, the need for revision surgery, among others, and the patient was willing to proceed.  OPERATIVE IMPLANTS: Kerecis micro graft 38 cm and Kerecis sheet 7 x 10 cm.   OPERATIVE FINDINGS: tissue margins clear  OPERATIVE PROCEDURE: Patient was brought to the operating room after undergoing a regional anesthetic.  After adequate levels anesthesia were obtained a thigh tourniquet was placed and the lower extremity was prepped using DuraPrep draped into a sterile field. The foot was draped out of the sterile field with impervious stockinette.  A timeout was called and the tourniquet inflated.  A transverse skin incision was made 12 cm distal to the tibial tubercle, the incision curved proximally, and a large posterior flap was created.  The tibia was transected just proximal to the skin incision and beveled anteriorly.  The fibula was transected just proximal to the tibial incision.  The sciatic nerve was pulled cut and allowed to retract.  The vascular bundles were suture ligated with 2-0 silk.  The tourniquet was deflated and hemostasis obtained.    Drill  holes were placed through the tibia and fibula to secure the [facility] tissue graft and the gastrocnemius fascia.    The Kerecis micro powder 38 cm was applied to the open wound that has a 200 cm surface area.  The 7 x 10 cm Kerecis sheet was then folded and secured to the distal tibia and fibula with #1 Vicryl.  A separate drill hole was then used to secure the [facility] tissue graft and the gastrocnemius fascia to the dorsum of the tibia.    The deep and superficial fascial layers were closed using #1 Vicryl.  The skin was closed using staples.    The Prevena customizable dressing was applied this was overwrapped with the arthroform sponge.  Collier Flowers was used to secure the sponges and the circumferential compression was secured to the skin with Dermatac.  This was connected to the wound VAC pump and had a good suction fit this was covered with a stump shrinker and a limb protector.  Patient was taken to the PACU in stable condition.   DISCHARGE PLANNING:  Antibiotic duration: 24-hour antibiotics  Weightbearing: Nonweightbearing on the operative extremity  Pain medication: Opioid pathway  Dressing care/ Wound VAC: Continue wound VAC with the Prevena plus pump at discharge for 1 week  Ambulatory devices: Walker or kneeling scooter  Discharge to: Discharge planning based on recommendations per physical therapy  Follow-up: In the office 1 week after discharge.

## 2022-03-23 NOTE — ED Notes (Addendum)
Report called to OR. Sending for patient.

## 2022-03-23 NOTE — Progress Notes (Signed)
Inpatient Diabetes Program Recommendations  AACE/ADA: New Consensus Statement on Inpatient Glycemic Control (2015)  Target Ranges:  Prepandial:   less than 140 mg/dL      Peak postprandial:   less than 180 mg/dL (1-2 hours)      Critically ill patients:  140 - 180 mg/dL   Lab Results  Component Value Date   GLUCAP 180 (H) 03/23/2022   HGBA1C 12.2 (A) 02/25/2022    Review of Glycemic Control  Latest Reference Range & Units 03/23/22 06:23 03/23/22 07:28 03/23/22 08:31 03/23/22 09:24 03/23/22 10:35 03/23/22 11:33  Glucose-Capillary 70 - 99 mg/dL 373 (H) 428 (H) 768 (H) 169 (H) 183 (H) 180 (H)   Diabetes history: DM 2 Outpatient Diabetes medications:  Soliqua, Farxiga- started on 11/17 however per medication rec. Patient was unable to afford Current orders for Inpatient glycemic control:  IV insulin (drip rates are >15 units/hr)  Inpatient Diabetes Program Recommendations:     Recommend continuation of IV insulin until drip rates are less than at least 8 units/hr.   Patient is currently in surgery.  Will need to make sure that she has plan for purchase of medications at discharge for diabetes.  Will have DM coord. F/u on 12/14.    Thanks,  Beryl Meager, RN, BC-ADM Inpatient Diabetes Coordinator Pager [phone]  (8a-5p)

## 2022-03-23 NOTE — Anesthesia Preprocedure Evaluation (Signed)
Anesthesia Evaluation  Patient identified by MRN, date of birth, ID band Patient awake    Reviewed: Allergy & Precautions, NPO status , Patient's Chart, lab work & pertinent test results  History of Anesthesia Complications Negative for: history of anesthetic complications  Airway Mallampati: II  TM Distance: >3 FB Neck ROM: Full    Dental   Pulmonary neg pulmonary ROS, former smoker   Pulmonary exam normal        Cardiovascular hypertension, Normal cardiovascular exam     Neuro/Psych   Anxiety Depression    negative neurological ROS     GI/Hepatic negative GI ROS, Neg liver ROS,,,  Endo/Other  diabetes, Poorly Controlled, Type 2, Insulin Dependent  Morbid obesity  Renal/GU negative Renal ROS  negative genitourinary   Musculoskeletal negative musculoskeletal ROS (+)    Abdominal   Peds  Hematology  (+) Blood dyscrasia, anemia   Anesthesia Other Findings Right foot osteomyelitis  Reproductive/Obstetrics                             Anesthesia Physical Anesthesia Plan  ASA: 3  Anesthesia Plan: MAC and Regional   Post-op Pain Management: Regional block* and Tylenol PO (pre-op)*   Induction: Intravenous  PONV Risk Score and Plan: 2 and Propofol infusion, TIVA and Treatment may vary due to age or medical condition  Airway Management Planned: Natural Airway, Nasal Cannula and Simple Face Mask  Additional Equipment: None  Intra-op Plan:   Post-operative Plan:   Informed Consent: I have reviewed the patients History and Physical, chart, labs and discussed the procedure including the risks, benefits and alternatives for the proposed anesthesia with the patient or authorized representative who has indicated his/her understanding and acceptance.       Plan Discussed with:   Anesthesia Plan Comments:         Anesthesia Quick Evaluation

## 2022-03-23 NOTE — Plan of Care (Signed)

## 2022-03-23 NOTE — Progress Notes (Signed)
Pt CBG 212. Jaynie Collins MD notified regarding pt CBG. RN advised to follow pt sliding scale insulin.

## 2022-03-24 ENCOUNTER — Encounter (HOSPITAL_COMMUNITY): Payer: Self-pay | Admitting: Orthopedic Surgery

## 2022-03-24 DIAGNOSIS — E1169 Type 2 diabetes mellitus with other specified complication: Secondary | ICD-10-CM

## 2022-03-24 DIAGNOSIS — Z89511 Acquired absence of right leg below knee: Secondary | ICD-10-CM

## 2022-03-24 DIAGNOSIS — Z6841 Body Mass Index (BMI) 40.0 and over, adult: Secondary | ICD-10-CM

## 2022-03-24 DIAGNOSIS — Z794 Long term (current) use of insulin: Secondary | ICD-10-CM

## 2022-03-24 LAB — GLUCOSE, CAPILLARY
Glucose-Capillary: 188 mg/dL — ABNORMAL HIGH (ref 70–99)
Glucose-Capillary: 195 mg/dL — ABNORMAL HIGH (ref 70–99)
Glucose-Capillary: 205 mg/dL — ABNORMAL HIGH (ref 70–99)
Glucose-Capillary: 236 mg/dL — ABNORMAL HIGH (ref 70–99)
Glucose-Capillary: 258 mg/dL — ABNORMAL HIGH (ref 70–99)
Glucose-Capillary: 263 mg/dL — ABNORMAL HIGH (ref 70–99)
Glucose-Capillary: 286 mg/dL — ABNORMAL HIGH (ref 70–99)

## 2022-03-24 LAB — BASIC METABOLIC PANEL
Anion gap: 9 (ref 5–15)
BUN: 11 mg/dL (ref 6–20)
CO2: 25 mmol/L (ref 22–32)
Calcium: 7.6 mg/dL — ABNORMAL LOW (ref 8.9–10.3)
Chloride: 104 mmol/L (ref 98–111)
Creatinine, Ser: 0.53 mg/dL (ref 0.44–1.00)
GFR, Estimated: >60 mL/min (ref >60)
Glucose, Bld: 215 mg/dL — ABNORMAL HIGH (ref 70–99)
Potassium: 3.6 mmol/L (ref 3.5–5.1)
Sodium: 138 mmol/L (ref 135–145)

## 2022-03-24 LAB — IRON AND TIBC
Iron: 17 ug/dL — ABNORMAL LOW (ref 28–170)
Saturation Ratios: 9 % — ABNORMAL LOW (ref 10.4–31.8)
TIBC: 190 ug/dL — ABNORMAL LOW (ref 250–450)
UIBC: 173 ug/dL

## 2022-03-24 LAB — CBC
HCT: 26 % — ABNORMAL LOW (ref 36.0–46.0)
Hemoglobin: 8.5 g/dL — ABNORMAL LOW (ref 12.0–15.0)
MCH: 25.4 pg — ABNORMAL LOW (ref 26.0–34.0)
MCHC: 32.7 g/dL (ref 30.0–36.0)
MCV: 77.6 fL — ABNORMAL LOW (ref 80.0–100.0)
Platelets: 249 10*3/uL (ref 150–400)
RBC: 3.35 MIL/uL — ABNORMAL LOW (ref 3.87–5.11)
RDW: 14.5 % (ref 11.5–15.5)
WBC: 4.3 10*3/uL (ref 4.0–10.5)
nRBC: 0 % (ref 0.0–0.2)

## 2022-03-24 LAB — MAGNESIUM: Magnesium: 1.7 mg/dL (ref 1.7–2.4)

## 2022-03-24 MED ORDER — INSULIN ASPART 100 UNIT/ML IJ SOLN
0.0000 [IU] | Freq: Three times a day (TID) | INTRAMUSCULAR | Status: DC
  Administered 2022-03-24: 4 [IU] via SUBCUTANEOUS
  Administered 2022-03-25: 7 [IU] via SUBCUTANEOUS
  Administered 2022-03-25: 4 [IU] via SUBCUTANEOUS

## 2022-03-24 MED ORDER — PREGABALIN 100 MG PO CAPS
100.0000 mg | ORAL_CAPSULE | Freq: Two times a day (BID) | ORAL | Status: DC
  Administered 2022-03-24 – 2022-03-25 (×3): 100 mg via ORAL
  Filled 2022-03-24 (×3): qty 1

## 2022-03-24 MED ORDER — INSULIN GLARGINE-YFGN 100 UNIT/ML ~~LOC~~ SOLN
30.0000 [IU] | Freq: Every day | SUBCUTANEOUS | Status: DC
  Administered 2022-03-24 – 2022-03-25 (×2): 30 [IU] via SUBCUTANEOUS
  Filled 2022-03-24 (×2): qty 0.3

## 2022-03-24 NOTE — Evaluation (Signed)
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 Pt is alert and oriented x4, good safety awareness/awareness of current level of functioning. Pt is tearful regarding current status. PTA pt was MOD I in ADL/IADL with [name] in November 2022. Pt presents with deficits in strength, endurance, activity tolerance, balance all affecting safe and optimal ADL completion. Discussion re: discharge plan, pt wants to return home, agreeable to mwc level (pt does report mwc is bulky). Educated re: need for safe lateral transfers to mwc and bsc for potential safe dc home. Pt would be an appropriate candidate for intensive rehab given new BKA and independence level prior to admission. Pt hopes to dc home with HHOT at mwc level and assist from children (71 year old, 80 year old). OT will continue to follow acutely.      Recommendations for follow up therapy are one component of a multi-disciplinary discharge planning process, led by the attending physician.  Recommendations may be updated based on patient status, additional functional criteria and insurance authorization.   Follow Up Recommendations  Home Health OT     Assistance Recommended at Discharge Intermittent Supervision/Assistance  Patient can return home with the following A little help with bathing/dressing/bathroom;A lot of help with walking and/or transfers    Functional Status Assessment  Patient has had a recent decline in their functional status and demonstrates the ability to make significant improvements in function in a reasonable and predictable amount of time.  Equipment Recommendations   Other (comment) (light weight manual wheelchair or assessment for pwc (assessed for by [facility] home/outpatient setting))    Recommendations for Other Services       Precautions / Restrictions Precautions Precautions: Fall Precaution Comments: wound vac Required Braces or Orthoses: Other Brace Other Brace: limb protector right LE and left prosthesis Restrictions Weight Bearing Restrictions: Yes      Mobility Bed Mobility               General bed mobility comments: NT in recliner pre/post sessoin    Transfers Overall transfer level: Needs assistance Equipment used: Rolling walker (2 wheels) Transfers: Sit to/from Stand Sit to Stand: Max assist           General transfer comment: pt unable to achieve full standing, difficulties with L prosthetic; discussed wc level transfers if pt wants to dc home      Balance Overall balance assessment: Needs assistance Sitting-balance support: Feet supported Sitting balance-Leahy Scale: Good       Standing balance-Leahy Scale: Zero Standing balance comment: unable to achieve standing with RW                           ADL either performed or assessed with clinical judgement   ADL Overall ADL's : Needs assistance/impaired Eating/Feeding: Set up;Sitting   Grooming: Wash/dry hands;Wash/dry face;Sitting;Set up           Upper Body Dressing : Set up;Sitting   Lower Body Dressing: Supervision/safety;Set up Lower Body Dressing Details (indicate cue type and reason): donn L prosthetic, sock- difficulties with snapping into prosthetic as she typically stood to do so- reports she will be able  to do it from high bed at home                     Vision Patient Visual Report: No change from baseline       Perception     Praxis      Pertinent Vitals/Pain Pain Assessment Pain Assessment: 0-10 Pain Score: 6  Pain Location: right BKA Pain Descriptors / Indicators: Aching, Grimacing Pain Intervention(s):  Limited activity within patient's tolerance, Monitored during session, Premedicated before session, Repositioned     Hand Dominance Right   Extremity/Trunk Assessment Upper Extremity Assessment Upper Extremity Assessment: Defer to OT evaluation   Lower Extremity Assessment Lower Extremity Assessment: RLE deficits/detail;LLE deficits/detail RLE Deficits / Details: new BKA, able to perform SLR LLE Deficits / Details: old BKA   Cervical / Trunk Assessment Cervical / Trunk Assessment: Normal   Communication Communication Communication: No difficulties   Cognition Arousal/Alertness: Awake/alert Behavior During Therapy: WFL for tasks assessed/performed Overall Cognitive Status: Within Functional Limits for tasks assessed                                 General Comments: tearful regarding current level of functioning     General Comments  HR incr to 130 bpm with transfer    Exercises Other Exercises Other Exercises: edu re: role of OT, role of rehab, discharge recommendations, home safety, falls prevention   Shoulder Instructions      Home Living Family/patient expects to be discharged to:: Private residence Living Arrangements: Children (21 year old, 66 year old) Available Help at Discharge: Family;Available PRN/intermittently Type of Home: House Home Access: Ramped entrance     Home Layout: One level     Bathroom Shower/Tub: Arts development officer Toilet: Handicapped height     Home Equipment: Agricultural consultant (2 wheels);Tub bench;Cane - single point;Wheelchair - manual;Hand held shower head;Grab bars - tub/shower;Adaptive equipment;Other (comment) (drop arm bsc) Adaptive Equipment: Reacher;Long-handled sponge        Prior Functioning/Environment               Mobility Comments: amb with no AD with use of prosthetic, MOD I to mwc ADLs Comments: MOD I in ADL/IADL, 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         OT Problem List: Decreased strength;Decreased activity tolerance;Impaired balance (sitting and/or standing);Decreased safety awareness;Decreased knowledge of precautions;Decreased knowledge of use of DME or AE      OT Treatment/Interventions: Self-care/ADL training;Patient/family education;Therapeutic exercise;Balance training;Energy conservation;Therapeutic activities;DME and/or AE instruction    OT Goals(Current goals can be found in the care plan section) Acute Rehab OT Goals Patient Stated Goal: go home OT Goal Formulation: With patient Time For Goal Achievement: 04/07/22 Potential to Achieve Goals: Good ADL Goals Pt Will Perform Grooming: with modified independence;sitting Pt Will Perform Lower Body Dressing: with modified independence Pt Will Transfer to Toilet: with modified independence Pt Will Perform Toileting - Clothing Manipulation and hygiene: with modified independence;sitting/lateral leans Pt/caregiver will Perform Home Exercise Program: Increased strength;Both right and left upper extremity;With written HEP provided;With theraband  OT Frequency: Min 2X/week    Co-evaluation              AM-PAC OT "6 Clicks" Daily Activity     Outcome Measure Help from another person eating meals?: None Help from another person taking care of personal grooming?: None Help from another  person toileting, which includes using toliet, bedpan, or urinal?: A Lot Help from another person bathing (including washing, rinsing, drying)?: A Lot Help from another person to put on and taking off regular upper body clothing?: None Help from another person to put on and taking off regular lower body clothing?: A Little 6 Click Score: 19   End of Session Nurse Communication: Mobility status  Activity Tolerance: Patient tolerated treatment well Patient left: in chair;with call bell/phone within reach  OT Visit Diagnosis: Other abnormalities of gait and mobility (R26.89)                 Time: 7416-3845 OT Time Calculation (min): 20 min Charges:  OT General Charges $OT Visit: 1 Visit OT Evaluation $OT Eval Moderate Complexity: 1 Mod  Oleta Mouse, OTD OTR/L  03/24/22, 2:04 PM

## 2022-03-24 NOTE — Progress Notes (Signed)
Patient ID: Lori Schmidt, female   DOB: [birth year], 46 y.o.   MRN: [id number] Patient is postoperative day 1 right below-knee amputation.  Patient states that her nausea and vomiting that she had for 5 days continuously prior to surgery has resolved.  Patient states she feels sore.  There is no drainage in the wound VAC canister there is a good suction fit.  Patient states that she currently does not have insurance but will have [employer] care in several months.  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  Patient to work with therapy and will see if we can discharge to home with home therapy.

## 2022-03-24 NOTE — Progress Notes (Signed)
   03/24/22 0436  Assess: MEWS Score  Temp 98.3 F (36.8 C)  BP 127/83  MAP (mmHg) 97  Pulse Rate (!) 117  ECG Heart Rate (!) 118  Resp 14  Level of Consciousness Alert  SpO2 94 %  O2 Device Room Air  Patient Activity (if Appropriate) In bed  Assess: MEWS Score  MEWS Temp 0  MEWS Systolic 0  MEWS Pulse 2  MEWS RR 0  MEWS LOC 0  MEWS Score 2  MEWS Score Color Yellow  Assess: if the MEWS score is Yellow or Red  Were vital signs taken at a resting state? Yes  Focused Assessment No change from prior assessment  Does the patient meet 2 or more of the SIRS criteria? No  MEWS guidelines implemented *See Row Information* Yes  Treat  Pain Intervention(s) Medication (See eMAR)  Take Vital Signs  Increase Vital Sign Frequency  Yellow: Q 2hr X 2 then Q 4hr X 2, if remains yellow, continue Q 4hrs  Escalate  MEWS: Escalate Yellow: discuss with charge nurse/RN and consider discussing with provider and RRT  Notify: Charge Nurse/RN  Name of Charge Nurse/RN Notified Matt Naar  Date Charge Nurse/RN Notified 03/24/22  Time Charge Nurse/RN Notified 0437  Assess: SIRS CRITERIA  SIRS Temperature  0  SIRS Pulse 1  SIRS Respirations  0  SIRS WBC 0  SIRS Score Sum  1

## 2022-03-24 NOTE — TOC Initial Note (Signed)
Transition of Care [facility]) - Initial/Assessment Note    Patient Details  Name: Lori Schmidt MRN: [id number] Date of Birth: 06/09/1975  Transition of Care [facility]) CM/SW Contact:    Harriet Masson, RN Phone Number: 03/24/2022, 1:20 PM  Clinical Narrative:                 Spoke to patient regarding transition needs.  Patient states she lives with her adult son and 38yr old daughter.  Son can help transport patient to apts.  Patient has singed up for insurance that will be active 04/11/22. Patient will need MATCH letter at discharge for discharge prescriptions.  Emailed FC in regards to [employer] and disability applications. TOC will follow for therapy recommendations.   Barriers to Discharge: Continued Medical Work up   Patient Goals and CMS Choice Patient states their goals for this hospitalization and ongoing recovery are:: return home      Expected Discharge Plan and Services   In-house Referral: Financial Counselor     Living arrangements for the past 2 months: Single Family Home                                      Prior Living Arrangements/Services Living arrangements for the past 2 months: Single Family Home Lives with:: Adult Children, Minor Children Patient language and need for interpreter reviewed:: Yes Do you feel safe going back to the place where you live?: Yes      Need for Family Participation in Patient Care: Yes (Comment) Care giver support system in place?: Yes (comment) Current home services: DME Criminal Activity/Legal Involvement Pertinent to Current Situation/Hospitalization: No - Comment as needed  Activities of Daily Living      Permission Sought/Granted                  Emotional Assessment   Attitude/Demeanor/Rapport: Engaged, Gracious Affect (typically observed): Accepting Orientation: : Oriented to Situation, Oriented to  Time, Oriented to Place, Oriented to Self   Psych Involvement: No (comment)  Admission  diagnosis:  Osteomyelitis (HCC) [M86.9] Hyperglycemia [R73.9] Cellulitis, unspecified cellulitis site [L03.90] Patient Active Problem List   Diagnosis Date Noted   Severe protein-calorie malnutrition (HCC) 03/23/2022   DKA, type 2 (HCC) 03/23/2022   Subacute osteomyelitis, right ankle and foot (HCC) 03/22/2022   Cellulitis 03/22/2022   Health care maintenance 02/25/2022   Venous insufficiency of right leg 12/01/2021   Leg edema, right 11/17/2021   Encounter for birth control 11/10/2021   Anxiety 10/26/2021   Abscess of skin and subcutaneous tissue 10/16/2021   Dyslipidemia    Hypoalbuminemia due to protein-calorie malnutrition (HCC)    Diabetic peripheral neuropathy (HCC)    Acute blood loss anemia    S/P BKA (below knee amputation) unilateral, left (HCC) 03/05/2021   Left below-knee amputee (HCC) 03/05/2021   Adjustment disorder with mixed anxiety and depressed mood    Pressure injury of skin 02/28/2021   Discitis of lumbar region    Diabetes mellitus (HCC)    Left leg cellulitis    Morbid obesity with BMI of 40.0-44.9, adult (HCC) 01/05/2012   PCP:  Rana Snare, DO Pharmacy:   [facility] [id number] - , [place] - [address] [place] [address] Phone: [phone] Fax: [phone]  [facility] [address] [place] [address] Phone: [phone] Fax: [phone]     Social  Determinants of Health (SDOH) Interventions    Readmission Risk Interventions     No data to display

## 2022-03-24 NOTE — Plan of Care (Signed)

## 2022-03-24 NOTE — Progress Notes (Addendum)
    HD#2 SUBJECTIVE:  Patient Summary: Makayla Lanter is a 46 y.o. with pertinent PMH of diabetes with peripheral neuropathy, Charcot foot, prior osteomyelitis and discitis who presented with foot pain/wound and admitted for osteomyelitis   Overnight Events:  NAEO    Interm History:  Feels improved today. No longer vomiting. Has been eating without trouble. Having leg pain, but reluctant to take pain medications.   OBJECTIVE:  Vital Signs: Vitals:   03/23/22 2330 03/24/22 0317 03/24/22 0425 03/24/22 0436  BP: 114/76 109/67  127/83  Pulse: (!) 105 (!) 112  (!) 117  Resp: 16 15 16 14   Temp: 98.5 F (36.9 C) 98.3 F (36.8 C)  98.3 F (36.8 C)  TempSrc: Oral Oral    SpO2: 95% 92%  94%  Weight:      Height:       Supplemental O2:  SpO2: 94 % O2 Flow Rate (L/min): 2 L/min  Filed Weights   03/22/22 1207  Weight: 117.9 kg   Physical Exam: Constitutional: sitting in chair, in no acute distress HENT: normocephalic atraumatic Neck: supple Cardiovascular: tachycardic rate, regular rhythm, no m/r/g Pulmonary/Chest: normal work of breathing on room air, lungs clear to auscultation bilaterally Abdominal: soft, non-tender, non-distended MSK: Surgically absent left lower extremity BKA, surgically absent right RLE Neurological: alert & oriented Skin: warm and dry Psych: Mood and affect appropriate   ASSESSMENT/PLAN:   Marolyn Urschel is a 46 y.o. with pertinent PMH of diabetes with peripheral neuropathy, Charcot foot, prior osteomyelitis and discitis who presented with foot pain/wound and admitted for osteomyelitis on hospital day 0   # Osteomyelitis right foot # Necrotizing fasciitis # Charcot foot Status post amputation yesterday with clean margins and good source control.  Systemic symptoms have largely resolved.  She does still have some tachycardia though this is likely related to cardiac autonomic neuropathy.  We will plan to discontinue antibiotics  today. No PT follow-up, OT recommended home health. - Stop antibiotics later today - Blood cultures collected will follow-up - Pain control per Ortho - resume home lyrica - Heparin for DVT prophylaxis   # Suspected early DKA # Uncontrolled diabetes type 2 with hyperglycemia # Diabetic neuropathy Mild hyperglycemia, only received half dose despite resumption of diet.  We will increase long-acting insulin to baseline 30 units. - sliding scale insulin after surgery. - Continue glargine 30 units  Intractable nausea and vomiting Resolved   # Chronic problems # HLD # Depression   Diet: NPO VTE: Heparin IVF: none Code: Full Prior to Admission Living Arrangement: Home, living with family Anticipated Discharge Location: Home Barriers to Discharge: Medical stability Dispo: Admit patient to Inpatient with expected length of stay greater than 2 midnights.  Signature: 49, MD

## 2022-03-24 NOTE — Evaluation (Signed)
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 Pt with good bed mobility and sitting balance. Pt did need assist with transfer but should progress and be able to go home with family support.  Pt currently with functional limitations due to the deficits listed below (see PT Problem List). Pt will benefit from skilled PT to increase their independence and safety with mobility to allow discharge to the venue listed below.          Recommendations for follow up therapy are one component of a multi-disciplinary discharge planning process, led by the attending physician.  Recommendations may be updated based on patient status, additional functional criteria and insurance authorization.  Follow Up Recommendations No PT follow up (Does not have coverage and has 24 hour support per pt)      Assistance Recommended at Discharge Frequent or constant Supervision/Assistance  Patient can return home with the following  A little help with walking and/or transfers;A little help with bathing/dressing/bathroom;Assistance with cooking/housework;Assist for transportation;Help with stairs or ramp for entrance    Equipment Recommendations None recommended by PT  Recommendations for Other Services       Functional Status Assessment Patient has had a recent decline in their functional status and demonstrates the ability to make significant improvements in function in a reasonable and predictable amount of time.     Precautions / Restrictions Precautions Precautions: Fall Precaution Comments: wound vac Required Braces or Orthoses: Other Brace Other Brace: limb protector right LE and left  prosthesis Restrictions Weight Bearing Restrictions: Yes      Mobility  Bed Mobility Overal bed mobility: Needs Assistance Bed Mobility: Supine to Sit     Supine to sit: Independent          Transfers Overall transfer level: Needs assistance             Anterior-Posterior transfers: +2 physical assistance, From elevated surface, Mod assist, Min assist  Lateral/Scoot Transfers: Total assist, +2 physical assistance, From elevated surface General transfer comment: Pt was unable to lateral scoot to drop arm recliner. Pt felt "stuck " to bed. Pt also expresssed anxiety. Moved chair and pt was able to complete posterior transfer to chair with min to mod assist with use of UEs. Pt got into chair with min assist but needing mod assist to get scooted all the way back.    Ambulation/Gait                  Stairs            Wheelchair Mobility    Modified Rankin (Stroke Patients Only)       Balance Overall balance assessment: Needs assistance Sitting-balance support: Feet supported, No upper extremity supported Sitting balance-Leahy Scale: Good                                       Pertinent Vitals/Pain Pain Assessment Pain Assessment: Faces Faces Pain Scale: Hurts whole lot Pain Location: right BKA Pain Descriptors / Indicators: Aching, Grimacing Pain Intervention(s): Limited activity within patient's tolerance, Monitored during session, Repositioned    Home Living Family/patient expects to be discharged to::  Private residence Living Arrangements: Children (51 year old, 30 year old) Available Help at Discharge: Family;Available PRN/intermittently Type of Home: House Home Access: Ramped entrance       Home Layout: One level Home Equipment: Agricultural consultant (2 wheels);Tub bench;Cane - single point;Wheelchair - manual;Hand held shower head;Grab bars - tub/shower;Adaptive equipment;Other (comment) (drop arm bsc)      Prior Function                Mobility Comments: amb with no AD with use of prosthetic, MOD I to mwc ADLs Comments: MOD I in ADL/IADL, 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 Dominance   Dominant Hand: Right    Extremity/Trunk Assessment   Upper Extremity Assessment Upper Extremity Assessment: Defer to OT evaluation    Lower Extremity Assessment Lower Extremity Assessment: RLE deficits/detail;LLE deficits/detail RLE Deficits / Details: new BKA, able to perform SLR LLE Deficits / Details: old BKA    Cervical / Trunk Assessment Cervical / Trunk Assessment: Normal  Communication   Communication: No difficulties  Cognition Arousal/Alertness: Awake/alert Behavior During Therapy: WFL for tasks assessed/performed Overall Cognitive Status: Within Functional Limits for tasks assessed                                 General Comments: tearful regarding current level of functioning        General Comments General comments (skin integrity, edema, etc.): HR incr to 130 bpm with transfer    Exercises Amputee Exercises Quad Sets: AROM, Both, 5 reps, Supine Hip Flexion/Marching: AROM, Both, 5 reps, Supine Straight Leg Raises: AROM, Both, 5 reps, Supine   Assessment/Plan    PT Assessment Patient needs continued PT services  PT Problem List Decreased activity tolerance;Decreased balance;Decreased mobility;Decreased knowledge of use of DME;Decreased safety awareness;Decreased knowledge of precautions;Pain       PT Treatment Interventions DME instruction;Functional mobility training;Therapeutic activities;Therapeutic exercise;Balance training;Patient/family education;Wheelchair mobility training    PT Goals (Current goals can be found in the Care Plan section)  Acute Rehab PT Goals Patient Stated Goal: to go home PT Goal Formulation: With patient Time For Goal Achievement: 04/07/22 Potential to Achieve Goals: Good    Frequency Min  5X/week     Co-evaluation               AM-PAC PT "6 Clicks" Mobility  Outcome Measure Help needed turning from your back to your side while in a flat bed without using bedrails?: None Help needed moving from lying on your back to sitting on the side of a flat bed without using bedrails?: None Help needed moving to and from a bed to a chair (including a wheelchair)?: A Lot Help needed standing up from a chair using your arms (e.g., wheelchair or bedside chair)?: Total Help needed to walk in hospital room?: Total Help needed climbing 3-5 steps with a railing? : Total 6 Click Score: 13    End of Session Equipment Utilized During Treatment: Gait belt Activity Tolerance: Patient limited by fatigue Patient left: in chair;with call bell/phone within reach Nurse Communication: Mobility status PT Visit Diagnosis: Muscle weakness (generalized) (M62.81);Pain Pain - Right/Left: Right Pain - part of body: Leg    Time: 0935-1005 PT Time Calculation (min) (ACUTE ONLY): 30 min   Charges:   PT Evaluation $PT Eval Moderate Complexity: 1 Mod PT Treatments $Therapeutic Activity: 8-22 mins  [facility] M,PT Acute Rehab Services [phone]   Bevelyn Buckles 03/24/2022, 1:41 PM

## 2022-03-24 NOTE — Inpatient Diabetes Management (Addendum)
Inpatient Diabetes Program Recommendations  AACE/ADA: New Consensus Statement on Inpatient Glycemic Control (2015)  Target Ranges:  Prepandial:   less than 140 mg/dL      Peak postprandial:   less than 180 mg/dL (1-2 hours)      Critically ill patients:  140 - 180 mg/dL   Lab Results  Component Value Date   GLUCAP 286 (H) 03/24/2022   HGBA1C 12.2 (A) 02/25/2022    Review of Glycemic Control  Latest Reference Range & Units 03/23/22 19:50 03/24/22 01:37 03/24/22 04:21 03/24/22 09:01 03/24/22 12:22  Glucose-Capillary 70 - 99 mg/dL 782 (H) 956 (H) 213 (H) 188 (H) 286 (H)  (H): Data is abnormally high Diabetes history: Type 2 DM Outpatient Diabetes medications: Levemir 50 units BID, Farxiga 10 mg QD (NT) Current orders for Inpatient glycemic control: Semglee 30 units QD, Novolog 0-20 units TID & HS  Inpatient Diabetes Program Recommendations:    Spoke with patient at length regarding outpatient diabetes management. Patient admits to missing doses frequently. Has Dexcom G6 at home and has used [employer] 3 in the past. Reports struggling with her diabetes because she forgets her insulin. Reviewed patient's current A1c of 12.2%. Explained what a A1c is and what it measures. Also reviewed goal A1c with patient, importance of good glucose control @ home, and blood sugar goals. Reviewed patho of DM, need for improved glycemic control, risk for continued infection with poor glycemic control, vascular changes and commorbidities.  Patient has meter and testing supplies, however, admits to not using them. Also, has a Dexcom G6 at home that she has never applied. Reports, "I just need help setting it up, Im terrible with that kind of stuff". Helped patient set up G6 application on phone and also noticed a downloaded Freestyle libre 3 application. Patient is interested in receiving Freestyle libre 3 prior to discharge. Reviewed importance of taking insulin as prescribed, goal setting, ideas to help  with remembering dosing and following up with providers. Patient has a [facility] clinic appointment for follow up.  Per patient, will have insurance in January.  Reports having multiple vials of Levemir at home. Not taking Marcelline Deist due to affordability. Discussed Relion insulin as a back measure for insulin to help improve access.  No further questions at this time.   Thanks, Lujean Rave, MSN, RNC-OB Diabetes Coordinator [phone] (8a-5p)

## 2022-03-25 ENCOUNTER — Other Ambulatory Visit (HOSPITAL_COMMUNITY): Payer: Self-pay

## 2022-03-25 ENCOUNTER — Encounter: Payer: Managed Care, Other (non HMO) | Admitting: Internal Medicine

## 2022-03-25 DIAGNOSIS — Z89512 Acquired absence of left leg below knee: Secondary | ICD-10-CM

## 2022-03-25 DIAGNOSIS — Z89511 Acquired absence of right leg below knee: Secondary | ICD-10-CM

## 2022-03-25 DIAGNOSIS — E1110 Type 2 diabetes mellitus with ketoacidosis without coma: Principal | ICD-10-CM

## 2022-03-25 DIAGNOSIS — E43 Unspecified severe protein-calorie malnutrition: Secondary | ICD-10-CM

## 2022-03-25 LAB — BASIC METABOLIC PANEL
Anion gap: 10 (ref 5–15)
BUN: 9 mg/dL (ref 6–20)
CO2: 25 mmol/L (ref 22–32)
Calcium: 6.9 mg/dL — ABNORMAL LOW (ref 8.9–10.3)
Chloride: 101 mmol/L (ref 98–111)
Creatinine, Ser: 0.52 mg/dL (ref 0.44–1.00)
GFR, Estimated: >60 mL/min (ref >60)
Glucose, Bld: 220 mg/dL — ABNORMAL HIGH (ref 70–99)
Potassium: 3.2 mmol/L — ABNORMAL LOW (ref 3.5–5.1)
Sodium: 136 mmol/L (ref 135–145)

## 2022-03-25 LAB — SURGICAL PATHOLOGY, GROSS ONLY (NOT ARMC)

## 2022-03-25 LAB — CBC
HCT: 24.4 % — ABNORMAL LOW (ref 36.0–46.0)
Hemoglobin: 7.7 g/dL — ABNORMAL LOW (ref 12.0–15.0)
MCH: 25.2 pg — ABNORMAL LOW (ref 26.0–34.0)
MCHC: 31.6 g/dL (ref 30.0–36.0)
MCV: 80 fL (ref 80.0–100.0)
Platelets: 224 10*3/uL (ref 150–400)
RBC: 3.05 MIL/uL — ABNORMAL LOW (ref 3.87–5.11)
RDW: 15 % (ref 11.5–15.5)
WBC: 4.1 10*3/uL (ref 4.0–10.5)
nRBC: 0 % (ref 0.0–0.2)

## 2022-03-25 LAB — GLUCOSE, CAPILLARY
Glucose-Capillary: 199 mg/dL — ABNORMAL HIGH (ref 70–99)
Glucose-Capillary: 177 mg/dL — ABNORMAL HIGH (ref 70–99)
Glucose-Capillary: 233 mg/dL — ABNORMAL HIGH (ref 70–99)

## 2022-03-25 MED ORDER — PREGABALIN 100 MG PO CAPS
ORAL_CAPSULE | ORAL | 0 refills | Status: DC
  Filled 2022-03-25: qty 120, 30d supply, fill #0
  Filled 2022-03-25: qty 106, 27d supply, fill #0
  Filled 2022-03-25: qty 14, 3d supply, fill #0

## 2022-03-25 MED ORDER — INSULIN PEN NEEDLE 32G X 4 MM MISC
0 refills | Status: DC
  Filled 2022-03-25: qty 100, 30d supply, fill #0

## 2022-03-25 MED ORDER — PRAVASTATIN SODIUM 40 MG PO TABS
40.0000 mg | ORAL_TABLET | Freq: Every day | ORAL | 0 refills | Status: DC
  Filled 2022-03-25: qty 30, 30d supply, fill #0

## 2022-03-25 MED ORDER — POTASSIUM CHLORIDE 20 MEQ PO PACK
40.0000 meq | PACK | Freq: Once | ORAL | Status: AC
  Administered 2022-03-25: 40 meq via ORAL
  Filled 2022-03-25: qty 2

## 2022-03-25 MED ORDER — OXYCODONE HCL 5 MG PO TABS
5.0000 mg | ORAL_TABLET | Freq: Four times a day (QID) | ORAL | 0 refills | Status: AC | PRN
  Filled 2022-03-25: qty 12, 3d supply, fill #0

## 2022-03-25 MED ORDER — SOLIQUA 100-33 UNT-MCG/ML ~~LOC~~ SOPN
30.0000 [IU] | PEN_INJECTOR | Freq: Every day | SUBCUTANEOUS | 2 refills | Status: DC
  Filled 2022-03-25: qty 9, 30d supply, fill #0

## 2022-03-25 MED ORDER — CITALOPRAM HYDROBROMIDE 40 MG PO TABS
40.0000 mg | ORAL_TABLET | Freq: Every day | ORAL | 0 refills | Status: DC
  Filled 2022-03-25: qty 30, 30d supply, fill #0

## 2022-03-25 MED ORDER — SODIUM CHLORIDE 0.9 % IV SOLN
250.0000 mg | Freq: Once | INTRAVENOUS | Status: AC
  Administered 2022-03-25: 250 mg via INTRAVENOUS
  Filled 2022-03-25: qty 20

## 2022-03-25 MED ORDER — DAPAGLIFLOZIN PROPANEDIOL 10 MG PO TABS
10.0000 mg | ORAL_TABLET | Freq: Every day | ORAL | 11 refills | Status: DC
  Filled 2022-03-25: qty 30, 30d supply, fill #0

## 2022-03-25 NOTE — Progress Notes (Signed)
IP rehab admissions - I met with patient and her daughter at the bedside.  Patient is comfortable with DC to home without CIR.  Patient has no insurance.  I will not pursue CIR at this point.  Call for questions.  [phone]

## 2022-03-25 NOTE — Progress Notes (Signed)
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 Pt reports increased pain in R residual limb with mobility but agreeable to transfer into recliner. Pt performed bed mobility with mod I using bed features and performed PA transfer into recliner with mod A using chuck pad as pt reports getting "stuck" to chair. Pt became emotional regarding current situation - provided emotional support and therapeutic listening and discussed returning to amputee support group once home. Recommend HHPT if able due to current strength, balance, and ROM impairments. Acute PT to cont to follow.     Recommendations for follow up therapy are one component of a multi-disciplinary discharge planning process, led by the attending physician.  Recommendations may be updated based on patient status, additional functional criteria and insurance authorization.  Follow Up Recommendations  Home health PT (Does not have coverage and has 24 hour support per pt)     Assistance Recommended at Discharge Frequent or constant Supervision/Assistance  Patient can return home with the following A little help with bathing/dressing/bathroom;Assistance with cooking/housework;Assist for transportation;Help with stairs or ramp for entrance;A lot of help with walking and/or transfers   Equipment Recommendations  None recommended by PT    Recommendations for Other Services       Precautions / Restrictions Precautions Precautions: Fall Precaution Comments: wound vac Required Braces or Orthoses: Other Brace Other Brace: limb protector right LE and left prosthesis Restrictions Weight Bearing  Restrictions: Yes RLE Weight Bearing: Non weight bearing     Mobility  Bed Mobility Overal bed mobility: Modified Independent Bed Mobility: Supine to Sit     Supine to sit: Modified independent (Device/Increase time)     General bed mobility comments: Pt transferred from semi-reclined in bed<>long sitting in bed and scooted to EOB to prepare for PA transfer into recliner Patient Response: Cooperative  Transfers Overall transfer level: Needs assistance   Transfers: Bed to chair/wheelchair/BSC         Anterior-Posterior transfers: Mod assist   General transfer comment: Pt performed PA transfer from bed<>recliner with mod A and pulling on chuck pad due to pt reports of "sticking" to chair. pt limited by pain during session.    Ambulation/Gait                   Stairs             Wheelchair Mobility    Modified Rankin (Stroke Patients Only)       Balance Overall balance assessment: Needs assistance Sitting-balance support: Bilateral upper extremity supported, Feet unsupported Sitting balance-Leahy Scale: Fair Sitting balance - Comments: required close supervision for dynamic sitting balance while preparing to perform PA transfer into recliner                                    Cognition Arousal/Alertness: Awake/alert Behavior During Therapy: [facility] for tasks assessed/performed Overall Cognitive Status: Within Functional Limits for tasks assessed  General Comments: daughter present at bedside, pt tearful regarding current level of functioning - discussed amputee support group        Exercises      General Comments        Pertinent Vitals/Pain Pain Assessment Pain Assessment: 0-10 Pain Score: 6  Pain Location: right residual limb Pain Descriptors / Indicators: Aching, Grimacing, Stabbing Pain Intervention(s): Limited activity within patient's tolerance, Monitored during session,  Repositioned, RN gave pain meds during session    Home Living                          Prior Function            PT Goals (current goals can now be found in the care plan section) Acute Rehab PT Goals Patient Stated Goal: to go home PT Goal Formulation: With patient Time For Goal Achievement: 04/07/22 Potential to Achieve Goals: Good Progress towards PT goals: Progressing toward goals    Frequency    Min 5X/week      PT Plan Current plan remains appropriate    Co-evaluation              AM-PAC PT "6 Clicks" Mobility   Outcome Measure  Help needed turning from your back to your side while in a flat bed without using bedrails?: None Help needed moving from lying on your back to sitting on the side of a flat bed without using bedrails?: None Help needed moving to and from a bed to a chair (including a wheelchair)?: A Lot Help needed standing up from a chair using your arms (e.g., wheelchair or bedside chair)?: Total Help needed to walk in hospital room?: Total Help needed climbing 3-5 steps with a railing? : Total 6 Click Score: 13    End of Session   Activity Tolerance: No increased pain Patient left: in chair;with call bell/phone within reach Nurse Communication: Mobility status PT Visit Diagnosis: Muscle weakness (generalized) (M62.81);Pain;Unsteadiness on feet (R26.81) Pain - Right/Left: Right Pain - part of body:  (residual limb)     Time: 8119-1478 PT Time Calculation (min) (ACUTE ONLY): 22 min  Charges:  $Therapeutic Activity: 8-22 mins                     Raechel Chute PT, DPT  Alfonso Patten 03/25/2022, 11:35 AM

## 2022-03-25 NOTE — Discharge Instructions (Addendum)
Dear Mrs. Salminen,  Thank you for trusting [place] with your care. We treated you for an infection in your foot.   Please continue to work with the occupational therapists at home. We will send you with a short course of pain medicine to take as needed.  Dressing care/ Wound VAC: Continue wound VAC with the Prevena plus pump at discharge for 1 week  Please ensure that you follow up with your primary care doctor on 04/07/22 at 1:15pm. You have an appointment with Dr. Lajoyce Corners scheduled 04/18/22 at 9:15am.  Please ensure that you control your blood sugar to help promote wound healing. Please call the [facility] clinic if you have trouble controlling your blood sugar.

## 2022-03-25 NOTE — Inpatient Diabetes Management (Signed)
Inpatient Diabetes Program Recommendations  AACE/ADA: New Consensus Statement on Inpatient Glycemic Control   Target Ranges:  Prepandial:   less than 140 mg/dL      Peak postprandial:   less than 180 mg/dL (1-2 hours)      Critically ill patients:  140 - 180 mg/dL    Latest Reference Range & Units 03/25/22 04:41 03/25/22 07:58  Glucose-Capillary 70 - 99 mg/dL 585 (H) 277 (H)    Latest Reference Range & Units 03/24/22 09:01 03/24/22 12:22 03/24/22 16:20 03/24/22 20:02 03/24/22 23:40  Glucose-Capillary 70 - 99 mg/dL 824 (H) 235 (H) 361 (H) 195 (H) 258 (H)   Review of Glycemic Control  Diabetes history: DM2 Outpatient Diabetes medications: Levemir 50 units BID, Farxiga 10 mg daily (not taking) Current orders for Inpatient glycemic control: Semglee 30 units daily, Novolog 0-20 units TID with meals and at bedtime  Inpatient Diabetes Program Recommendations:    Insulin: Please consider ordering Novolog 4 units TID with meals for meal coverage if patient eats at least 50% of meals.  Thanks, Orlando Penner, RN, MSN, CDCES Diabetes Coordinator Inpatient Diabetes Program [phone] (Team Pager from 8am to 5pm)

## 2022-03-25 NOTE — Progress Notes (Signed)
Patient ID: Lori Schmidt, female   DOB: 02-02-1976, 46 y.o.   MRN: [id number] Patient was connected to the Praveena plus portable wound VAC pump this had a good suction fit.  Patient was shown how to plug this and to keep it charged.  I will follow-up in the office in 1 week.

## 2022-03-25 NOTE — Plan of Care (Signed)

## 2022-03-25 NOTE — Discharge Summary (Signed)
Name: Lori Schmidt MRN: [id number] DOB: 05-31-1975 46 y.o. PCP: Rana SnareZheng, Michael, DO  Date of Admission: 03/22/2022 11:58 AM Date of Discharge: 03/25/22 Attending Physician: Dr. Cleda DaubE. Hoffman  Discharge Diagnosis: Principal Problem:   Subacute osteomyelitis, right ankle and foot (HCC) Active Problems:   Morbid obesity with BMI of 40.0-44.9, adult (HCC)   Diabetes mellitus (HCC)   S/P BKA (below knee amputation) unilateral, left (HCC)   Cellulitis   Severe protein-calorie malnutrition (HCC)   DKA, type 2 (HCC)   S/P BKA (below knee amputation) unilateral, right [facility](HCC)    Discharge Medications: Allergies as of 03/25/2022       Reactions   Lisinopril Cough   Sulfa Antibiotics Other (See Comments)   Makes sick        Medication List     STOP taking these medications    ALEVE PO       TAKE these medications    acetaminophen 325 MG tablet Commonly known as: TYLENOL Take 1-2 tablets (325-650 mg total) by mouth every 6 (six) hours as needed for mild pain (pain score 1-3 or temp > 100.5).   citalopram 40 MG tablet Commonly known as: CELEXA Take 1 tablet (40 mg total) by mouth at bedtime.   cyanocobalamin 1000 MCG tablet Commonly known as: VITAMIN B12 Take 1/2 tablet (500 mcg total) by mouth daily.   dapagliflozin propanediol 10 MG Tabs tablet Commonly known as: FARXIGA Take 1 tablet (10 mg total) by mouth daily before breakfast.   DEPO-PROVERA IM Inject into the muscle every 3 (three) months.   hydrOXYzine 10 MG tablet Commonly known as: ATARAX Take 1 tablet (10 mg total) by mouth 3 (three) times daily as needed.   Insulin Pen Needle 32G X 4 MM Misc Use as directed with Soliqua   losartan 25 MG tablet Commonly known as: COZAAR Take 1 tablet (25 mg total) by mouth at bedtime.   magnesium oxide 400 MG tablet Commonly known as: MAG-OX Take 1 tablet (400 mg total) by mouth daily.   oxyCODONE 5 MG immediate release tablet Commonly known as:  Roxicodone Take 1 tablet (5 mg total) by mouth every 6 (six) hours as needed for up to 3 days for severe pain.   pantoprazole 40 MG tablet Commonly known as: PROTONIX Take 1 tablet (40 mg total) by mouth daily.   pravastatin 40 MG tablet Commonly known as: PRAVACHOL Take 1 tablet (40 mg total) by mouth daily.   pregabalin 100 MG capsule Commonly known as: LYRICA Take 1 capsule (100 mg) by mouth twice daily and 2 capsules (200 mg total) nightly What changed:  how much to take how to take this when to take this   Soliqua 100-33 UNT-MCG/ML Sopn Generic drug: Insulin Glargine-Lixisenatide Inject 30 Units into the skin daily.   vitamin C 1000 MG tablet Take 1 tablet (1,000 mg total) by mouth daily.               Discharge Care Instructions  (From admission, onward)           Start     Ordered   03/25/22 0000  Leave dressing on - Keep it clean, dry, and intact until clinic visit        03/25/22 1235            Disposition and follow-up:   [name] was discharged from [facility] in Stable condition.  At the hospital follow up visit please address:  1.  Follow-up:  a.  Osteomyelitis right foot status post below-knee amputation.  Abscess ruled out on admission.  Discharged with short course pain control.  Blood cultures remain negative and source control obtained with clean margins no need for antibiotics.  Home health PT OT recommended and organized by case management.  Follow-up with Ortho on 04/18/2022    b.  Early DKA, diabetes type 2 with hyperglycemia-patient has Dexcom G6 that works with her phone.  Anticipates getting insurance in January.  Discharged with Marcelline Deist and long-acting insulin 30 units daily.  Consider GLP-1 if covered.   2.  Labs / imaging needed at time of follow-up: cbg  3.  Pending labs/ test needing follow-up: none  4.  Medication Changes  Started:  Stopped:  Changed:  Abx -   End Date:  Follow-up  Appointments:  Follow-up Information     Nadara Mustard, MD Follow up in 1 week(s).   Specialty: Orthopedic Surgery Contact information: [address] [place] [address] [phone]         Rana Snare, DO Follow up in 13 day(s).   Contact information: [address] [place] [address] [phone]         Care, [facility] Follow up.   Specialty: Home Health Services Why: Someone will call to schedule first home visit. Contact information: [address] [place] [address] [phone]                 Hospital Course by problem list:  Patient presented with right foot wound from orthopedic office.   patient was noted to have systemic symptoms significant for tachycardia soft BP nausea vomiting.  Sepsis ruled out as alternative etiology better explained her presenting symptoms.  Soft blood pressure and tachycardia from poor p.o. intake, dehydration.  Patient also has cardiac autonomic neuropathy with resting tachycardia.  Vomiting was likely related to infection.  Initial x-ray showed subcutaneous gas concerning for necrotizing fasciitis. She was started on linezolid and Zosyn for MRSA coverage to include coverage against toxin production.  Received fluids in the ED and once she was admitted as well.  Overnight patient continued to vomit, not controlled with Zofran, Reglan or Compazine.  After undergoing amputation with orthopedics many of her symptoms resolved.  She source control was achieved, and antibiotics were able to be discontinued 24 hours postop.  DVT prophylaxis was resumed, patient was evaluated by PT OT with home health PT OT recommended.  On admission patient was also noted to have anion gap of 20 with hyperglycemia and elevation in BHB.  Suspected that she was in early DKA secondary to acute infection.  She was started on IV fluids and IV insulin with gap closure overnight.  She was transitioned to subQ insulin in preparation for  procedure however dose was maintained at half home dose.  After procedure was resumed on normal dose 30 units.  Discharge Subjective: Doing well.  Does complain of some pain.  Wants to go home.  Discharge Exam:   BP (!) 93/59 (BP Location: Left Arm)   Pulse (!) 101   Temp 98.7 F (37.1 C) (Oral)   Resp (!) 22   Ht 5\' 7"  (1.702 m)   Wt 117.9 kg   SpO2 94%   BMI 40.72 kg/m  Constitutional: sitting in chair, in no acute distress HENT: normocephalic atraumatic Neck: supple Cardiovascular: tachycardic rate, regular rhythm, no m/r/g Pulmonary/Chest: normal work of breathing on room air, lungs clear to auscultation bilaterally Abdominal:  soft, non-tender, non-distended MSK: Surgically absent left lower extremity BKA, surgically absent right RLE Neurological: alert & oriented Skin: warm and dry Psych: Mood and affect appropriate  Pertinent Labs, Studies, and Procedures:     Latest Ref Rng & Units 03/25/2022    3:24 AM 03/24/2022    4:43 AM 03/23/2022    2:30 AM  CBC  WBC 4.0 - 10.5 K/uL 4.1  4.3  7.4   Hemoglobin 12.0 - 15.0 g/dL 7.7  8.5  8.9   Hematocrit 36.0 - 46.0 % 24.4  26.0  25.8   Platelets 150 - 400 K/uL 224  249  267        Latest Ref Rng & Units 03/25/2022    3:24 AM 03/24/2022    4:43 AM 03/23/2022    5:30 AM  CMP  Glucose 70 - 99 mg/dL 220  215  227   BUN 6 - 20 mg/dL 9  11  8    Creatinine 0.44 - 1.00 mg/dL 0.52  0.53  0.54   Sodium 135 - 145 mmol/L 136  138  135   Potassium 3.5 - 5.1 mmol/L 3.2  3.6  3.2   Chloride 98 - 111 mmol/L 101  104  102   CO2 22 - 32 mmol/L 25  25  22    Calcium 8.9 - 10.3 mg/dL 6.9  7.6  8.3     DG Foot Complete Right  Result Date: 03/22/2022 CLINICAL DATA:  Ulceration along the foot EXAM: RIGHT FOOT COMPLETE - 3+ VIEW COMPARISON:  2022/01/09 FINDINGS: Diffuse bony demineralization. Fragmentation and dislocation in the midfoot primarily at the Lisfranc joint, with extensive prior fragmentation of the cuboid, cuneiforms, and  navicular. Although quite possibly from Charcot arthropathy, the appearance is progressive from January 09, 2022 and there is gas in the soft tissues plantar to the midfoot and also in the soft tissues dorsal to the metatarsals compatible with substantial soft tissue infection reason the possibility of osteomyelitis for a component of the midfoot destructive findings. Questionable lucency in the talar head medially, cannot exclude talar head involvement. Abnormal gas tracks in the lateral ankle compatible with soft tissue infection extending up in the lateral ankle region. Plantar calcaneal spur noted. IMPRESSION: 1. Progressive fragmentation, destruction, and dislocation in the midfoot primarily at the Lisfranc joint, with extensive prior fragmentation of the cuboid, cuneiforms, and navicular. 2. Gas tracks in the soft tissues plantar to the midfoot and dorsal to the metatarsals compatible with substantial soft tissue infection, and raising the possibility of osteomyelitis as a component of midfoot destructive findings (as opposed to Charcot arthropathy). There is also soft tissue gas in the lateral ankle region. 3. Questionable lucency in the talar head medially, cannot exclude osteomyelitis. 4. Diffuse bony demineralization. Electronically Signed   By: Van Clines M.D.   On: 03/22/2022 13:30   DG Ankle Complete Right  Result Date: 03/22/2022 CLINICAL DATA:  Fever.  Skin ulceration. EXAM: RIGHT ANKLE - COMPLETE 3+ VIEW COMPARISON:  January 09, 2022 FINDINGS: Pronounced regional soft tissue swelling. Air/gas bubbles within the soft tissues lateral to the ankle region. Chronic neuropathic changes of the midfoot. Osteomyelitis not excluded in that region. IMPRESSION: Pronounced regional soft tissue swelling. Air/gas bubbles within the soft tissues lateral to the ankle region. Chronic neuropathic changes of the midfoot. Osteomyelitis not excluded in that region. Electronically Signed   By: Nelson Chimes M.D.   On:  03/22/2022 13:24     Discharge Instructions: Discharge Instructions     Call MD for:  difficulty breathing, headache or visual disturbances   Complete by: As directed    Call MD for:  extreme fatigue   Complete by: As directed    Call MD for:  hives   Complete by: As directed    Call MD for:  persistant dizziness or light-headedness   Complete by: As directed    Call MD for:  persistant nausea and vomiting   Complete by: As directed    Call MD for:  redness, tenderness, or signs of infection (pain, swelling, redness, odor or green/yellow discharge around incision site)   Complete by: As directed    Call MD for:  severe uncontrolled pain   Complete by: As directed    Call MD for:  temperature >100.4   Complete by: As directed    Diet - low sodium heart healthy   Complete by: As directed    Increase activity slowly   Complete by: As directed    Leave dressing on - Keep it clean, dry, and intact until clinic visit   Complete by: As directed    Negative Pressure Wound Therapy - Incisional   Complete by: As directed        Signed: Delene Ruffini, MD 03/25/2022, 3:05 PM   Pager: [phone]

## 2022-03-25 NOTE — TOC Transition Note (Signed)
Transition of Care [facility]) - CM/SW Discharge Note   Patient Details  Name: Lori Schmidt MRN: [id number] Date of Birth: 1975-11-27  Transition of Care [facility]) CM/SW Contact:  Tom-Johnson, Hershal Coria, RN Phone Number: 03/25/2022, 1:50 PM   Clinical Narrative:     Patient is scheduled for discharge today. Home health PT/OT referral secured by [facility] through [facility], info on AVS. Family to transport t discharge. No further TOC needs noted.     Final next level of care: Home w Home Health Services Barriers to Discharge: Barriers Resolved   Patient Goals and CMS Choice Patient states their goals for this hospitalization and ongoing recovery are:: To return home CMS Medicare.gov Compare Post Acute Care list provided to:: Patient Choice offered to / list presented to : Patient  Discharge Placement                Patient to be transferred to facility by: Family      Discharge Plan and Services In-house Referral: Financial Counselor              DME Arranged: N/A DME Agency: NA       HH Arranged: RN, OT HH Agency: [facility]) Date [facility] Agency Contacted: 03/25/22 Time HH Agency Contacted: 1300 Representative spoke with at [facility] Agency: Kandee Keen  Social Determinants of Health (SDOH) Interventions     Readmission Risk Interventions     No data to display

## 2022-03-25 NOTE — Inpatient Diabetes Management (Addendum)
   Briefly visited with patient and her daughter.  Brought FS Libre 3 for patient however she declined stating that now that she knows how to use the Dexcom G6 with her phone, she should be okay.  She wants to stay with the [facility] for now.   She anticipates getting insurance in January and therefore plans to see MD's then for f/u.  May consider weekly GLP-1 if this is covered by her insurance?  Notified resident that patient wants to stay with Dexcom for now.  Encouraged her to call the 1-800 number at d/c if she needs technical support.   Patient very appreciative of visit.    Thanks,  Beryl Meager, RN, BC-ADM Inpatient Diabetes Coordinator Pager [phone]  (8a-5p)

## 2022-03-27 LAB — CULTURE, BLOOD (ROUTINE X 2)
Culture: NO GROWTH
Culture: NO GROWTH
Special Requests: ADEQUATE
Special Requests: ADEQUATE

## 2022-03-28 ENCOUNTER — Telehealth: Payer: Self-pay | Admitting: Orthopedic Surgery

## 2022-03-28 NOTE — Telephone Encounter (Signed)
Patient needs to sch with Dr Lajoyce Corners for 1 week post op follow up. Dr Lajoyce Corners does not have anything please advise. Best number to reach the patient [phone]

## 2022-03-28 NOTE — Telephone Encounter (Signed)
This is supposed to go to [place], sorry!

## 2022-03-28 NOTE — Telephone Encounter (Signed)
Lori Schmidt has an appointment open on 03/30/22 at 9:30

## 2022-03-28 NOTE — Telephone Encounter (Signed)
Lori Schmidt- need order to continue PT, and any precautions, that need to be added to her treatment

## 2022-03-30 ENCOUNTER — Ambulatory Visit (INDEPENDENT_AMBULATORY_CARE_PROVIDER_SITE_OTHER): Payer: Self-pay | Admitting: Family

## 2022-03-30 ENCOUNTER — Encounter: Payer: Self-pay | Admitting: Family

## 2022-03-30 DIAGNOSIS — Z89511 Acquired absence of right leg below knee: Secondary | ICD-10-CM

## 2022-03-30 NOTE — Progress Notes (Signed)
Post-Op Visit Note   Patient: Lori Schmidt           Date of Birth: November 02, 1975           MRN: [id number] Visit Date: 03/30/2022 PCP: Rana Snare, DO  Chief Complaint:  Chief Complaint  Patient presents with   Right Leg - Routine Post Op    03/23/2022 right BKA kerecis graft     HPI:  HPI The patient is a 46 year old woman seen status post right below-knee amputation.  Wound VAC removed today. Ortho Exam On examination of the right residual limb that her incision is well-approximated there is no gaping or drainage no erythema  Visit Diagnoses: No diagnosis found.  Plan: Begin daily Dial soap cleansing.  Dry dressings.  Shrinker around-the-clock.  Follow-up in 2 weeks for suture removal. Follow-Up Instructions: No follow-ups on file.   Imaging: No results found.  Orders:  No orders of the defined types were placed in this encounter.  No orders of the defined types were placed in this encounter.    PMFS History: Patient Active Problem List   Diagnosis Date Noted   S/P BKA (below knee amputation) unilateral, right (HCC) 03/24/2022   Severe protein-calorie malnutrition (HCC) 03/23/2022   DKA, type 2 (HCC) 03/23/2022   Subacute osteomyelitis, right ankle and foot (HCC) 03/22/2022   Cellulitis 03/22/2022   Health care maintenance 02/25/2022   Venous insufficiency of right leg 12/01/2021   Leg edema, right 11/17/2021   Encounter for birth control 11/10/2021   Anxiety 10/26/2021   Abscess of skin and subcutaneous tissue 10/16/2021   Dyslipidemia    Hypoalbuminemia due to protein-calorie malnutrition (HCC)    Diabetic peripheral neuropathy (HCC)    Acute blood loss anemia    S/P BKA (below knee amputation) unilateral, left (HCC) 03/05/2021   Left below-knee amputee (HCC) 03/05/2021   Adjustment disorder with mixed anxiety and depressed mood    Pressure injury of skin 02/28/2021   Discitis of lumbar region    Diabetes mellitus (HCC)    Left leg  cellulitis    Morbid obesity with BMI of 40.0-44.9, adult (HCC) 01/05/2012   Past Medical History:  Diagnosis Date   Hyperlipidemia    Hypertension    Obesity    Type 2 diabetes mellitus (HCC)     Family History  Problem Relation Age of Onset   Hypothyroidism Mother    Diabetes Mother    Diabetes Maternal Grandmother    Breast cancer Paternal Grandmother     Past Surgical History:  Procedure Laterality Date   AMPUTATION Left 02/19/2021   Procedure: AMPUTATION BELOW KNEE;  Surgeon: Nadara Mustard, MD;  Location: [facility] OR;  Service: Orthopedics;  Laterality: Left;   AMPUTATION Right 03/23/2022   Procedure: RIGHT BELOW KNEE AMPUTATION;  Surgeon: Nadara Mustard, MD;  Location: [facility] OR;  Service: Orthopedics;  Laterality: Right;   BREAST CYST EXCISION Right 05/2018   four cysts removed on right breast/axilla area   TEE WITHOUT CARDIOVERSION N/A 02/23/2021   Procedure: TRANSESOPHAGEAL ECHOCARDIOGRAM (TEE);  Surgeon: Chrystie Nose, MD;  Location: [facility] ENDOSCOPY;  Service: Cardiovascular;  Laterality: N/A;   Social History   Occupational History   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

## 2022-04-07 ENCOUNTER — Encounter: Payer: Self-pay | Admitting: Student

## 2022-04-14 ENCOUNTER — Encounter: Payer: Self-pay | Admitting: Orthopedic Surgery

## 2022-04-14 ENCOUNTER — Ambulatory Visit (INDEPENDENT_AMBULATORY_CARE_PROVIDER_SITE_OTHER): Payer: Commercial Managed Care - HMO | Admitting: Orthopedic Surgery

## 2022-04-14 ENCOUNTER — Encounter (HOSPITAL_COMMUNITY): Payer: Self-pay | Admitting: Orthopedic Surgery

## 2022-04-14 DIAGNOSIS — T8781 Dehiscence of amputation stump: Secondary | ICD-10-CM

## 2022-04-14 NOTE — Progress Notes (Signed)
Office Visit Note   Patient: Lori Schmidt           Date of Birth: 07/25/1975           MRN: [id number] Visit Date: 04/14/2022              Requested by: Angelique Blonder, DO [address] [place],  [place] [address] PCP: Angelique Blonder, DO  Chief Complaint  Patient presents with   Right Leg - Routine Post Op    03/23/22 Right BKA: patient concerned about drainage from incision      HPI: Patient is a 47 year old woman who presents in follow-up status post a right transtibial amputation approximately 3 weeks ago.  Patient has had dehiscence of the residual limb.  Assessment & Plan: Visit Diagnoses:  1. Dehiscence of amputation stump of right lower extremity ([place])     Plan: Will plan for revision of the transtibial amputation.  Risk and benefits were discussed including need for additional surgery.  Patient states she understands wished to proceed at this time.  Follow-Up Instructions: Return in about 2 weeks (around 04/28/2022).   Ortho Exam  Patient is alert, oriented, no adenopathy, well-dressed, normal affect, normal respiratory effort. Examination patient has cellulitis and dehiscence of the surgical incision line.  There is purulent drainage.  Imaging: No results found.    Labs: Lab Results  Component Value Date   HGBA1C 12.2 (A) 02/25/2022   HGBA1C 10.1 (H) 10/18/2021   ESRSEDRATE 15 03/22/2021   ESRSEDRATE 25 (H) 03/15/2021   ESRSEDRATE 67 (H) 02/26/2021   CRP 0.7 03/22/2021   CRP 0.9 03/15/2021   CRP 7.5 (H) 02/26/2021   REPTSTATUS 03/27/2022 FINAL 03/22/2022   CULT  03/22/2022    NO GROWTH 5 DAYS Performed at [facility], [place] [address], [place], [place] [address]    LABORGA STAPHYLOCOCCUS AUREUS 02/16/2021     Lab Results  Component Value Date   ALBUMIN 2.1 (L) 03/22/2022   ALBUMIN 3.4 (L) 10/16/2021   ALBUMIN 2.7 (L) 03/22/2021    Lab Results  Component Value Date   MG 1.7 03/24/2022   MG 2.1 02/20/2021   MG 2.0 02/18/2021    No results found for: "VD25OH"  No results found for: "PREALBUMIN"    Latest Ref Rng & Units 03/25/2022    3:24 AM 03/24/2022    4:43 AM 03/23/2022    2:30 AM  CBC EXTENDED  WBC 4.0 - 10.5 K/uL 4.1  4.3  7.4   RBC 3.87 - 5.11 MIL/uL 3.05  3.35  3.42   Hemoglobin 12.0 - 15.0 g/dL 7.7  8.5  8.9   HCT 36.0 - 46.0 % 24.4  26.0  25.8   Platelets 150 - 400 K/uL 224  249  267      There is no height or weight on file to calculate BMI.  Orders:  No orders of the defined types were placed in this encounter.  No orders of the defined types were placed in this encounter.    Procedures: No procedures performed  Clinical Data: No additional findings.  ROS:  All other systems negative, except as noted in the HPI. Review of Systems  Objective: Vital Signs: There were no vitals taken for this visit.  Specialty Comments:  No specialty comments available.  PMFS History: Patient Active Problem List   Diagnosis Date Noted   S/P BKA (below knee amputation) unilateral, right ([place]) 03/24/2022   Severe protein-calorie malnutrition ([place]) 03/23/2022  DKA, type 2 ([place]) 03/23/2022   Subacute osteomyelitis, right ankle and foot ([place]) 03/22/2022   Cellulitis 03/22/2022   Health care maintenance 02/25/2022   Venous insufficiency of right leg 12/01/2021   Leg edema, right 11/17/2021   Encounter for birth control 11/10/2021   Anxiety 10/26/2021   Abscess of skin and subcutaneous tissue 10/16/2021   Dyslipidemia    Hypoalbuminemia due to protein-calorie malnutrition [facility])    Diabetic peripheral neuropathy (HCC)    Acute blood loss anemia    S/P BKA (below knee amputation) unilateral, left ([place]) 03/05/2021   Left below-knee amputee ([place]) 03/05/2021   Adjustment disorder with mixed anxiety and depressed mood    Pressure injury of skin 02/28/2021   Discitis of lumbar region    Diabetes mellitus ([place])    Left leg cellulitis    Morbid obesity with BMI of 40.0-44.9, adult ([place])  01/05/2012   Past Medical History:  Diagnosis Date   Hyperlipidemia    Hypertension    Obesity    Type 2 diabetes mellitus ([place])     Family History  Problem Relation Age of Onset   Hypothyroidism Mother    Diabetes Mother    Diabetes Maternal Grandmother    Breast cancer Paternal Grandmother     Past Surgical History:  Procedure Laterality Date   AMPUTATION Left 02/19/2021   Procedure: AMPUTATION BELOW KNEE;  Surgeon: Newt Minion, MD;  Location: [place];  Service: Orthopedics;  Laterality: Left;   AMPUTATION Right 03/23/2022   Procedure: RIGHT BELOW KNEE AMPUTATION;  Surgeon: Newt Minion, MD;  Location: [place];  Service: Orthopedics;  Laterality: Right;   BREAST CYST EXCISION Right 05/2018   four cysts removed on right breast/axilla area   TEE WITHOUT CARDIOVERSION N/A 02/23/2021   Procedure: TRANSESOPHAGEAL ECHOCARDIOGRAM (TEE);  Surgeon: Pixie Casino, MD;  Location: [facility] ENDOSCOPY;  Service: Cardiovascular;  Laterality: N/A;   Social History   Occupational History   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

## 2022-04-14 NOTE — Progress Notes (Signed)
PCP - Angelique Blonder, DO Cardiologist - n/a  Chest x-ray - 10/16/21 (1V) EKG - 03/22/22 Stress Test - n/a ECHO - 02/18/21 Cardiac Cath - n/a  ICD Pacemaker/Loop - n/a  Sleep Study -  n/a CPAP - none  Do not take Farxiga on the morning of surgery.  Hold for 72 hours prior procedure.  Last dose was on 04/12/22.  THE NIGHT BEFORE SURGERY, take 25 units of Levemir insulin.      THE MORNING OF SURGERY, take 25 units of Levemir insulin.    If your blood sugar is less than 70 mg/dL, you will need to treat for low blood sugar: Treat a low blood sugar (less than 70 mg/dL) with  cup of clear juice (cranberry or apple), 4 glucose tablets, OR glucose gel. Recheck blood sugar in 15 minutes after treatment (to make sure it is greater than 70 mg/dL). If your blood sugar is not greater than 70 mg/dL on recheck, call [phone] for further instructions.  ERAS: Clear liquids til 8:30 AM DOS  STOP now taking any Aspirin (unless otherwise instructed by your surgeon), Aleve, Naproxen, Ibuprofen, Motrin, Advil, Goody's, BC's, all herbal medications, fish oil, and all vitamins.   Coronavirus Screening Do you have any of the following symptoms:  Cough yes/no: No Fever (>100.53F)  yes/no: No Runny nose yes/no: No Sore throat yes/no: No Difficulty breathing/shortness of breath  yes/no: No  Have you traveled in the last 14 days and where? yes/no: No  Patient verbalized understanding of instructions that were given via phone.

## 2022-04-14 NOTE — H&P (Signed)
Lori Schmidt is an 47 y.o. female.   Chief Complaint: Cellulitis and drainage right transtibial amputation. HPI: Patient is 3 weeks status post right transtibial amputation.  Patient presents complaining of increasing redness swelling pain and drainage with dehiscence of the incision.  Past Medical History:  Diagnosis Date   Hyperlipidemia    Hypertension    Obesity    Type 2 diabetes mellitus ([place])     Past Surgical History:  Procedure Laterality Date   AMPUTATION Left 02/19/2021   Procedure: AMPUTATION BELOW KNEE;  Surgeon: Newt Minion, MD;  Location: [facility];  Service: Orthopedics;  Laterality: Left;   AMPUTATION Right 03/23/2022   Procedure: RIGHT BELOW KNEE AMPUTATION;  Surgeon: Newt Minion, MD;  Location: [facility];  Service: Orthopedics;  Laterality: Right;   BREAST CYST EXCISION Right 05/2018   four cysts removed on right breast/axilla area   TEE WITHOUT CARDIOVERSION N/A 02/23/2021   Procedure: TRANSESOPHAGEAL ECHOCARDIOGRAM (TEE);  Surgeon: Pixie Casino, MD;  Location: [facility] ENDOSCOPY;  Service: Cardiovascular;  Laterality: N/A;    Family History  Problem Relation Age of Onset   Hypothyroidism Mother    Diabetes Mother    Diabetes Maternal Grandmother    Breast cancer Paternal Grandmother    Social History:  reports that she quit smoking about 11 years ago. Her smoking use included cigarettes. She has never used smokeless tobacco. She reports that she does not drink alcohol and does not use drugs.  Allergies:  Allergies  Allergen Reactions   Lisinopril Cough   Sulfa Antibiotics Other (See Comments)    Makes sick    No medications prior to admission.    No results found for this or any previous visit (from the past 48 hour(s)). No results found.  Review of Systems  All other systems reviewed and are negative.   Height 5\' 7"  (1.702 m), weight 123.7 kg. Physical Exam  Patient is alert, oriented, no adenopathy, well-dressed, normal affect, normal  respiratory effort. Examination patient has cellulitis and dehiscence of the surgical incision line.  There is purulent drainage. Assessment/Plan 1. Dehiscence of amputation stump of right lower extremity ([place])       Plan: Will plan for revision of the transtibial amputation.  Risk and benefits were discussed including need for additional surgery.  Patient states she understands wished to proceed at this time.  Newt Minion, MD 04/14/2022, 6:08 PM

## 2022-04-15 ENCOUNTER — Encounter (HOSPITAL_COMMUNITY): Payer: Self-pay | Admitting: Orthopedic Surgery

## 2022-04-15 ENCOUNTER — Ambulatory Visit (HOSPITAL_COMMUNITY)
Admission: RE | Admit: 2022-04-15 | Discharge: 2022-04-15 | Disposition: A | Discharge status: Home / Self Care | Payer: Commercial Managed Care - HMO | Attending: Orthopedic Surgery | Admitting: Orthopedic Surgery

## 2022-04-15 ENCOUNTER — Other Ambulatory Visit: Payer: Self-pay

## 2022-04-15 ENCOUNTER — Ambulatory Visit (HOSPITAL_COMMUNITY): Payer: Commercial Managed Care - HMO | Admitting: Certified Registered Nurse Anesthetist

## 2022-04-15 ENCOUNTER — Ambulatory Visit (HOSPITAL_BASED_OUTPATIENT_CLINIC_OR_DEPARTMENT_OTHER): Payer: Commercial Managed Care - HMO | Admitting: Certified Registered Nurse Anesthetist

## 2022-04-15 ENCOUNTER — Encounter (HOSPITAL_COMMUNITY)
Admission: RE | Disposition: A | Discharge status: Home / Self Care | Payer: Self-pay | Source: Home / Self Care | Attending: Orthopedic Surgery

## 2022-04-15 DIAGNOSIS — E119 Type 2 diabetes mellitus without complications: Secondary | ICD-10-CM | POA: Diagnosis not present

## 2022-04-15 DIAGNOSIS — E1165 Type 2 diabetes mellitus with hyperglycemia: Secondary | ICD-10-CM | POA: Diagnosis not present

## 2022-04-15 DIAGNOSIS — Z89511 Acquired absence of right leg below knee: Secondary | ICD-10-CM | POA: Diagnosis not present

## 2022-04-15 DIAGNOSIS — Z87891 Personal history of nicotine dependence: Secondary | ICD-10-CM | POA: Insufficient documentation

## 2022-04-15 DIAGNOSIS — G709 Myoneural disorder, unspecified: Secondary | ICD-10-CM | POA: Diagnosis not present

## 2022-04-15 DIAGNOSIS — Z79899 Other long term (current) drug therapy: Secondary | ICD-10-CM | POA: Insufficient documentation

## 2022-04-15 DIAGNOSIS — F419 Anxiety disorder, unspecified: Secondary | ICD-10-CM

## 2022-04-15 DIAGNOSIS — T8781 Dehiscence of amputation stump: Secondary | ICD-10-CM

## 2022-04-15 DIAGNOSIS — Y835 Amputation of limb(s) as the cause of abnormal reaction of the patient, or of later complication, without mention of misadventure at the time of the procedure: Secondary | ICD-10-CM | POA: Insufficient documentation

## 2022-04-15 DIAGNOSIS — E669 Obesity, unspecified: Secondary | ICD-10-CM | POA: Diagnosis not present

## 2022-04-15 DIAGNOSIS — M1990 Unspecified osteoarthritis, unspecified site: Secondary | ICD-10-CM | POA: Diagnosis not present

## 2022-04-15 DIAGNOSIS — E785 Hyperlipidemia, unspecified: Secondary | ICD-10-CM | POA: Insufficient documentation

## 2022-04-15 DIAGNOSIS — Z6839 Body mass index (BMI) 39.0-39.9, adult: Secondary | ICD-10-CM | POA: Insufficient documentation

## 2022-04-15 DIAGNOSIS — I10 Essential (primary) hypertension: Secondary | ICD-10-CM

## 2022-04-15 HISTORY — PX: STUMP REVISION: SHX6102

## 2022-04-15 LAB — GLUCOSE, CAPILLARY
Glucose-Capillary: 361 mg/dL — ABNORMAL HIGH (ref 70–99)
Glucose-Capillary: 287 mg/dL — ABNORMAL HIGH (ref 70–99)
Glucose-Capillary: 301 mg/dL — ABNORMAL HIGH (ref 70–99)
Glucose-Capillary: 295 mg/dL — ABNORMAL HIGH (ref 70–99)

## 2022-04-15 LAB — POCT I-STAT, CHEM 8
BUN: 14 mg/dL (ref 6–20)
Calcium, Ion: 1.14 mmol/L — ABNORMAL LOW (ref 1.15–1.40)
Chloride: 100 mmol/L (ref 98–111)
Creatinine, Ser: 0.2 mg/dL — ABNORMAL LOW (ref 0.44–1.00)
Glucose, Bld: 364 mg/dL — ABNORMAL HIGH (ref 70–99)
HCT: 30 % — ABNORMAL LOW (ref 36.0–46.0)
Hemoglobin: 10.2 g/dL — ABNORMAL LOW (ref 12.0–15.0)
Potassium: 4.2 mmol/L (ref 3.5–5.1)
Sodium: 136 mmol/L (ref 135–145)
TCO2: 24 mmol/L (ref 22–32)

## 2022-04-15 LAB — POCT PREGNANCY, URINE: Preg Test, Ur: NEGATIVE

## 2022-04-15 SURGERY — REVISION, AMPUTATION SITE
Anesthesia: General | Site: Knee | Laterality: Right

## 2022-04-15 MED ORDER — AMISULPRIDE (ANTIEMETIC) 5 MG/2ML IV SOLN
10.0000 mg | Freq: Once | INTRAVENOUS | Status: AC | PRN
  Administered 2022-04-15: 10 mg via INTRAVENOUS

## 2022-04-15 MED ORDER — LIDOCAINE-EPINEPHRINE (PF) 1.5 %-1:200000 IJ SOLN
INTRAMUSCULAR | Status: DC | PRN
  Administered 2022-04-15: 5 mL via PERINEURAL

## 2022-04-15 MED ORDER — PROPOFOL 10 MG/ML IV BOLUS
INTRAVENOUS | Status: DC | PRN
  Administered 2022-04-15: 20 mg via INTRAVENOUS
  Administered 2022-04-15: 60 mg via INTRAVENOUS
  Administered 2022-04-15: 120 mg via INTRAVENOUS

## 2022-04-15 MED ORDER — 0.9 % SODIUM CHLORIDE (POUR BTL) OPTIME
TOPICAL | Status: DC | PRN
  Administered 2022-04-15: 1000 mL

## 2022-04-15 MED ORDER — BUPIVACAINE-EPINEPHRINE (PF) 0.5% -1:200000 IJ SOLN
INTRAMUSCULAR | Status: DC | PRN
  Administered 2022-04-15: 25 mL via PERINEURAL

## 2022-04-15 MED ORDER — OXYCODONE HCL 5 MG/5ML PO SOLN: 5.0000 mg | Freq: Once | ORAL | Status: AC | PRN

## 2022-04-15 MED ORDER — OXYCODONE HCL 10 MG PO TABS: 10.0000 mg | ORAL_TABLET | ORAL | 0 refills | Status: DC | PRN

## 2022-04-15 MED ORDER — FENTANYL CITRATE (PF) 100 MCG/2ML IJ SOLN
25.0000 ug | INTRAMUSCULAR | Status: DC | PRN
  Administered 2022-04-15 (×2): 50 ug via INTRAVENOUS

## 2022-04-15 MED ORDER — FENTANYL CITRATE (PF) 100 MCG/2ML IJ SOLN
INTRAMUSCULAR | Status: AC
  Filled 2022-04-15: qty 2

## 2022-04-15 MED ORDER — INSULIN ASPART 100 UNIT/ML IJ SOLN
INTRAMUSCULAR | Status: AC
  Administered 2022-04-15: 10 [IU] via SUBCUTANEOUS
  Filled 2022-04-15: qty 1

## 2022-04-15 MED ORDER — HYDROMORPHONE HCL 1 MG/ML IJ SOLN
INTRAMUSCULAR | Status: AC
  Filled 2022-04-15: qty 1

## 2022-04-15 MED ORDER — OXYCODONE HCL 5 MG PO TABS
ORAL_TABLET | ORAL | Status: AC
  Filled 2022-04-15: qty 1

## 2022-04-15 MED ORDER — CEFAZOLIN SODIUM-DEXTROSE 2-4 GM/100ML-% IV SOLN
2.0000 g | INTRAVENOUS | Status: AC
  Administered 2022-04-15: 2 g via INTRAVENOUS
  Filled 2022-04-15: qty 100

## 2022-04-15 MED ORDER — ONDANSETRON HCL 4 MG/2ML IJ SOLN
INTRAMUSCULAR | Status: DC | PRN
  Administered 2022-04-15: 4 mg via INTRAVENOUS

## 2022-04-15 MED ORDER — OXYCODONE HCL 5 MG PO TABS
5.0000 mg | ORAL_TABLET | Freq: Once | ORAL | Status: AC | PRN
  Administered 2022-04-15: 5 mg via ORAL

## 2022-04-15 MED ORDER — LIDOCAINE 2% (20 MG/ML) 5 ML SYRINGE
INTRAMUSCULAR | Status: DC | PRN
  Administered 2022-04-15: 60 mg via INTRAVENOUS

## 2022-04-15 MED ORDER — ACETAMINOPHEN 10 MG/ML IV SOLN
INTRAVENOUS | Status: AC
  Filled 2022-04-15: qty 100

## 2022-04-15 MED ORDER — AMISULPRIDE (ANTIEMETIC) 5 MG/2ML IV SOLN
INTRAVENOUS | Status: AC
  Filled 2022-04-15: qty 4

## 2022-04-15 MED ORDER — DEXAMETHASONE SODIUM PHOSPHATE 10 MG/ML IJ SOLN
INTRAMUSCULAR | Status: DC | PRN
  Administered 2022-04-15: 4 mg via INTRAVENOUS

## 2022-04-15 MED ORDER — ONDANSETRON HCL 4 MG/2ML IJ SOLN
INTRAMUSCULAR | Status: AC
  Filled 2022-04-15: qty 2

## 2022-04-15 MED ORDER — PROPOFOL 10 MG/ML IV BOLUS
INTRAVENOUS | Status: AC
  Filled 2022-04-15: qty 20

## 2022-04-15 MED ORDER — INSULIN ASPART 100 UNIT/ML IJ SOLN
10.0000 [IU] | Freq: Once | INTRAMUSCULAR | Status: AC
  Administered 2022-04-15: 10 [IU] via SUBCUTANEOUS

## 2022-04-15 MED ORDER — LACTATED RINGERS IV SOLN: INTRAVENOUS | Status: DC

## 2022-04-15 MED ORDER — ACETAMINOPHEN 160 MG/5ML PO SOLN: 1000.0000 mg | Freq: Once | ORAL | Status: DC | PRN

## 2022-04-15 MED ORDER — INSULIN ASPART 100 UNIT/ML IJ SOLN: 10.0000 [IU] | Freq: Once | INTRAMUSCULAR | Status: AC

## 2022-04-15 MED ORDER — FENTANYL CITRATE (PF) 250 MCG/5ML IJ SOLN
INTRAMUSCULAR | Status: AC
  Filled 2022-04-15: qty 5

## 2022-04-15 MED ORDER — TRANEXAMIC ACID-NACL 1000-0.7 MG/100ML-% IV SOLN
1000.0000 mg | INTRAVENOUS | Status: AC
  Administered 2022-04-15: 1000 mg via INTRAVENOUS
  Filled 2022-04-15: qty 100

## 2022-04-15 MED ORDER — FENTANYL CITRATE (PF) 250 MCG/5ML IJ SOLN
INTRAMUSCULAR | Status: DC | PRN
  Administered 2022-04-15 (×5): 50 ug via INTRAVENOUS

## 2022-04-15 MED ORDER — HYDROMORPHONE HCL 1 MG/ML IJ SOLN
0.2500 mg | INTRAMUSCULAR | Status: DC | PRN
  Administered 2022-04-15 (×4): 0.5 mg via INTRAVENOUS

## 2022-04-15 MED ORDER — MIDAZOLAM HCL 2 MG/2ML IJ SOLN
INTRAMUSCULAR | Status: AC
  Filled 2022-04-15: qty 2

## 2022-04-15 MED ORDER — ORAL CARE MOUTH RINSE: 15.0000 mL | Freq: Once | OROMUCOSAL | Status: AC

## 2022-04-15 MED ORDER — ONDANSETRON HCL 4 MG/2ML IJ SOLN
4.0000 mg | Freq: Once | INTRAMUSCULAR | Status: AC | PRN
  Administered 2022-04-15: 4 mg via INTRAVENOUS

## 2022-04-15 MED ORDER — ACETAMINOPHEN 500 MG PO TABS: 1000.0000 mg | ORAL_TABLET | Freq: Once | ORAL | Status: DC | PRN

## 2022-04-15 MED ORDER — MIDAZOLAM HCL 2 MG/2ML IJ SOLN
INTRAMUSCULAR | Status: DC | PRN
  Administered 2022-04-15: 2 mg via INTRAVENOUS

## 2022-04-15 MED ORDER — CHLORHEXIDINE GLUCONATE 0.12 % MT SOLN
15.0000 mL | Freq: Once | OROMUCOSAL | Status: AC
  Administered 2022-04-15: 15 mL via OROMUCOSAL
  Filled 2022-04-15: qty 15

## 2022-04-15 MED ORDER — INSULIN DETEMIR 100 UNIT/ML ~~LOC~~ SOLN
50.0000 [IU] | Freq: Once | SUBCUTANEOUS | Status: AC
  Administered 2022-04-15: 50 [IU] via SUBCUTANEOUS
  Filled 2022-04-15: qty 0.5

## 2022-04-15 MED ORDER — ACETAMINOPHEN 10 MG/ML IV SOLN
1000.0000 mg | Freq: Once | INTRAVENOUS | Status: DC | PRN
  Administered 2022-04-15: 1000 mg via INTRAVENOUS

## 2022-04-15 SURGICAL SUPPLY — 35 items
BAG COUNTER SPONGE SURGICOUNT (BAG) ×1 IMPLANT
BAG SPNG CNTER NS LX DISP (BAG) ×1
BLADE SAW RECIP 87.9 MT (BLADE) IMPLANT
BLADE SURG 21 STRL SS (BLADE) ×1 IMPLANT
BNDG CMPR 5X4 CHSV STRCH STRL (GAUZE/BANDAGES/DRESSINGS) ×1
BNDG COHESIVE 4X5 TAN STRL LF (GAUZE/BANDAGES/DRESSINGS) IMPLANT
CANISTER WOUND CARE 500ML ATS (WOUND CARE) ×1 IMPLANT
COVER SURGICAL LIGHT HANDLE (MISCELLANEOUS) ×1 IMPLANT
DRAPE EXTREMITY T 121X128X90 (DISPOSABLE) ×1 IMPLANT
DRAPE HALF SHEET 40X57 (DRAPES) ×1 IMPLANT
DRAPE INCISE IOBAN 66X45 STRL (DRAPES) ×1 IMPLANT
DRAPE U-SHAPE 47X51 STRL (DRAPES) ×2 IMPLANT
DRESSING PREVENA PLUS CUSTOM (GAUZE/BANDAGES/DRESSINGS) ×1 IMPLANT
DRSG PREVENA PLUS CUSTOM (GAUZE/BANDAGES/DRESSINGS) ×1
DURAPREP 26ML APPLICATOR (WOUND CARE) ×1 IMPLANT
ELECT REM PT RETURN 9FT ADLT (ELECTROSURGICAL) ×1
ELECTRODE REM PT RTRN 9FT ADLT (ELECTROSURGICAL) ×1 IMPLANT
GLOVE BIOGEL PI IND STRL 9 (GLOVE) ×1 IMPLANT
GLOVE SURG ORTHO 9.0 STRL STRW (GLOVE) ×1 IMPLANT
GOWN STRL REUS W/ TWL XL LVL3 (GOWN DISPOSABLE) ×2 IMPLANT
GOWN STRL REUS W/TWL XL LVL3 (GOWN DISPOSABLE) ×2
GRAFT SKIN WND MICRO 38 (Tissue) IMPLANT
KIT BASIN OR (CUSTOM PROCEDURE TRAY) ×1 IMPLANT
KIT TURNOVER KIT B (KITS) ×1 IMPLANT
MANIFOLD NEPTUNE II (INSTRUMENTS) ×1 IMPLANT
NS IRRIG 1000ML POUR BTL (IV SOLUTION) ×1 IMPLANT
PACK GENERAL/GYN (CUSTOM PROCEDURE TRAY) ×1 IMPLANT
PAD ARMBOARD 7.5X6 YLW CONV (MISCELLANEOUS) ×1 IMPLANT
PREVENA RESTOR ARTHOFORM 46X30 (CANNISTER) ×1 IMPLANT
PREVENA RESTOR AXIOFORM 29X28 (GAUZE/BANDAGES/DRESSINGS) IMPLANT
STAPLER VISISTAT 35W (STAPLE) IMPLANT
SUT ETHILON 2 0 PSLX (SUTURE) ×2 IMPLANT
SUT SILK 2 0 (SUTURE)
SUT SILK 2-0 18XBRD TIE 12 (SUTURE) IMPLANT
TOWEL GREEN STERILE (TOWEL DISPOSABLE) ×1 IMPLANT

## 2022-04-15 NOTE — Progress Notes (Signed)
Orthopedic Tech Progress Note Patient Details:  Lori Schmidt 01-May-1975 [id number]  Patient ID: Lori Schmidt, female   DOB: 1976/02/03, 47 y.o.   MRN: [id number] Order placed with [employer] for shrinker. [facility] 04/15/2022, 1:06 PM

## 2022-04-15 NOTE — Anesthesia Procedure Notes (Signed)
Procedure Name: LMA Insertion Date/Time: 04/15/2022 10:43 AM  Performed by: Darletta Moll, CRNAPre-anesthesia Checklist: Patient identified, Emergency Drugs available, Suction available and Patient being monitored Patient Re-evaluated:Patient Re-evaluated prior to induction Oxygen Delivery Method: Circle system utilized Preoxygenation: Pre-oxygenation with 100% oxygen Induction Type: IV induction Ventilation: Mask ventilation without difficulty LMA: LMA inserted LMA Size: 4.0 Number of attempts: 1 Placement Confirmation: positive ETCO2, breath sounds checked- equal and bilateral and CO2 detector Tube secured with: Tape Dental Injury: Teeth and Oropharynx as per pre-operative assessment

## 2022-04-15 NOTE — Progress Notes (Signed)
Diabetic educator presnt and talking with pt + mom re: meds/ therapies, resource to better manage  diabetic needs

## 2022-04-15 NOTE — Anesthesia Procedure Notes (Signed)
Anesthesia Regional Block: Popliteal block   Pre-Anesthetic Checklist: , timeout performed,  Correct Patient, Correct Site, Correct Laterality,  Correct Procedure, Correct Position, site marked,  Risks and benefits discussed,  Surgical consent,  Pre-op evaluation,  At surgeon's request and post-op pain management  Laterality: Right and Lower  Prep: chloraprep       Needles:  Injection technique: Single-shot      Needle Length: 9cm  Needle Gauge: 22     Additional Needles: Arrow StimuQuik ECHO Echogenic Stimulating PNB Needle  Procedures:,,,, ultrasound used (permanent image in chart),,    Narrative:  Start time: 04/15/2022 1:01 PM End time: 04/15/2022 1:07 PM Injection made incrementally with aspirations every 5 mL.  Performed by: Personally  Anesthesiologist: Oleta Mouse, MD

## 2022-04-15 NOTE — Transfer of Care (Signed)
Immediate Anesthesia Transfer of Care Note  Patient: Lori Schmidt  Procedure(s) Performed: REVISION RIGHT BELOW KNEE AMPUTATION (Right: Knee)  Patient Location: PACU  Anesthesia Type:General  Level of Consciousness: drowsy and patient cooperative  Airway & Oxygen Therapy: Patient Spontanous Breathing and Patient connected to nasal cannula oxygen  Post-op Assessment: Report given to RN, Post -op Vital signs reviewed and stable, and Patient moving all extremities X 4  Post vital signs: Reviewed and stable  Last Vitals:  Vitals Value Taken Time  BP 126/71 04/15/22 1126  Temp 36.7 C 04/15/22 1130  Pulse 112 04/15/22 1130  Resp 11 04/15/22 1130  SpO2 92 % 04/15/22 1130  Vitals shown include unvalidated device data.  Last Pain:  Vitals:   04/15/22 0831  TempSrc:   PainSc: 0-No pain         Complications: No notable events documented.

## 2022-04-15 NOTE — Interval H&P Note (Signed)
History and Physical Interval Note:  04/15/2022 8:51 AM  Lori Schmidt  has presented today for surgery, with the diagnosis of Dehiscence Right Below Knee Amputation.  The various methods of treatment have been discussed with the patient and family. After consideration of risks, benefits and other options for treatment, the patient has consented to  Procedure(s): REVISION RIGHT BELOW KNEE AMPUTATION (Right) as a surgical intervention.  The patient's history has been reviewed, patient examined, no change in status, stable for surgery.  I have reviewed the patient's chart and labs.  Questions were answered to the patient's satisfaction.     Newt Minion

## 2022-04-15 NOTE — Anesthesia Preprocedure Evaluation (Signed)
Anesthesia Evaluation  Patient identified by MRN, date of birth, ID band Patient awake    Reviewed: Allergy & Precautions, NPO status , Patient's Chart, lab work & pertinent test results  History of Anesthesia Complications Negative for: history of anesthetic complications  Airway Mallampati: III  TM Distance: >3 FB Neck ROM: Full    Dental  (+) Dental Advisory Given   Pulmonary neg shortness of breath, neg sleep apnea, neg COPD, neg recent URI, former smoker   breath sounds clear to auscultation       Cardiovascular hypertension, Pt. on medications (-) angina (-) Past MI and (-) CHF  Rhythm:Regular    1. Left ventricular ejection fraction, by estimation, is 60 to 65%. The  left ventricle has normal function. There is mild left ventricular  hypertrophy.   2. Right ventricular systolic function is normal. The right ventricular  size is normal.   3. No left atrial/left atrial appendage thrombus was detected.   4. The mitral valve is abnormal. Trivial mitral valve regurgitation.  There is mild late systolic prolapse of the middle scallop of the  posterior leaflet of the mitral valve.   5. The aortic valve is tricuspid. Aortic valve regurgitation is not  visualized.     Neuro/Psych  PSYCHIATRIC DISORDERS Anxiety      Neuromuscular disease    GI/Hepatic negative GI ROS, Neg liver ROS,,,  Endo/Other  diabetes, Poorly Controlled  Lab Results      Component                Value               Date                      HGBA1C                   12.2 (A)            02/25/2022             Renal/GU Lab Results      Component                Value               Date                      CREATININE               0.20 (L)            04/15/2022                Musculoskeletal  (+) Arthritis ,    Abdominal   Peds  Hematology  (+) Blood dyscrasia, anemia Lab Results      Component                Value               Date                       WBC                      4.1                 03/25/2022                HGB  10.2 (L)            04/15/2022                HCT                      30.0 (L)            04/15/2022                MCV                      80.0                03/25/2022                PLT                      224                 03/25/2022              Anesthesia Other Findings   Reproductive/Obstetrics                             Anesthesia Physical Anesthesia Plan  ASA: 4  Anesthesia Plan: General   Post-op Pain Management:    Induction: Intravenous  PONV Risk Score and Plan: 3 and Ondansetron and Midazolam  Airway Management Planned: LMA  Additional Equipment: None  Intra-op Plan:   Post-operative Plan: Extubation in OR  Informed Consent: I have reviewed the patients History and Physical, chart, labs and discussed the procedure including the risks, benefits and alternatives for the proposed anesthesia with the patient or authorized representative who has indicated his/her understanding and acceptance.     Dental advisory given  Plan Discussed with: CRNA  Anesthesia Plan Comments:        Anesthesia Quick Evaluation

## 2022-04-15 NOTE — Op Note (Signed)
04/15/2022  11:43 AM  PATIENT:  Lori Schmidt    PRE-OPERATIVE DIAGNOSIS:  Dehiscence Right Below Knee Amputation  POST-OPERATIVE DIAGNOSIS:  Same  PROCEDURE:  REVISION RIGHT BELOW KNEE AMPUTATION Application Kerecis micro graft 38 cm. Application of Prevena customizable and Arnell Sieving form wound VAC.  SURGEON:  Newt Minion, MD  PHYSICIAN ASSISTANT:None ANESTHESIA:   General  PREOPERATIVE INDICATIONS:  [name] is a  47 y.o. female with a diagnosis of Dehiscence Right Below Knee Amputation who failed conservative measures and elected for surgical management.    The risks benefits and alternatives were discussed with the patient preoperatively including but not limited to the risks of infection, bleeding, nerve injury, cardiopulmonary complications, the need for revision surgery, among others, and the patient was willing to proceed.  OPERATIVE IMPLANTS: Kerecis micro graft 38 cm.  @ENCIMAGES @  OPERATIVE FINDINGS: Good petechial bleeding.  Wound margins were clear.  Infected soft tissue sent for cultures.  OPERATIVE PROCEDURE: Patient was brought the operating room and underwent a general anesthetic.  After adequate levels anesthesia were obtained patient's right lower extremity was prepped using DuraPrep draped into a sterile field a timeout was called.  A fishmouth incision was made around the ulcerative dehisced tissue through clear tissue planes.  This was carried down to bone.  The distal centimeter of the tibia and fibula were resected beveled anteriorly.  Electrocautery was used for hemostasis the wound was irrigated with normal saline.  The wound was filled with 38 cm of Kerecis micro graft cover surface area of 200 cm.  The deep and superficial fascial layers and skin was closed with 2-0 nylon.  Wound was covered with Prevena customizable dressing and PRAFO arthroform wound VAC.  This had a good suction fit this was covered with the stump shrinker and limb  protector.  Patient was extubated taken to PACU in stable condition.   DISCHARGE PLANNING:  Antibiotic duration: Preoperative antibiotics will add postop antibiotics depending on culture sensitivities.  Weightbearing: Nonweightbearing on the right.  Pain medication: Prescription for oxycodone 10 mg  Dressing care/ Wound VAC: Continue wound VAC for 1 week  Ambulatory devices: Wheelchair  Discharge to: Home.  Follow-up: In the office 1 week post operative.

## 2022-04-15 NOTE — Inpatient Diabetes Management (Signed)
Inpatient Diabetes Program Recommendations  AACE/ADA: New Consensus Statement on Inpatient Glycemic Control (2015)  Target Ranges:  Prepandial:   less than 140 mg/dL      Peak postprandial:   less than 180 mg/dL (1-2 hours)      Critically ill patients:  140 - 180 mg/dL   Lab Results  Component Value Date   GLUCAP 295 (H) 04/15/2022   HGBA1C 12.2 (A) 02/25/2022    Review of Glycemic Control  Diabetes history: type 2 (originally gestational diabetes) Outpatient Diabetes medications: Levemir 50 units BID Current orders for Inpatient glycemic control: none; Anesthesia provided insulin coverage  Inpatient Diabetes Program Recommendations:   Spoke with patient following surgery now in the PACU. Patient's mother was present at the bedside during conversation with patient. Discussed home insulin regimen which has been most recently Levemir 50 units BID. She did not take any insulin this am prior to surgery. Her mother stated that her blood sugars have been in the 300's at home and very difficult to regulate. Patient has been on insulin for about 5 years. Discussed HgbA1C of 12.2% that was drawn on 02-25-22. Discussed importance of keeping blood sugars under control for better healing and decreasing infection. Patient sees physician at Internal Medicine clinic and has an appointment for followup after discharge.  Patient states that she has been having issues with her health insurance. She had lost her insurance when she lost her job, so being able to afford insulin has been difficult. States that she had a Freestyle Libre CGM and a Dexcom sensor in the past, but could not get insurance coverage most recently. Encouraged her to talk with her physician about getting another one when her insurance is worked out.  Harvel Ricks RN BSN CDE Diabetes Coordinator Pager: [phone]  8am-5pm

## 2022-04-17 ENCOUNTER — Encounter (HOSPITAL_COMMUNITY): Payer: Self-pay | Admitting: Orthopedic Surgery

## 2022-04-18 ENCOUNTER — Telehealth: Payer: Self-pay | Admitting: Orthopedic Surgery

## 2022-04-18 ENCOUNTER — Ambulatory Visit (INDEPENDENT_AMBULATORY_CARE_PROVIDER_SITE_OTHER): Payer: Commercial Managed Care - HMO | Admitting: Orthopedic Surgery

## 2022-04-18 ENCOUNTER — Other Ambulatory Visit: Payer: Self-pay | Admitting: Orthopedic Surgery

## 2022-04-18 ENCOUNTER — Ambulatory Visit: Payer: Managed Care, Other (non HMO) | Admitting: Orthopedic Surgery

## 2022-04-18 DIAGNOSIS — Z89511 Acquired absence of right leg below knee: Secondary | ICD-10-CM

## 2022-04-18 MED ORDER — AMOXICILLIN-POT CLAVULANATE 875-125 MG PO TABS: 1.0000 | ORAL_TABLET | Freq: Two times a day (BID) | ORAL | 0 refills | Status: DC

## 2022-04-18 NOTE — Anesthesia Postprocedure Evaluation (Signed)
Anesthesia Post Note  Patient: Kenyatte Gruber  Procedure(s) Performed: REVISION RIGHT BELOW KNEE AMPUTATION (Right: Knee)     Patient location during evaluation: PACU Anesthesia Type: General and Regional Level of consciousness: awake and alert Pain management: pain level controlled Vital Signs Assessment: post-procedure vital signs reviewed and stable Respiratory status: spontaneous breathing, nonlabored ventilation and respiratory function stable Cardiovascular status: blood pressure returned to baseline and stable Postop Assessment: no apparent nausea or vomiting Anesthetic complications: no  No notable events documented.  Last Vitals:  Vitals:   04/15/22 1315 04/15/22 1342  BP: 130/84 (!) 142/76  Pulse: (!) 109   Resp: 18 15  Temp:  36.7 C  SpO2: 98% 97%    Last Pain:  Vitals:   04/15/22 1315  TempSrc:   PainSc: 4                  Candace Ramus

## 2022-04-18 NOTE — Telephone Encounter (Signed)
Pt informed

## 2022-04-18 NOTE — Telephone Encounter (Signed)
From: Newt Minion, MD  Sent: 04/18/2022   2:54 PM EST  To: Pamella Pert, RMA   Cultures just returned for bacteria from her surgery.  A prescription is called in for Augmentin.  Follow-up in 2 wee

## 2022-04-19 ENCOUNTER — Encounter: Payer: Self-pay | Admitting: Family

## 2022-04-20 LAB — AEROBIC/ANAEROBIC CULTURE W GRAM STAIN (SURGICAL/DEEP WOUND): Gram Stain: NONE SEEN

## 2022-04-21 ENCOUNTER — Ambulatory Visit (INDEPENDENT_AMBULATORY_CARE_PROVIDER_SITE_OTHER): Payer: Commercial Managed Care - HMO

## 2022-04-21 ENCOUNTER — Other Ambulatory Visit: Payer: Self-pay

## 2022-04-21 VITALS — BP 127/78 | HR 104 | Temp 98.1°F | Ht 67.0 in | Wt 254.4 lb

## 2022-04-21 DIAGNOSIS — Z794 Long term (current) use of insulin: Secondary | ICD-10-CM

## 2022-04-21 DIAGNOSIS — Z89512 Acquired absence of left leg below knee: Secondary | ICD-10-CM | POA: Diagnosis not present

## 2022-04-21 DIAGNOSIS — E1169 Type 2 diabetes mellitus with other specified complication: Secondary | ICD-10-CM

## 2022-04-21 MED ORDER — SEMAGLUTIDE(0.25 OR 0.5MG/DOS) 2 MG/3ML ~~LOC~~ SOPN: 0.2500 mg | PEN_INJECTOR | SUBCUTANEOUS | 0 refills | Status: DC

## 2022-04-21 MED ORDER — DAPAGLIFLOZIN PROPANEDIOL 10 MG PO TABS: 10.0000 mg | ORAL_TABLET | Freq: Every day | ORAL | 11 refills | Status: DC

## 2022-04-21 MED ORDER — DEXCOM G7 SENSOR MISC: 0 refills | Status: DC

## 2022-04-21 NOTE — Patient Instructions (Signed)
Lori Schmidt, it was a pleasure seeing you today! You endorsed feeling well today. Below are some of the things we talked about this visit. We look forward to seeing you in the follow up appointment!  Today we discussed: Diabetes: I refilled farxiga and prescribed ozempic. Please let us know if you have any issues with cost. We will change from levermir to another long acting insulin in the future. Check your sugars with the dexcom with each meal and once at night before you sleep.  I have ordered the following labs today:  Lab Orders  No laboratory test(s) ordered today      Referrals ordered today:    Referral Orders         Referral to Nutrition and Diabetes Services      I have ordered the following medication/changed the following medications:   Stop the following medications: Medications Discontinued During This Encounter  Medication Reason   Insulin Glargine-Lixisenatide (SOLIQUA) 100-33 UNT-MCG/ML SOPN Discontinued by provider   dapagliflozin propanediol (FARXIGA) 10 MG TABS tablet Reorder     Start the following medications: Meds ordered this encounter  Medications   dapagliflozin propanediol (FARXIGA) 10 MG TABS tablet    Sig: Take 1 tablet (10 mg total) by mouth daily before breakfast.    Dispense:  30 tablet    Refill:  11   Continuous Blood Gluc Sensor (DEXCOM G7 SENSOR) MISC    Sig: Check your sugars before each meal and once at night before you sleep    Dispense:  1 each    Refill:  0   Semaglutide,0.25 or 0.5MG /DOS, 2 MG/3ML SOPN    Sig: Inject 0.25 mg into the skin once a week.    Dispense:  3 mL    Refill:  0     Follow-up:  4 weeks, A1c and depo    Please make sure to arrive 15 minutes prior to your next appointment. If you arrive late, you may be asked to reschedule.   We look forward to seeing you next time. Please call our clinic at [phone] if you have any questions or concerns. The best time to call is Monday-Friday from 9am-4pm,  but there is someone available 24/7. If after hours or the weekend, call the main hospital number and ask for the Internal Medicine Resident On-Call. If you need medication refills, please notify your pharmacy one week in advance and they will send [place] a request.  Thank you for letting us take part in your care. Wishing you the best!  Thank you, Linus Galas MD

## 2022-04-22 ENCOUNTER — Encounter: Payer: Self-pay | Admitting: Orthopedic Surgery

## 2022-04-22 ENCOUNTER — Encounter: Payer: Self-pay | Admitting: Family

## 2022-04-22 ENCOUNTER — Ambulatory Visit (INDEPENDENT_AMBULATORY_CARE_PROVIDER_SITE_OTHER): Payer: Commercial Managed Care - HMO | Admitting: Family

## 2022-04-22 DIAGNOSIS — T8781 Dehiscence of amputation stump: Secondary | ICD-10-CM

## 2022-04-22 NOTE — Assessment & Plan Note (Addendum)
A1c 12.2 1 month ago. Patient had insurance instability over past few months but has insurance now. Had R BKA revision recently for infection. [name] as well. Her diabetes regimen includes levemir 50 BID, farxiga 10. Metforming was trialed in past and discontinued because she could not tolerate even low dose. She does not want to check blood sugars manually and requests CGM. She is highly motivated and is also informed about diabetes treatment -will try sending ozempic low dose and titrate (Addendum: asked to switch to BYETTA IR 5 mcg BID for insurance coverage) -continue levemir 50mg  daily, will need to switch to lantus in the future due to discontinuation but can continue until she runs out of supply. No data today to make insulin changes, but will likely need short acting insulin in the future as well. - Will continue Farxiga 10. - Met with [name] today and was able to give sample CGM.  Will also send prescription for CGM and fill PA if needed.  Patient can definitely benefit from meeting with [name] in the future as well.

## 2022-04-22 NOTE — Progress Notes (Signed)
Office Visit Note   Patient: Lori Schmidt           Date of Birth: April 23, 1975           MRN: [id number] Visit Date: 04/18/2022              Requested by: Angelique Blonder, DO [address] [place],  [place] [address] PCP: Angelique Blonder, DO  Chief Complaint  Patient presents with   Right Leg - Routine Post Op    04/15/2022 right BKA revision       HPI: Patient is a 47 year old woman is status post revision right below-knee amputation on January 5.  Assessment & Plan: Visit Diagnoses:  1. History of right below knee amputation ([place])     Plan: Patient will continue with Dial soap cleansing 4 x 4 Ace plus her shrinker.  Change the dressing 2 times a day.  Follow-Up Instructions: Return in about 1 week (around 04/25/2022).   Ortho Exam  Patient is alert, oriented, no adenopathy, well-dressed, normal affect, normal respiratory effort. Examination there is some clear serous drainage cultures are negative to date.  Imaging: No results found.   Labs: Lab Results  Component Value Date   HGBA1C 12.2 (A) 02/25/2022   HGBA1C 10.1 (H) 10/18/2021   ESRSEDRATE 15 03/22/2021   ESRSEDRATE 25 (H) 03/15/2021   ESRSEDRATE 67 (H) 02/26/2021   CRP 0.7 03/22/2021   CRP 0.9 03/15/2021   CRP 7.5 (H) 02/26/2021   REPTSTATUS 04/20/2022 FINAL 04/15/2022   GRAMSTAIN NO WBC SEEN NO ORGANISMS SEEN  04/15/2022   CULT  04/15/2022    FEW CORYNEBACTERIUM STRIATUM Standardized susceptibility testing for this organism is not available. RARE STAPHYLOCOCCUS AUREUS NO ANAEROBES ISOLATED Performed at [facility], [place] [address], [place], [place] [address]    LABORGA STAPHYLOCOCCUS AUREUS 04/15/2022     Lab Results  Component Value Date   ALBUMIN 2.1 (L) 03/22/2022   ALBUMIN 3.4 (L) 10/16/2021   ALBUMIN 2.7 (L) 03/22/2021    Lab Results  Component Value Date   MG 1.7 03/24/2022   MG 2.1 02/20/2021   MG 2.0 02/18/2021   No results found for: "VD25OH"  No results found  for: "PREALBUMIN"    Latest Ref Rng & Units 04/15/2022    8:56 AM 03/25/2022    3:24 AM 03/24/2022    4:43 AM  CBC EXTENDED  WBC 4.0 - 10.5 K/uL  4.1  4.3   RBC 3.87 - 5.11 MIL/uL  3.05  3.35   Hemoglobin 12.0 - 15.0 g/dL 10.2  7.7  8.5   HCT 36.0 - 46.0 % 30.0  24.4  26.0   Platelets 150 - 400 K/uL  224  249      There is no height or weight on file to calculate BMI.  Orders:  No orders of the defined types were placed in this encounter.  No orders of the defined types were placed in this encounter.    Procedures: No procedures performed  Clinical Data: No additional findings.  ROS:  All other systems negative, except as noted in the HPI. Review of Systems  Objective: Vital Signs: LMP  (LMP Unknown)   Specialty Comments:  No specialty comments available.  PMFS History: Patient Active Problem List   Diagnosis Date Noted   Dehiscence of amputation stump of right lower extremity ([place]) 04/15/2022   S/P BKA (below knee amputation) unilateral, right ([place]) 03/24/2022   Severe protein-calorie malnutrition ([place]) 03/23/2022   DKA,  type 2 ([place]) 03/23/2022   Subacute osteomyelitis, right ankle and foot ([place]) 03/22/2022   Cellulitis 03/22/2022   Health care maintenance 02/25/2022   Venous insufficiency of right leg 12/01/2021   Leg edema, right 11/17/2021   Encounter for birth control 11/10/2021   Anxiety 10/26/2021   Abscess of skin and subcutaneous tissue 10/16/2021   Dyslipidemia    Hypoalbuminemia due to protein-calorie malnutrition [facility])    Diabetic peripheral neuropathy ([facility])    Acute blood loss anemia    S/P BKA (below knee amputation) unilateral, left ([place]) 03/05/2021   Left below-knee amputee ([place]) 03/05/2021   Adjustment disorder with mixed anxiety and depressed mood    Pressure injury of skin 02/28/2021   Discitis of lumbar region    Diabetes mellitus ([place])    Left leg cellulitis    Morbid obesity with BMI of 40.0-44.9, adult ([place]) 01/05/2012   Past  Medical History:  Diagnosis Date   Hyperlipidemia    Hypertension    Obesity    Type 2 diabetes mellitus ([place])     Family History  Problem Relation Age of Onset   Hypothyroidism Mother    Diabetes Mother    Diabetes Maternal Grandmother    Breast cancer Paternal Grandmother     Past Surgical History:  Procedure Laterality Date   AMPUTATION Left 02/19/2021   Procedure: AMPUTATION BELOW KNEE;  Surgeon: Newt Minion, MD;  Location: [facility];  Service: Orthopedics;  Laterality: Left;   AMPUTATION Right 03/23/2022   Procedure: RIGHT BELOW KNEE AMPUTATION;  Surgeon: Newt Minion, MD;  Location: [facility];  Service: Orthopedics;  Laterality: Right;   BREAST CYST EXCISION Right 05/2018   four cysts removed on right breast/axilla area   STUMP REVISION Right 04/15/2022   Procedure: REVISION RIGHT BELOW KNEE AMPUTATION;  Surgeon: Newt Minion, MD;  Location: [facility];  Service: Orthopedics;  Laterality: Right;   TEE WITHOUT CARDIOVERSION N/A 02/23/2021   Procedure: TRANSESOPHAGEAL ECHOCARDIOGRAM (TEE);  Surgeon: Pixie Casino, MD;  Location: [facility] ENDOSCOPY;  Service: Cardiovascular;  Laterality: N/A;   Social History   Occupational History   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

## 2022-04-22 NOTE — Assessment & Plan Note (Signed)
Recent BKA revision done by Dr. Sharol Given.  Also has left AKA.  Has follow-up arranged with orthopedics.  Denies any infectious symptoms including fevers, chills, leg pain or drainage from wound today.

## 2022-04-22 NOTE — Progress Notes (Signed)
Post-Op Visit Note   Patient: Lori Schmidt           Date of Birth: [birth year]           MRN: [id number] Visit Date: 04/22/2022 PCP: Angelique Blonder, DO  Chief Complaint:  Chief Complaint  Patient presents with   Right Leg - Routine Post Op    03/23/2022 right BKA 04/15/2022 revision right BKA     HPI:  HPI The patient is a 47 year old woman seen status post revision right below-knee amputation January 5.  She is currently on Augmentin per her sensitivities from her cultures.  She is not having any concerns of pain she has been doing dry dressing changes and is now wearing her shrinker.  She does have moderate bloody drainage  No fever no chills Ortho Exam On examination of the right residual limb this is well-approximated sutures appears to be healing well she does have serosanguineous drainage which is mild to moderate.  There is mild erythema no cellulitis  Visit Diagnoses: No diagnosis found.  Plan: Continue daily Dial soap cleansing dry dressings you shrinker around-the-clock elevate work on extension of the knee she will follow-up in 10 days for an incisional check  Follow-Up Instructions: No follow-ups on file.   Imaging: No results found.  Orders:  No orders of the defined types were placed in this encounter.  No orders of the defined types were placed in this encounter.    PMFS History: Patient Active Problem List   Diagnosis Date Noted   Dehiscence of amputation stump of right lower extremity ([place]) 04/15/2022   S/P BKA (below knee amputation) unilateral, right ([place]) 03/24/2022   Severe protein-calorie malnutrition ([place]) 03/23/2022   DKA, type 2 ([place]) 03/23/2022   Subacute osteomyelitis, right ankle and foot ([place]) 03/22/2022   Cellulitis 03/22/2022   Health care maintenance 02/25/2022   Venous insufficiency of right leg 12/01/2021   Leg edema, right 11/17/2021   Encounter for birth control 11/10/2021   Anxiety 10/26/2021   Abscess of skin and  subcutaneous tissue 10/16/2021   Dyslipidemia    Hypoalbuminemia due to protein-calorie malnutrition (HCC)    Diabetic peripheral neuropathy (HCC)    Acute blood loss anemia    S/P BKA (below knee amputation) unilateral, left ([place]) 03/05/2021   Left below-knee amputee ([place]) 03/05/2021   Adjustment disorder with mixed anxiety and depressed mood    Pressure injury of skin 02/28/2021   Discitis of lumbar region    Diabetes mellitus ([place])    Left leg cellulitis    Morbid obesity with BMI of 40.0-44.9, adult ([place]) 01/05/2012   Past Medical History:  Diagnosis Date   Hyperlipidemia    Hypertension    Obesity    Type 2 diabetes mellitus ([place])     Family History  Problem Relation Age of Onset   Hypothyroidism Mother    Diabetes Mother    Diabetes Maternal Grandmother    Breast cancer Paternal Grandmother     Past Surgical History:  Procedure Laterality Date   AMPUTATION Left 02/19/2021   Procedure: AMPUTATION BELOW KNEE;  Surgeon: Newt Minion, MD;  Location: [facility];  Service: Orthopedics;  Laterality: Left;   AMPUTATION Right 03/23/2022   Procedure: RIGHT BELOW KNEE AMPUTATION;  Surgeon: Newt Minion, MD;  Location: [place];  Service: Orthopedics;  Laterality: Right;   BREAST CYST EXCISION Right 05/2018   four cysts removed on right breast/axilla area   STUMP REVISION Right 04/15/2022   Procedure: REVISION  RIGHT BELOW KNEE AMPUTATION;  Surgeon: Newt Minion, MD;  Location: [facility];  Service: Orthopedics;  Laterality: Right;   TEE WITHOUT CARDIOVERSION N/A 02/23/2021   Procedure: TRANSESOPHAGEAL ECHOCARDIOGRAM (TEE);  Surgeon: Pixie Casino, MD;  Location: [facility] ENDOSCOPY;  Service: Cardiovascular;  Laterality: N/A;   Social History   Occupational History   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

## 2022-04-22 NOTE — Progress Notes (Signed)
   CC: Hospital/diabetes follow-up  HPI:  [name] is a 47 y.o.-year-old female with past medical history as below presenting for hospital/diabetes follow-up.  Please see encounters tab for problem-based charting.  Past Medical History:  Diagnosis Date   Hyperlipidemia    Hypertension    Obesity    Type 2 diabetes mellitus ([place])    Review of Systems: As in HPI.  Please see encounters tab for problem based charting.  Physical Exam:  Vitals:   04/21/22 1341  BP: 127/78  Pulse: (!) 104  Temp: 98.1 F (36.7 C)  TempSrc: Oral  SpO2: 100%  Weight: 254 lb 6.4 oz (115.4 kg)  Height: 5\' 7"  (1.702 m)   General:Well-appearing, pleasant, In NAD Cardiac: RRR, no murmurs rubs or gallops. Respiratory: Normal work of breathing on room air, CTAB.  Distant breath sounds Abdominal: Soft, nontender, nondistended Extremities: Right BKA, left AKA  Assessment & Plan:   Diabetes mellitus (HCC) A1c 12.2 1 month ago. Patient had insurance instability over past few months but has insurance now. Had R BKA revision recently for infection. [name] as well. Her diabetes regimen includes levemir 50 BID, farxiga 10. Metforming was trialed in past and discontinued because she could not tolerate even low dose. She does not want to check blood sugars manually and requests CGM. She is highly motivated and is also informed about diabetes treatment -will try sending ozempic low dose and titrate (Addendum: asked to switch to BYETTA IR 5 mcg BID for insurance coverage) -continue levemir 50mg  daily, will need to switch to lantus in the future due to discontinuation but can continue until she runs out of supply. No data today to make insulin changes, but will likely need short acting insulin in the future as well. - Will continue Farxiga 10. - Met with [name] today and was able to give sample CGM.  Will also send prescription for CGM and fill PA if needed.  Patient can definitely benefit from meeting  with [name] in the future as well.  S/P BKA (below knee amputation) unilateral, left (HCC) Recent BKA revision done by Dr. Sharol Given.  Also has left AKA.  Has follow-up arranged with orthopedics.  Denies any infectious symptoms including fevers, chills, leg pain or drainage from wound today.   Patient discussed with Dr. Dareen Piano

## 2022-04-27 NOTE — Progress Notes (Signed)
Internal Medicine Clinic Attending  Case discussed with Dr. Sridharan  At the time of the visit.  We reviewed the resident's history and exam and pertinent patient test results.  I agree with the assessment, diagnosis, and plan of care documented in the resident's note.  

## 2022-04-28 ENCOUNTER — Telehealth: Payer: Self-pay

## 2022-04-28 ENCOUNTER — Other Ambulatory Visit (HOSPITAL_COMMUNITY): Payer: Self-pay

## 2022-04-28 NOTE — Telephone Encounter (Signed)
A Prior Authorization was initiated for this patients Dexcom G7 Sensor through CoverMyMeds.   Key: [facility] Patient Advocate

## 2022-04-29 ENCOUNTER — Ambulatory Visit (INDEPENDENT_AMBULATORY_CARE_PROVIDER_SITE_OTHER): Payer: Commercial Managed Care - HMO | Admitting: Family

## 2022-04-29 ENCOUNTER — Encounter: Payer: Self-pay | Admitting: Family

## 2022-04-29 DIAGNOSIS — T8781 Dehiscence of amputation stump: Secondary | ICD-10-CM

## 2022-04-29 NOTE — Telephone Encounter (Signed)
Prior Auth for patients medication DEXCOM G7 SENSORS approved by [employer] from 04/28/22 to 04/28/23.  CoverMyMeds Key: [id number] [place] Case ID #: [id number]

## 2022-04-29 NOTE — Progress Notes (Signed)
Post-Op Visit Note   Patient: Lori Schmidt           Date of Birth: December 04, 1975           MRN: [id number] Visit Date: 04/29/2022 PCP: Angelique Blonder, DO  Chief Complaint:  Chief Complaint  Patient presents with   Right Leg - Follow-up    HPI:  HPI The patient is a 47 year old woman seen status post right below-knee amputation.  She has been doing dry dressing changes continues to have swelling and some mild serous drainage Ortho Exam On examination of the right residual limb sutures are in place there are 2 areas where the incision has gaped open about 3 mm there is fibrinous tissue in the wound bed there is no probing no tunneling no purulence no erythema or cellulitis  Visit Diagnoses: No diagnosis found.  Plan: Continue daily Dial soap cleansing.  Dry dressings.  Pleased with continued improvement.  Follow-Up Instructions: No follow-ups on file.   Imaging: No results found.  Orders:  No orders of the defined types were placed in this encounter.  No orders of the defined types were placed in this encounter.    PMFS History: Patient Active Problem List   Diagnosis Date Noted   Dehiscence of amputation stump of right lower extremity ([place]) 04/15/2022   S/P BKA (below knee amputation) unilateral, right ([place]) 03/24/2022   Severe protein-calorie malnutrition ([place]) 03/23/2022   DKA, type 2 ([place]) 03/23/2022   Subacute osteomyelitis, right ankle and foot ([place]) 03/22/2022   Cellulitis 03/22/2022   Health care maintenance 02/25/2022   Venous insufficiency of right leg 12/01/2021   Leg edema, right 11/17/2021   Encounter for birth control 11/10/2021   Anxiety 10/26/2021   Abscess of skin and subcutaneous tissue 10/16/2021   Dyslipidemia    Hypoalbuminemia due to protein-calorie malnutrition (HCC)    Diabetic peripheral neuropathy (HCC)    Acute blood loss anemia    S/P BKA (below knee amputation) unilateral, left ([place]) 03/05/2021   Left below-knee amputee ([place])  03/05/2021   Adjustment disorder with mixed anxiety and depressed mood    Pressure injury of skin 02/28/2021   Discitis of lumbar region    Diabetes mellitus ([place])    Left leg cellulitis    Morbid obesity with BMI of 40.0-44.9, adult ([place]) 01/05/2012   Past Medical History:  Diagnosis Date   Hyperlipidemia    Hypertension    Obesity    Type 2 diabetes mellitus ([place])     Family History  Problem Relation Age of Onset   Hypothyroidism Mother    Diabetes Mother    Diabetes Maternal Grandmother    Breast cancer Paternal Grandmother     Past Surgical History:  Procedure Laterality Date   AMPUTATION Left 02/19/2021   Procedure: AMPUTATION BELOW KNEE;  Surgeon: Newt Minion, MD;  Location: [place];  Service: Orthopedics;  Laterality: Left;   AMPUTATION Right 03/23/2022   Procedure: RIGHT BELOW KNEE AMPUTATION;  Surgeon: Newt Minion, MD;  Location: [place] [facility];  Service: Orthopedics;  Laterality: Right;   BREAST CYST EXCISION Right 05/2018   four cysts removed on right breast/axilla area   STUMP REVISION Right 04/15/2022   Procedure: REVISION RIGHT BELOW KNEE AMPUTATION;  Surgeon: Newt Minion, MD;  Location: [facility];  Service: Orthopedics;  Laterality: Right;   TEE WITHOUT CARDIOVERSION N/A 02/23/2021   Procedure: TRANSESOPHAGEAL ECHOCARDIOGRAM (TEE);  Surgeon: Pixie Casino, MD;  Location: [place];  Service: Cardiovascular;  Laterality:  N/A;   Social History   Occupational History   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

## 2022-05-06 ENCOUNTER — Ambulatory Visit (INDEPENDENT_AMBULATORY_CARE_PROVIDER_SITE_OTHER): Payer: Commercial Managed Care - HMO | Admitting: Family

## 2022-05-06 ENCOUNTER — Encounter: Payer: Self-pay | Admitting: Family

## 2022-05-06 DIAGNOSIS — Z89512 Acquired absence of left leg below knee: Secondary | ICD-10-CM

## 2022-05-06 NOTE — Progress Notes (Signed)
If Jardiance was  Post-Op Visit Note   Patient: Lori Schmidt           Date of Birth: Dec 03, 1975           MRN: [id number] Visit Date: 05/06/2022 PCP: Angelique Blonder, DO  Chief Complaint:  Chief Complaint  Patient presents with   Right Leg - Routine Post Op    04/15/2022 revision right BKA     HPI:  HPI The patient is a 47 year old woman who is seen today status post revision right below-knee amputation in January 5 she continues to have some mild erythema and clear drainage.  She is taking Augmentin doing dry dressing changes daily.  She has has much less pain Ortho Exam On examination of the right residual limb sutures are in place she has significant edema with a few areas of deep mild dehiscence.  These are filled in with fibrinous exudative tissue she does have moderate serous drainage there is mild surrounding erythema no cellulitis no warmth  Visit Diagnoses: No diagnosis found.  Plan: Harvested the majority of her sutures.  She will continue with daily Dial soap cleansing dry dressings her shrinker plan to follow-up in 1 week for suture removal  Follow-Up Instructions: No follow-ups on file.   Imaging: No results found.  Orders:  No orders of the defined types were placed in this encounter.  No orders of the defined types were placed in this encounter.    PMFS History: Patient Active Problem List   Diagnosis Date Noted   Dehiscence of amputation stump of right lower extremity ([place]) 04/15/2022   S/P BKA (below knee amputation) unilateral, right ([place]) 03/24/2022   Severe protein-calorie malnutrition ([place]) 03/23/2022   DKA, type 2 ([place]) 03/23/2022   Subacute osteomyelitis, right ankle and foot ([place]) 03/22/2022   Cellulitis 03/22/2022   Health care maintenance 02/25/2022   Venous insufficiency of right leg 12/01/2021   Leg edema, right 11/17/2021   Encounter for birth control 11/10/2021   Anxiety 10/26/2021   Abscess of skin and subcutaneous tissue  10/16/2021   Dyslipidemia    Hypoalbuminemia due to protein-calorie malnutrition (HCC)    Diabetic peripheral neuropathy (HCC)    Acute blood loss anemia    S/P BKA (below knee amputation) unilateral, left ([place]) 03/05/2021   Left below-knee amputee ([place]) 03/05/2021   Adjustment disorder with mixed anxiety and depressed mood    Pressure injury of skin 02/28/2021   Discitis of lumbar region    Diabetes mellitus ([place])    Left leg cellulitis    Morbid obesity with BMI of 40.0-44.9, adult ([place]) 01/05/2012   Past Medical History:  Diagnosis Date   Hyperlipidemia    Hypertension    Obesity    Type 2 diabetes mellitus ([place])     Family History  Problem Relation Age of Onset   Hypothyroidism Mother    Diabetes Mother    Diabetes Maternal Grandmother    Breast cancer Paternal Grandmother     Past Surgical History:  Procedure Laterality Date   AMPUTATION Left 02/19/2021   Procedure: AMPUTATION BELOW KNEE;  Surgeon: Newt Minion, MD;  Location: [place];  Service: Orthopedics;  Laterality: Left;   AMPUTATION Right 03/23/2022   Procedure: RIGHT BELOW KNEE AMPUTATION;  Surgeon: Newt Minion, MD;  Location: [place];  Service: Orthopedics;  Laterality: Right;   BREAST CYST EXCISION Right 05/2018   four cysts removed on right breast/axilla area   STUMP REVISION Right 04/15/2022   Procedure: REVISION  RIGHT BELOW KNEE AMPUTATION;  Surgeon: Newt Minion, MD;  Location: [place];  Service: Orthopedics;  Laterality: Right;   TEE WITHOUT CARDIOVERSION N/A 02/23/2021   Procedure: TRANSESOPHAGEAL ECHOCARDIOGRAM (TEE);  Surgeon: Pixie Casino, MD;  Location: [facility] ENDOSCOPY;  Service: Cardiovascular;  Laterality: N/A;   Social History   Occupational History   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

## 2022-05-12 ENCOUNTER — Encounter (HOSPITAL_COMMUNITY): Payer: Self-pay | Admitting: Orthopedic Surgery

## 2022-05-12 ENCOUNTER — Encounter: Payer: Self-pay | Admitting: Orthopedic Surgery

## 2022-05-12 ENCOUNTER — Ambulatory Visit (INDEPENDENT_AMBULATORY_CARE_PROVIDER_SITE_OTHER): Payer: Commercial Managed Care - HMO | Admitting: Orthopedic Surgery

## 2022-05-12 ENCOUNTER — Other Ambulatory Visit: Payer: Self-pay

## 2022-05-12 DIAGNOSIS — T8781 Dehiscence of amputation stump: Secondary | ICD-10-CM

## 2022-05-12 NOTE — Progress Notes (Signed)
[name] denies chest pain or shortness of breath.  Patient denies having any s/s of Covid in her household, also denies any known exposure to Covid.   [name]'s PCP is Dr. Angelique Blonder. Patient said that he has called some different medications for diabetes, be has not picked them up. [name] is unable to put DexaCom continous blood monitor and she does not have any other blood sugar monitors.I instructed patient to take 50 units of Levemir tonight and 50 units in am.63

## 2022-05-12 NOTE — Progress Notes (Signed)
Office Visit Note   Patient: Lori Schmidt           Date of Birth: 1975-06-25           MRN: [id number] Visit Date: 05/12/2022              Requested by: Angelique Blonder, DO [address] [place],  [place] [address] PCP: Angelique Blonder, DO  Chief Complaint  Patient presents with   Right Leg - Routine Post Op    04/15/2022 right BKA revision kerecis graft       HPI: Patient is a 47 year old woman who presents 1 month status post revision right below-knee amputation.  Patient states she had a fall 2 days ago while trying to get into the shower and fell on the residual limb.  Assessment & Plan: Visit Diagnoses:  1. Dehiscence of amputation stump of right lower extremity (HCC)     Plan: Patient has dehiscence of the wound extending down to bone.  Will plan for revision of the amputation and placement of antibiotic powder within the tibia placement of the Kerecis micro graft.  Follow-Up Instructions: Return in about 1 week (around 05/19/2022).   Ortho Exam  Patient is alert, oriented, no adenopathy, well-dressed, normal affect, normal respiratory effort. Examination patient has no cellulitis no purulent drainage.  She has a wound that is 1 cm diameter and probes 3 cm deep down to bone.  Imaging: No results found.   Labs: Lab Results  Component Value Date   HGBA1C 12.2 (A) 02/25/2022   HGBA1C 10.1 (H) 10/18/2021   ESRSEDRATE 15 03/22/2021   ESRSEDRATE 25 (H) 03/15/2021   ESRSEDRATE 67 (H) 02/26/2021   CRP 0.7 03/22/2021   CRP 0.9 03/15/2021   CRP 7.5 (H) 02/26/2021   REPTSTATUS 04/20/2022 FINAL 04/15/2022   GRAMSTAIN NO WBC SEEN NO ORGANISMS SEEN  04/15/2022   CULT  04/15/2022    FEW CORYNEBACTERIUM STRIATUM Standardized susceptibility testing for this organism is not available. RARE STAPHYLOCOCCUS AUREUS NO ANAEROBES ISOLATED Performed at [facility], [place] [address], [place], [place] [address]    LABORGA STAPHYLOCOCCUS AUREUS 04/15/2022     Lab  Results  Component Value Date   ALBUMIN 2.1 (L) 03/22/2022   ALBUMIN 3.4 (L) 10/16/2021   ALBUMIN 2.7 (L) 03/22/2021    Lab Results  Component Value Date   MG 1.7 03/24/2022   MG 2.1 02/20/2021   MG 2.0 02/18/2021   No results found for: "VD25OH"  No results found for: "PREALBUMIN"    Latest Ref Rng & Units 04/15/2022    8:56 AM 03/25/2022    3:24 AM 03/24/2022    4:43 AM  CBC EXTENDED  WBC 4.0 - 10.5 K/uL  4.1  4.3   RBC 3.87 - 5.11 MIL/uL  3.05  3.35   Hemoglobin 12.0 - 15.0 g/dL 10.2  7.7  8.5   HCT 36.0 - 46.0 % 30.0  24.4  26.0   Platelets 150 - 400 K/uL  224  249      There is no height or weight on file to calculate BMI.  Orders:  No orders of the defined types were placed in this encounter.  No orders of the defined types were placed in this encounter.    Procedures: No procedures performed  Clinical Data: No additional findings.  ROS:  All other systems negative, except as noted in the HPI. Review of Systems  Objective: Vital Signs: LMP  (LMP Unknown)   Specialty  Comments:  No specialty comments available.  PMFS History: Patient Active Problem List   Diagnosis Date Noted   Dehiscence of amputation stump of right lower extremity ([place]) 04/15/2022   S/P BKA (below knee amputation) unilateral, right ([place]) 03/24/2022   Severe protein-calorie malnutrition ([place]) 03/23/2022   DKA, type 2 ([place]) 03/23/2022   Subacute osteomyelitis, right ankle and foot ([place]) 03/22/2022   Cellulitis 03/22/2022   Health care maintenance 02/25/2022   Venous insufficiency of right leg 12/01/2021   Leg edema, right 11/17/2021   Encounter for birth control 11/10/2021   Anxiety 10/26/2021   Abscess of skin and subcutaneous tissue 10/16/2021   Dyslipidemia    Hypoalbuminemia due to protein-calorie malnutrition (HCC)    Diabetic peripheral neuropathy (HCC)    Acute blood loss anemia    S/P BKA (below knee amputation) unilateral, left ([place]) 03/05/2021   Left below-knee  amputee ([place]) 03/05/2021   Adjustment disorder with mixed anxiety and depressed mood    Pressure injury of skin 02/28/2021   Discitis of lumbar region    Diabetes mellitus ([place])    Left leg cellulitis    Morbid obesity with BMI of 40.0-44.9, adult ([place]) 01/05/2012   Past Medical History:  Diagnosis Date   Hyperlipidemia    Hypertension    Obesity    Type 2 diabetes mellitus ([place])     Family History  Problem Relation Age of Onset   Hypothyroidism Mother    Diabetes Mother    Diabetes Maternal Grandmother    Breast cancer Paternal Grandmother     Past Surgical History:  Procedure Laterality Date   AMPUTATION Left 02/19/2021   Procedure: AMPUTATION BELOW KNEE;  Surgeon: Newt Minion, MD;  Location: [facility];  Service: Orthopedics;  Laterality: Left;   AMPUTATION Right 03/23/2022   Procedure: RIGHT BELOW KNEE AMPUTATION;  Surgeon: Newt Minion, MD;  Location: [place];  Service: Orthopedics;  Laterality: Right;   BREAST CYST EXCISION Right 05/2018   four cysts removed on right breast/axilla area   STUMP REVISION Right 04/15/2022   Procedure: REVISION RIGHT BELOW KNEE AMPUTATION;  Surgeon: Newt Minion, MD;  Location: [place];  Service: Orthopedics;  Laterality: Right;   TEE WITHOUT CARDIOVERSION N/A 02/23/2021   Procedure: TRANSESOPHAGEAL ECHOCARDIOGRAM (TEE);  Surgeon: Pixie Casino, MD;  Location: [facility] ENDOSCOPY;  Service: Cardiovascular;  Laterality: N/A;   Social History   Occupational History   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

## 2022-05-13 ENCOUNTER — Encounter: Payer: Commercial Managed Care - HMO | Admitting: Family

## 2022-05-13 ENCOUNTER — Encounter (HOSPITAL_COMMUNITY): Payer: Self-pay | Admitting: Orthopedic Surgery

## 2022-05-13 ENCOUNTER — Encounter (HOSPITAL_COMMUNITY)
Admission: RE | Disposition: A | Discharge status: Home / Self Care | Payer: Self-pay | Source: Home / Self Care | Attending: Orthopedic Surgery

## 2022-05-13 ENCOUNTER — Ambulatory Visit (HOSPITAL_BASED_OUTPATIENT_CLINIC_OR_DEPARTMENT_OTHER): Payer: Commercial Managed Care - HMO | Admitting: Certified Registered"

## 2022-05-13 ENCOUNTER — Other Ambulatory Visit: Payer: Self-pay

## 2022-05-13 ENCOUNTER — Observation Stay (HOSPITAL_COMMUNITY)
Admission: RE | Admit: 2022-05-13 | Discharge: 2022-05-14 | Disposition: A | Discharge status: Home / Self Care | Payer: Commercial Managed Care - HMO | Attending: Orthopedic Surgery | Admitting: Orthopedic Surgery

## 2022-05-13 ENCOUNTER — Ambulatory Visit (HOSPITAL_COMMUNITY): Payer: Commercial Managed Care - HMO | Admitting: Certified Registered"

## 2022-05-13 DIAGNOSIS — E119 Type 2 diabetes mellitus without complications: Secondary | ICD-10-CM | POA: Diagnosis not present

## 2022-05-13 DIAGNOSIS — Z87891 Personal history of nicotine dependence: Secondary | ICD-10-CM | POA: Diagnosis not present

## 2022-05-13 DIAGNOSIS — I10 Essential (primary) hypertension: Secondary | ICD-10-CM | POA: Diagnosis not present

## 2022-05-13 DIAGNOSIS — Y828 Other medical devices associated with adverse incidents: Secondary | ICD-10-CM | POA: Diagnosis not present

## 2022-05-13 DIAGNOSIS — T8781 Dehiscence of amputation stump: Principal | ICD-10-CM | POA: Diagnosis present

## 2022-05-13 DIAGNOSIS — Z794 Long term (current) use of insulin: Secondary | ICD-10-CM

## 2022-05-13 HISTORY — PX: APPLICATION OF WOUND VAC: SHX5189

## 2022-05-13 HISTORY — PX: STUMP REVISION: SHX6102

## 2022-05-13 LAB — GLUCOSE, CAPILLARY
Glucose-Capillary: 246 mg/dL — ABNORMAL HIGH (ref 70–99)
Glucose-Capillary: 229 mg/dL — ABNORMAL HIGH (ref 70–99)
Glucose-Capillary: 198 mg/dL — ABNORMAL HIGH (ref 70–99)
Glucose-Capillary: 421 mg/dL — ABNORMAL HIGH (ref 70–99)

## 2022-05-13 LAB — POCT I-STAT, CHEM 8
BUN: 15 mg/dL (ref 6–20)
Calcium, Ion: 1.19 mmol/L (ref 1.15–1.40)
Chloride: 103 mmol/L (ref 98–111)
Creatinine, Ser: 0.2 mg/dL — ABNORMAL LOW (ref 0.44–1.00)
Glucose, Bld: 261 mg/dL — ABNORMAL HIGH (ref 70–99)
HCT: 39 % (ref 36.0–46.0)
Hemoglobin: 13.3 g/dL (ref 12.0–15.0)
Potassium: 4.2 mmol/L (ref 3.5–5.1)
Sodium: 138 mmol/L (ref 135–145)
TCO2: 22 mmol/L (ref 22–32)

## 2022-05-13 LAB — POCT PREGNANCY, URINE: Preg Test, Ur: NEGATIVE

## 2022-05-13 SURGERY — REVISION, AMPUTATION SITE
Anesthesia: General | Site: Knee | Laterality: Right

## 2022-05-13 MED ORDER — INSULIN ASPART 100 UNIT/ML IJ SOLN
0.0000 [IU] | Freq: Three times a day (TID) | INTRAMUSCULAR | Status: DC
  Administered 2022-05-13: 4 [IU] via SUBCUTANEOUS
  Administered 2022-05-13 – 2022-05-14 (×2): 7 [IU] via SUBCUTANEOUS

## 2022-05-13 MED ORDER — OXYCODONE HCL 5 MG PO TABS
10.0000 mg | ORAL_TABLET | ORAL | Status: DC | PRN
  Administered 2022-05-14: 10 mg via ORAL
  Filled 2022-05-13: qty 2

## 2022-05-13 MED ORDER — CITALOPRAM HYDROBROMIDE 20 MG PO TABS
40.0000 mg | ORAL_TABLET | Freq: Every day | ORAL | Status: DC
  Administered 2022-05-13: 40 mg via ORAL
  Filled 2022-05-13: qty 2

## 2022-05-13 MED ORDER — MIDAZOLAM HCL 2 MG/2ML IJ SOLN
INTRAMUSCULAR | Status: AC
  Filled 2022-05-13: qty 2

## 2022-05-13 MED ORDER — METOPROLOL TARTRATE 5 MG/5ML IV SOLN: 2.0000 mg | INTRAVENOUS | Status: DC | PRN

## 2022-05-13 MED ORDER — FENTANYL CITRATE (PF) 100 MCG/2ML IJ SOLN: 50.0000 ug | Freq: Once | INTRAMUSCULAR | Status: AC

## 2022-05-13 MED ORDER — DAPAGLIFLOZIN PROPANEDIOL 10 MG PO TABS
10.0000 mg | ORAL_TABLET | Freq: Every day | ORAL | Status: DC
  Administered 2022-05-14: 10 mg via ORAL
  Filled 2022-05-13: qty 1

## 2022-05-13 MED ORDER — OXYCODONE HCL 5 MG PO TABS
5.0000 mg | ORAL_TABLET | ORAL | Status: DC | PRN
  Administered 2022-05-14: 10 mg via ORAL
  Filled 2022-05-13: qty 2

## 2022-05-13 MED ORDER — CEFAZOLIN SODIUM-DEXTROSE 2-4 GM/100ML-% IV SOLN
2.0000 g | INTRAVENOUS | Status: AC
  Administered 2022-05-13: 2 g via INTRAVENOUS
  Filled 2022-05-13: qty 100

## 2022-05-13 MED ORDER — AMISULPRIDE (ANTIEMETIC) 5 MG/2ML IV SOLN: 10.0000 mg | Freq: Once | INTRAVENOUS | Status: DC | PRN

## 2022-05-13 MED ORDER — INSULIN ASPART 100 UNIT/ML IJ SOLN
INTRAMUSCULAR | Status: AC
  Filled 2022-05-13: qty 1

## 2022-05-13 MED ORDER — MAGNESIUM SULFATE 2 GM/50ML IV SOLN: 2.0000 g | Freq: Every day | INTRAVENOUS | Status: DC | PRN

## 2022-05-13 MED ORDER — VANCOMYCIN HCL 500 MG IV SOLR
INTRAVENOUS | Status: AC
  Filled 2022-05-13: qty 20

## 2022-05-13 MED ORDER — VITAMIN C 500 MG PO TABS
1000.0000 mg | ORAL_TABLET | Freq: Every day | ORAL | Status: DC
  Administered 2022-05-13 – 2022-05-14 (×2): 1000 mg via ORAL
  Filled 2022-05-13 (×2): qty 2

## 2022-05-13 MED ORDER — BUPIVACAINE LIPOSOME 1.3 % IJ SUSP
INTRAMUSCULAR | Status: DC | PRN
  Administered 2022-05-13 (×2): 5 mL via PERINEURAL

## 2022-05-13 MED ORDER — MIDAZOLAM HCL 2 MG/2ML IJ SOLN
INTRAMUSCULAR | Status: AC
  Administered 2022-05-13: 1 mg via INTRAVENOUS
  Filled 2022-05-13: qty 2

## 2022-05-13 MED ORDER — LOSARTAN POTASSIUM 50 MG PO TABS
25.0000 mg | ORAL_TABLET | Freq: Every day | ORAL | Status: DC
  Administered 2022-05-13: 25 mg via ORAL
  Filled 2022-05-13: qty 1

## 2022-05-13 MED ORDER — BISACODYL 5 MG PO TBEC: 5.0000 mg | DELAYED_RELEASE_TABLET | Freq: Every day | ORAL | Status: DC | PRN

## 2022-05-13 MED ORDER — FENTANYL CITRATE (PF) 100 MCG/2ML IJ SOLN
INTRAMUSCULAR | Status: AC
  Administered 2022-05-13: 50 ug via INTRAVENOUS
  Filled 2022-05-13: qty 2

## 2022-05-13 MED ORDER — BUPIVACAINE HCL (PF) 0.5 % IJ SOLN: INTRAMUSCULAR | Status: DC | PRN

## 2022-05-13 MED ORDER — HYDRALAZINE HCL 20 MG/ML IJ SOLN: 5.0000 mg | INTRAMUSCULAR | Status: DC | PRN

## 2022-05-13 MED ORDER — CEFAZOLIN SODIUM-DEXTROSE 2-4 GM/100ML-% IV SOLN
2.0000 g | Freq: Three times a day (TID) | INTRAVENOUS | Status: AC
  Administered 2022-05-13 – 2022-05-14 (×2): 2 g via INTRAVENOUS
  Filled 2022-05-13 (×2): qty 100

## 2022-05-13 MED ORDER — ONDANSETRON HCL 4 MG/2ML IJ SOLN
INTRAMUSCULAR | Status: AC
  Filled 2022-05-13: qty 4

## 2022-05-13 MED ORDER — HYDROMORPHONE HCL 1 MG/ML IJ SOLN: 0.2500 mg | INTRAMUSCULAR | Status: DC | PRN

## 2022-05-13 MED ORDER — SODIUM CHLORIDE 0.9 % IV SOLN: INTRAVENOUS | Status: DC

## 2022-05-13 MED ORDER — LABETALOL HCL 5 MG/ML IV SOLN: 10.0000 mg | INTRAVENOUS | Status: DC | PRN

## 2022-05-13 MED ORDER — OXYCODONE HCL 5 MG PO TABS: 5.0000 mg | ORAL_TABLET | Freq: Once | ORAL | Status: DC | PRN

## 2022-05-13 MED ORDER — LACTATED RINGERS IV SOLN: INTRAVENOUS | Status: DC

## 2022-05-13 MED ORDER — OXYCODONE HCL 5 MG/5ML PO SOLN: 5.0000 mg | Freq: Once | ORAL | Status: DC | PRN

## 2022-05-13 MED ORDER — FENTANYL CITRATE (PF) 250 MCG/5ML IJ SOLN
INTRAMUSCULAR | Status: AC
  Filled 2022-05-13: qty 5

## 2022-05-13 MED ORDER — ACETAMINOPHEN 325 MG PO TABS: 325.0000 mg | ORAL_TABLET | Freq: Four times a day (QID) | ORAL | Status: DC | PRN

## 2022-05-13 MED ORDER — POLYETHYLENE GLYCOL 3350 17 G PO PACK: 17.0000 g | PACK | Freq: Every day | ORAL | Status: DC | PRN

## 2022-05-13 MED ORDER — ONDANSETRON HCL 4 MG/2ML IJ SOLN: 4.0000 mg | Freq: Four times a day (QID) | INTRAMUSCULAR | Status: DC | PRN

## 2022-05-13 MED ORDER — INSULIN DETEMIR 100 UNIT/ML ~~LOC~~ SOLN
50.0000 [IU] | Freq: Two times a day (BID) | SUBCUTANEOUS | Status: DC
  Administered 2022-05-13 – 2022-05-14 (×2): 50 [IU] via SUBCUTANEOUS
  Filled 2022-05-13 (×3): qty 0.5

## 2022-05-13 MED ORDER — MAGNESIUM CITRATE PO SOLN: 1.0000 | Freq: Once | ORAL | Status: DC | PRN

## 2022-05-13 MED ORDER — 0.9 % SODIUM CHLORIDE (POUR BTL) OPTIME
TOPICAL | Status: DC | PRN
  Administered 2022-05-13: 1000 mL

## 2022-05-13 MED ORDER — GUAIFENESIN-DM 100-10 MG/5ML PO SYRP: 15.0000 mL | ORAL_SOLUTION | ORAL | Status: DC | PRN

## 2022-05-13 MED ORDER — EXENATIDE 5 MCG/0.02ML ~~LOC~~ SOPN: 5.0000 ug | PEN_INJECTOR | Freq: Two times a day (BID) | SUBCUTANEOUS | Status: DC

## 2022-05-13 MED ORDER — ONDANSETRON HCL 4 MG/2ML IJ SOLN
INTRAMUSCULAR | Status: DC | PRN
  Administered 2022-05-13: 4 mg via INTRAVENOUS

## 2022-05-13 MED ORDER — DEXAMETHASONE SODIUM PHOSPHATE 10 MG/ML IJ SOLN
INTRAMUSCULAR | Status: AC
  Filled 2022-05-13: qty 1

## 2022-05-13 MED ORDER — PROPOFOL 10 MG/ML IV BOLUS
INTRAVENOUS | Status: DC | PRN
  Administered 2022-05-13: 160 mg via INTRAVENOUS

## 2022-05-13 MED ORDER — VANCOMYCIN HCL 500 MG IV SOLR
INTRAVENOUS | Status: DC | PRN
  Administered 2022-05-13 (×2): 500 mg

## 2022-05-13 MED ORDER — ORAL CARE MOUTH RINSE: 15.0000 mL | Freq: Once | OROMUCOSAL | Status: AC

## 2022-05-13 MED ORDER — INSULIN ASPART 100 UNIT/ML IJ SOLN
6.0000 [IU] | Freq: Three times a day (TID) | INTRAMUSCULAR | Status: DC
  Administered 2022-05-13 – 2022-05-14 (×2): 6 [IU] via SUBCUTANEOUS

## 2022-05-13 MED ORDER — DOCUSATE SODIUM 100 MG PO CAPS
100.0000 mg | ORAL_CAPSULE | Freq: Every day | ORAL | Status: DC
  Administered 2022-05-14: 100 mg via ORAL
  Filled 2022-05-13: qty 1

## 2022-05-13 MED ORDER — INSULIN ASPART 100 UNIT/ML IJ SOLN
0.0000 [IU] | INTRAMUSCULAR | Status: DC | PRN
  Administered 2022-05-13: 6 [IU] via SUBCUTANEOUS

## 2022-05-13 MED ORDER — PANTOPRAZOLE SODIUM 40 MG PO TBEC
40.0000 mg | DELAYED_RELEASE_TABLET | Freq: Every day | ORAL | Status: DC
  Administered 2022-05-13 – 2022-05-14 (×2): 40 mg via ORAL
  Filled 2022-05-13 (×2): qty 1

## 2022-05-13 MED ORDER — PROPOFOL 500 MG/50ML IV EMUL
INTRAVENOUS | Status: DC | PRN
  Administered 2022-05-13: 35 ug/kg/min via INTRAVENOUS

## 2022-05-13 MED ORDER — JUVEN PO PACK
1.0000 | PACK | Freq: Two times a day (BID) | ORAL | Status: DC
  Filled 2022-05-13: qty 1

## 2022-05-13 MED ORDER — TRANEXAMIC ACID-NACL 1000-0.7 MG/100ML-% IV SOLN
1000.0000 mg | INTRAVENOUS | Status: AC
  Administered 2022-05-13: 1000 mg via INTRAVENOUS
  Filled 2022-05-13: qty 100

## 2022-05-13 MED ORDER — CHLORHEXIDINE GLUCONATE 0.12 % MT SOLN
15.0000 mL | Freq: Once | OROMUCOSAL | Status: AC
  Administered 2022-05-13: 15 mL via OROMUCOSAL
  Filled 2022-05-13: qty 15

## 2022-05-13 MED ORDER — FENTANYL CITRATE (PF) 100 MCG/2ML IJ SOLN
INTRAMUSCULAR | Status: DC | PRN
  Administered 2022-05-13: 50 ug via INTRAVENOUS

## 2022-05-13 MED ORDER — DIPHENHYDRAMINE HCL 50 MG/ML IJ SOLN
INTRAMUSCULAR | Status: AC
  Filled 2022-05-13: qty 1

## 2022-05-13 MED ORDER — PHENOL 1.4 % MT LIQD: 1.0000 | OROMUCOSAL | Status: DC | PRN

## 2022-05-13 MED ORDER — DEXAMETHASONE SODIUM PHOSPHATE 10 MG/ML IJ SOLN
INTRAMUSCULAR | Status: DC | PRN
  Administered 2022-05-13: 5 mg via INTRAVENOUS

## 2022-05-13 MED ORDER — ONDANSETRON HCL 4 MG/2ML IJ SOLN: 4.0000 mg | Freq: Once | INTRAMUSCULAR | Status: DC | PRN

## 2022-05-13 MED ORDER — ALUM & MAG HYDROXIDE-SIMETH 200-200-20 MG/5ML PO SUSP: 15.0000 mL | ORAL | Status: DC | PRN

## 2022-05-13 MED ORDER — POTASSIUM CHLORIDE CRYS ER 20 MEQ PO TBCR: 20.0000 meq | EXTENDED_RELEASE_TABLET | Freq: Every day | ORAL | Status: DC | PRN

## 2022-05-13 MED ORDER — LIDOCAINE HCL (CARDIAC) PF 100 MG/5ML IV SOSY
PREFILLED_SYRINGE | INTRAVENOUS | Status: DC | PRN
  Administered 2022-05-13: 80 mg via INTRAVENOUS

## 2022-05-13 MED ORDER — ZINC SULFATE 220 (50 ZN) MG PO CAPS
220.0000 mg | ORAL_CAPSULE | Freq: Every day | ORAL | Status: DC
  Administered 2022-05-13 – 2022-05-14 (×2): 220 mg via ORAL
  Filled 2022-05-13 (×2): qty 1

## 2022-05-13 MED ORDER — PREGABALIN 100 MG PO CAPS
100.0000 mg | ORAL_CAPSULE | Freq: Two times a day (BID) | ORAL | Status: DC
  Administered 2022-05-13 – 2022-05-14 (×2): 100 mg via ORAL
  Filled 2022-05-13 (×2): qty 1

## 2022-05-13 MED ORDER — PROPOFOL 10 MG/ML IV BOLUS
INTRAVENOUS | Status: AC
  Filled 2022-05-13: qty 20

## 2022-05-13 MED ORDER — BUPIVACAINE HCL (PF) 0.5 % IJ SOLN
INTRAMUSCULAR | Status: DC | PRN
  Administered 2022-05-13 (×2): 15 mL via PERINEURAL

## 2022-05-13 MED ORDER — MIDAZOLAM HCL 2 MG/2ML IJ SOLN: 1.0000 mg | Freq: Once | INTRAMUSCULAR | Status: AC

## 2022-05-13 MED ORDER — HYDROMORPHONE HCL 1 MG/ML IJ SOLN: 0.5000 mg | INTRAMUSCULAR | Status: DC | PRN

## 2022-05-13 SURGICAL SUPPLY — 37 items
BAG COUNTER SPONGE SURGICOUNT (BAG) ×2 IMPLANT
BAG SPNG CNTER NS LX DISP (BAG) ×2
BLADE SAW RECIP 87.9 MT (BLADE) IMPLANT
BLADE SAW RECIPROCATING 77.5 (BLADE) IMPLANT
BLADE SURG 21 STRL SS (BLADE) ×2 IMPLANT
CANISTER WOUND CARE 500ML ATS (WOUND CARE) ×2 IMPLANT
CNTNR URN SCR LID CUP LEK RST (MISCELLANEOUS) IMPLANT
CONT SPEC 4OZ STRL OR WHT (MISCELLANEOUS) ×2
COVER SURGICAL LIGHT HANDLE (MISCELLANEOUS) ×2 IMPLANT
DRAPE DERMATAC (DRAPES) IMPLANT
DRAPE EXTREMITY T 121X128X90 (DISPOSABLE) ×2 IMPLANT
DRAPE HALF SHEET 40X57 (DRAPES) ×2 IMPLANT
DRAPE INCISE IOBAN 66X45 STRL (DRAPES) ×2 IMPLANT
DRAPE U-SHAPE 47X51 STRL (DRAPES) ×4 IMPLANT
DRESSING PREVENA PLUS CUSTOM (GAUZE/BANDAGES/DRESSINGS) ×2 IMPLANT
DRSG PREVENA PLUS CUSTOM (GAUZE/BANDAGES/DRESSINGS) ×2
DURAPREP 26ML APPLICATOR (WOUND CARE) ×2 IMPLANT
ELECT REM PT RETURN 9FT ADLT (ELECTROSURGICAL) ×2
ELECTRODE REM PT RTRN 9FT ADLT (ELECTROSURGICAL) ×2 IMPLANT
GLOVE BIO SURGEON STRL SZ7 (GLOVE) IMPLANT
GLOVE BIOGEL PI IND STRL 9 (GLOVE) ×2 IMPLANT
GLOVE SURG ORTHO 9.0 STRL STRW (GLOVE) ×2 IMPLANT
GOWN STRL REUS W/ TWL XL LVL3 (GOWN DISPOSABLE) ×4 IMPLANT
GOWN STRL REUS W/TWL XL LVL3 (GOWN DISPOSABLE) ×4
GRAFT SKIN WND MICRO 38 (Tissue) IMPLANT
KIT BASIN OR (CUSTOM PROCEDURE TRAY) ×2 IMPLANT
KIT TURNOVER KIT B (KITS) ×2 IMPLANT
MANIFOLD NEPTUNE II (INSTRUMENTS) ×2 IMPLANT
NS IRRIG 1000ML POUR BTL (IV SOLUTION) ×2 IMPLANT
PACK GENERAL/GYN (CUSTOM PROCEDURE TRAY) ×2 IMPLANT
PAD ARMBOARD 7.5X6 YLW CONV (MISCELLANEOUS) ×2 IMPLANT
PREVENA RESTOR ARTHOFORM 46X30 (CANNISTER) ×2 IMPLANT
STAPLER VISISTAT 35W (STAPLE) IMPLANT
SUT ETHILON 2 0 PSLX (SUTURE) ×4 IMPLANT
SUT SILK 2 0 (SUTURE)
SUT SILK 2-0 18XBRD TIE 12 (SUTURE) IMPLANT
TOWEL GREEN STERILE (TOWEL DISPOSABLE) ×2 IMPLANT

## 2022-05-13 NOTE — H&P (Signed)
Lori Schmidt is an 47 y.o. female.   Chief Complaint: Traumatic dehiscence right below-knee amputation HPI: Patient is a 47 year old woman who presents 1 month status post revision right below-knee amputation. Patient states she had a fall 2 days ago while trying to get into the shower and fell on the residual limb.   Past Medical History:  Diagnosis Date   Hyperlipidemia    Hypertension    Obesity    Type 2 diabetes mellitus ([place])     Past Surgical History:  Procedure Laterality Date   AMPUTATION Left 02/19/2021   Procedure: AMPUTATION BELOW KNEE;  Surgeon: Newt Minion, MD;  Location: [place];  Service: Orthopedics;  Laterality: Left;   AMPUTATION Right 03/23/2022   Procedure: RIGHT BELOW KNEE AMPUTATION;  Surgeon: Newt Minion, MD;  Location: [place];  Service: Orthopedics;  Laterality: Right;   BREAST CYST EXCISION Right 05/2018   four cysts removed on right breast/axilla area   STUMP REVISION Right 04/15/2022   Procedure: REVISION RIGHT BELOW KNEE AMPUTATION;  Surgeon: Newt Minion, MD;  Location: [place];  Service: Orthopedics;  Laterality: Right;   TEE WITHOUT CARDIOVERSION N/A 02/23/2021   Procedure: TRANSESOPHAGEAL ECHOCARDIOGRAM (TEE);  Surgeon: Pixie Casino, MD;  Location: [facility] ENDOSCOPY;  Service: Cardiovascular;  Laterality: N/A;    Family History  Problem Relation Age of Onset   Hypothyroidism Mother    Diabetes Mother    Diabetes Maternal Grandmother    Breast cancer Paternal Grandmother    Social History:  reports that she quit smoking about 11 years ago. Her smoking use included cigarettes. She has never used smokeless tobacco. She reports that she does not drink alcohol and does not use drugs.  Allergies:  Allergies  Allergen Reactions   Lisinopril Cough   Sulfa Antibiotics Other (See Comments)    Makes sick    No medications prior to admission.    No results found for this or any previous visit (from the past 48 hour(s)). No results  found.  Review of Systems  All other systems reviewed and are negative.   Height 5\' 7"  (1.702 m), weight 113.4 kg. Physical Exam   Patient is alert, oriented, no adenopathy, well-dressed, normal affect, normal respiratory effort. Examination patient has no cellulitis no purulent drainage.  She has a wound that is 1 cm diameter and probes 3 cm deep down to bone.  Assessment/Plan 1. Dehiscence of amputation stump of right lower extremity (HCC)       Plan: Patient has dehiscence of the wound extending down to bone.  Will plan for revision of the amputation and placement of antibiotic powder within the tibia placement of the Kerecis micro graft.  Newt Minion, MD 05/13/2022, 6:41 AM

## 2022-05-13 NOTE — Anesthesia Procedure Notes (Signed)
Anesthesia Regional Block: Femoral nerve block   Pre-Anesthetic Checklist: , timeout performed,  Correct Patient, Correct Site, Correct Laterality,  Correct Procedure, Correct Position, site marked,  Risks and benefits discussed,  Surgical consent,  Pre-op evaluation,  At surgeon's request and post-op pain management  Laterality: Right  Prep: chloraprep       Needles:  Injection technique: Single-shot  Needle Type: Echogenic Stimulator Needle     Needle Length: 10cm  Needle Gauge: 21   Needle insertion depth: 8 cm   Additional Needles:   Procedures:,,,, ultrasound used (permanent image in chart),,    Narrative:  Start time: 05/13/2022 10:05 AM End time: 05/13/2022 10:10 AM Injection made incrementally with aspirations every 5 mL.  Performed by: Personally  Anesthesiologist: Josephine Igo, MD  Additional Notes: Timeout performed. Patient sedated. Relevant anatomy ID'd using [place]. Incremental 2-61ml injection of LA with frequent aspiration. Patient tolerated procedure well.

## 2022-05-13 NOTE — Anesthesia Postprocedure Evaluation (Signed)
Anesthesia Post Note  Patient: Lori Schmidt  Procedure(s) Performed: RIGHT BELOW THE KNEE AMPUTATION REVISION (Right) APPLICATION OF WOUND VAC (Right: Knee)     Patient location during evaluation: PACU Anesthesia Type: General Level of consciousness: awake and alert and oriented Pain management: pain level controlled Vital Signs Assessment: post-procedure vital signs reviewed and stable Respiratory status: spontaneous breathing, nonlabored ventilation and respiratory function stable Cardiovascular status: blood pressure returned to baseline and stable Postop Assessment: no apparent nausea or vomiting Anesthetic complications: no   No notable events documented.  Last Vitals:  Vitals:   05/13/22 1202 05/13/22 1215  BP: 123/84 120/84  Pulse: (!) 192 93  Resp:  15  Temp: 36.8 C   SpO2: 96% 95%    Last Pain:  Vitals:   05/13/22 1202  TempSrc:   PainSc: 0-No pain                 Polette Nofsinger A.

## 2022-05-13 NOTE — Anesthesia Procedure Notes (Signed)
Procedure Name: LMA Insertion Date/Time: 05/13/2022 11:26 AM  Performed by: Gwyndolyn Saxon, CRNAPre-anesthesia Checklist: Patient identified, Emergency Drugs available, Suction available and Patient being monitored Patient Re-evaluated:Patient Re-evaluated prior to induction Oxygen Delivery Method: Circle system utilized Preoxygenation: Pre-oxygenation with 100% oxygen Induction Type: IV induction Ventilation: Mask ventilation without difficulty LMA: LMA inserted LMA Size: 4.0 Number of attempts: 1 Placement Confirmation: positive ETCO2 and breath sounds checked- equal and bilateral Tube secured with: Tape Dental Injury: Teeth and Oropharynx as per pre-operative assessment

## 2022-05-13 NOTE — Transfer of Care (Signed)
Immediate Anesthesia Transfer of Care Note  Patient: Lori Schmidt  Procedure(s) Performed: RIGHT BELOW THE KNEE AMPUTATION REVISION (Right) APPLICATION OF WOUND VAC (Right: Knee)  Patient Location: PACU  Anesthesia Type:GA combined with regional for post-op pain  Level of Consciousness: awake and patient cooperative  Airway & Oxygen Therapy: Patient Spontanous Breathing and Patient connected to nasal cannula oxygen  Post-op Assessment: Report given to RN and Post -op Vital signs reviewed and stable  Post vital signs: Reviewed and stable  Last Vitals:  Vitals Value Taken Time  BP 123/84 05/13/22 1202  Temp    Pulse 93 05/13/22 1205  Resp 15 05/13/22 1205  SpO2 95 % 05/13/22 1205  Vitals shown include unvalidated device data.  Last Pain:  Vitals:   05/13/22 0902  TempSrc:   PainSc: 0-No pain         Complications: No notable events documented.

## 2022-05-13 NOTE — Anesthesia Preprocedure Evaluation (Addendum)
Anesthesia Evaluation  Patient identified by MRN, date of birth, ID band Patient awake    Reviewed: Allergy & Precautions, NPO status , Patient's Chart, lab work & pertinent test results, reviewed documented beta blocker date and time   History of Anesthesia Complications Negative for: history of anesthetic complications  Airway Mallampati: II  TM Distance: >3 FB Neck ROM: Full    Dental  (+) Dental Advisory Given, Teeth Intact   Pulmonary neg shortness of breath, neg sleep apnea, neg COPD, neg recent URI, former smoker   Pulmonary exam normal breath sounds clear to auscultation       Cardiovascular hypertension, Pt. on medications (-) angina (-) Past MI and (-) CHF Normal cardiovascular exam Rhythm:Regular Rate:Normal    1. Left ventricular ejection fraction, by estimation, is 60 to 65%. The  left ventricle has normal function. There is mild left ventricular  hypertrophy.   2. Right ventricular systolic function is normal. The right ventricular  size is normal.   3. No left atrial/left atrial appendage thrombus was detected.   4. The mitral valve is abnormal. Trivial mitral valve regurgitation.  There is mild late systolic prolapse of the middle scallop of the  posterior leaflet of the mitral valve.   5. The aortic valve is tricuspid. Aortic valve regurgitation is not  visualized.     Neuro/Psych  PSYCHIATRIC DISORDERS Anxiety      Neuromuscular disease    GI/Hepatic negative GI ROS, Neg liver ROS,,,  Endo/Other  diabetes, Well Controlled, Type 2, Insulin Dependent  Morbid obesityLab Results      Component                Value               Date                      HGBA1C                   12.2 (A)            02/25/2022             Renal/GU Lab Results      Component                Value               Date                      CREATININE               0.20 (L)            04/15/2022             negative  genitourinary   Musculoskeletal  (+) Arthritis , Osteoarthritis,  Dehiscence right BKA incision    Abdominal  (+) + obese  Peds  Hematology  (+) Blood dyscrasia, anemia Lab Results      Component                Value               Date                      WBC                      4.1  03/25/2022                HGB                      10.2 (L)            04/15/2022                HCT                      30.0 (L)            04/15/2022                MCV                      80.0                03/25/2022                PLT                      224                 03/25/2022              Anesthesia Other Findings   Reproductive/Obstetrics                              Anesthesia Physical Anesthesia Plan  ASA: 4  Anesthesia Plan: General   Post-op Pain Management:    Induction: Intravenous  PONV Risk Score and Plan: 3 and Ondansetron, Midazolam and Treatment may vary due to age or medical condition  Airway Management Planned: LMA  Additional Equipment: None  Intra-op Plan:   Post-operative Plan: Extubation in OR  Informed Consent: I have reviewed the patients History and Physical, chart, labs and discussed the procedure including the risks, benefits and alternatives for the proposed anesthesia with the patient or authorized representative who has indicated his/her understanding and acceptance.     Dental advisory given  Plan Discussed with: CRNA  Anesthesia Plan Comments:         Anesthesia Quick Evaluation

## 2022-05-13 NOTE — Interval H&P Note (Signed)
History and Physical Interval Note:  05/13/2022 11:14 AM  Lori Schmidt [name]  has presented today for surgery, with the diagnosis of DEHISCENCE RIGHT BELOW THE KNEE AMPUTATION.  The various methods of treatment have been discussed with the patient and family. After consideration of risks, benefits and other options for treatment, the patient has consented to  Procedure(s): [place] (Right) as a surgical intervention.  The patient's history has been reviewed, patient examined, no change in status, stable for surgery.  I have reviewed the patient's chart and labs.  Questions were answered to the patient's satisfaction.     Newt Minion

## 2022-05-13 NOTE — Op Note (Signed)
05/13/2022  12:00 PM  PATIENT:  Lori Schmidt    PRE-OPERATIVE DIAGNOSIS:  DEHISCENCE RIGHT BELOW THE KNEE AMPUTATION  POST-OPERATIVE DIAGNOSIS:  Same  PROCEDURE:  RIGHT BELOW THE KNEE AMPUTATION REVISION, APPLICATION OF WOUND VAC  SURGEON:  Newt Minion, MD  PHYSICIAN ASSISTANT:None ANESTHESIA:   General  PREOPERATIVE INDICATIONS:  [name] is a  47 y.o. female with a diagnosis of DEHISCENCE RIGHT BELOW THE KNEE AMPUTATION who failed conservative measures and elected for surgical management.    The risks benefits and alternatives were discussed with the patient preoperatively including but not limited to the risks of infection, bleeding, nerve injury, cardiopulmonary complications, the need for revision surgery, among others, and the patient was willing to proceed.  OPERATIVE IMPLANTS: Vancomycin powder 500 mg. Kerecis micro graft 38 cm.  @ENCIMAGES @  OPERATIVE FINDINGS: Deep soft tissue was sent for cultures.  Tissue margins were healthy viable and clear.  OPERATIVE PROCEDURE: Patient brought the operating room and underwent a general anesthetic.  After adequate levels anesthesia were obtained patient's right lower extremity was prepped using DuraPrep draped into a sterile field a timeout was called.  A fishmouth incision was made around the ulcerative tissue from where patient had wound dehiscence from her fall.  This was carried sharply down to bone and the distal centimeter of the tibia and fibula were resected beveled anteriorly.  The tissue margins were clear after excision of skin and soft tissue muscle and fascia.  The bone and deep wound was covered with Augmentin milligrams of vancomycin powder.  This was then covered with 38 cm of Kerecis micro graft.  The wound was closed using 2-0 nylon a Prevena customizable wound VAC was applied this had a good suction fit patient was extubated taken to PACU in stable condition.   DISCHARGE PLANNING:  Antibiotic  duration: Overnight antibiotics  Weightbearing: Weightbearing as tolerated on the left lower extremity  Pain medication: Opioid pathway  Dressing care/ Wound VAC: Wound VAC on the right  Ambulatory devices: Transfers to a wheelchair  Discharge to: Anticipate discharge to home tomorrow.  Follow-up: In the office 1 week post operative.

## 2022-05-13 NOTE — Anesthesia Procedure Notes (Signed)
Anesthesia Regional Block: Popliteal block   Pre-Anesthetic Checklist: , timeout performed,  Correct Patient, Correct Site, Correct Laterality,  Correct Procedure, Correct Position, site marked,  Risks and benefits discussed,  Surgical consent,  Pre-op evaluation,  At surgeon's request and post-op pain management  Laterality: Right  Prep: chloraprep       Needles:  Injection technique: Single-shot  Needle Type: Echogenic Stimulator Needle     Needle Length: 10cm  Needle Gauge: 21   Needle insertion depth: 7 cm   Additional Needles:   Procedures:,,,, ultrasound used (permanent image in chart),,    Narrative:  Start time: 05/13/2022 10:00 AM End time: 05/13/2022 10:05 AM Injection made incrementally with aspirations every 5 mL.  Performed by: Personally  Anesthesiologist: Josephine Igo, MD  Additional Notes: Timeout performed. Patient sedated. Relevant anatomy ID'd using [place]. Incremental 2-30ml injection of LA with frequent aspiration. Patient tolerated procedure well.

## 2022-05-14 ENCOUNTER — Encounter (HOSPITAL_COMMUNITY): Payer: Self-pay | Admitting: Orthopedic Surgery

## 2022-05-14 DIAGNOSIS — T8781 Dehiscence of amputation stump: Secondary | ICD-10-CM | POA: Diagnosis not present

## 2022-05-14 LAB — GLUCOSE, CAPILLARY: Glucose-Capillary: 216 mg/dL — ABNORMAL HIGH (ref 70–99)

## 2022-05-14 MED ORDER — PREGABALIN 100 MG PO CAPS: 100.0000 mg | ORAL_CAPSULE | Freq: Three times a day (TID) | ORAL | 2 refills | Status: DC

## 2022-05-14 NOTE — Discharge Summary (Signed)
Discharge Diagnoses:  Principal Problem:   Dehiscence of amputation stump ([place])   Surgeries: Procedure(s): RIGHT BELOW THE KNEE AMPUTATION REVISION APPLICATION OF WOUND VAC on 05/13/2022    Consultants:   Discharged Condition: Improved  Hospital Course: Lori Schmidt is an 47 y.o. female who was admitted 05/13/2022 with a chief complaint of dehiscence right below-knee amputation., with a final diagnosis of DEHISCENCE RIGHT BELOW THE KNEE AMPUTATION.  Patient was brought to the operating room on 05/13/2022 and underwent Procedure(s): RIGHT BELOW THE KNEE AMPUTATION REVISION APPLICATION OF WOUND VAC.    Patient was given perioperative antibiotics:  Anti-infectives (From admission, onward)    Start     Dose/Rate Route Frequency Ordered Stop   05/13/22 1900  ceFAZolin (ANCEF) IVPB 2g/100 mL premix        2 g 200 mL/hr over 30 Minutes Intravenous Every 8 hours 05/13/22 1457 05/14/22 0404   05/13/22 1152  vancomycin (VANCOCIN) powder  Status:  Discontinued          As needed 05/13/22 1152 05/13/22 1201   05/13/22 0845  ceFAZolin (ANCEF) IVPB 2g/100 mL premix        2 g 200 mL/hr over 30 Minutes Intravenous On call to O.R. 05/13/22 0840 05/13/22 1147     .  Patient was given sequential compression devices, early ambulation, and aspirin for DVT prophylaxis.  Recent vital signs: Patient Vitals for the past 24 hrs:  BP Temp Temp src Pulse Resp SpO2  05/14/22 0734 113/65 98.2 F (36.8 C) Oral 100 17 98 %  05/14/22 0400 114/70 -- -- 92 20 97 %  05/13/22 1940 128/71 99.2 F (37.3 C) Oral (!) 101 20 99 %  05/13/22 1456 116/73 98.2 F (36.8 C) Oral 91 17 99 %  05/13/22 1430 108/73 98.4 F (36.9 C) -- 91 13 97 %  05/13/22 1400 114/74 -- -- 89 15 96 %  05/13/22 1330 114/75 -- -- 88 14 95 %  05/13/22 1300 124/79 98.5 F (36.9 C) -- 88 12 97 %  05/13/22 1245 113/75 -- -- 89 14 95 %  05/13/22 1230 (!) 120/92 -- -- 90 12 95 %  05/13/22 1215 120/84 -- -- 93 15 95 %  05/13/22 1202  123/84 98.2 F (36.8 C) -- (!) 192 -- 96 %  .  Recent laboratory studies: No results found.  Discharge Medications:   Allergies as of 05/14/2022       Reactions   Lisinopril Cough   Sulfa Antibiotics Other (See Comments)   Makes sick        Medication List     TAKE these medications    acetaminophen 325 MG tablet Commonly known as: TYLENOL Take 1-2 tablets (325-650 mg total) by mouth every 6 (six) hours as needed for mild pain (pain score 1-3 or temp > 100.5).   amoxicillin-clavulanate 875-125 MG tablet Commonly known as: AUGMENTIN Take 1 tablet by mouth 2 (two) times daily. What changed: when to take this   Byetta 5 MCG Pen 5 MCG/0.02ML Sopn injection Generic drug: exenatide Inject 5 mcg into the skin 2 (two) times daily with a meal.   citalopram 40 MG tablet Commonly known as: CELEXA Take 1 tablet (40 mg total) by mouth at bedtime.   dapagliflozin propanediol 10 MG Tabs tablet Commonly known as: FARXIGA Take 1 tablet (10 mg total) by mouth daily before breakfast.   Dexcom G7 Sensor Misc Check your sugars before each meal and once at night before you sleep  hydrOXYzine 10 MG tablet Commonly known as: ATARAX Take 1 tablet (10 mg total) by mouth 3 (three) times daily as needed.   insulin detemir 100 UNIT/ML injection Commonly known as: LEVEMIR Inject 100 Units into the skin 2 (two) times daily.   losartan 25 MG tablet Commonly known as: COZAAR Take 1 tablet (25 mg total) by mouth at bedtime.   Oxycodone HCl 10 MG Tabs Take 1 tablet (10 mg total) by mouth every 4 (four) hours as needed for severe pain.   Pentips 32G X 4 MM Misc Generic drug: Insulin Pen Needle Use as directed with Soliqua   pravastatin 40 MG tablet Commonly known as: PRAVACHOL Take 1 tablet (40 mg total) by mouth daily. What changed:  how much to take when to take this   pregabalin 100 MG capsule Commonly known as: Lyrica Take 1 capsule (100 mg total) by mouth 3 (three) times  daily. What changed:  how much to take how to take this when to take this additional instructions        Diagnostic Studies: No results found.  Patient benefited maximally from their hospital stay and there were no complications.     Disposition: Discharge disposition: 01-Home or Self Care      Discharge Instructions     Call MD / Call 911   Complete by: As directed    If you experience chest pain or shortness of breath, CALL 911 and be transported to the hospital emergency room.  If you develope a fever above 101 F, pus (white drainage) or increased drainage or redness at the wound, or calf pain, call your surgeon's office.   Constipation Prevention   Complete by: As directed    Drink plenty of fluids.  Prune juice may be helpful.  You may use a stool softener, such as Colace (over the counter) 100 mg twice a day.  Use MiraLax (over the counter) for constipation as needed.   Diet - low sodium heart healthy   Complete by: As directed    Increase activity slowly as tolerated   Complete by: As directed    Negative Pressure Wound Therapy - Incisional   Complete by: As directed    Connect her wound VAC dressing to a Praveena plus portable wound VAC pump for discharge.   Post-operative opioid taper instructions:   Complete by: As directed    POST-OPERATIVE OPIOID TAPER INSTRUCTIONS: It is important to wean off of your opioid medication as soon as possible. If you do not need pain medication after your surgery it is ok to stop day one. Opioids include: Codeine, Hydrocodone(Norco, Vicodin), Oxycodone(Percocet, oxycontin) and hydromorphone amongst others.  Long term and even short term use of opiods can cause: Increased pain response Dependence Constipation Depression Respiratory depression And more.  Withdrawal symptoms can include Flu like symptoms Nausea, vomiting And more Techniques to manage these symptoms Hydrate well Eat regular healthy meals Stay active Use  relaxation techniques(deep breathing, meditating, yoga) Do Not substitute Alcohol to help with tapering If you have been on opioids for less than two weeks and do not have pain than it is ok to stop all together.  Plan to wean off of opioids This plan should start within one week post op of your joint replacement. Maintain the same interval or time between taking each dose and first decrease the dose.  Cut the total daily intake of opioids by one tablet each day Next start to increase the time between doses. The last dose  that should be eliminated is the evening dose.          Follow-up Information     Newt Minion, MD Follow up in 1 week(s).   Specialty: Orthopedic Surgery Contact information: [address] [place] [address] [phone]                  Signed: Newt Minion 05/14/2022, 11:32 AM

## 2022-05-14 NOTE — Plan of Care (Signed)
  Problem: Health Behavior/Discharge Planning: Goal: Ability to identify and utilize available resources and services will improve Outcome: Adequate for Discharge Goal: Ability to manage health-related needs will improve Outcome: Adequate for Discharge

## 2022-05-14 NOTE — Progress Notes (Signed)
OT Cancellation Note  Patient Details Name: Edla Para MRN: [id number] DOB: [birth year]   Cancelled Treatment:    Reason Eval/Treat Not Completed: OT screened, no needs identified, will sign off. Pt reports she has been transferring to [facility] with no difficulty since her revision yesterday. Pt politely declined OT evaluation. Pt reports she plans to resume her current rehab therapy once she is home. Will sign off.   Tyrone Schimke, OT Acute Rehabilitation Services Office: [phone]   Hortencia Pilar 05/14/2022, 9:18 AM

## 2022-05-14 NOTE — Progress Notes (Signed)
Provena wound vac connected for home use. Patient  already familiar with it. Decline education.

## 2022-05-14 NOTE — Progress Notes (Signed)
Nsg Discharge Note  Admit Date:  05/13/2022 Discharge date: 05/14/2022   Lori Schmidt to be D/C'd Home per MD order.  AVS completed. Patient/caregiver able to verbalize understanding.  Discharge Medication: Allergies as of 05/14/2022       Reactions   Lisinopril Cough   Sulfa Antibiotics Other (See Comments)   Makes sick        Medication List     TAKE these medications    acetaminophen 325 MG tablet Commonly known as: TYLENOL Take 1-2 tablets (325-650 mg total) by mouth every 6 (six) hours as needed for mild pain (pain score 1-3 or temp > 100.5).   amoxicillin-clavulanate 875-125 MG tablet Commonly known as: AUGMENTIN Take 1 tablet by mouth 2 (two) times daily. What changed: when to take this   Byetta 5 MCG Pen 5 MCG/0.02ML Sopn injection Generic drug: exenatide Inject 5 mcg into the skin 2 (two) times daily with a meal.   citalopram 40 MG tablet Commonly known as: CELEXA Take 1 tablet (40 mg total) by mouth at bedtime.   dapagliflozin propanediol 10 MG Tabs tablet Commonly known as: FARXIGA Take 1 tablet (10 mg total) by mouth daily before breakfast.   Dexcom G7 Sensor Misc Check your sugars before each meal and once at night before you sleep   hydrOXYzine 10 MG tablet Commonly known as: ATARAX Take 1 tablet (10 mg total) by mouth 3 (three) times daily as needed.   insulin detemir 100 UNIT/ML injection Commonly known as: LEVEMIR Inject 100 Units into the skin 2 (two) times daily.   losartan 25 MG tablet Commonly known as: COZAAR Take 1 tablet (25 mg total) by mouth at bedtime.   Oxycodone HCl 10 MG Tabs Take 1 tablet (10 mg total) by mouth every 4 (four) hours as needed for severe pain.   Pentips 32G X 4 MM Misc Generic drug: Insulin Pen Needle Use as directed with Soliqua   pravastatin 40 MG tablet Commonly known as: PRAVACHOL Take 1 tablet (40 mg total) by mouth daily. What changed:  how much to take when to take this   pregabalin 100 MG  capsule Commonly known as: Lyrica Take 1 capsule (100 mg total) by mouth 3 (three) times daily. What changed:  how much to take how to take this when to take this additional instructions        Discharge Assessment: Vitals:   05/14/22 0400 05/14/22 0734  BP: 114/70 113/65  Pulse: 92 100  Resp: 20 17  Temp:  98.2 F (36.8 C)  SpO2: 97% 98%   Skin clean, dry and intact without evidence of skin break down, no evidence of skin tears noted. IV catheter discontinued intact. Site without signs and symptoms of complications - no redness or edema noted at insertion site, patient denies c/o pain - only slight tenderness at site.  Dressing with slight pressure applied.  D/c Instructions-Education: Discharge instructions given to patient/family with verbalized understanding. D/c education completed with patient/family including follow up instructions, medication list, d/c activities limitations if indicated, with other d/c instructions as indicated by MD - patient able to verbalize understanding, all questions fully answered. Patient instructed to return to ED, call 911, or call MD for any changes in condition.  Patient escorted via [place], and D/C home via private auto.  Atilano Ina, RN 05/14/2022 12:18 PM

## 2022-05-14 NOTE — Progress Notes (Signed)
Patient ID: Lori Schmidt, female   DOB: Sep 07, 1975, 47 y.o.   MRN: [id number] Patient is postoperative day 1 revision transtibial amputation on the right.  There is 150 cc in the wound VAC canister.  Patient feels comfortable and would like to discharge to home today.  She states she does have pain medicine we will refill her Lyrica.  Patient will need to discharge on the Praveena plus portable wound VAC pump.

## 2022-05-16 ENCOUNTER — Ambulatory Visit (INDEPENDENT_AMBULATORY_CARE_PROVIDER_SITE_OTHER): Payer: Commercial Managed Care - HMO | Admitting: Orthopedic Surgery

## 2022-05-16 ENCOUNTER — Telehealth: Payer: Self-pay

## 2022-05-16 ENCOUNTER — Telehealth: Payer: Self-pay | Admitting: Orthopedic Surgery

## 2022-05-16 ENCOUNTER — Encounter: Payer: Commercial Managed Care - HMO | Admitting: Orthopedic Surgery

## 2022-05-16 ENCOUNTER — Encounter: Payer: Self-pay | Admitting: Orthopedic Surgery

## 2022-05-16 ENCOUNTER — Other Ambulatory Visit: Payer: Self-pay | Admitting: Orthopedic Surgery

## 2022-05-16 DIAGNOSIS — Z89512 Acquired absence of left leg below knee: Secondary | ICD-10-CM

## 2022-05-16 DIAGNOSIS — T8781 Dehiscence of amputation stump: Secondary | ICD-10-CM

## 2022-05-16 DIAGNOSIS — Z89511 Acquired absence of right leg below knee: Secondary | ICD-10-CM

## 2022-05-16 MED ORDER — CIPROFLOXACIN HCL 500 MG PO TABS: 500.0000 mg | ORAL_TABLET | Freq: Two times a day (BID) | ORAL | 0 refills | Status: DC

## 2022-05-16 NOTE — Telephone Encounter (Signed)
Called pt to advise of ABX and she will pick this up and start today. Confirmed appt with Junie Panning on Wednesday. Will call with any questions or concerns.

## 2022-05-16 NOTE — Telephone Encounter (Signed)
Pt couldn't come in tomorrow. She is coming in this afternoon at 2:45

## 2022-05-16 NOTE — Telephone Encounter (Signed)
Patient states her wound vac is full and she has an appointment on Wednesday. Asking what she should do..[phone]

## 2022-05-16 NOTE — Telephone Encounter (Signed)
-----   Message from Newt Minion, MD sent at 05/16/2022  8:13 AM EST ----- Call patient.  Cultures are showing 2 bacteria that are sensitive to Cipro and Bactrim DS.  With her allergy to sulfa a prescription is called in for Cipro. ----- Message ----- From: Buel Ream, Lab [facility] [place] Sent: 05/13/2022  10:48 PM EST To: Newt Minion, MD

## 2022-05-16 NOTE — Telephone Encounter (Signed)
Transition Care Management Follow-up Telephone Call Date of discharge and from where: [facility] 05/14/2022 How have you been since you were released from the hospital? okay Any questions or concerns? No  Items Reviewed: Did the pt receive and understand the discharge instructions provided? Yes  Medications obtained and verified? Yes  Other? No  Any new allergies since your discharge? No  Dietary orders reviewed? Yes Do you have support at home? Yes   Home Care and Equipment/Supplies: Were home health services ordered? no If so, what is the name of the agency? N/a  Has the agency set up a time to come to the patient's home? no Were any new equipment or medical supplies ordered?  No What is the name of the medical supply agency? N/a Were you able to get the supplies/equipment? no Do you have any questions related to the use of the equipment or supplies? No  Functional Questionnaire: (I = Independent and D = Dependent) ADLs: I  Bathing/Dressing- D  Meal Prep- D  Eating- I  Maintaining continence- I  Transferring/Ambulation- D  Managing Meds- I  Follow up appointments reviewed:  PCP Hospital f/u appt confirmed? Yes  Scheduled to see Dr Jodi Mourning on 05/19/2022 @ 1:45. [facility] f/u appt confirmed? Yes  Scheduled to see Dr Sharol Given on 05/18/2022 @ 10:45. Are transportation arrangements needed? No  If their condition worsens, is the pt aware to call PCP or go to the Emergency Dept.? Yes Was the patient provided with contact information for the PCP's office or ED? Yes Was to pt encouraged to call back with questions or concerns? Yes Juanda Crumble, LPN [place] Direct Dial [phone]

## 2022-05-16 NOTE — Telephone Encounter (Signed)
I called and sw pt and asked if she can come this afternoon to remove the vac. Pt states that she is not able to come today but she can come tomorrow morning. Appt sch for 10:15 advised ok to turn vac off so it is not alarming and we will see her in the morning and to call with any other concerns.

## 2022-05-16 NOTE — Progress Notes (Signed)
Office Visit Note   Patient: Lori Schmidt           Date of Birth: 1975/07/09           MRN: [id number] Visit Date: 05/16/2022              Requested by: Angelique Blonder, DO [address] [place],  [place] [address] PCP: Angelique Blonder, DO  Chief Complaint  Patient presents with   Right Leg - Routine Post Op    05/13/22 revision right BKA Wound vac removed, pictures taken. Canister full.      HPI: Patient is a 47 year old woman who is status post revision right transtibial amputation.  Patient presents due to the increase drainage in the canister is full.  Assessment & Plan: Visit Diagnoses:  1. Dehiscence of amputation stump of right lower extremity (HCC)   2. S/P BKA (below knee amputation) unilateral, left ([place])   3. History of right below knee amputation [facility])     Plan: Patient will start Dial soap cleansing dry dressing change continue with compression discussed the importance of elevation and patient will complete her course of Cipro.  Follow-Up Instructions: Return in about 1 week (around 05/23/2022).   Ortho Exam  Patient is alert, oriented, no adenopathy, well-dressed, normal affect, normal respiratory effort. Examination the wound edges are well-approximated.  Patient has clear serosanguineous drainage there is no cellulitis.  Patient's wound cultures were positive for Serratia and staph both were sensitive to Cipro.  Patient started her antibiotics today.  Imaging: No results found.   Labs: Lab Results  Component Value Date   HGBA1C 12.2 (A) 02/25/2022   HGBA1C 10.1 (H) 10/18/2021   ESRSEDRATE 15 03/22/2021   ESRSEDRATE 25 (H) 03/15/2021   ESRSEDRATE 67 (H) 02/26/2021   CRP 0.7 03/22/2021   CRP 0.9 03/15/2021   CRP 7.5 (H) 02/26/2021   REPTSTATUS PENDING 05/13/2022   GRAMSTAIN  05/13/2022    NO WBC SEEN NO ORGANISMS SEEN Performed at [facility], [place] [address], [place], [place] [address]    CULT  05/13/2022    RARE SERRATIA  MARCESCENS FEW STAPHYLOCOCCUS AUREUS NO ANAEROBES ISOLATED; CULTURE IN PROGRESS FOR 5 DAYS    LABORGA SERRATIA MARCESCENS 05/13/2022   LABORGA STAPHYLOCOCCUS AUREUS 05/13/2022     Lab Results  Component Value Date   ALBUMIN 2.1 (L) 03/22/2022   ALBUMIN 3.4 (L) 10/16/2021   ALBUMIN 2.7 (L) 03/22/2021    Lab Results  Component Value Date   MG 1.7 03/24/2022   MG 2.1 02/20/2021   MG 2.0 02/18/2021   No results found for: "VD25OH"  No results found for: "PREALBUMIN"    Latest Ref Rng & Units 05/13/2022    9:42 AM 04/15/2022    8:56 AM 03/25/2022    3:24 AM  CBC EXTENDED  WBC 4.0 - 10.5 K/uL   4.1   RBC 3.87 - 5.11 MIL/uL   3.05   Hemoglobin 12.0 - 15.0 g/dL 13.3  10.2  7.7   HCT 36.0 - 46.0 % 39.0  30.0  24.4   Platelets 150 - 400 K/uL   224      There is no height or weight on file to calculate BMI.  Orders:  No orders of the defined types were placed in this encounter.  No orders of the defined types were placed in this encounter.    Procedures: No procedures performed  Clinical Data: No additional findings.  ROS:  All other systems negative,  except as noted in the HPI. Review of Systems  Objective: Vital Signs: LMP  (LMP Unknown) Comment: no bleeding  Specialty Comments:  No specialty comments available.  PMFS History: Patient Active Problem List   Diagnosis Date Noted   Dehiscence of amputation stump ([place]) 05/13/2022   Dehiscence of amputation stump of right lower extremity ([place]) 04/15/2022   S/P BKA (below knee amputation) unilateral, right ([place]) 03/24/2022   Severe protein-calorie malnutrition ([place]) 03/23/2022   DKA, type 2 ([place]) 03/23/2022   Subacute osteomyelitis, right ankle and foot ([place]) 03/22/2022   Cellulitis 03/22/2022   Health care maintenance 02/25/2022   Venous insufficiency of right leg 12/01/2021   Leg edema, right 11/17/2021   Encounter for birth control 11/10/2021   Anxiety 10/26/2021   Abscess of skin and subcutaneous tissue  10/16/2021   Dyslipidemia    Hypoalbuminemia due to protein-calorie malnutrition (HCC)    Diabetic peripheral neuropathy (HCC)    Acute blood loss anemia    S/P BKA (below knee amputation) unilateral, left ([place]) 03/05/2021   Left below-knee amputee ([place]) 03/05/2021   Adjustment disorder with mixed anxiety and depressed mood    Pressure injury of skin 02/28/2021   Discitis of lumbar region    Diabetes mellitus ([place])    Left leg cellulitis    Morbid obesity with BMI of 40.0-44.9, adult ([place]) 01/05/2012   Past Medical History:  Diagnosis Date   Hyperlipidemia    Hypertension    Obesity    Type 2 diabetes mellitus ([place])     Family History  Problem Relation Age of Onset   Hypothyroidism Mother    Diabetes Mother    Diabetes Maternal Grandmother    Breast cancer Paternal Grandmother     Past Surgical History:  Procedure Laterality Date   AMPUTATION Left 02/19/2021   Procedure: AMPUTATION BELOW KNEE;  Surgeon: Newt Minion, MD;  Location: [facility];  Service: Orthopedics;  Laterality: Left;   AMPUTATION Right 03/23/2022   Procedure: RIGHT BELOW KNEE AMPUTATION;  Surgeon: Newt Minion, MD;  Location: [facility];  Service: Orthopedics;  Laterality: Right;   APPLICATION OF WOUND VAC Right 05/13/2022   Procedure: APPLICATION OF WOUND VAC;  Surgeon: Newt Minion, MD;  Location: [facility];  Service: Orthopedics;  Laterality: Right;   BREAST CYST EXCISION Right 05/2018   four cysts removed on right breast/axilla area   STUMP REVISION Right 04/15/2022   Procedure: REVISION RIGHT BELOW KNEE AMPUTATION;  Surgeon: Newt Minion, MD;  Location: [facility];  Service: Orthopedics;  Laterality: Right;   STUMP REVISION Right 05/13/2022   Procedure: RIGHT BELOW THE KNEE AMPUTATION REVISION;  Surgeon: Newt Minion, MD;  Location: [facility];  Service: Orthopedics;  Laterality: Right;   TEE WITHOUT CARDIOVERSION N/A 02/23/2021   Procedure: TRANSESOPHAGEAL ECHOCARDIOGRAM (TEE);  Surgeon: Pixie Casino, MD;  Location:  [facility] ENDOSCOPY;  Service: Cardiovascular;  Laterality: N/A;   Social History   Occupational History   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

## 2022-05-17 ENCOUNTER — Encounter: Payer: Commercial Managed Care - HMO | Admitting: Orthopedic Surgery

## 2022-05-18 ENCOUNTER — Encounter: Payer: Commercial Managed Care - HMO | Admitting: Family

## 2022-05-18 LAB — AEROBIC/ANAEROBIC CULTURE W GRAM STAIN (SURGICAL/DEEP WOUND): Gram Stain: NONE SEEN

## 2022-05-19 ENCOUNTER — Other Ambulatory Visit: Payer: Self-pay | Admitting: Student

## 2022-05-19 ENCOUNTER — Ambulatory Visit (INDEPENDENT_AMBULATORY_CARE_PROVIDER_SITE_OTHER): Payer: Commercial Managed Care - HMO | Admitting: Student

## 2022-05-19 ENCOUNTER — Encounter: Payer: Self-pay | Admitting: Student

## 2022-05-19 VITALS — BP 121/81 | HR 99 | Temp 98.2°F | Ht 67.0 in | Wt 258.3 lb

## 2022-05-19 DIAGNOSIS — E1169 Type 2 diabetes mellitus with other specified complication: Secondary | ICD-10-CM

## 2022-05-19 DIAGNOSIS — Z794 Long term (current) use of insulin: Secondary | ICD-10-CM | POA: Diagnosis not present

## 2022-05-19 DIAGNOSIS — Z89512 Acquired absence of left leg below knee: Secondary | ICD-10-CM | POA: Diagnosis not present

## 2022-05-19 DIAGNOSIS — Z89511 Acquired absence of right leg below knee: Secondary | ICD-10-CM | POA: Diagnosis not present

## 2022-05-19 DIAGNOSIS — Z3042 Encounter for surveillance of injectable contraceptive: Secondary | ICD-10-CM

## 2022-05-19 DIAGNOSIS — Z0000 Encounter for general adult medical examination without abnormal findings: Secondary | ICD-10-CM

## 2022-05-19 LAB — GLUCOSE, CAPILLARY: Glucose-Capillary: 263 mg/dL — ABNORMAL HIGH (ref 70–99)

## 2022-05-19 LAB — POCT GLYCOSYLATED HEMOGLOBIN (HGB A1C): Hemoglobin A1C: 9.5 % — AB (ref 4.0–5.6)

## 2022-05-19 MED ORDER — SEMAGLUTIDE(0.25 OR 0.5MG/DOS) 2 MG/3ML ~~LOC~~ SOPN: PEN_INJECTOR | SUBCUTANEOUS | 3 refills | Status: DC

## 2022-05-19 NOTE — Progress Notes (Signed)
CC: Status post right BKA revision  HPI:  Lori Schmidt is a 47 y.o. female with PMH as below who presents to the clinic for follow-up visit  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   Type 2 diabetes mellitus (HCC)    Review of Systems:   Comprehensive review of systems was negative  Physical Exam:  Vitals:   05/19/22 1345  BP: 121/81  Pulse: 99  Temp: 98.2 F (36.8 C)  TempSrc: Oral  SpO2: 98%  Weight: 258 lb 4.8 oz (117.2 kg)  Height: 5\' 7"  (1.702 m)    Constitutional: Middle-aged female with bilateral lower extremity amputations sitting in wheelchair. In no acute distress. Cardio:Regular rate and rhythm. No murmurs, rubs, or gallops. 2+ bilateral radial pulses. Pulm:Clear to auscultation bilaterally. Normal work of breathing on room air. Abdomen: Soft, non-tender, non-distended, positive bowel sounds. ZOX:WRUEAVWU for extremity edema.  Stable left BKA with prosthesis on.  Bandaged right BKA without proximal erythema or edema. Skin:Warm and dry. Neuro:Alert and oriented x3. No focal deficit noted. Psych:Pleasant mood and affect.   Assessment & Plan:   Diabetes mellitus (HCC) A1c down from 12.2 to 9.5.  She continues on Levemir 50 units daily and has a good supply left, will plan to switch to Lantus when she runs out.  She also continues on Farxiga 10 mg daily.  We had planned to start her on Ozempic but there were issues with her past insurance.  She now has new insurance so we will do this again.  She is also recently had problems using her Dexcom so we will schedule a visit with our diabetes coordinator next week to help figure this out. - Continue Levemir 50 units daily, Farxiga 10 mg daily, and start Ozempic 0.25 mg weekly for 4 weeks then increase to 0.5 mg weekly - Return in 1 month for reevaluation  Encounter for birth  control Patient is due for another Depo-Provera injection in about a week.  Will have her come back to see our diabetes coordinator to help with Dexcom and she will have a nurses visit to get this injection.  S/P BKA (below knee amputation) unilateral, right [facility]) Patient presents after ongoing revision of right transtibial amputation on 05/16/2022 after having wound dehiscence and infection growing Serratia and staph.  Culture data shows it is sensitive to Cipro and we will continue this for 30 days.  She is also about to finish her course of Augmentin.  She will follow-up with Dr. Lajoyce Corners on 05/24/2022.  She also saw Dr. Lajoyce Corners a couple days ago with noted good healing after revision.  She is having problems with her current wheelchair as the brakes do not work and this is what caused her fall that resulted in her wound dehiscence and infection.  Will send an order for a new wheelchair as she will need this going forward and it will be dangerous if she has to use the wheelchair she currently has.  Health care maintenance We discussed healthcare maintenance including colonoscopy and cervical cancer screening which she is due for.  We will plan to revisit this as her right BKA revision heals.  She does have new insurance now and with me will be easier for her to have these necessary screenings done. - Reevaluate and refer for colonoscopy and cervical cancer screening at next visit, also  need diabetic eye exam in the near future    Patient discussed with Dr. Alfonzo Beers, DO [facility] Internal Medicine Resident PGY-1 Pager: [phone]

## 2022-05-19 NOTE — Patient Instructions (Addendum)
  Thank you, Ms.Marlou Sa, for allowing [place] to provide your care today. Today we discussed . . .  > Diabetes       -Your A1c is down to 9.5!  Keep up the good work with your diet and your Levemir and [place].  We are going to add Ozempic which will be at a low dose for 4 weeks to help decrease the side effects.  You will take 0.25 mg weekly for 4 weeks and then increase to 0.5 mg weekly after that.  Please also make sure that you are checking your blood sugar at home whether it is with the Dexcom or another glucometer.  We will have you back in 1 month to see how you are doing on the Ozempic and increase the dose if you are tolerating it.  Before that we will have you back to see [name] in 1 week to help fix the Dexcom as well as to get your Depo-Provera injection.   I have ordered the following labs for you:  Lab Orders         Glucose, capillary         POC Hbg A1C       Tests ordered today:  None   Referrals ordered today:   Referral Orders  No referral(s) requested today      I have ordered the following medication/changed the following medications:   Stop the following medications: Medications Discontinued During This Encounter  Medication Reason   exenatide (BYETTA 5 MCG PEN) 5 MCG/0.02ML SOPN injection Change in therapy     Start the following medications: Meds ordered this encounter  Medications   Semaglutide,0.25 or 0.5MG /DOS, 2 MG/3ML SOPN    Sig: Inject 0.25 mg into the skin once a week for 28 days, THEN 0.5 mg once a week for 28 days.    Dispense:  3 mL    Refill:  3      Follow up:  1 month     Remember:     Should you have any questions or concerns please call the internal medicine clinic at [phone].     Johny Blamer, [place]

## 2022-05-24 ENCOUNTER — Encounter: Payer: Self-pay | Admitting: Orthopedic Surgery

## 2022-05-24 ENCOUNTER — Ambulatory Visit: Payer: Medicaid Other | Admitting: Orthopedic Surgery

## 2022-05-24 DIAGNOSIS — T8781 Dehiscence of amputation stump: Secondary | ICD-10-CM

## 2022-05-24 NOTE — Progress Notes (Signed)
Office Visit Note   Patient: Lori Schmidt           Date of Birth: 18-Jul-1975           MRN: [id number] Visit Date: 05/24/2022              Requested by: Angelique Blonder, DO [address] [place],  [place] [address] PCP: Angelique Blonder, DO  Chief Complaint  Patient presents with   Right Leg - Routine Post Op    05/13/2022 revision right BKA       HPI: Patient is a 47 year old woman who presents 10 days status post revision right below-knee amputation.  Patient has placement of Fang powder and Kerecis tissue graft micro.  Assessment & Plan: Visit Diagnoses:  1. Dehiscence of amputation stump of right lower extremity (HCC)     Plan: Patient's wound continues to show good improvement.  She will wear the 4 XL compression sock and tried to decrease to a 3 XL compression sock she will complete her course of Cipro.  Follow-Up Instructions: Return in about 1 week (around 05/31/2022).   Ortho Exam  Patient is alert, oriented, no adenopathy, well-dressed, normal affect, normal respiratory effort. Examination the residual limb is almost completely dry there is no cellulitis there is no tenderness to palpation the wound edges are well-approximated.  Imaging: No results found.   Labs: Lab Results  Component Value Date   HGBA1C 9.5 (A) 05/19/2022   HGBA1C 12.2 (A) 02/25/2022   HGBA1C 10.1 (H) 10/18/2021   ESRSEDRATE 15 03/22/2021   ESRSEDRATE 25 (H) 03/15/2021   ESRSEDRATE 67 (H) 02/26/2021   CRP 0.7 03/22/2021   CRP 0.9 03/15/2021   CRP 7.5 (H) 02/26/2021   REPTSTATUS 05/18/2022 FINAL 05/13/2022   GRAMSTAIN NO WBC SEEN NO ORGANISMS SEEN  05/13/2022   CULT  05/13/2022    RARE SERRATIA MARCESCENS FEW STAPHYLOCOCCUS AUREUS NO ANAEROBES ISOLATED Performed at [facility], [place] [address] [place], [place] [address]    LABORGA SERRATIA MARCESCENS 05/13/2022   LABORGA STAPHYLOCOCCUS AUREUS 05/13/2022     Lab Results  Component Value Date   ALBUMIN 2.1 (L)  03/22/2022   ALBUMIN 3.4 (L) 10/16/2021   ALBUMIN 2.7 (L) 03/22/2021    Lab Results  Component Value Date   MG 1.7 03/24/2022   MG 2.1 02/20/2021   MG 2.0 02/18/2021   No results found for: "VD25OH"  No results found for: "PREALBUMIN"    Latest Ref Rng & Units 05/13/2022    9:42 AM 04/15/2022    8:56 AM 03/25/2022    3:24 AM  CBC EXTENDED  WBC 4.0 - 10.5 K/uL   4.1   RBC 3.87 - 5.11 MIL/uL   3.05   Hemoglobin 12.0 - 15.0 g/dL 13.3  10.2  7.7   HCT 36.0 - 46.0 % 39.0  30.0  24.4   Platelets 150 - 400 K/uL   224      There is no height or weight on file to calculate BMI.  Orders:  No orders of the defined types were placed in this encounter.  No orders of the defined types were placed in this encounter.    Procedures: No procedures performed  Clinical Data: No additional findings.  ROS:  All other systems negative, except as noted in the HPI. Review of Systems  Objective: Vital Signs: LMP  (LMP Unknown) Comment: no bleeding  Specialty Comments:  No specialty comments available.  PMFS History: Patient Active Problem List  Diagnosis Date Noted   Dehiscence of amputation stump ([place]) 05/13/2022   Dehiscence of amputation stump of right lower extremity ([place]) 04/15/2022   S/P BKA (below knee amputation) unilateral, right ([place]) 03/24/2022   Severe protein-calorie malnutrition ([place]) 03/23/2022   DKA, type 2 ([place]) 03/23/2022   Subacute osteomyelitis, right ankle and foot ([place]) 03/22/2022   Cellulitis 03/22/2022   Health care maintenance 02/25/2022   Venous insufficiency of right leg 12/01/2021   Leg edema, right 11/17/2021   Encounter for birth control 11/10/2021   Anxiety 10/26/2021   Abscess of skin and subcutaneous tissue 10/16/2021   Dyslipidemia    Hypoalbuminemia due to protein-calorie malnutrition (HCC)    Diabetic peripheral neuropathy (HCC)    Acute blood loss anemia    S/P BKA (below knee amputation) unilateral, left ([place]) 03/05/2021   Left  below-knee amputee ([place]) 03/05/2021   Adjustment disorder with mixed anxiety and depressed mood    Pressure injury of skin 02/28/2021   Discitis of lumbar region    Diabetes mellitus ([place])    Left leg cellulitis    Morbid obesity with BMI of 40.0-44.9, adult ([place]) 01/05/2012   Past Medical History:  Diagnosis Date   Hyperlipidemia    Hypertension    Obesity    Type 2 diabetes mellitus ([place])     Family History  Problem Relation Age of Onset   Hypothyroidism Mother    Diabetes Mother    Diabetes Maternal Grandmother    Breast cancer Paternal Grandmother     Past Surgical History:  Procedure Laterality Date   AMPUTATION Left 02/19/2021   Procedure: AMPUTATION BELOW KNEE;  Surgeon: Newt Minion, MD;  Location: [facility];  Service: Orthopedics;  Laterality: Left;   AMPUTATION Right 03/23/2022   Procedure: RIGHT BELOW KNEE AMPUTATION;  Surgeon: Newt Minion, MD;  Location: [place];  Service: Orthopedics;  Laterality: Right;   APPLICATION OF WOUND VAC Right 05/13/2022   Procedure: APPLICATION OF WOUND VAC;  Surgeon: Newt Minion, MD;  Location: [facility];  Service: Orthopedics;  Laterality: Right;   BREAST CYST EXCISION Right 05/2018   four cysts removed on right breast/axilla area   STUMP REVISION Right 04/15/2022   Procedure: REVISION RIGHT BELOW KNEE AMPUTATION;  Surgeon: Newt Minion, MD;  Location: [facility];  Service: Orthopedics;  Laterality: Right;   STUMP REVISION Right 05/13/2022   Procedure: RIGHT BELOW THE KNEE AMPUTATION REVISION;  Surgeon: Newt Minion, MD;  Location: [facility];  Service: Orthopedics;  Laterality: Right;   TEE WITHOUT CARDIOVERSION N/A 02/23/2021   Procedure: TRANSESOPHAGEAL ECHOCARDIOGRAM (TEE);  Surgeon: Pixie Casino, MD;  Location: [facility] ENDOSCOPY;  Service: Cardiovascular;  Laterality: N/A;   Social History   Occupational History   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

## 2022-05-24 NOTE — Telephone Encounter (Signed)
I will need a prior authorization request from the pharmacy in order to initiate.

## 2022-05-25 ENCOUNTER — Telehealth: Payer: Self-pay

## 2022-05-25 NOTE — Telephone Encounter (Signed)
Decision:Approved Lori Schmidt (Key: [id number]) PA Case ID #: [id number] Rx #: [id number] Need Help? Call us at [phone] Outcome Approved today CaseId:[id number];Status:Approved;Review Type:Prior Auth;Coverage Start Date:05/25/2022;Coverage End Date:05/25/2023; Authorization Expiration Date: 05/24/2023 Drug Byetta 5 MCG Pen 5MCG/0.02ML pen-injectors ePA cloud Secretary/administrator PA Form [phone] NCPDP) Original Claim Info [address] [place]

## 2022-05-25 NOTE — Telephone Encounter (Signed)
Prior Authorization for patient (byetta) came through on cover my meds was submitted with last office notes awaiting approval or denial

## 2022-05-26 ENCOUNTER — Ambulatory Visit (INDEPENDENT_AMBULATORY_CARE_PROVIDER_SITE_OTHER): Payer: Commercial Managed Care - HMO | Admitting: *Deleted

## 2022-05-26 ENCOUNTER — Encounter: Payer: Self-pay | Admitting: Student

## 2022-05-26 ENCOUNTER — Ambulatory Visit (INDEPENDENT_AMBULATORY_CARE_PROVIDER_SITE_OTHER): Payer: Commercial Managed Care - HMO | Admitting: Dietician

## 2022-05-26 DIAGNOSIS — E1169 Type 2 diabetes mellitus with other specified complication: Secondary | ICD-10-CM

## 2022-05-26 DIAGNOSIS — Z794 Long term (current) use of insulin: Secondary | ICD-10-CM | POA: Diagnosis not present

## 2022-05-26 DIAGNOSIS — Z30013 Encounter for initial prescription of injectable contraceptive: Secondary | ICD-10-CM

## 2022-05-26 DIAGNOSIS — Z89511 Acquired absence of right leg below knee: Secondary | ICD-10-CM

## 2022-05-26 DIAGNOSIS — Z3042 Encounter for surveillance of injectable contraceptive: Secondary | ICD-10-CM

## 2022-05-26 HISTORY — DX: Acquired absence of right leg below knee: Z89.511

## 2022-05-26 MED ORDER — MEDROXYPROGESTERONE ACETATE 104 MG/0.65ML ~~LOC~~ SUSY
104.0000 mg | PREFILLED_SYRINGE | Freq: Once | SUBCUTANEOUS | Status: AC
  Administered 2022-05-26: 104 mg via SUBCUTANEOUS

## 2022-05-26 NOTE — Patient Instructions (Signed)
Thank you for your visit today!  We talked about:   Things that increase insulin- caffeine, saturated fats in pork and beef, carbs that included ALL fruit, starches and   Goals to work on:   -Eating smaller amounts of food more often Suggest having a carb( grapes, whole wheat bread or crackers)  - try to inject only 50 units at a time  - thinking about decreasing caffeinated and dark diet soda. "Clear sodas are better" root beer,     Please feel free to call me anytime.  [name] [phone]

## 2022-05-26 NOTE — Assessment & Plan Note (Signed)
Patient is due for another Depo-Provera injection in about a week.  Will have her come back to see our diabetes coordinator to help with Dexcom and she will have a nurses visit to get this injection.

## 2022-05-26 NOTE — Assessment & Plan Note (Addendum)
A1c down from 12.2 to 9.5.  She continues on Levemir 50 units daily and has a good supply left, will plan to switch to Lantus when she runs out.  She also continues on Farxiga 10 mg daily.  We had planned to start her on Ozempic but there were issues with her past insurance.  She now has new insurance so we will do this again.  She is also recently had problems using her Dexcom so we will schedule a visit with our diabetes coordinator next week to help figure this out. - Continue Levemir 50 units daily, Farxiga 10 mg daily, and start Ozempic 0.25 mg weekly for 4 weeks then increase to 0.5 mg weekly - Return in 1 month for reevaluation

## 2022-05-26 NOTE — Assessment & Plan Note (Signed)
Patient presents after ongoing revision of right transtibial amputation on 05/16/2022 after having wound dehiscence and infection growing Serratia and staph.  Culture data shows it is sensitive to Cipro and we will continue this for 30 days.  She is also about to finish her course of Augmentin.  She will follow-up with Dr. Sharol Given on 05/24/2022.  She also saw Dr. Sharol Given a couple days ago with noted good healing after revision.  She is having problems with her current wheelchair as the brakes do not work and this is what caused her fall that resulted in her wound dehiscence and infection.  Will send an order for a new wheelchair as she will need this going forward and it will be dangerous if she has to use the wheelchair she currently has.

## 2022-05-26 NOTE — Progress Notes (Signed)
Lab Results  Component Value Date   HGBA1C 9.5 (A) 05/19/2022   HGBA1C 12.2 (A) 02/25/2022   HGBA1C 10.1 (H) 10/18/2021    Diabetes Self-Management Education  Visit Type: First/Initial  Appt. Start Time: 115 Appt. End Time: 2115  05/26/2022  [name], identified by name and date of birth, is a 47 y.o. female with a diagnosis of Diabetes: Type 2 (both parents have type 2 dm, niece has type 1).   ASSESSMENT Weight deferred today. She was tired today and frustrated by high blood sugars. Both she an her daughter were very interactive and concerned during the visit. Plan for 6-10 visit in the next 12 months. Has not gotten byetta or semaglutide yet. Discussed this with patient and triage nurses she would be better served with a once weekly medication such as semaglutide.   Also discussed options for better blood sugar control including adjusting meal pattern, a different insulin regimen to cover once large meal and correction insulin. She is working on physical activity but says this is limited right now by her healing of her amputations.    Diabetes Self-Management Education - 05/26/22 1400       Visit Information   Visit Type First/Initial      Initial Visit   Diabetes Type Type 2   both parents have type 2 dm, niece has type 1   Date Diagnosed 2008    Are you currently following a meal plan? No   eats once a day mail large meal between 3-6 PM   Are you taking your medications as prescribed? No   is taking 100 units levemir twice daily despite verbalizing that doctor recommended 50 u twice daily. got frustrated with high blood sugars and increased it back to 100u twice daily.     Psychosocial Assessment   Patient Belief/Attitude about Diabetes Motivated to manage diabetes    What is the hardest part about your diabetes right now, causing you the most concern, or is the most worrisome to you about your diabetes?   Being active   she likes to be active and cannot be as active  right now due to amputations   Self-care barriers Lack of transportation;Unsteady gait/risk for falls;Lack of material resources;Impaired vision   can see when she has glasses, she forgot them today   Self-management support Friends;Family;Doctor's office;CDE visits    Other persons present Other (comment);Friend   her 41 year old daughter is very supportive   Patient Concerns Glycemic Control    Special Needs None    Preferred Learning Style No preference indicated    Learning Readiness Ready    How often do you need to have someone help you when you read instructions, pamphlets, or other written materials from your doctor or pharmacy? 2 - Rarely    What is the last grade level you completed in school? 12      Pre-Education Assessment   Patient understands the diabetes disease and treatment process. Needs Instruction    Patient understands incorporating nutritional management into lifestyle. Needs Instruction    Patient undertands incorporating physical activity into lifestyle. Comprehends key points    Patient understands using medications safely. Needs Instruction    Patient understands monitoring blood glucose, interpreting and using results Needs Review    Patient understands prevention, detection, and treatment of acute complications. Needs Review    Patient understands prevention, detection, and treatment of chronic complications. Compreheands key points    Patient understands how to develop strategies to  address psychosocial issues. Comprehends key points    Patient understands how to develop strategies to promote health/change behavior. Needs Review      Complications   Last HgB A1C per patient/outside source 9.5 %    How often do you check your blood sugar? > 4 times/day   just got dexcom on 2 days ago, show ablove target all day except  between 3-6 PM when she is busy getting dinner ready   Fasting Blood glucose range (mg/dL) >200    Postprandial Blood glucose range (mg/dL) >200     Number of hypoglycemic episodes per month 0    Number of hyperglycemic episodes ( >263m/dL): Daily    Can you tell when your blood sugar is high? Yes   is very thirsty   What do you do if your blood sugar is high? need to assess more,adjusted her insulin to previous dose recently due to high blood sugar    Have you had a dilated eye exam in the past 12 months? No    Have you had a dental exam in the past 12 months? No    Are you checking your feet? N/A   bilateral amputations     Dietary Intake   Breakfast skips except for water, diet,caffeinated soda and meds    Lunch skips, except may snack on grapes and diet soda and water    Snack (afternoon) has sweets rarely    Dinner main meal 3-6 PM, is large with 1/3 plate potatoes, 1/3 vegetables, 1/3 protein( read meats often)   also has 3-4 rolls with country crock margarine with this meal most days   Snack (evening) 9-10 PM handful of , diet soda and watergrapes    Beverage(s) 6 cans diet caffeinated soda per day, 8 bottels of water a day      Activity / Exercise   Activity / Exercise Type ADL's    How many days per week do you exercise? 3    How many minutes per day do you exercise? 60    Total minutes per week of exercise 180      Patient Education   Previous Diabetes Education Yes (please comment)   intermittent, but little formal education due to uninsured status and prior worked a lot of hours as fElectronics engineer  Disease Pathophysiology Factors that contribute to the development of diabetes;Explored patient's options for treatment of their diabetes    Healthy Eating Role of diet in the treatment of diabetes and the relationship between the three main macronutrients and blood glucose level;Plate Method;Meal timing in regards to the patients' current diabetes medication.    Medications Taught/reviewed insulin/injectables, injection, site rotation, insulin/injectables storage and needle disposal.;Reviewed patients medication for  diabetes, action, purpose, timing of dose and side effects.    Monitoring Taught/evaluated CGM (comment)   she is now sharing her CGM data with our office   Acute complications Discussed and identified patients' prevention, symptoms, and treatment of hyperglycemia.      Individualized Goals (developed by patient)   Nutrition Follow meal plan discussed;General guidelines for healthy choices and portions discussed   see patient instructions for details     Outcomes   Expected Outcomes Demonstrated interest in learning. Expect positive outcomes;Demonstrated interest in learning but significant barriers to change    Future DMSE 2 wks    Program Status Not Completed             Individualized Plan for Diabetes Self-Management Training:   Learning  Objective:  Patient will have a greater understanding of diabetes self-management. Patient education plan is to attend individual and/or group sessions per assessed needs and concerns.   Plan:   Patient Instructions  Thank you for your visit today!  We talked about:   Things that increase insulin- caffeine, saturated fats in pork and beef, carbs that included ALL fruit, starches and   Goals to work on:   -Eating smaller amounts of food more often Suggest having a carb( grapes, whole wheat bread or crackers)  - try to inject only 50 units at a time  - thinking about decreasing caffeinated and dark diet soda. "Clear sodas are better" root beer,     Please feel free to call me anytime.  [name] [phone]   Expected Outcomes:  Demonstrated interest in learning. Expect positive outcomes, Demonstrated interest in learning but significant barriers to change  Education material provided: Diabetes Resources  If problems or questions, patient to contact team via:  Phone  Future DSME appointment: 2 wks Debera Lat, RD 05/26/2022 3:50 PM.

## 2022-05-26 NOTE — Progress Notes (Signed)
Next Depo due will be 08/18/22 - 09/01/22; dates given to pt.

## 2022-05-26 NOTE — Assessment & Plan Note (Signed)
We discussed healthcare maintenance including colonoscopy and cervical cancer screening which she is due for.  We will plan to revisit this as her right BKA revision heals.  She does have new insurance now and with me will be easier for her to have these necessary screenings done. - Reevaluate and refer for colonoscopy and cervical cancer screening at next visit, also need diabetic eye exam in the near future

## 2022-05-26 NOTE — Progress Notes (Unsigned)
Referral request

## 2022-05-27 ENCOUNTER — Telehealth: Payer: Self-pay

## 2022-05-27 NOTE — Telephone Encounter (Signed)
Prior Authorization for patient (semaglutide) came through on cover my meds was submitted with last office notes awaiting approval or denial.

## 2022-05-30 NOTE — Progress Notes (Signed)
Internal Medicine Clinic Attending  Case discussed with Dr. Nikki Dom  At the time of the visit.  We reviewed the resident's history and exam and pertinent patient test results.  I agree with the assessment, diagnosis, and plan of care documented in the resident's note.

## 2022-05-30 NOTE — Telephone Encounter (Signed)
Decision:Denied Lori Schmidt (Key: [id number]) Ozempic (0.25 or 0.5 MG/DOSE) 2MG/3ML pen-injectors Form Librarian, academic PA Form (2017 NCPDP)  Message from Plan CaseId:[id number];Status:Denied;Review Type:;Appeal Information: [place] [id number]; Important - Please read the below note on eAppeals: Please reference the denial letter for information on the rights for an appeal, rationale for the denial, and how to submit an appeal including if any information is needed to support the appeal. Note about urgent situations - Generally, an urgent situation is one which, in the opinion of the provider, the health of the patient may be in serious jeopardy or may experience pain that cannot be adequately controlled while waiting for a decision on the appeal.;

## 2022-05-31 ENCOUNTER — Ambulatory Visit (INDEPENDENT_AMBULATORY_CARE_PROVIDER_SITE_OTHER): Payer: Commercial Managed Care - HMO | Admitting: Family

## 2022-05-31 ENCOUNTER — Encounter: Payer: Self-pay | Admitting: Family

## 2022-05-31 DIAGNOSIS — T8781 Dehiscence of amputation stump: Secondary | ICD-10-CM

## 2022-05-31 NOTE — Progress Notes (Signed)
Post-Op Visit Note   Patient: Lori Schmidt           Date of Birth: 10-29-1975           MRN: [id number] Visit Date: 05/31/2022 PCP: Angelique Blonder, DO  Chief Complaint: No chief complaint on file.   HPI:  HPI The patient is a 47 year old woman seen status post revision right below-knee amputation.  She is wearing her shrinker with direct skin contact Ortho Exam Incision well-approximated sutures is healing quite well there is 2 areas that have scant serosanguineous drainage there is no gaping no erythema Visit Diagnoses: No diagnosis found.  Plan: continue daily dial soap cleansing. Shrinker. Plan to harvest sutures next week  Follow-Up Instructions: No follow-ups on file.   Imaging: No results found.  Orders:  No orders of the defined types were placed in this encounter.  No orders of the defined types were placed in this encounter.    PMFS History: Patient Active Problem List   Diagnosis Date Noted   Dehiscence of amputation stump ([place]) 05/13/2022   Dehiscence of amputation stump of right lower extremity ([place]) 04/15/2022   S/P BKA (below knee amputation) unilateral, right ([place]) 03/24/2022   Severe protein-calorie malnutrition ([place]) 03/23/2022   DKA, type 2 ([place]) 03/23/2022   Health care maintenance 02/25/2022   Venous insufficiency of right leg 12/01/2021   Encounter for birth control 11/10/2021   Anxiety 10/26/2021   Abscess of skin and subcutaneous tissue 10/16/2021   Dyslipidemia    Diabetic peripheral neuropathy (HCC)    Acute blood loss anemia    S/P BKA (below knee amputation) unilateral, left ([place]) 03/05/2021   Adjustment disorder with mixed anxiety and depressed mood    Pressure injury of skin 02/28/2021   Discitis of lumbar region    Diabetes mellitus ([place])    Morbid obesity with BMI of 40.0-44.9, adult ([place]) 01/05/2012   Past Medical History:  Diagnosis Date   Hyperlipidemia    Hypertension    Left below-knee amputee ([place]) 03/05/2021    Obesity    Right below-knee amputee ([place]) 05/26/2022   Type 2 diabetes mellitus ([place])     Family History  Problem Relation Age of Onset   Hypothyroidism Mother    Diabetes Mother    Diabetes Maternal Grandmother    Breast cancer Paternal Grandmother     Past Surgical History:  Procedure Laterality Date   AMPUTATION Left 02/19/2021   Procedure: AMPUTATION BELOW KNEE;  Surgeon: Newt Minion, MD;  Location: [facility];  Service: Orthopedics;  Laterality: Left;   AMPUTATION Right 03/23/2022   Procedure: RIGHT BELOW KNEE AMPUTATION;  Surgeon: Newt Minion, MD;  Location: [facility];  Service: Orthopedics;  Laterality: Right;   APPLICATION OF WOUND VAC Right 05/13/2022   Procedure: APPLICATION OF WOUND VAC;  Surgeon: Newt Minion, MD;  Location: [facility];  Service: Orthopedics;  Laterality: Right;   BREAST CYST EXCISION Right 05/2018   four cysts removed on right breast/axilla area   STUMP REVISION Right 04/15/2022   Procedure: REVISION RIGHT BELOW KNEE AMPUTATION;  Surgeon: Newt Minion, MD;  Location: [facility];  Service: Orthopedics;  Laterality: Right;   STUMP REVISION Right 05/13/2022   Procedure: RIGHT BELOW THE KNEE AMPUTATION REVISION;  Surgeon: Newt Minion, MD;  Location: [facility];  Service: Orthopedics;  Laterality: Right;   TEE WITHOUT CARDIOVERSION N/A 02/23/2021   Procedure: TRANSESOPHAGEAL ECHOCARDIOGRAM (TEE);  Surgeon: Pixie Casino, MD;  Location: [facility];  Service:  Cardiovascular;  Laterality: N/A;   Social History   Occupational History   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

## 2022-06-01 NOTE — Telephone Encounter (Addendum)
Resubmitted prior authorization through medicaid unable to submit due to patient's primary coverage is [place].

## 2022-06-01 NOTE — Telephone Encounter (Deleted)
I will need a prior authorization request from the pharmacy I will contact the pharmacy.

## 2022-06-02 ENCOUNTER — Other Ambulatory Visit: Payer: Self-pay | Admitting: Student

## 2022-06-02 DIAGNOSIS — Z794 Long term (current) use of insulin: Secondary | ICD-10-CM

## 2022-06-02 MED ORDER — LIRAGLUTIDE 18 MG/3ML ~~LOC~~ SOPN: 0.6000 mg | PEN_INJECTOR | Freq: Every day | SUBCUTANEOUS | 1 refills | Status: DC

## 2022-06-02 NOTE — Progress Notes (Signed)
Per LOV, Rx for Ozempic sent but denied by primary insurance. Unable to process through secondary insurance. Of note, was prescribed Byetta previously which is approved through her insurance. However, patient would benefit from once daily injection such as Victoza than the twice daily dosing for Byetta. Will send Rx for Victoza to pharmacy and await for approval. If not approved, will transition back to Byetta for insurance coverage.

## 2022-06-03 NOTE — Telephone Encounter (Signed)
Prior Authorization for patient (victoza) per cover my meds "Submit Bill To Other Processor Or Primary Payer", called and spoke to patient she stated she only has medicaid [place] [employer] she not longer has [place], I advised patient that I am unable to submit the authorization under healthy blue because her coverage is still showing up as [place] which is inactive. Advised patient to call healthy blue and [employer]igna to make sure her plans are correct. Patient stated she will give [place] a call back.

## 2022-06-06 ENCOUNTER — Ambulatory Visit: Payer: Commercial Managed Care - HMO | Admitting: Registered"

## 2022-06-08 ENCOUNTER — Encounter: Payer: Self-pay | Admitting: Family

## 2022-06-08 ENCOUNTER — Ambulatory Visit (INDEPENDENT_AMBULATORY_CARE_PROVIDER_SITE_OTHER): Payer: Medicaid Other | Admitting: Family

## 2022-06-08 DIAGNOSIS — Z89512 Acquired absence of left leg below knee: Secondary | ICD-10-CM

## 2022-06-08 DIAGNOSIS — Z89511 Acquired absence of right leg below knee: Secondary | ICD-10-CM

## 2022-06-08 NOTE — Progress Notes (Signed)
Post-Op Visit Note   Patient: Lori Schmidt           Date of Birth: [birth year]           MRN: [id number] Visit Date: 06/08/2022 PCP: Angelique Blonder, DO  Chief Complaint:  Chief Complaint  Patient presents with   Right Leg - Routine Post Op    05/13/2022 right BKA revision [place] d/c'd today     HPI:  HPI The patient is a 47 year old woman who presents 4 weeks status post right below-knee amputation revision.  She has been doing dry dressing changes.  Pleased with her healing.  Patient is a new right transtibial  amputee.  Patient's current comorbidities are not expected to impact the ability to function with the prescribed prosthesis. Patient verbally communicates a strong desire to use a prosthesis. Patient currently requires mobility aids to ambulate without a prosthesis.  Expects not to use mobility aids with a new prosthesis.  Patient is a K3 level ambulator that spends a lot of time walking around on uneven terrain over obstacles, up and down stairs, and ambulates with a variable cadence.  Patient is an existing left transtibial  amputee.  Patient's current comorbidities are not expected to impact the ability to function with the prescribed prosthesis. Patient verbally communicates a strong desire to use a prosthesis. Patient currently requires mobility aids to ambulate without a prosthesis.  Expects not to use mobility aids with a new prosthesis.  Patient is a K3 level ambulator that spends a lot of time walking around on uneven terrain over obstacles, up and down stairs, and ambulates with a variable cadence.    Ortho Exam On examination of the right residual limb sutures are in place these were harvested today without incident there is no gaping drainage no sign of erythema,  Edema or infection Visit Diagnoses: No diagnosis found.  Plan: Continue shrinker.  Proceed with prosthesis set up.  Order provided to patient today.  She also will require new socket  for her left below-knee amputation as this is currently ill fitting she is wearing multiple layers of ply and is quite loose.  Follow-Up Instructions: No follow-ups on file.   Imaging: No results found.  Orders:  No orders of the defined types were placed in this encounter.  No orders of the defined types were placed in this encounter.    PMFS History: Patient Active Problem List   Diagnosis Date Noted   Dehiscence of amputation stump ([place]) 05/13/2022   Dehiscence of amputation stump of right lower extremity ([place]) 04/15/2022   S/P BKA (below knee amputation) unilateral, right ([place]) 03/24/2022   Severe protein-calorie malnutrition ([place]) 03/23/2022   DKA, type 2 ([place]) 03/23/2022   Health care maintenance 02/25/2022   Venous insufficiency of right leg 12/01/2021   Encounter for birth control 11/10/2021   Anxiety 10/26/2021   Abscess of skin and subcutaneous tissue 10/16/2021   Dyslipidemia    Diabetic peripheral neuropathy (HCC)    Acute blood loss anemia    S/P BKA (below knee amputation) unilateral, left ([place]) 03/05/2021   Adjustment disorder with mixed anxiety and depressed mood    Pressure injury of skin 02/28/2021   Discitis of lumbar region    Diabetes mellitus ([place])    Morbid obesity with BMI of 40.0-44.9, adult ([place]) 01/05/2012   Past Medical History:  Diagnosis Date   Hyperlipidemia    Hypertension    Left below-knee amputee ([place]) 03/05/2021   Obesity    Right  below-knee amputee ([place]) 05/26/2022   Type 2 diabetes mellitus ([place])     Family History  Problem Relation Age of Onset   Hypothyroidism Mother    Diabetes Mother    Diabetes Maternal Grandmother    Breast cancer Paternal Grandmother     Past Surgical History:  Procedure Laterality Date   AMPUTATION Left 02/19/2021   Procedure: AMPUTATION BELOW KNEE;  Surgeon: Newt Minion, MD;  Location: [facility];  Service: Orthopedics;  Laterality: Left;   AMPUTATION Right 03/23/2022   Procedure: RIGHT BELOW KNEE  AMPUTATION;  Surgeon: Newt Minion, MD;  Location: [facility];  Service: Orthopedics;  Laterality: Right;   APPLICATION OF WOUND VAC Right 05/13/2022   Procedure: APPLICATION OF WOUND VAC;  Surgeon: Newt Minion, MD;  Location: [facility];  Service: Orthopedics;  Laterality: Right;   BREAST CYST EXCISION Right 05/2018   four cysts removed on right breast/axilla area   STUMP REVISION Right 04/15/2022   Procedure: REVISION RIGHT BELOW KNEE AMPUTATION;  Surgeon: Newt Minion, MD;  Location: [facility];  Service: Orthopedics;  Laterality: Right;   STUMP REVISION Right 05/13/2022   Procedure: RIGHT BELOW THE KNEE AMPUTATION REVISION;  Surgeon: Newt Minion, MD;  Location: [facility];  Service: Orthopedics;  Laterality: Right;   TEE WITHOUT CARDIOVERSION N/A 02/23/2021   Procedure: TRANSESOPHAGEAL ECHOCARDIOGRAM (TEE);  Surgeon: Pixie Casino, MD;  Location[facility];  Service: Cardiovascular;  Laterality: N/A;   Social History   Occupational History   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

## 2022-06-09 ENCOUNTER — Encounter: Payer: Self-pay | Admitting: Dietician

## 2022-06-20 ENCOUNTER — Ambulatory Visit: Payer: Medicaid Other | Admitting: Dietician

## 2022-06-20 ENCOUNTER — Other Ambulatory Visit: Payer: Self-pay | Admitting: Dietician

## 2022-06-20 DIAGNOSIS — E1169 Type 2 diabetes mellitus with other specified complication: Secondary | ICD-10-CM

## 2022-06-20 DIAGNOSIS — Z794 Long term (current) use of insulin: Secondary | ICD-10-CM

## 2022-06-20 NOTE — Progress Notes (Signed)
Diabetes Self-Management Education  Visit Type:  Follow-up (#1)  Appt. Start Time: 1315 Appt. End Time: [id number]  06/20/2022  [name], identified by name and date of birth, is a 47 y.o. female with a diagnosis of Diabetes:  .   ASSESSMENT Talking '5mg'$  Byetta twice daily as directed. States she is taking Levemir 100 units twice daily has not missed much since last visit. However, she thinks a vial 1000 units lasts longer than 5 days. We assessed her abdomen where she gives injections and it was red from itching and had red dots where she had injected and a bruise. We discussed proper injection rotation and technique. I also observed her draw up 100 units of insulin which she did mostly correct except she missed putting air in the vial prior to drawing insulin out.   High blood sugar causes itching in crotch. Thick secretions that she says is not a yeast or urinary tract infection. Lori Schmidt is issue to get to amputee support groups. She says she has two cars bet cannot drive and they may be keeping her from getting transportation help.   She says she has cut back on carbs but eats meat, especially red meat per recommendation of the orthopedic doctor. She is trying to eat more meat with breakfast and bedtime snack as well. She mainly eats one large meal a day between 3- 6 Pm. She is not ready to change this.     Diabetes Self-Management Education - 06/20/22 1600       Health Coping   How would you rate your overall health? Fair      Dietary Intake   Breakfast grapes and meat roll up    Lunch chips, grapes    Dinner 2.5 c potatoes, 1/2-1 c veggies, 3 ounces meat.    Snack (evening) hadful of grapes and sometime a meat roll up    Beverage(s) 6 cans die soda a day. 6-8 bottle sof water a day      Patient Education   Previous Diabetes Education Yes (please comment)    Healthy Eating Meal timing in regards to the patients' current diabetes medication.    Medications  Taught/reviewed insulin/injectables, injection, site rotation, insulin/injectables storage and needle disposal.    Monitoring Taught/evaluated CGM (comment)    Acute complications Discussed and identified patients' prevention, symptoms, and treatment of hyperglycemia.      Patient Self-Evaluation of Goals - Patient rates self as meeting previously set goals (% of time)   Nutrition 50 - 75 % (half of the time)      Outcomes   Program Status Not Completed      Subsequent Visit   Since your last visit have you continued or begun to take your medications as prescribed? Yes   says she has been doing better taking her medicines   Since your last visit have you had your blood pressure checked? No    Since your last visit have you experienced any weight changes? --   we decided it was too difficult for her to stand   Since your last visit, are you checking your blood glucose at least once a day? No   her sample dexcom sensor fell off and expired anyway. requesting refills. patient demosntrated ability to apply new sensor today and star wit her phone.            Learning Objective:  Patient will have a greater understanding of diabetes self-management. Patient education plan is  to attend individual and/or group sessions per assessed needs and concerns.   Plan:   Patient Instructions   This is what [name] will work on:   Dexcom refills dispense 3 refills 11 Needs resubmit of semaglutide now that insurance is straightened out. [facility] not [facility] to let her know  I will ask for Levemir pens with short pen needles.  Please try to figure out how much insulin you are going through Injections sites- please inject in healthy tissue around the irritated tissue.   You are due to follow up with our doctor now.   I suggest we follow up 3-4 weeks.   [name] [phone]     Expected Outcomes:  Demonstrated interest in learning but significant barriers to change  Education material  provided: Diabetes Resources  If problems or questions, patient to contact team via:  Phone  Future DSME appointment: - 4-6 wks

## 2022-06-20 NOTE — Telephone Encounter (Signed)
Patient requests prescription for semaglutide be sent to her pharmacy, patient is  tolerating Byetta at 5 mcg twice daily  but it is not helping her blood sugars or appetite. She states that her Insurance is straightened out now and she thinks it will go through.   She would like to use Dexcom G7 and requests refills on the sensors (dispense 3, refills 11) She would also like Levemir pens with short pen needles (4 mm).

## 2022-06-20 NOTE — Patient Instructions (Addendum)
This is what [name] will work on:   Dexcom refills dispense 3 refills 11 Needs resubmit of semaglutide now that insurance is straightened out. [facility] not [facility] to let her know  I will ask for Levemir pens with short pen needles.  Please try to figure out how much insulin you are going through Injections sites- please inject in healthy tissue around the irritated tissue.   You are due to follow up with our doctor now.   I suggest we follow up 3-4 weeks.   [name] [phone]

## 2022-06-21 ENCOUNTER — Encounter: Payer: Medicaid Other | Admitting: Family

## 2022-06-22 ENCOUNTER — Telehealth: Payer: Self-pay

## 2022-06-22 ENCOUNTER — Other Ambulatory Visit: Payer: Self-pay | Admitting: Internal Medicine

## 2022-06-22 DIAGNOSIS — E1169 Type 2 diabetes mellitus with other specified complication: Secondary | ICD-10-CM

## 2022-06-22 MED ORDER — INSULIN PEN NEEDLE 32G X 4 MM MISC: 11 refills | Status: DC

## 2022-06-22 MED ORDER — INSULIN PEN NEEDLE 32G X 4 MM MISC: 1 refills | Status: DC

## 2022-06-22 MED ORDER — ACCU-CHEK GUIDE W/DEVICE KIT: PACK | 1 refills | Status: AC

## 2022-06-22 MED ORDER — DEXCOM G7 SENSOR MISC: 11 refills | Status: DC

## 2022-06-22 MED ORDER — SEMAGLUTIDE(0.25 OR 0.5MG/DOS) 2 MG/3ML ~~LOC~~ SOPN: 0.2500 mg | PEN_INJECTOR | SUBCUTANEOUS | 1 refills | Status: DC

## 2022-06-22 MED ORDER — ACCU-CHEK SOFTCLIX LANCETS MISC: 12 refills | Status: AC

## 2022-06-22 MED ORDER — LEVEMIR FLEXTOUCH 100 UNIT/ML ~~LOC~~ SOPN: 100.0000 [IU] | PEN_INJECTOR | Freq: Two times a day (BID) | SUBCUTANEOUS | 11 refills | Status: DC

## 2022-06-22 MED ORDER — ACCU-CHEK GUIDE VI STRP: ORAL_STRIP | 12 refills | Status: AC

## 2022-06-22 NOTE — Progress Notes (Signed)
Sent in patient's glucometer supplies and her insulin. Of note, she has been taking levemir 100u bid, although was advised to take levemir 50u daily according to the last office visit note from 05/26/22. She checks her blood sugars regularly and has not had any episodes of hypoglycemia, and in fact, upon review of her CBGs since she has had her Dexcom, all of her sugar levels are above target, some as high as 400. Will refill patient's insulin as she has been taking it.

## 2022-06-22 NOTE — Telephone Encounter (Signed)
PA  for pt ( [place] ) came though on cover my meds was submitted with last office notes and labs .Marland Kitchen      UPDATE :   Outcome  Approved today  PA Case: [id number], Status: Approved,   Coverage Starts on: 06/22/2022 12:00:00 AM, Coverage Ends on: 12/19/2022 12:00:00 AM.   Authorization Expiration Date: 12/18/2022   Drug Dexcom G7 Sensor   ePA cloud logo Form [employer] [place] Electronic [place] Form [phone] NCPDP) Original Claim Info ........................................................................... PA for pt ( OZEMPIC 2 ML /3ML )  came through on cover my meds was also submitted with last office notes  and labs ...    Update :   Your request has been approved PA Case: [id number], Status: Approved, Coverage Starts on: 06/22/2022 12:00:00 AM, Coverage Ends on: 06/22/2023 12:00:00 AM.      ( COPY SENT TO PHARMACY AND PLACED TO SCAN TO CHART ALSO )

## 2022-06-23 ENCOUNTER — Telehealth: Payer: Self-pay | Admitting: Dietician

## 2022-06-23 NOTE — Telephone Encounter (Signed)
Let Ms. Cutrona know per her request that her prescriptions were sent per her request. She verbalized understanding.

## 2022-06-23 NOTE — Telephone Encounter (Signed)
harmacy comment: Product Backordered/Unavailable:LEVEMIR IS BEING DISCONTINUED BY MANUFACTURER IN 2024. PLEASE CONSIDER THE POTENTIAL ALTERNATIVE(S) LISTED AND EVALUATE IF APPROPRIATE FOR YOUR PATIENT'S TREATMENT GOALS.   All Pharmacy Suggested Alternatives:  insulin glargine-yfgn (SEMGLEE) 100 UNIT/ML Pen insulin glargine (LANTUS SOLOSTAR) 100 UNIT/ML Solostar Pen Insulin Glargine-aglr (REZVOGLAR KWIKPEN) 100 UNIT/ML SOPN insulin glargine (LANTUS) 100 UNIT/ML injection Insulin Glargine (BASAGLAR KWIKPEN) 100 UNIT/ML

## 2022-06-27 ENCOUNTER — Other Ambulatory Visit: Payer: Self-pay

## 2022-07-12 ENCOUNTER — Telehealth: Payer: Self-pay

## 2022-07-12 ENCOUNTER — Other Ambulatory Visit: Payer: Self-pay

## 2022-07-12 NOTE — Telephone Encounter (Signed)
Prior Authorization for patient (Victoza) came through on cover my meds was submitted with last office notes and labs awaiting approval or denial 

## 2022-07-12 NOTE — Telephone Encounter (Signed)
Decision:Approved Lasandra Beech (Key: [id number]) PA Case ID #: [id number] Rx #: [id number] Need Help? Call us at [phone] Outcome Approved today PA Case: [id number], Status: Approved, Coverage Starts on: 07/12/2022 12:00:00 AM, Coverage Ends on: 07/12/2023 12:00:00 AM. Authorization Expiration Date: 07/11/2023 Drug Victoza 18MG Fayne Mediate pen-injectors ePA cloud logo Form [employer] [employer] Electronic [place] Form [phone] NCPDP) Original Claim Info 75 T/F METFORMIN REQUIRED; SUBMIT PA FOREXCEPTIONFOR 3 DS O/R, USE [facility] [id number] DRUG REQUIRES PRIOR AUTHORIZATION

## 2022-07-12 NOTE — Telephone Encounter (Signed)
Decision:Approved Lori Schmidt (Key: Lori Schmidt) PA Case ID #: [id number] Rx #: [id number] Need Help? Call us at [phone] Outcome Approved today PA Case: [id number], Status: Approved, Coverage Starts on: 07/12/2022 12:00:00 AM, Coverage Ends on: 07/12/2023 12:00:00 AM. Authorization Expiration Date: 07/11/2023 Drug Farxiga 10MG  tablets ePA cloud logo Form [facility] [place] Electronic [place] Form [phone] NCPDP) Original Claim Info 19

## 2022-07-12 NOTE — Telephone Encounter (Signed)
Prior Authorization for patient (Farxiga) came through on cover my meds was submitted with last office notes and labs awaiting approval or denial 

## 2022-07-18 ENCOUNTER — Ambulatory Visit: Payer: Medicaid Other | Admitting: Dietician

## 2022-07-18 DIAGNOSIS — Z794 Long term (current) use of insulin: Secondary | ICD-10-CM

## 2022-07-18 DIAGNOSIS — E1169 Type 2 diabetes mellitus with other specified complication: Secondary | ICD-10-CM | POA: Diagnosis not present

## 2022-07-18 NOTE — Patient Instructions (Addendum)
Congrats on lowering your blood sugars!!!  Your A1c is due on 08/17/22.   You can make a doctor appointment at the front desk.  Think about a basal bolus insulin regimen- Basal insulin like Tresiba insulin and Novolog before meal(s)  Call Dexcom support for a replacement sensor.  Call pharmacy for [facility] sensor refills.   Depo due will be 08/18/22 - 09/01/22; dates given to pt.  Ozempic doses: 0.25mg , 0.5mg   then 1 mg and 2 mg.  Let's follow up same day  you see the doctor.  Lupita Leash [phone]   .

## 2022-07-18 NOTE — Progress Notes (Signed)
Diabetes Self-Management Education  Visit Type:  Follow-up (#2)  Appt. Start Time: 1530 Appt. End Time: 1615  07/18/2022  [name], identified by name and date of birth, is a 47 y.o. female with a diagnosis of Diabetes:  .   ASSESSMENT  Reviewed Continuous glucose monitoring report with patient.   CGM Results from download: 3/26-4/8  % Time CGM active:   98.4 %   (Goal >70%)  Average glucose:   195 mg/dL for 14 days  Glucose management indicator:   8 %  Time in range (70-180 mg/dL):   51 %   (Goal >38%)  Time High (181-250 mg/dL):   27 %   (Goal < 46%)  Time Very High (>250 mg/dL):    22 %   (Goal < 5%)  Time Low (54-69 mg/dL):   0 %   (Goal <6%)  Time Very Low (<54 mg/dL):   0 %   (Goal <5%)  Coefficient of variation:   36.6 %   (Goal <36%)         Diabetes Self-Management Education - 07/18/22 1600       Health Coping   How would you rate your overall health? Good   1 more month befpore she can maybe get her prosthesis, "blood sugar is much better" "better than it has been in a while"     Complications   How often do you check your blood sugar? > 4 times/day    Fasting Blood glucose range (mg/dL) 99-357;017-793    Postprandial Blood glucose range (mg/dL) >903;009-233    Number of hypoglycemic episodes per month 0    Number of hyperglycemic episodes ( >200mg /dL): Daily    Can you tell when your blood sugar is high? No   would like to get more detail about this   Have you had a dilated eye exam in the past 12 months? No   wants to wait until she can drive   Have you had a dental exam in the past 12 months? Yes    Are you checking your feet? N/A      Dietary Intake   Dinner eating less potatoe    Beverage(s) has realized that diet soda does not affect her blood sugar      Patient Education   Medications Reviewed patients medication for diabetes, action, purpose, timing of dose and side effects.    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 Yes   between 100-200 u levemir daily(often skips her PM shot now)   Since your last visit have you had your blood pressure checked? No    Since your last visit have you experienced any weight changes? --   cannot weigh until she gets prosthesis   Since your last visit, are you checking your blood glucose at least once a day? Yes   using dexcom and loves it!  Learning Objective:  Patient will have a greater understanding of diabetes self-management. Patient education plan is to attend individual and/or group sessions per assessed needs and concerns.   Plan:   Patient Instructions   Congrats on lowering your blood sugars!!!  Your A1c is due on 08/17/22.   You can make a doctor appointment at the front desk.  Think about a basal bolus insulin regimen- Basal insulin like Tresiba insulin and Novolog before meal(s)  Call Dexcom support for a replacement sensor.  Call pharmacy for [facility] sensor refills.   Depo due will be 08/18/22 - 09/01/22; dates given to pt.  Ozempic doses: 0.25mg , 0.5mg   then 1 mg and 2 mg.  Let's follow up same day  you see the doctor.  Lori Schmidt [phone]   .    Expected Outcomes:  Demonstrated interest in learning but significant barriers to change  Education material provided: Diabetes Resources  If problems or questions, patient to contact team via:  Phone and Email  Future DSME appointment: - 4-6 wks Norm Parcel, RD 07/18/2022 4:45 PM.

## 2022-08-16 ENCOUNTER — Other Ambulatory Visit: Payer: Self-pay | Admitting: Internal Medicine

## 2022-08-16 ENCOUNTER — Other Ambulatory Visit: Payer: Self-pay | Admitting: Physician Assistant

## 2022-08-16 ENCOUNTER — Other Ambulatory Visit: Payer: Self-pay | Admitting: Orthopedic Surgery

## 2022-08-17 MED ORDER — CITALOPRAM HYDROBROMIDE 40 MG PO TABS: 40.0000 mg | ORAL_TABLET | Freq: Every day | ORAL | 11 refills | Status: DC

## 2022-08-17 MED ORDER — PRAVASTATIN SODIUM 40 MG PO TABS: 40.0000 mg | ORAL_TABLET | Freq: Every day | ORAL | 11 refills | Status: DC

## 2022-08-22 ENCOUNTER — Ambulatory Visit (INDEPENDENT_AMBULATORY_CARE_PROVIDER_SITE_OTHER): Payer: Medicaid Other | Admitting: Dietician

## 2022-08-22 ENCOUNTER — Encounter: Payer: Self-pay | Admitting: Student

## 2022-08-22 ENCOUNTER — Ambulatory Visit: Payer: Medicaid Other | Admitting: Student

## 2022-08-22 VITALS — BP 126/69 | HR 106 | Temp 98.1°F | Ht 67.0 in | Wt 293.0 lb

## 2022-08-22 DIAGNOSIS — Z3042 Encounter for surveillance of injectable contraceptive: Secondary | ICD-10-CM | POA: Diagnosis not present

## 2022-08-22 DIAGNOSIS — D509 Iron deficiency anemia, unspecified: Secondary | ICD-10-CM

## 2022-08-22 DIAGNOSIS — Z794 Long term (current) use of insulin: Secondary | ICD-10-CM | POA: Diagnosis not present

## 2022-08-22 DIAGNOSIS — D649 Anemia, unspecified: Secondary | ICD-10-CM

## 2022-08-22 DIAGNOSIS — E785 Hyperlipidemia, unspecified: Secondary | ICD-10-CM | POA: Diagnosis not present

## 2022-08-22 DIAGNOSIS — Z0000 Encounter for general adult medical examination without abnormal findings: Secondary | ICD-10-CM

## 2022-08-22 DIAGNOSIS — Z23 Encounter for immunization: Secondary | ICD-10-CM | POA: Diagnosis not present

## 2022-08-22 DIAGNOSIS — E1169 Type 2 diabetes mellitus with other specified complication: Secondary | ICD-10-CM | POA: Diagnosis not present

## 2022-08-22 DIAGNOSIS — F419 Anxiety disorder, unspecified: Secondary | ICD-10-CM

## 2022-08-22 DIAGNOSIS — E1142 Type 2 diabetes mellitus with diabetic polyneuropathy: Secondary | ICD-10-CM

## 2022-08-22 HISTORY — DX: Anemia, unspecified: D64.9

## 2022-08-22 MED ORDER — MEDROXYPROGESTERONE ACETATE 104 MG/0.65ML ~~LOC~~ SUSY
104.0000 mg | PREFILLED_SYRINGE | Freq: Once | SUBCUTANEOUS | Status: AC
Start: 2022-08-22 — End: 2022-08-22
  Administered 2022-08-22: 104 mg via SUBCUTANEOUS

## 2022-08-22 MED ORDER — PRAVASTATIN SODIUM 40 MG PO TABS: 40.0000 mg | ORAL_TABLET | Freq: Every day | ORAL | 11 refills | Status: DC

## 2022-08-22 MED ORDER — SEMAGLUTIDE (1 MG/DOSE) 4 MG/3ML ~~LOC~~ SOPN
1.0000 mg | PEN_INJECTOR | SUBCUTANEOUS | 2 refills | Status: AC
Start: 2022-08-22 — End: 2022-11-20

## 2022-08-22 MED ORDER — CITALOPRAM HYDROBROMIDE 40 MG PO TABS: 40.0000 mg | ORAL_TABLET | Freq: Every day | ORAL | 11 refills | Status: DC

## 2022-08-22 MED ORDER — PREGABALIN 100 MG PO CAPS
100.0000 mg | ORAL_CAPSULE | Freq: Three times a day (TID) | ORAL | 2 refills | Status: DC
Start: 2022-08-22 — End: 2022-10-24

## 2022-08-22 NOTE — Progress Notes (Signed)
Diabetes Self-Management Education  Visit Type:  Follow-up (#3)  Appt. Start Time: 1445 Appt. End Time: 1525  08/22/2022  Lori Schmidt, identified by name and date of birth, is a 47 y.o. female with a diagnosis of Diabetes:  .   ASSESSMENT Estimated body mass index is 45.89 kg/m as calculated from the following:   Height as of an earlier encounter on 08/22/22: 5\' 7"  (1.702 m).   Weight as of an earlier encounter on 08/22/22: 293 lb (132.9 kg). Wt Readings from Last 10 Encounters:  08/22/22 293 lb (132.9 kg)  05/19/22 258 lb 4.8 oz (117.2 kg)  05/13/22 251 lb 5.2 oz (114 kg)  04/21/22 254 lb 6.4 oz (115.4 kg)  04/15/22 250 lb (113.4 kg)  03/22/22 260 lb (117.9 kg)  02/25/22 272 lb 11.2 oz (123.7 kg)  12/01/21 270 lb (122.5 kg)  11/17/21 273 lb 11.2 oz (124.1 kg)  11/10/21 274 lb 12.8 oz (124.6 kg)   Lab Results  Component Value Date   HGBA1C 9.5 (A) 05/19/2022   HGBA1C 12.2 (A) 02/25/2022   HGBA1C 10.1 (H) 10/18/2021    Blood sugars much improved. However, she had some lows that preceded highs. She is increasing her levemir to cover food. We discussed today why this is not recommended. She should need less insulin soon with her plan to decrease her calories for weight loss and increased semaglutide.  CGM Results from download:   % Time CGM active:   99.5 %   (Goal >70%)  Average glucose:   162 mg/dL for 14 days  Glucose management indicator:   7.2 %  Time in range (70-180 mg/dL):   65 %   (Goal >16%)  Time High (181-250 mg/dL):   29 %   (Goal < 10%)  Time Very High (>250 mg/dL):    6 %   (Goal < 5%)  Time Low (54-69 mg/dL):   0 %   (Goal <9%)  Time Very Low (<54 mg/dL):   <1 %   (Goal <6%)  Coefficient of variation:   33.1 %   (Goal <36%)       Diabetes Self-Management Education - 08/22/22 1500       Psychosocial Assessment   What is the hardest part about your diabetes right now, causing you the most concern, or is the most worrisome to you about your diabetes?    Other (comment)   walking with two prosthesis   Self-care barriers Debilitated state due to current medical condition;Lack of transportation;Unsteady gait/risk for falls    Patient Concerns Glycemic Control      Dietary Intake   Dinner eats lot's of vegetables      Activity / Exercise   Activity / Exercise Type ADL's      Patient Education   Disease Pathophysiology Other (comment)   explained basal bolus therapy and action and purposes of different types of insulin   Healthy Eating Information on hints to eating out and maintain blood glucose control.   discussed importance of eating healthy amounts of fruits, veggies, whole grains and protein during weight loss.   Medications Reviewed patients medication for diabetes, action, purpose, timing of dose and side effects.    Monitoring Identified appropriate SMBG and/or A1C goals.    Acute complications Taught prevention, symptoms, and  treatment of hypoglycemia - the 15 rule.   goals for CGM are >70% in target and average betwween 150-160 mg/dL     Subsequent Visit   Since your last visit  have you continued or begun to take your medications as prescribed? Yes   sometimes adds more for larger meals   Since your last visit have you experienced any weight changes? Gain    Weight Gain (lbs) 50             Learning Objective:  Patient will have a greater understanding of diabetes self-management. Patient education plan is to attend individual and/or group sessions per assessed needs and concerns.   Plan:   Patient Instructions  Hypoglycemia Hypoglycemia is when the sugar (glucose) level in your blood is too low. Low blood sugar can happen to people who have diabetes and people who do not have diabetes. Low blood sugar can happen quickly, and it can be an emergency. What are the causes? This condition happens most often in people who have diabetes. It may be caused by: Diabetes medicine. Not eating enough, or not eating often  enough. Doing more physical activity. Drinking alcohol on an empty stomach.  What increases the risk? This condition is more likely to develop in people who: Have diabetes and take medicines to lower their blood sugar. Have a very bad illness. What are the signs or symptoms? Mild Hunger. Sweating and feeling clammy. Feeling dizzy or light-headed. Being sleepy or having trouble sleeping. Feeling like you may vomit (nauseous). A fast heartbeat. A headache. Blurry vision. Mood changes, such as: Being grouchy. Feeling worried or nervous (anxious). Tingling or loss of feeling (numbness) around your mouth, lips, or tongue. Moderate Confusion and poor judgment. Behavior changes. Weakness. Uneven heartbeat. Trouble with moving (coordination). Very low Very low blood sugar (severe hypoglycemia) is a medical emergency. It can cause: Fainting. Seizures. Loss of consciousness (coma). Death. How is this treated? Treating low blood sugar Low blood sugar is often treated by eating or drinking something that has sugar in it right away. The food or drink should contain 15 grams of a fast-acting carb (carbohydrate). Options include: 4 oz (120 mL) of fruit juice. 4 oz (120 mL) of regular soda (not diet soda). A few pieces of hard candy. Check food labels to see how many pieces to eat for 15 grams. 1 Tbsp (15 mL) of sugar or honey. 4 glucose tablets. 1 tube of glucose gel. Treating low blood sugar if you have diabetes If you can think clearly and swallow safely, follow the 15:15 rule: Take 15 grams of a fast-acting carb. Talk with your doctor about how much you should take. Always keep a source of fast-acting carb with you, such as: Glucose tablets (take 4 tablets). A few pieces of hard candy. Check food labels to see how many pieces to eat for 15 grams. 4 oz (120 mL) of fruit juice. 4 oz (120 mL) of regular soda (not diet soda). 1 Tbsp (15 mL) of honey or sugar. 1 tube of glucose  gel. Check your blood sugar 15 minutes after you take the carb. If your blood sugar is still at or below 70 mg/dL (3.9 mmol/L), take 15 grams of a carb again. If your blood sugar does not go above 70 mg/dL (3.9 mmol/L) after 3 tries, get help right away. After your blood sugar goes back to normal, eat a meal or a snack within 1 hour.  Treating very low blood sugar If your blood sugar is below 54 mg/dL (3 mmol/L), you have very low blood sugar, or severe hypoglycemia. This is an emergency. Get medical help right away. If you have very low blood sugar and you  cannot eat or drink, you will need to be given a hormone called glucagon. A family member or friend should learn how to check your blood sugar and how to give you glucagon. Ask your doctor if you need to have an emergency glucagon kit at home. Very low blood sugar may also need to be treated in a hospital. Follow these instructions at home: General instructions Take over-the-counter and prescription medicines only as told by your doctor. Stay aware of your blood sugar as told by your doctor. If you drink alcohol: Limit how much you have to: 0-1 drink a day for women who are not pregnant. 0-2 drinks a day for men. Know how much alcohol is in your drink. In the [place] one drink equals one 12 oz bottle of beer (355 mL), one 5 oz glass of wine (148 mL), or one 1 oz glass of hard liquor (44 mL). Be sure to eat food when you drink alcohol. Know that your body absorbs alcohol quickly. This may lead to low blood sugar later. Be sure to keep checking your blood sugar. Keep all follow-up visits. If you have diabetes:  Always have a fast-acting carb (15 grams) with you to treat low blood sugar. Follow your diabetes care plan as told by your doctor. Make sure you: Know the symptoms of low blood sugar. Check your blood sugar as often as told. Always check it before and after exercise. Always check your blood sugar before you drive. Take your  medicines as told. Follow your meal plan. Eat on time. Do not skip meals. Share your diabetes care plan with: Your work or school. People you live with. Carry a card or wear jewelry that says you have diabetes. Where to find more information American Diabetes Association: www.diabetes.org Contact a doctor if: You have trouble keeping your blood sugar in your target range. You have low blood sugar often. Get help right away if: You still have symptoms after you eat or drink something that contains 15 grams of fast-acting carb, and you cannot get your blood sugar above 70 mg/dL by following the 04:54 rule. Your blood sugar is below 54 mg/dL (3 mmol/L). You have a seizure. You faint. These symptoms may be an emergency. Get help right away. Call your local emergency services (911 in the [place] Do not wait to see if the symptoms will go away. Do not drive yourself to the hospital. Summary Hypoglycemia happens when the level of sugar (glucose) in your blood is too low. Low blood sugar can happen to people who have diabetes and people who do not have diabetes. Low blood sugar can happen quickly, and it can be an emergency. Make sure you know the symptoms of low blood sugar and know how to treat it. Always keep a source of sugar (fast-acting carb) with you     Please make a follow up in the same day you see the doctor in 1 month.  Lupita Leash [phone]    Expected Outcomes:  Demonstrated interest in learning. Expect positive outcomes  Education material provided: Diabetes Resources  If problems or questions, patient to contact team via:  Phone  Future DSME appointment: -  4 weeks Norm Parcel, RD 08/22/2022 3:30 PM.

## 2022-08-22 NOTE — Assessment & Plan Note (Signed)
Patient compliant on her pravastatin 40 mg daily. She is tolerating this well.   > Pravastatin 40 mg

## 2022-08-22 NOTE — Assessment & Plan Note (Signed)
Patient is tolerating her Lyrica well. She is requesting refill today.   > Lyrica 100 mg TID

## 2022-08-22 NOTE — Assessment & Plan Note (Addendum)
Patient declined pap smear and colonoscopy today. 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 She is open to receiving pap smear at next visit. Will provide Tdap today.   > Tdap

## 2022-08-22 NOTE — Assessment & Plan Note (Signed)
Patient due for her Depo-Provera injection today.  > Depo shot

## 2022-08-22 NOTE — Assessment & Plan Note (Deleted)
Patient requesting refill of her citalopram today. States she is tolerating this well.  > citalopram 40 mg

## 2022-08-22 NOTE — Patient Instructions (Signed)
Hypoglycemia Hypoglycemia is when the sugar (glucose) level in your blood is too low. Low blood sugar can happen to people who have diabetes and people who do not have diabetes. Low blood sugar can happen quickly, and it can be an emergency. What are the causes? This condition happens most often in people who have diabetes. It may be caused by: Diabetes medicine. Not eating enough, or not eating often enough. Doing more physical activity. Drinking alcohol on an empty stomach.  What increases the risk? This condition is more likely to develop in people who: Have diabetes and take medicines to lower their blood sugar. Have a very bad illness. What are the signs or symptoms? Mild Hunger. Sweating and feeling clammy. Feeling dizzy or light-headed. Being sleepy or having trouble sleeping. Feeling like you may vomit (nauseous). A fast heartbeat. A headache. Blurry vision. Mood changes, such as: Being grouchy. Feeling worried or nervous (anxious). Tingling or loss of feeling (numbness) around your mouth, lips, or tongue. Moderate Confusion and poor judgment. Behavior changes. Weakness. Uneven heartbeat. Trouble with moving (coordination). Very low Very low blood sugar (severe hypoglycemia) is a medical emergency. It can cause: Fainting. Seizures. Loss of consciousness (coma). Death. How is this treated? Treating low blood sugar Low blood sugar is often treated by eating or drinking something that has sugar in it right away. The food or drink should contain 15 grams of a fast-acting carb (carbohydrate). Options include: 4 oz (120 mL) of fruit juice. 4 oz (120 mL) of regular soda (not diet soda). A few pieces of hard candy. Check food labels to see how many pieces to eat for 15 grams. 1 Tbsp (15 mL) of sugar or honey. 4 glucose tablets. 1 tube of glucose gel. Treating low blood sugar if you have diabetes If you can think clearly and swallow safely, follow the 15:15 rule: Take  15 grams of a fast-acting carb. Talk with your doctor about how much you should take. Always keep a source of fast-acting carb with you, such as: Glucose tablets (take 4 tablets). A few pieces of hard candy. Check food labels to see how many pieces to eat for 15 grams. 4 oz (120 mL) of fruit juice. 4 oz (120 mL) of regular soda (not diet soda). 1 Tbsp (15 mL) of honey or sugar. 1 tube of glucose gel. Check your blood sugar 15 minutes after you take the carb. If your blood sugar is still at or below 70 mg/dL (3.9 mmol/L), take 15 grams of a carb again. If your blood sugar does not go above 70 mg/dL (3.9 mmol/L) after 3 tries, get help right away. After your blood sugar goes back to normal, eat a meal or a snack within 1 hour.  Treating very low blood sugar If your blood sugar is below 54 mg/dL (3 mmol/L), you have very low blood sugar, or severe hypoglycemia. This is an emergency. Get medical help right away. If you have very low blood sugar and you cannot eat or drink, you will need to be given a hormone called glucagon. A family member or friend should learn how to check your blood sugar and how to give you glucagon. Ask your doctor if you need to have an emergency glucagon kit at home. Very low blood sugar may also need to be treated in a hospital. Follow these instructions at home: General instructions Take over-the-counter and prescription medicines only as told by your doctor. Stay aware of your blood sugar as told by your  doctor. If you drink alcohol: Limit how much you have to: 0-1 drink a day for women who are not pregnant. 0-2 drinks a day for men. Know how much alcohol is in your drink. In the [place] one drink equals one 12 oz bottle of beer (355 mL), one 5 oz glass of wine (148 mL), or one 1 oz glass of hard liquor (44 mL). Be sure to eat food when you drink alcohol. Know that your body absorbs alcohol quickly. This may lead to low blood sugar later. Be sure to keep checking  your blood sugar. Keep all follow-up visits. If you have diabetes:  Always have a fast-acting carb (15 grams) with you to treat low blood sugar. Follow your diabetes care plan as told by your doctor. Make sure you: Know the symptoms of low blood sugar. Check your blood sugar as often as told. Always check it before and after exercise. Always check your blood sugar before you drive. Take your medicines as told. Follow your meal plan. Eat on time. Do not skip meals. Share your diabetes care plan with: Your work or school. People you live with. Carry a card or wear jewelry that says you have diabetes. Where to find more information American Diabetes Association: www.diabetes.org Contact a doctor if: You have trouble keeping your blood sugar in your target range. You have low blood sugar often. Get help right away if: You still have symptoms after you eat or drink something that contains 15 grams of fast-acting carb, and you cannot get your blood sugar above 70 mg/dL by following the 14:78 rule. Your blood sugar is below 54 mg/dL (3 mmol/L). You have a seizure. You faint. These symptoms may be an emergency. Get help right away. Call your local emergency services (911 in the [place] Do not wait to see if the symptoms will go away. Do not drive yourself to the hospital. Summary Hypoglycemia happens when the level of sugar (glucose) in your blood is too low. Low blood sugar can happen to people who have diabetes and people who do not have diabetes. Low blood sugar can happen quickly, and it can be an emergency. Make sure you know the symptoms of low blood sugar and know how to treat it. Always keep a source of sugar (fast-acting carb) with you     Please make a follow up in the same day you see the doctor in 1 month.  Lori Schmidt [phone]

## 2022-08-22 NOTE — Progress Notes (Signed)
Subjective:   Patient ID: Lori Schmidt female   DOB: Jul 23, 1975 47 y.o.   MRN: [id number]  HPI: [name] is a 47 y.o. female with a PMHx as presented below who presents for follow up on her type II DM. Please see problem based assessment and plan for further work up and management.   Patient Active Problem List   Diagnosis Date Noted   Anemia 08/22/2022   S/P BKA (below knee amputation) unilateral, right (HCC) 03/24/2022   DKA, type 2 (HCC) 03/23/2022   Health care maintenance 02/25/2022   Venous insufficiency of right leg 12/01/2021   Encounter for birth control 11/10/2021   Anxiety 10/26/2021   Abscess of skin and subcutaneous tissue 10/16/2021   Dyslipidemia    Diabetic peripheral neuropathy (HCC)    S/P BKA (below knee amputation) unilateral, left (HCC) 03/05/2021   Adjustment disorder with mixed anxiety and depressed mood    Diabetes mellitus (HCC)    Morbid obesity with BMI of 40.0-44.9, adult (HCC) 01/05/2012     Current Outpatient Medications  Medication Sig Dispense Refill   Semaglutide, 1 MG/DOSE, 4 MG/3ML SOPN Inject 1 mg into the skin once a week. 3 mL 2   Accu-Chek Softclix Lancets lancets Use to check blood sugar up to 3 times daily as needed 100 each 12   acetaminophen (TYLENOL) 325 MG tablet Take 1-2 tablets (325-650 mg total) by mouth every 6 (six) hours as needed for mild pain (pain score 1-3 or temp > 100.5).     amoxicillin-clavulanate (AUGMENTIN) 875-125 MG tablet Take 1 tablet by mouth 2 (two) times daily. (Patient taking differently: Take 1 tablet by mouth daily.) 30 tablet 0   Blood Glucose Monitoring Suppl (ACCU-CHEK GUIDE) w/Device KIT Use to check blood sugar up to 3 times daily as needed 1 kit 1   ciprofloxacin (CIPRO) 500 MG tablet Take 1 tablet (500 mg total) by mouth 2 (two) times daily. 30 tablet 0   citalopram (CELEXA) 40 MG tablet Take 1 tablet (40 mg total) by mouth at bedtime. 30 tablet 11   Continuous Blood Gluc  Sensor (DEXCOM G7 SENSOR) MISC As directed 3 each 11   glucose blood (ACCU-CHEK GUIDE) test strip Check blood sugar up to 3 times daily as needed 100 each 12   insulin glargine (LANTUS) 100 UNIT/ML Solostar Pen Inject 100 Units into the skin 2 (two) times daily. 60 mL 11   Insulin Pen Needle 32G X 4 MM MISC Use twice daily to inject insulin 120 each 11   pravastatin (PRAVACHOL) 40 MG tablet Take 1 tablet (40 mg total) by mouth daily. 30 tablet 11   pregabalin (LYRICA) 100 MG capsule Take 1 capsule (100 mg total) by mouth 3 (three) times daily. 90 capsule 2   No current facility-administered medications for this visit.     Review of Systems: Pertinent ROS listed in the A&P. Otherwise negative.   Objective:   Physical Exam: Vitals:   08/22/22 1339  BP: 126/69  Pulse: (!) 106  Temp: 98.1 F (36.7 C)  TempSrc: Oral  SpO2: 98%  Weight: 293 lb (132.9 kg)  Height: 5\' 7"  (1.702 m)   Physical Exam Constitutional:      Appearance: Normal appearance.  HENT:     Head: Normocephalic and atraumatic.  Cardiovascular:     Rate and Rhythm: Normal rate and regular rhythm.  Pulmonary:     Effort: Pulmonary effort is normal.     Breath sounds:  Normal breath sounds.  Skin:    General: Skin is warm and dry.  Neurological:     General: No focal deficit present.     Mental Status: She is alert and oriented to person, place, and time.  Psychiatric:        Mood and Affect: Mood normal.        Behavior: Behavior normal.      Assessment & Plan:   Diabetes mellitus (HCC) Pt here for follow up regarding her diabetes. Current medications include lantus 100 units twice daily, Ozempic 0.5 mg weekly injections, and Farxiga 10 mg daily. A1c is pending as patient requests it be checked via blood draw. CGM reveals average blood glucose of 161. She has been in her target range 66% of the time and above goal 34% of the time. Highs appear to be in the evening. States she does tend to snack on ice cream  around this time. She has had about 4 hypoglycemic episodes since her last visit. During these episodes she feels tremulous and symptoms quickly resolve after snacking. These episodes seem to occur around 0900 in the morning. Patient notes she occasionally takes an extra 40 units of her Lantus in the afternoon if she feels she will eat a lot. Suspect this may be the cause of some of her morning lows. She was advised against this today. She usually does not eat 3 meals a day, but rather 1 large meal. Plan to increase Ozempic from 0.5 mg to 1 mg today to facilitate better blood sugar control. Additionally, patient notes no weight loss after being on the injections for the past month, so the increase should help with this. Will discontinue Marcelline Deist given patient's history of DKA and bilateral BKA. Potassium was 3.6 in December. Will reassess BMP today.   > lantus 100 units BID > Increase Ozempic to 1 mg weekly injections > Discontinue Farxiga 10 mg  > A1c > BMP  Encounter for birth control Patient due for her Depo-Provera injection today.  > Depo shot  Health care maintenance Patient declined pap smear and colonoscopy today. 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 She is open to receiving pap smear at next visit. Will provide Tdap today.   > Tdap   Dyslipidemia Patient compliant on her pravastatin 40 mg daily. She is tolerating this well.   > Pravastatin 40 mg  Diabetic peripheral neuropathy [facility]) Patient is tolerating her Lyrica well. She is requesting refill today.   > Lyrica 100 mg TID  Anemia Patient found to have hemoglobin of 7.7 in December after hospitalization for subacute osteomyelitis of the right foot with subsequent R BKA. Will repeat blood work today to monitor her improvement and evaluate for any signs of ongoing anemia.   > hemoglobin A1c > Iron, TIBC, Ferritin > CBC without diff

## 2022-08-22 NOTE — Assessment & Plan Note (Signed)
Patient found to have hemoglobin of 7.7 in December after hospitalization for subacute osteomyelitis of the right foot with subsequent R BKA. Will repeat blood work today to monitor her improvement and evaluate for any signs of ongoing anemia.   > hemoglobin A1c > Iron, TIBC, Ferritin > CBC without diff

## 2022-08-22 NOTE — Patient Instructions (Addendum)
Lori Schmidt,  It was nice seeing you in the clinic today.  Here is a summary of what talked about:  1.  For your diabetes, please continue Lantus 100 units 2 times daily.  Please do not give any extra insulin.  I increase your Ozempic to 1 mg weekly.  2.  Please stop taking [place].  3.  I will recheck your blood count and also your kidney function.  Please return in 4 weeks for DEXA, recheck  Dr. Cyndie Chime

## 2022-08-22 NOTE — Assessment & Plan Note (Addendum)
Pt here for follow up regarding her diabetes. Current medications include lantus 100 units twice daily, Ozempic 0.5 mg weekly injections, and Farxiga 10 mg daily. A1c is pending as patient requests it be checked via blood draw. CGM reveals average blood glucose of 161. She has been in her target range 66% of the time and above goal 34% of the time. Highs appear to be in the evening. States she does tend to snack on ice cream around this time.  Patient typically only eats 1 large meal a day in the afternoon which explain her higher values later in the day.  She has had about 4 hypoglycemic episodes since her last visit. During these episodes she feels tremulous and symptoms quickly resolve after snacking. These episodes seem to occur around 0900 in the morning. Patient notes she occasionally takes an extra 40 units of her Lantus in the afternoon if she feels she will eat a lot. Suspect this may be the cause of some of her morning lows. She was advised against this today. She usually does not eat 3 meals a day, but rather 1 large meal.   Plan to increase Ozempic from 0.5 mg to 1 mg today to facilitate better postprandial blood sugar control. Additionally, patient notes no weight loss after being on the injections for the past month, so the increase should help with this. Will discontinue Marcelline Deist given patient's history of DKA and bilateral BKA. Potassium was 3.6 in December. Will reassess BMP today.   > lantus 100 units BID > Increase Ozempic to 1 mg weekly injections > Discontinue Farxiga 10 mg  > A1c > BMP

## 2022-08-23 LAB — BMP8+ANION GAP
Anion Gap: 21 mmol/L — ABNORMAL HIGH (ref 10.0–18.0)
BUN/Creatinine Ratio: 24 — ABNORMAL HIGH (ref 9–23)
BUN: 14 mg/dL (ref 6–24)
CO2: 18 mmol/L — ABNORMAL LOW (ref 20–29)
Calcium: 9.1 mg/dL (ref 8.7–10.2)
Chloride: 105 mmol/L (ref 96–106)
Creatinine, Ser: 0.58 mg/dL (ref 0.57–1.00)
Glucose: 175 mg/dL — ABNORMAL HIGH (ref 70–99)
Potassium: 4.3 mmol/L (ref 3.5–5.2)
Sodium: 144 mmol/L (ref 134–144)
eGFR: 112 mL/min/{1.73_m2} (ref >59)

## 2022-08-23 LAB — IRON,TIBC AND FERRITIN PANEL
Ferritin: 115 ng/mL (ref 15–150)
Iron Saturation: 8 % — CL (ref 15–55)
Iron: 33 ug/dL (ref 27–159)
Total Iron Binding Capacity: 401 ug/dL (ref 250–450)
UIBC: 368 ug/dL (ref 131–425)

## 2022-08-23 LAB — RETICULOCYTES: Retic Ct Pct: 1.5 % (ref 0.6–2.6)

## 2022-08-23 LAB — CBC
Hematocrit: 38.7 % (ref 34.0–46.6)
Hemoglobin: 12.5 g/dL (ref 11.1–15.9)
MCH: 25 pg — ABNORMAL LOW (ref 26.6–33.0)
MCHC: 32.3 g/dL (ref 31.5–35.7)
MCV: 77 fL — ABNORMAL LOW (ref 79–97)
Platelets: 309 10*3/uL (ref 150–450)
RBC: 5 x10E6/uL (ref 3.77–5.28)
RDW: 16.2 % — ABNORMAL HIGH (ref 11.7–15.4)
WBC: 9.1 10*3/uL (ref 3.4–10.8)

## 2022-08-23 LAB — HEMOGLOBIN A1C
Est. average glucose Bld gHb Est-mCnc: 192 mg/dL
Hgb A1c MFr Bld: 8.3 % — ABNORMAL HIGH (ref 4.8–5.6)

## 2022-08-23 MED ORDER — FERROUS SULFATE 325 (65 FE) MG PO TABS
325.0000 mg | ORAL_TABLET | ORAL | 3 refills | Status: DC
Start: 2022-08-23 — End: 2023-10-11

## 2022-08-23 NOTE — Addendum Note (Signed)
Addended byDoran Stabler on: 08/23/2022 01:54 PM   Modules accepted: Orders

## 2022-08-25 NOTE — Progress Notes (Signed)
Internal Medicine Clinic Attending  Case discussed with Dr. Cyndie Chime  At the time of the visit.  We reviewed the resident's history and exam and pertinent patient test results.  I agree with the assessment, diagnosis, and plan of care documented in the resident's note.  At next visit, recommend review of menstrual history to elicit whether this may be contributing to iron deficiency. Hopefully, Depo-Provera injections are helping with this. Referral to GI for evaluation if no clear alternative etiology.

## 2022-09-15 ENCOUNTER — Other Ambulatory Visit: Payer: Self-pay | Admitting: Student

## 2022-09-15 DIAGNOSIS — F419 Anxiety disorder, unspecified: Secondary | ICD-10-CM

## 2022-09-22 ENCOUNTER — Other Ambulatory Visit: Payer: Self-pay

## 2022-09-22 ENCOUNTER — Ambulatory Visit: Payer: Medicaid Other

## 2022-09-22 ENCOUNTER — Ambulatory Visit (INDEPENDENT_AMBULATORY_CARE_PROVIDER_SITE_OTHER): Payer: Medicaid Other | Admitting: Dietician

## 2022-09-22 VITALS — BP 123/73 | HR 111 | Temp 98.3°F | Ht 67.0 in | Wt 295.8 lb

## 2022-09-22 DIAGNOSIS — Z794 Long term (current) use of insulin: Secondary | ICD-10-CM | POA: Diagnosis not present

## 2022-09-22 DIAGNOSIS — E1169 Type 2 diabetes mellitus with other specified complication: Secondary | ICD-10-CM | POA: Diagnosis not present

## 2022-09-22 DIAGNOSIS — E1110 Type 2 diabetes mellitus with ketoacidosis without coma: Secondary | ICD-10-CM | POA: Diagnosis not present

## 2022-09-22 DIAGNOSIS — R142 Eructation: Secondary | ICD-10-CM | POA: Insufficient documentation

## 2022-09-22 DIAGNOSIS — Z7985 Long-term (current) use of injectable non-insulin antidiabetic drugs: Secondary | ICD-10-CM

## 2022-09-22 LAB — BASIC METABOLIC PANEL
Anion gap: 13 (ref 5–15)
BUN: 19 mg/dL (ref 6–20)
CO2: 22 mmol/L (ref 22–32)
Calcium: 8.4 mg/dL — ABNORMAL LOW (ref 8.9–10.3)
Chloride: 105 mmol/L (ref 98–111)
Creatinine, Ser: 0.58 mg/dL (ref 0.44–1.00)
GFR, Estimated: >60 mL/min (ref >60)
Glucose, Bld: 201 mg/dL — ABNORMAL HIGH (ref 70–99)
Potassium: 3.5 mmol/L (ref 3.5–5.1)
Sodium: 140 mmol/L (ref 135–145)

## 2022-09-22 NOTE — Patient Instructions (Addendum)
Thank you for coming to see [place] in clinic Lori Schmidt.  Plan:  - Please continue taking:     - lantus 100 units twice daily and Semaglutide 1 mg weekly for your diabetes  - We got the following labs today and will call you with the results:     - BMP to check your electrolytes and kidney function  - Your next [place] injection will be in 2 months when we check your A1c  Return Precautions: If you develop nausea, lightheadedness, blurry vision, loss of consciousness, falls, please call our clinic and visit the emergency department as these can be signs of low blood sugar.    It was very nice to see you, thank you for allowing [place] to be involved in your care. We look forward to seeing you next time. Please call our clinic at [phone] if you have any questions or concerns. The best time to call is Monday-Friday from 9am-4pm, but there is someone available 24/7. If after hours or the weekend, call the main hospital number at [phone] and ask for the Internal Medicine Resident On-Call. If you need medication refills, please notify your pharmacy one week in advance and they will send [place] a request.   Please make sure to arrive 15 minutes prior to your next appointment. If you arrive late, you may be asked to reschedule.

## 2022-09-22 NOTE — Progress Notes (Signed)
Called and updated patient on results. AG has normalized. Possible lab error on most recent BMP.

## 2022-09-22 NOTE — Assessment & Plan Note (Signed)
Patient was last seen for this condition on 5/13. Patient reported episodes of hypoglycemia. Marcelline Deist was discontinued. SGLT2i contraindicated given history of DKA. Was previously taking metformin but could not tolerate GI side effects.Current medications include lantus 100 units BID and Semaglutide 1 mg weekly. Patient states that they are compliant with these medications. Patient does check their blood sugar at home regularly and notes values typically in the 100s in the morning. Denies having lows at home. Patient denies polyuria, polydipsia, fatigue. Patient states that they have not yet been able to visit the ophthalmologist for yearly eye exams. A1c was 8.3% 1 month ago. Patient had BMP drawn at last visit demonstrating elevated AG of 21. Patient was told to come back for repeat labs.   Plan: - Continue lantus 100 units BID and Semaglutide 1 mg weekly - F/u ophthalmology  - Repeat A1c in 2 months - Repeat BMP today

## 2022-09-22 NOTE — Progress Notes (Signed)
CC: T2DM f/u  HPI:  [name] is a 47 y.o. female with past medical history of HLD, T2DM, DKA, bilateral BKA, anemia, obesity, venous insufficiency that presents for f/u T2DM.    Allergies as of 09/22/2022       Reactions   Lisinopril Cough   Sulfa Antibiotics Other (See Comments)   Makes sick        Medication List        Accurate as of September 22, 2022  4:03 PM. If you have any questions, ask your nurse or doctor.          Accu-Chek Guide test strip Generic drug: glucose blood Check blood sugar up to 3 times daily as needed   Accu-Chek Guide w/Device Kit Use to check blood sugar up to 3 times daily as needed   Accu-Chek Softclix Lancets lancets Use to check blood sugar up to 3 times daily as needed   acetaminophen 325 MG tablet Commonly known as: TYLENOL Take 1-2 tablets (325-650 mg total) by mouth every 6 (six) hours as needed for mild pain (pain score 1-3 or temp > 100.5).   amoxicillin-clavulanate 875-125 MG tablet Commonly known as: AUGMENTIN Take 1 tablet by mouth 2 (two) times daily. What changed: when to take this   ciprofloxacin 500 MG tablet Commonly known as: CIPRO Take 1 tablet (500 mg total) by mouth 2 (two) times daily.   citalopram 40 MG tablet Commonly known as: CELEXA TAKE 1 TABLET BY MOUTH EVERYDAY AT BEDTIME   Dexcom G7 Sensor Misc As directed   ferrous sulfate 325 (65 FE) MG tablet Take 1 tablet (325 mg total) by mouth every other day.   insulin glargine 100 UNIT/ML Solostar Pen Commonly known as: LANTUS Inject 100 Units into the skin 2 (two) times daily.   Insulin Pen Needle 32G X 4 MM Misc Use twice daily to inject insulin   pravastatin 40 MG tablet Commonly known as: PRAVACHOL Take 1 tablet (40 mg total) by mouth daily.   pregabalin 100 MG capsule Commonly known as: Lyrica Take 1 capsule (100 mg total) by mouth 3 (three) times daily.   Semaglutide (1 MG/DOSE) 4 MG/3ML Sopn Inject 1 mg into the skin once  a week.         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   Physical Exam: Vitals:   09/22/22 1317  BP: 123/73  Pulse: (!) 111  Temp: 98.3 F (36.8 C)  TempSrc: Oral  SpO2: 97%  Weight: 295 lb 12.8 oz (134.2 kg)  Height: 5\' 7"  (1.702 m)   Constitutional: Well-developed, well-nourished, appears comfortable  HENT: Normocephalic and atraumatic.  Eyes: EOMI.  Neck: Normal range of motion.  Cardiovascular: Regular rate, regular rhythm. No murmurs, rubs, or gallops. Normal radial and PT pulses bilaterally. No LE edema.  Pulmonary: Normal respiratory effort. No wheezes, rales, rhonchi, or crackles.   Abdominal: Soft. Non-distended. No tenderness. Normal bowel sounds.  Musculoskeletal: Bilateral BKA.  Neurological: Alert and oriented to person, place, and time. Non-focal. Skin: warm and dry.    Assessment & Plan:   See Encounters Tab for problem based charting.  Diabetes mellitus (HCC) Patient was last seen for this condition on 5/13. Patient reported episodes of hypoglycemia. Marcelline Deist was discontinued.  SGLT2i contraindicated given history of DKA. Was previously taking metformin but could not tolerate GI side effects.Current medications include lantus 100 units BID and Semaglutide 1 mg weekly. Patient states that they are compliant with these medications. Patient does check their blood sugar at home regularly and notes values typically in the 100s in the morning. Denies having lows at home. Patient denies polyuria, polydipsia, fatigue. Patient states that they have not yet been able to visit the ophthalmologist for yearly eye exams. A1c was 8.3% 1 month ago. Patient had BMP drawn at last visit demonstrating elevated AG of 21. Patient was told to come back for repeat labs.   Plan: -  Continue lantus 100 units BID and Semaglutide 1 mg weekly - F/u ophthalmology  - Repeat A1c in 2 months - Repeat BMP today   Belching Patient reports increased burping and flatulence over the last few weeks. Denies recent changes to her diet. Denies fever, chills, nausea, vomiting, heartburn, abdominal pain, diarrhea. Denies changes to the color of her stools. She thinks this may be related to her carbonated drinks. She states that this is not bothering her too much but wanted to bring it up. Symptoms could be secondary to ozempic.   Plan: - Reassess symptoms at next visit    Patient seen with Dr. Oswaldo Done

## 2022-09-22 NOTE — Patient Instructions (Addendum)
Thank you for your visit today!  Your blood sugars are mostly in target.   You sound like you are choosing healthy foods.   We talked about including high fiber foods and nutrient dense foods like fruits, vegetables, whole grains, nuts, seeds and beans as much as possible.    We  also talked about using words like blood sugars are "in target" or "above or below target" instead of good or bad.   You are a person with diabetes, not a diabetic.   I am so happy for you to have a plan for yourself and be carrying it out.   I can help you with weight loss when and if you need my help.   Lupita Leash [phone]

## 2022-09-22 NOTE — Assessment & Plan Note (Signed)
Patient reports increased burping and flatulence over the last few weeks. Denies recent changes to her diet. Denies fever, chills, nausea, vomiting, heartburn, abdominal pain, diarrhea. Denies changes to the color of her stools. She thinks this may be related to her carbonated drinks. She states that this is not bothering her too much but wanted to bring it up. Symptoms could be secondary to ozempic.   Plan: - Reassess symptoms at next visit

## 2022-09-22 NOTE — Progress Notes (Signed)
Lab Results  Component Value Date   HGBA1C 8.3 (H) 08/22/2022   HGBA1C 9.5 (A) 05/19/2022   HGBA1C 12.2 (A) 02/25/2022   HGBA1C 10.1 (H) 10/18/2021    BP Readings from Last 3 Encounters:  09/22/22 123/73  08/22/22 126/69  05/19/22 121/81    Wt Readings from Last 10 Encounters:  09/22/22 295 lb 12.8 oz (134.2 kg)  08/22/22 293 lb (132.9 kg)  05/19/22 258 lb 4.8 oz (117.2 kg)  05/13/22 251 lb 5.2 oz (114 kg)  04/21/22 254 lb 6.4 oz (115.4 kg)  04/15/22 250 lb (113.4 kg)  03/22/22 260 lb (117.9 kg)  02/25/22 272 lb 11.2 oz (123.7 kg)  12/01/21 270 lb (122.5 kg)  11/17/21 273 lb 11.2 oz (124.1 kg)   Diabetes Self-Management Education  Visit Type:    Appt. Start Time: 1415 Appt. End Time: 1445  09/22/2022  [name], identified by name and date of birth, is a 47 y.o. female with a diagnosis of Diabetes: type 2 .   ASSESSMENT  She feels her diet is healthy. She eats mostly at home and meals are balanced. She processed her emotions about her finances, pending disability, changes she will have when she gets her second prosthesis tomorrow.    Diabetes Self-Management Education - 09/22/22 1400       Health Coping   How would you rate your overall health? Good   she'll rate herself higher once she has her second prosthesis and is walking     Psychosocial Assessment   Patient Belief/Attitude about Diabetes Motivated to manage diabetes      Dietary Intake   Breakfast often skips    Dinner grilled chicken, mashed potatoes and salad    Beverage(s) water, diet soda      Activity / Exercise   Activity / Exercise Type ADL's      Patient Education   Previous Diabetes Education Yes (please comment)   here   Diabetes Stress and Support Identified and addressed patients feelings and concerns about diabetes   discussed person first language.   Lifestyle and Health Coping Helped patient develop diabetes management plan for (enter comment)   when she begins walking to adjust  her insulin to assist with weight loss     Individualized Goals (developed by patient)   Nutrition General guidelines for healthy choices and portions discussed      Patient Self-Evaluation of Goals - Patient rates self as meeting previously set goals (% of time)   Nutrition >75% (most of the time)      Post-Education Assessment   Patient understands the diabetes disease and treatment process. Comprehends key points    Patient understands incorporating nutritional management into lifestyle. Comprehends key points    Patient understands using medications safely. Comphrehends key points    Patient understands monitoring blood glucose, interpreting and using results Comprehends key points    Patient understands prevention, detection, and treatment of acute complications. Comprehends key points    Patient understands how to develop strategies to promote health/change behavior. Comprehends key points      Outcomes   Expected Outcomes Demonstrated interest in learning but significant barriers to change;Demonstrated interest in learning. Expect positive outcomes    Future DMSE 2 months    Program Status Completed      Subsequent Visit   Since your last visit have you continued or begun to take your medications as prescribed? Yes    Since your last visit have you had your blood pressure  checked? Yes    Is your most recent blood pressure lower, unchanged, or higher since your last visit? Unchanged    Since your last visit have you experienced any weight changes? Gain    Weight Gain (lbs) 2    Since your last visit, are you checking your blood glucose at least once a day? Yes             Individualized Plan for Diabetes Self-Management Training:   Learning Objective:  Patient will have a greater understanding of diabetes self-management. Patient education plan is to attend individual and/or group sessions per assessed needs and concerns.   Plan:   Patient Instructions  Thank you for  your visit today!  Your blood sugars are mostly in target.   You sound like you are choosing healthy foods.   We talked about including high fiber foods and nutrient dense foods like fruits, vegetables, whole grains, nuts, seeds and beans as much as possible.   We  also talked about using words like blood sugars are "in target" or "above or below target" instead of good or bad.   You are a person with diabetes, not a diabetic.   I am so happy for you to have a plan for yourself and be carrying it out.   I can help you with weight loss when and if you need my help.   Lupita Leash [phone]   Expected Outcomes:  Demonstrated interest in learning but significant barriers to change, Demonstrated interest in learning. Expect positive outcomes  Education material provided: Diabetes Resources  If problems or questions, patient to contact team via:  Phone  Future DSME appointment: 2 months Norm Parcel, RD 09/22/2022 3:15 PM.

## 2022-09-23 ENCOUNTER — Telehealth: Payer: Self-pay | Admitting: Family

## 2022-09-23 ENCOUNTER — Other Ambulatory Visit: Payer: Self-pay

## 2022-09-23 DIAGNOSIS — Z89511 Acquired absence of right leg below knee: Secondary | ICD-10-CM

## 2022-09-23 NOTE — Progress Notes (Signed)
Internal Medicine Clinic Attending  Case discussed with Dr. Mapp  At the time of the visit.  We reviewed the resident's history and exam and pertinent patient test results.  I agree with the assessment, diagnosis, and plan of care documented in the resident's note.  

## 2022-09-23 NOTE — Telephone Encounter (Signed)
Order in chart

## 2022-09-23 NOTE — Telephone Encounter (Signed)
Pt called stating she need a referral sent to [address]. Chris from [facility] informed pt to have PA Erin send one asap. I think it is [facility] physical therapy. Pt phone number is [phone] . Pt states Denny Peon should know

## 2022-09-29 ENCOUNTER — Ambulatory Visit: Payer: Medicaid Other | Attending: Orthopedic Surgery | Admitting: Physical Therapy

## 2022-09-29 DIAGNOSIS — R2681 Unsteadiness on feet: Secondary | ICD-10-CM | POA: Diagnosis present

## 2022-09-29 DIAGNOSIS — M6281 Muscle weakness (generalized): Secondary | ICD-10-CM | POA: Insufficient documentation

## 2022-09-29 DIAGNOSIS — Z89511 Acquired absence of right leg below knee: Secondary | ICD-10-CM | POA: Insufficient documentation

## 2022-09-29 DIAGNOSIS — R2689 Other abnormalities of gait and mobility: Secondary | ICD-10-CM | POA: Diagnosis present

## 2022-09-29 NOTE — Therapy (Signed)
OUTPATIENT PHYSICAL THERAPY PROSTHETICS EVALUATION   Patient Name: Lori Schmidt MRN: [id number] DOB:1975/08/23, 47 y.o., female Today's Date: 09/29/2022  PCP: Rana Snare, DO REFERRING PROVIDER: Nadara Mustard, MD  END OF SESSION:  PT End of Session - 09/29/22 1023     Visit Number 1    Number of Visits 9   Plus eval   Date for PT Re-Evaluation 11/24/22    Authorization Type [place] [employer]    Authorization - Visit Number 3    PT Start Time 1020    PT Stop Time 1102    PT Time Calculation (min) 42 min    Equipment Utilized During Treatment Other (comment);Gait belt   Bilateral BKA prosthetics   Activity Tolerance Patient tolerated treatment well    Behavior During Therapy [facility] for tasks assessed/performed             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   Type 2 diabetes mellitus (HCC)    Past Surgical History:  Procedure Laterality Date   AMPUTATION Left 02/19/2021   Procedure: AMPUTATION BELOW KNEE;  Surgeon: Nadara Mustard, MD;  Location: [facility] OR;  Service: Orthopedics;  Laterality: Left;   AMPUTATION Right 03/23/2022   Procedure: RIGHT BELOW KNEE AMPUTATION;  Surgeon: Nadara Mustard, MD;  Location: [facility] OR;  Service: Orthopedics;  Laterality: Right;   APPLICATION OF WOUND VAC Right 05/13/2022   Procedure: APPLICATION OF WOUND VAC;  Surgeon: Nadara Mustard, MD;  Location: [facility] OR;  Service: Orthopedics;  Laterality: Right;   BREAST CYST EXCISION Right 05/2018   four cysts removed on right breast/axilla area   STUMP REVISION Right 04/15/2022   Procedure: REVISION RIGHT BELOW KNEE AMPUTATION;  Surgeon: Nadara Mustard, MD;  Location[facility] OR;  Service: Orthopedics;  Laterality: Right;   STUMP REVISION Right 05/13/2022   Procedure: RIGHT BELOW THE KNEE AMPUTATION REVISION;  Surgeon: Nadara Mustard, MD;  Location: [facility] OR;   Service: Orthopedics;  Laterality: Right;   TEE WITHOUT CARDIOVERSION N/A 02/23/2021   Procedure: TRANSESOPHAGEAL ECHOCARDIOGRAM (TEE);  Surgeon: Chrystie Nose, MD;  Location: [facility] ENDOSCOPY;  Service: Cardiovascular;  Laterality: N/A;   Patient Active Problem List   Diagnosis Date Noted   Belching 09/22/2022   Anemia 08/22/2022   S/P BKA (below knee amputation) unilateral, right (HCC) 03/24/2022   DKA, type 2 (HCC) 03/23/2022   Health care maintenance 02/25/2022   Venous insufficiency of right leg 12/01/2021   Encounter for birth control 11/10/2021   Anxiety 10/26/2021   Abscess of skin and subcutaneous tissue 10/16/2021   Dyslipidemia    Diabetic peripheral neuropathy (HCC)    S/P BKA (below knee amputation) unilateral, left (HCC) 03/05/2021   Adjustment disorder with mixed anxiety and depressed mood    Diabetes mellitus (HCC)    Morbid obesity with BMI of 40.0-44.9, adult (HCC) 01/05/2012    ONSET DATE: 09/23/2022 (referral)  REFERRING DIAG: Z89.511 (ICD-10-CM) - S/P BKA (below knee amputation) unilateral, right (HCC)  THERAPY DIAG:  Other abnormalities of gait and mobility  Unsteadiness on feet  Muscle weakness (generalized)  Rationale for Evaluation and Treatment: Rehabilitation  SUBJECTIVE:   SUBJECTIVE STATEMENT: Pt ambulated into clinic w/RW. States she received her prostheses last week. Has been walking short distances   Pt accompanied by:  Daughter, Abby  PERTINENT HISTORY: [name]  on 02/19/2021, R BKA on 03/23/22  PAIN:  Are you having pain? No  PRECAUTIONS: Fall  WEIGHT BEARING RESTRICTIONS: No  FALLS: Has patient fallen in last 6 months? Yes. Number of falls 2-3  LIVING ENVIRONMENT: Lives with: lives with their family Lives in: House/apartment Home Access: Ramped entrance and Level entry Home layout: One level Stairs: Yes: External: 1 steps; none Has following equipment at home: Single point cane, Walker - 2 wheeled, Wheelchair (manual), shower  chair, Grab bars, and Ramped entry  PLOF: Independent  PATIENT GOALS: "to get back to normal"  OBJECTIVE:    COGNITION: Overall cognitive status: Within functional limits for tasks assessed   SENSATION: Pt reports full sensation in bilateral residual limbs   POSTURE: rounded shoulders and forward head  LOWER EXTREMITY ROM: WFL   Active ROM Right eval Left eval  Hip flexion    Hip extension    Hip abduction    Hip adduction    Hip internal rotation    Hip external rotation    Knee flexion    Knee extension    Ankle dorsiflexion    Ankle plantarflexion    Ankle inversion    Ankle eversion     (Blank rows = not tested)  LOWER EXTREMITY MMT: Tested in seated position   MMT Right eval Left eval  Hip flexion 5 5  Hip extension    Hip abduction 5 5  Hip adduction 5 5  Hip internal rotation    Hip external rotation    Knee flexion 5 5  Knee extension 5 5  Ankle dorsiflexion    Ankle plantarflexion    Ankle inversion    Ankle eversion    (Blank rows = not tested)  BED MOBILITY:  Independent per pt   TRANSFERS: Sit to stand: SBA Stand to sit: SBA   GAIT: Gait pattern:  IR of R foot, step through pattern, decreased step length- Left, decreased stance time- Right, decreased stride length, decreased hip/knee flexion- Right, lateral hip instability, trunk flexed, and narrow BOS Distance walked: Various clinic distances plus  Assistive device utilized: Walker - 2 wheeled Level of assistance: CGA Comments: Noted IR of R foot due to needing extra socks. Pt ambulates w/narrow BOS, narrowly missing hitting her feet during each swing phase.   FUNCTIONAL TESTS:  2 minute walk test: 115' loop completed 1x + 74" = 189' w/RW and CGA  CARDIOVASCULAR RESPONSE: Functional activity: Pre-activity vitals: BP: 121/82 mmHg HR: 101 SpO2: 98 Post-activity vitals: BP: 172/102 mmHg HR: 117 SpO2: 98 Modified Borg scale for dyspnea: 2: mild shortness of  breath  After two minutes: BP 129/93 mmHg   CURRENT PROSTHETIC WEAR ASSESSMENT: Patient is independent with: skin check, residual limb care, care of non-amputated limb, prosthetic cleaning, ply sock cleaning, correct ply sock adjustment, proper wear schedule/adjustment, and proper weight-bearing schedule/adjustment Patient is dependent with:  N/A Donning prosthesis: Min A Doffing prosthesis: Min A pt has difficulty managing new pin lock as it does not stick out as much as previous pin lock Prosthetic wear tolerance: 8 hours/day, 7 days/week Prosthetic weight bearing tolerance: 5 minutes Edema: Increased edema in RLE in AM, wearing 15 ply socks by PM  Residual limb condition: Not assessed  Prosthetic description: PTB sockets w/pin lock  K code/activity level with prosthetic use: Level 2   TODAY'S TREATMENT:              Next Session  PATIENT EDUCATION: Education details: POC, eval findings, importance of taking rest breaks when needed and monitoring cardiovascular response Person educated: Patient and Child(ren) Education method: Medical illustrator Education comprehension: verbalized understanding  HOME EXERCISE PROGRAM: To be established  ASSESSMENT:  CLINICAL IMPRESSION: Patient is a 47 year old female referred to Neuro OPPT for bilateral BKA.  Pt's PMH is significant for: HLD, T2DM, DKA, bilateral BKA, anemia, obesity, venous insufficiency. The following deficits were present during the exam: decreased activity tolerance, impaired functional strength, impaired endurance and difficulty w/ambualtion. Based on falls history and bilateral BKA, pt is an incr risk for falls. Pt would benefit from skilled PT to address these impairments and functional limitations to maximize functional mobility independence.    OBJECTIVE IMPAIRMENTS: Abnormal gait,  decreased activity tolerance, decreased coordination, decreased endurance, decreased mobility, difficulty walking, decreased strength, increased edema, prosthetic dependency , and pain  ACTIVITY LIMITATIONS: carrying, lifting, standing, squatting, stairs, transfers, bathing, toileting, hygiene/grooming, and locomotion level  PARTICIPATION LIMITATIONS: meal prep, interpersonal relationship, driving, shopping, community activity, occupation, and yard work  PERSONAL FACTORS: Fitness, Past/current experiences, Transportation, and 1 comorbidity: Bilateral BKA  are also affecting patient's functional outcome.   REHAB POTENTIAL: Good  CLINICAL DECISION MAKING: Evolving/moderate complexity  EVALUATION COMPLEXITY: Moderate   GOALS: Goals reviewed with patient? Yes  SHORT TERM GOALS: Target date: 10/27/2022   Pt will be independent with initial HEP for improved strength, balance, transfers and gait.  Baseline: not established on eval  Goal status: INITIAL  2.  Gait speed to be assessed and STG/LTG updated  Baseline:  Goal status: INITIAL   LONG TERM GOALS: Target date: 11/24/2022    Pt will be independent with final HEP for improved strength, balance, transfers and gait.  Baseline:  Goal status: INITIAL  2.  Pt will ambulate greater than or equal to 250 feet on with LRAD mod I for improved cardiovascular endurance and BLE strength.   Baseline: 91' w/RW and CGA Goal status: INITIAL  3.  Gait speed goal Baseline:  Goal status: INITIAL  4.  Pt will ascend/descend curb and ramp w/LRAD and SBA for improved community ambulation  Baseline:  Goal status: INITIAL    PLAN:  PT FREQUENCY: 1x/week  PT DURATION: 8 weeks  PLANNED INTERVENTIONS: Therapeutic exercises, Therapeutic activity, Neuromuscular re-education, Balance training, Gait training, Patient/Family education, Self Care, Joint mobilization, Stair training, Prosthetic training, DME instructions, Manual therapy,  and Re-evaluation  PLAN FOR NEXT SESSION: Assess gait speed and update goal. Establish HEP for standing BLE strength, endurance and balance. Work on walking in // bars and endurance. Monitor vitals   Check all possible CPT codes: 40981 - PT Re-evaluation, 97110- Therapeutic Exercise, [phone]- Neuro Re-education, [phone] - Gait Training, [phone] - Manual Therapy, [phone] - Therapeutic Activities, [phone] - Self Care, and [phone] - Prosthetic training    Check all conditions that are expected to impact treatment: Diabetes mellitus and Structural or anatomic abnormalities   If treatment provided at initial evaluation, no treatment charged due to lack of authorization.     Jill Alexanders Marlon Vonruden, PT, DPT 09/29/2022, 12:12 PM

## 2022-10-11 ENCOUNTER — Encounter: Payer: Self-pay | Admitting: Physical Therapy

## 2022-10-11 ENCOUNTER — Ambulatory Visit: Payer: Medicaid Other | Attending: Orthopedic Surgery | Admitting: Physical Therapy

## 2022-10-11 VITALS — BP 128/84

## 2022-10-11 DIAGNOSIS — R2689 Other abnormalities of gait and mobility: Secondary | ICD-10-CM | POA: Insufficient documentation

## 2022-10-11 DIAGNOSIS — M6281 Muscle weakness (generalized): Secondary | ICD-10-CM | POA: Diagnosis present

## 2022-10-11 DIAGNOSIS — R2681 Unsteadiness on feet: Secondary | ICD-10-CM | POA: Diagnosis present

## 2022-10-11 NOTE — Therapy (Signed)
OUTPATIENT PHYSICAL THERAPY PROSTHETICS TREATMENT   Patient Name: Lori Schmidt MRN: [id number] DOB:February 03, 1976, 47 y.o., female Today's Date: 10/11/2022  PCP: Rana Snare, DO REFERRING PROVIDER: Nadara Mustard, MD  END OF SESSION:  PT End of Session - 10/11/22 1320     Visit Number 2    Number of Visits 9    Date for PT Re-Evaluation 11/24/22    Authorization Type [place] Medicaid [employer]    PT Start Time 1318    PT Stop Time 1400    PT Time Calculation (min) 42 min    Equipment Utilized During Treatment Other (comment);Gait belt   bilateral BKA prosthetics   Activity Tolerance Patient tolerated treatment well    Behavior During Therapy [facility] for tasks assessed/performed             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   Type 2 diabetes mellitus (HCC)    Past Surgical History:  Procedure Laterality Date   AMPUTATION Left 02/19/2021   Procedure: AMPUTATION BELOW KNEE;  Surgeon: Nadara Mustard, MD;  Location: [facility] OR;  Service: Orthopedics;  Laterality: Left;   AMPUTATION Right 03/23/2022   Procedure: RIGHT BELOW KNEE AMPUTATION;  Surgeon: Nadara Mustard, MD;  Location: [facility] OR;  Service: Orthopedics;  Laterality: Right;   APPLICATION OF WOUND VAC Right 05/13/2022   Procedure: APPLICATION OF WOUND VAC;  Surgeon: Nadara Mustard, MD;  Location: [facility] OR;  Service: Orthopedics;  Laterality: Right;   BREAST CYST EXCISION Right 05/2018   four cysts removed on right breast/axilla area   STUMP REVISION Right 04/15/2022   Procedure: REVISION RIGHT BELOW KNEE AMPUTATION;  Surgeon: Nadara Mustard, MD;  Location[facility] OR;  Service: Orthopedics;  Laterality: Right;   STUMP REVISION Right 05/13/2022   Procedure: RIGHT BELOW THE KNEE AMPUTATION REVISION;  Surgeon: Nadara Mustard, MD;  Location: [facility] OR;  Service: Orthopedics;  Laterality: Right;   TEE  WITHOUT CARDIOVERSION N/A 02/23/2021   Procedure: TRANSESOPHAGEAL ECHOCARDIOGRAM (TEE);  Surgeon: Chrystie Nose, MD;  Location: [facility] ENDOSCOPY;  Service: Cardiovascular;  Laterality: N/A;   Patient Active Problem List   Diagnosis Date Noted   Belching 09/22/2022   Anemia 08/22/2022   S/P BKA (below knee amputation) unilateral, right (HCC) 03/24/2022   DKA, type 2 (HCC) 03/23/2022   Health care maintenance 02/25/2022   Venous insufficiency of right leg 12/01/2021   Encounter for birth control 11/10/2021   Anxiety 10/26/2021   Abscess of skin and subcutaneous tissue 10/16/2021   Dyslipidemia    Diabetic peripheral neuropathy (HCC)    S/P BKA (below knee amputation) unilateral, left (HCC) 03/05/2021   Adjustment disorder with mixed anxiety and depressed mood    Diabetes mellitus (HCC)    Morbid obesity with BMI of 40.0-44.9, adult (HCC) 01/05/2012    ONSET DATE: 09/23/2022 (referral)  REFERRING DIAG: Z89.511 (ICD-10-CM) - S/P BKA (below knee amputation) unilateral, right (HCC)  THERAPY DIAG:  Other abnormalities of gait and mobility  Unsteadiness on feet  Muscle weakness (generalized)  Rationale for Evaluation and Treatment: Rehabilitation  SUBJECTIVE:   SUBJECTIVE STATEMENT: Pt ambulated into clinic w/RW. States she received her prostheses last week. She is doing regular skin checks at home. Ambulates in with 2WW. Denies falls/near falls.   Pt accompanied by:  Daughter, Abby  PERTINENT HISTORY: [name] on  02/19/2021, R BKA on 03/23/22  PAIN:  Are you having pain? No  PRECAUTIONS: Fall  WEIGHT BEARING RESTRICTIONS: No  FALLS: Has patient fallen in last 6 months? Yes. Number of falls 2-3  LIVING ENVIRONMENT: Lives with: lives with their family Lives in: House/apartment Home Access: Ramped entrance and Level entry Home layout: One level Stairs: Yes: External: 1 steps; none Has following equipment at home: Single point cane, Walker - 2 wheeled, Wheelchair (manual),  shower chair, Grab bars, and Ramped entry  PLOF: Independent  PATIENT GOALS: "to get back to normal"  OBJECTIVE:    COGNITION: Overall cognitive status: Within functional limits for tasks assessed   SENSATION: Pt reports full sensation in bilateral residual limbs   CURRENT PROSTHETIC WEAR ASSESSMENT: Patient is independent with: skin check, residual limb care, care of non-amputated limb, prosthetic cleaning, ply sock cleaning, correct ply sock adjustment, proper wear schedule/adjustment, and proper weight-bearing schedule/adjustment Patient is dependent with:  N/A Donning prosthesis: Min A Doffing prosthesis: Min A pt has difficulty managing new pin lock as it does not stick out as much as previous pin lock Prosthetic wear tolerance: 8 hours/day, 7 days/week Prosthetic weight bearing tolerance: 5 minutes Edema: Increased edema in RLE in AM, wearing 15 ply socks by PM  Residual limb condition: Not assessed  Prosthetic description: PTB sockets w/pin lock  K code/activity level with prosthetic use: Level 2   TODAY'S TREATMENT:           Vitals:   10/11/22 1332  BP: 128/84    TherAct:   Brief RLE skin check - one minor scab along lateral aspect of scare that is closed and healing well, minor redness on medial thigh from friction that patient is managing with lotion at end of evening. Reinforced importance of regular skin checks and making sure there are no open wounds/spots. Patient has 5 ply on the L and 5 ply on the R.    [facility] PT Assessment - 10/11/22 0001       Standardized Balance Assessment   Standardized Balance Assessment 10 meter walk test    10 Meter Walk 0.65   m/s with 2WW (SBA)           TherEX HEP Exercises (SBA): - Sit to Stand with Armchair to stand and eccentric lower without UE support 2 x 10 - Semi-Tandem Balance at [employer] Eyes Open - 2 sets - 30 seconds hold - Side Stepping with Counter Support  - 3 x 10 feet - Star Taps standing on firm  with UE support  on counter - 1 set - 10 reps                                                                                                                            PATIENT EDUCATION: Education details: Initial HEP + Skin check reinforcement Person educated: Patient and Child(ren) Education method: Explanation, Demonstration, and Handouts Education comprehension: verbalized understanding  HOME EXERCISE PROGRAM: Access  Code: [email or web address] URL: [email or web address] Date: 10/11/2022 Prepared by: Maryruth Eve  Exercises - Sit to Stand with Armchair  - 1 x daily - 7 x weekly - 3 sets - 10 reps - Semi-Tandem Balance at [employer] Eyes Open  - 1 x daily - 7 x weekly - 3 sets - 30 seconds hold - Side Stepping with Counter Support  - 1 x daily - 7 x weekly - 3 sets - Star Taps  - 1 x daily - 7 x weekly - 3 sets - 10 reps  ASSESSMENT:  CLINICAL IMPRESSION: Session emphasized skin check, establishment of initial HEP, and assessment of gait speed. Patient gait speed indicates increased risk for falls and level of limited community ambulator. Patient demonstrates increased compensation to L side without equal weightbearing coming to stand. Worked on eccentric lower to improve strength and control. Patient does present with one small 0.5 cm scab with slight yellow coloring along scar. Patient reports awareness; therapist reinforced importance of regular checks. Will follow up as indicated. Continue POC.    OBJECTIVE IMPAIRMENTS: Abnormal gait, decreased activity tolerance, decreased coordination, decreased endurance, decreased mobility, difficulty walking, decreased strength, increased edema, prosthetic dependency , and pain  ACTIVITY LIMITATIONS: carrying, lifting, standing, squatting, stairs, transfers, bathing, toileting, hygiene/grooming, and locomotion level  PARTICIPATION LIMITATIONS: meal prep, interpersonal relationship, driving, shopping, community activity, occupation, and  yard work  PERSONAL FACTORS: Fitness, Past/current experiences, Transportation, and 1 comorbidity: Bilateral BKA  are also affecting patient's functional outcome.   REHAB POTENTIAL: Good  CLINICAL DECISION MAKING: Evolving/moderate complexity  EVALUATION COMPLEXITY: Moderate   GOALS: Goals reviewed with patient? Yes  SHORT TERM GOALS: Target date: 10/27/2022   Pt will be independent with initial HEP for improved strength, balance, transfers and gait.  Baseline: not established on eval  Goal status: INITIAL  2.  Patient will improve gait speed to 0.75 m/s with LRAD to indicate improvement towards the level of community ambulator in order to participate more easily in activities outside of the home.   Baseline: 0.65 m/s with 2WW Goal status: INITIAL   LONG TERM GOALS: Target date: 11/24/2022    Pt will be independent with final HEP for improved strength, balance, transfers and gait.  Baseline:  Goal status: INITIAL  2.  Pt will ambulate greater than or equal to 250 feet on with LRAD mod I for improved cardiovascular endurance and BLE strength.   Baseline: 42' w/RW and CGA Goal status: INITIAL  3.  Patient will improve gait speed to 0.85 m/s to indicate improvement to the level of community ambulator in order to participate more easily in activities outside of the home.   Baseline: 0.65 m/s with 2WW  Goal status: INITIAL  4.  Pt will ascend/descend curb and ramp w/LRAD and SBA for improved community ambulation  Baseline:  Goal status: INITIAL    PLAN:  PT FREQUENCY: 1x/week  PT DURATION: 8 weeks  PLANNED INTERVENTIONS: Therapeutic exercises, Therapeutic activity, Neuromuscular re-education, Balance training, Gait training, Patient/Family education, Self Care, Joint mobilization, Stair training, Prosthetic training, DME instructions, Manual therapy, and Re-evaluation  PLAN FOR NEXT SESSION: Review + update HEP as needed, Work on walking in // bars and  endurance. Monitor vitals, work on ramp/curb part practice for LTG, skin check - need to follow up with wound care?/adjust wear schedule  Check all possible CPT codes: 10272 - PT Re-evaluation, 97110- Therapeutic Exercise, [phone]- Neuro Re-education, [phone] - Gait Training, [phone] - Manual Therapy, 97530 - Therapeutic Activities, [phone] -  Self Care, and 03474 - Prosthetic training    Check all conditions that are expected to impact treatment: Diabetes mellitus and Structural or anatomic abnormalities   If treatment provided at initial evaluation, no treatment charged due to lack of authorization.    Carmelia Bake, PT, DPT 10/11/2022, 3:00 PM

## 2022-10-17 ENCOUNTER — Encounter: Payer: Self-pay | Admitting: Student

## 2022-10-17 DIAGNOSIS — Z794 Long term (current) use of insulin: Secondary | ICD-10-CM

## 2022-10-17 DIAGNOSIS — E1169 Type 2 diabetes mellitus with other specified complication: Secondary | ICD-10-CM

## 2022-10-17 MED ORDER — INSULIN PEN NEEDLE 32G X 4 MM MISC: 11 refills | Status: DC

## 2022-10-18 ENCOUNTER — Ambulatory Visit: Payer: Medicaid Other | Admitting: Physical Therapy

## 2022-10-21 ENCOUNTER — Other Ambulatory Visit: Payer: Self-pay | Admitting: Orthopedic Surgery

## 2022-10-24 ENCOUNTER — Other Ambulatory Visit: Payer: Self-pay

## 2022-10-24 DIAGNOSIS — E1142 Type 2 diabetes mellitus with diabetic polyneuropathy: Secondary | ICD-10-CM

## 2022-10-24 MED ORDER — PREGABALIN 100 MG PO CAPS: 100.0000 mg | ORAL_CAPSULE | Freq: Three times a day (TID) | ORAL | 2 refills | Status: DC

## 2022-10-27 ENCOUNTER — Ambulatory Visit: Payer: Medicaid Other | Admitting: Physical Therapy

## 2022-10-27 DIAGNOSIS — R2689 Other abnormalities of gait and mobility: Secondary | ICD-10-CM | POA: Diagnosis not present

## 2022-10-27 DIAGNOSIS — R2681 Unsteadiness on feet: Secondary | ICD-10-CM

## 2022-10-27 DIAGNOSIS — M6281 Muscle weakness (generalized): Secondary | ICD-10-CM

## 2022-10-27 NOTE — Therapy (Signed)
OUTPATIENT PHYSICAL THERAPY PROSTHETICS TREATMENT- DISCHARGE SUMMARY   Patient Name: Lori Schmidt MRN: [id number] DOB:03/10/1976, 47 y.o., female Today's Date: 10/27/2022  PCP: Rana Snare, DO REFERRING PROVIDER: Nadara Mustard, MD  PHYSICAL THERAPY DISCHARGE SUMMARY  Visits from Start of Care: 3  Current functional level related to goals / functional outcomes: Mod I w/use of RW and bilateral BKA prosthetics    Remaining deficits: Bilateral BKA, impaired balance, decreased activity tolerance, high fall risk    Education / Equipment: HEP   Patient agrees to discharge. Patient goals were partially met. Patient is being discharged due to being pleased with the current functional level.   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   Type 2 diabetes mellitus (HCC)    Past Surgical History:  Procedure Laterality Date   AMPUTATION Left 02/19/2021   Procedure: AMPUTATION BELOW KNEE;  Surgeon: Nadara Mustard, MD;  Location: [facility] OR;  Service: Orthopedics;  Laterality: Left;   AMPUTATION Right 03/23/2022   Procedure: RIGHT BELOW KNEE AMPUTATION;  Surgeon: Nadara Mustard, MD;  Location: [facility] OR;  Service: Orthopedics;  Laterality: Right;   APPLICATION OF WOUND VAC Right 05/13/2022   Procedure: APPLICATION OF WOUND VAC;  Surgeon: Nadara Mustard, MD;  Location: [facility]  OR;  Service: Orthopedics;  Laterality: Right;   BREAST CYST EXCISION Right 05/2018   four cysts removed on right breast/axilla area   STUMP REVISION Right 04/15/2022   Procedure: REVISION RIGHT BELOW KNEE AMPUTATION;  Surgeon: Nadara Mustard, MD;  Location: [facility] OR;  Service: Orthopedics;  Laterality: Right;   STUMP REVISION Right 05/13/2022   Procedure: RIGHT BELOW THE KNEE AMPUTATION REVISION;  Surgeon: Nadara Mustard, MD;  Location: [facility] OR;  Service: Orthopedics;  Laterality: Right;   TEE WITHOUT CARDIOVERSION N/A 02/23/2021   Procedure: TRANSESOPHAGEAL ECHOCARDIOGRAM (TEE);  Surgeon: Chrystie Nose, MD;  Location: [facility] ENDOSCOPY;  Service: Cardiovascular;  Laterality: N/A;   Patient Active Problem List   Diagnosis Date Noted   Belching 09/22/2022   Anemia 08/22/2022   S/P BKA (below knee amputation) unilateral, right (HCC) 03/24/2022   DKA, type 2 (HCC) 03/23/2022   Health care maintenance 02/25/2022   Venous insufficiency of right leg 12/01/2021   Encounter for birth control 11/10/2021   Anxiety 10/26/2021   Abscess of skin and subcutaneous tissue 10/16/2021   Dyslipidemia    Diabetic peripheral neuropathy (HCC)    S/P BKA (below knee amputation) unilateral, left (HCC) 03/05/2021   Adjustment disorder with mixed anxiety and depressed mood    Diabetes mellitus (HCC)    Morbid obesity with BMI of 40.0-44.9, adult (HCC) 01/05/2012    ONSET DATE: 09/23/2022 (referral)  REFERRING DIAG: W09.811 (  ICD-10-CM) - S/P BKA (below knee amputation) unilateral, right (HCC)  THERAPY DIAG:  Other abnormalities of gait and mobility  Unsteadiness on feet  Muscle weakness (generalized)  Rationale for Evaluation and Treatment: Rehabilitation  SUBJECTIVE:   SUBJECTIVE STATEMENT: Pt ambulated into clinic w/RW. States she is doing well, can manage curbs and ramps now. Thinks she is ready for DC as she is more self-motivated and wants to work on things at home.   Pt accompanied by:  Daughter,  Abby  PERTINENT HISTORY: [name] on 02/19/2021, R BKA on 03/23/22  PAIN:  Are you having pain? No  PRECAUTIONS: Fall  WEIGHT BEARING RESTRICTIONS: No  FALLS: Has patient fallen in last 6 months? Yes. Number of falls 2-3  LIVING ENVIRONMENT: Lives with: lives with their family Lives in: House/apartment Home Access: Ramped entrance and Level entry Home layout: One level Stairs: Yes: External: 1 steps; none Has following equipment at home: Single point cane, Walker - 2 wheeled, Wheelchair (manual), shower chair, Grab bars, and Ramped entry  PLOF: Independent  PATIENT GOALS: "to get back to normal"  OBJECTIVE:    COGNITION: Overall cognitive status: Within functional limits for tasks assessed   SENSATION: Pt reports full sensation in bilateral residual limbs   CURRENT PROSTHETIC WEAR ASSESSMENT: Patient is independent with: skin check, residual limb care, care of non-amputated limb, prosthetic cleaning, ply sock cleaning, correct ply sock adjustment, proper wear schedule/adjustment, and proper weight-bearing schedule/adjustment Patient is dependent with:  N/A Donning prosthesis: Min A Doffing prosthesis: Min A pt has difficulty managing new pin lock as it does not stick out as much as previous pin lock Prosthetic wear tolerance: 8 hours/day, 7 days/week Prosthetic weight bearing tolerance: 5 minutes Edema: Increased edema in RLE in AM, wearing 15 ply socks by PM  Residual limb condition: Not assessed  Prosthetic description: PTB sockets w/pin lock  K code/activity level with prosthetic use: Level 2   TODAY'S TREATMENT:           There were no vitals filed for this visit.  Ther Act  STG assessment   [facility] PT Assessment - 10/27/22 1523       Ambulation/Gait   Gait velocity 32.8' over 18.56s = 1.77 ft/s w/RW           Pt reports she was told to slow down by prosthetist, as she has no reason to rush and is more stable when slower, so gait speed reduced this date.  Lengthy discussion regarding pt's goals and current level of function, as pt reports she is most challenged by endurance and back pain after standing for prolonged periods. Pt was able to go to grocery store this week, but was in and out quickly and had to use cart to ambulate. Pt reports she would like to work on her endurance at home and is very appreciative of therapy team. Pt demonstrated ability to ascend/descend ramp and curb at mod I level w/RW today and performed side stepping without UE support x10' w/CGA. Educated pt on how to obtain new PT referral if balance/mobility needs change, pt verbalized understanding.  PATIENT EDUCATION: Education details: See above  Person educated: Patient and Child(ren) Education method: Explanation, Demonstration, and Handouts Education comprehension: verbalized understanding  HOME EXERCISE PROGRAM: Access Code: [email or web address] URL: [email or web address] Date: 10/11/2022 Prepared by: Maryruth Eve  Exercises - Sit to Stand with Armchair  - 1 x daily - 7 x weekly - 3 sets - 10 reps - Semi-Tandem Balance at [employer] Eyes Open  - 1 x daily - 7 x weekly - 3 sets - 30 seconds hold - Side Stepping with Counter Support  - 1 x daily - 7 x weekly - 3 sets - Star Taps  - 1 x daily - 7 x weekly - 3 sets - 10 reps  ASSESSMENT:  CLINICAL IMPRESSION: Emphasis of skilled PT session on STG assessment and DC from PT. Pt has met 1 of 2 STGs, performing her HEP regularly. Pt did not meet her gait speed goal as she was advised to slow down per her prosthetist to work on equal step length and weightbearing. Pt overall is doing well and no longer requires skilled PT services to perform ADLs/IADLs. Pt reports she is self-motivated and would like to work on her balance and endurance at home and save therapy visits. Pt educated on how to obtain  new PT referral if mobility needs change in future.    OBJECTIVE IMPAIRMENTS: Abnormal gait, decreased activity tolerance, decreased coordination, decreased endurance, decreased mobility, difficulty walking, decreased strength, increased edema, prosthetic dependency , and pain  ACTIVITY LIMITATIONS: carrying, lifting, standing, squatting, stairs, transfers, bathing, toileting, hygiene/grooming, and locomotion level  PARTICIPATION LIMITATIONS: meal prep, interpersonal relationship, driving, shopping, community activity, occupation, and yard work  PERSONAL FACTORS: Fitness, Past/current experiences, Transportation, and 1 comorbidity: Bilateral BKA  are also affecting patient's functional outcome.   REHAB POTENTIAL: Good  CLINICAL DECISION MAKING: Evolving/moderate complexity  EVALUATION COMPLEXITY: Moderate   GOALS: Goals reviewed with patient? Yes  SHORT TERM GOALS: Target date: 10/27/2022   Pt will be independent with initial HEP for improved strength, balance, transfers and gait.  Baseline: not established on eval  Goal status: MET  2.  Patient will improve gait speed to 0.75 m/s with LRAD to indicate improvement towards the level of community ambulator in order to participate more easily in activities outside of the home.   Baseline: 0.65 m/s with 2WW; 0.53 m/s w/RW Goal status: NOT MET   LONG TERM GOALS: Target date: 11/24/2022 - ALL DC    Pt will be independent with final HEP for improved strength, balance, transfers and gait.  Baseline:  Goal status: INITIAL  2.  Pt will ambulate greater than or equal to 250 feet on with LRAD mod I for improved cardiovascular endurance and BLE strength.   Baseline: 40' w/RW and CGA Goal status: INITIAL  3.  Patient will improve gait speed to 0.85 m/s to indicate improvement to the level of community ambulator in order to participate more easily in activities outside of the home.   Baseline: 0.65 m/s with 2WW  Goal status:  INITIAL  4.  Pt will ascend/descend curb and ramp w/LRAD and SBA for improved community ambulation  Baseline:  Goal status: INITIAL    PLAN:  PT FREQUENCY: 1x/week  PT DURATION: 8 weeks  PLANNED INTERVENTIONS: Therapeutic exercises, Therapeutic activity, Neuromuscular re-education, Balance training, Gait training, Patient/Family education, Self Care, Joint mobilization, Stair training, Prosthetic training, DME instructions, Manual therapy, and Re-evaluation   Check all possible CPT codes: 29562 - PT Re-evaluation, 97110- Therapeutic Exercise, [phone]- Neuro  Re-education, [phone] - Gait Training, 56213 - Manual Therapy, [phone] - Therapeutic Activities, [phone] - Self Care, and [phone] - Prosthetic training    Check all conditions that are expected to impact treatment: Diabetes mellitus and Structural or anatomic abnormalities   If treatment provided at initial evaluation, no treatment charged due to lack of authorization.    Jill Alexanders Janyia Guion, PT, DPT 10/27/2022, 2:00 PM

## 2022-11-01 ENCOUNTER — Encounter: Payer: Medicaid Other | Admitting: Physical Therapy

## 2022-11-08 ENCOUNTER — Encounter: Payer: Medicaid Other | Admitting: Physical Therapy

## 2022-11-15 ENCOUNTER — Encounter: Payer: Medicaid Other | Admitting: Physical Therapy

## 2022-11-17 ENCOUNTER — Encounter: Payer: Self-pay | Admitting: Student

## 2022-11-23 ENCOUNTER — Ambulatory Visit (INDEPENDENT_AMBULATORY_CARE_PROVIDER_SITE_OTHER): Payer: Medicaid Other | Admitting: Student

## 2022-11-23 ENCOUNTER — Encounter: Payer: Self-pay | Admitting: Student

## 2022-11-23 VITALS — BP 125/72 | HR 99 | Temp 98.3°F | Wt 321.0 lb

## 2022-11-23 DIAGNOSIS — Z7984 Long term (current) use of oral hypoglycemic drugs: Secondary | ICD-10-CM | POA: Diagnosis not present

## 2022-11-23 DIAGNOSIS — Z309 Encounter for contraceptive management, unspecified: Secondary | ICD-10-CM | POA: Diagnosis not present

## 2022-11-23 DIAGNOSIS — E1169 Type 2 diabetes mellitus with other specified complication: Secondary | ICD-10-CM | POA: Diagnosis not present

## 2022-11-23 DIAGNOSIS — Z3042 Encounter for surveillance of injectable contraceptive: Secondary | ICD-10-CM | POA: Diagnosis not present

## 2022-11-23 DIAGNOSIS — Z0000 Encounter for general adult medical examination without abnormal findings: Secondary | ICD-10-CM

## 2022-11-23 LAB — POCT GLYCOSYLATED HEMOGLOBIN (HGB A1C): Hemoglobin A1C: 6.3 % — AB (ref 4.0–5.6)

## 2022-11-23 LAB — GLUCOSE, CAPILLARY: Glucose-Capillary: 65 mg/dL — ABNORMAL LOW (ref 70–99)

## 2022-11-23 MED ORDER — MEDROXYPROGESTERONE ACETATE 104 MG/0.65ML ~~LOC~~ SUSY
104.0000 mg | PREFILLED_SYRINGE | Freq: Once | SUBCUTANEOUS | Status: AC
Start: 2022-11-23 — End: 2022-11-23
  Administered 2022-11-23: 104 mg via SUBCUTANEOUS

## 2022-11-23 MED ORDER — METFORMIN HCL ER 500 MG PO TB24
500.0000 mg | ORAL_TABLET | Freq: Every day | ORAL | 1 refills | Status: DC
Start: 2022-11-23 — End: 2022-12-07

## 2022-11-23 NOTE — Assessment & Plan Note (Signed)
Reviewed her glucometer readings from today. She is at goal at 12am-12pm but then will go above threshold from 3pm onwards. Her glucose was 50 today. Given orange juice today and hypoglycemic symptoms improved. Lori Schmidt has been going through a lot of stressors with her daughter being home for the summer, her oldest son not working. She sometimes does not have time to snack. Stressed the importance of having snacks at hand. Does state she has not been good with meals as she will go out to eat with her children and then will go through periods where she will not eat at all because she will have a big meal later. Stressed the importance of stretching her meals and having scheduled meal times. She likes her lantus, and has been using it as prescribed (100units BID). However, she stopped taking the semaglutide three weeks ago. Since increasing dose she had stomach upset, belching, flatulence, and diarrhea. These have subsided since stopping the semaglutide. PT cannot tolerate SGLT2i due to DKA hx. She used metformin before but not the XR form. Used to have nausea with the regular form. A1C today is improved at 6.8  -Start metformin XR 500mg  once daily, some GI upset for the first week is fine but should improve with time. Let us know how this is going.  -Can consider starting long acting glipizide if metformin does not work. She is young with normal kidney function GFR >60 Cr 0.58 on 09/2021 and also knows when she is having hypoglycemic episodes. However, her lack of snacking and scheduled meals and hypoglycemic event today may not be ideal.  -Lifestyle modification, pt say donna in the past and is aware of what foods she should be eating.  -Weight loss encouraged.  -UCR today  -Referral to ophthalmology is active but has not heard back. Will follow up.

## 2022-11-23 NOTE — Assessment & Plan Note (Signed)
Is due for her depo-provera shot today. Ordered.   Pt is also aware that she is due for a colonoscopy. Screening was denies because she is currently having a lot of financial difficulty and cannot see that many specialists.  Hep C deferred.

## 2022-11-23 NOTE — Patient Instructions (Signed)
Thank you, Ms.Breion Jeppsen for allowing [place] to provide your care today.   I have ordered the following labs for you:  Lab Orders         Glucose, capillary         POC Hbg A1C       Referrals ordered today:   Referral Orders         Ambulatory referral to Ophthalmology       I have ordered the following medication/changed the following medications:   Stop the following medications: Medications Discontinued During This Encounter  Medication Reason   amoxicillin-clavulanate (AUGMENTIN) 875-125 MG tablet    ciprofloxacin (CIPRO) 500 MG tablet      Start the following medications: Meds ordered this encounter  Medications   metFORMIN (GLUCOPHAGE-XR) 500 MG 24 hr tablet    Sig: Take 1 tablet (500 mg total) by mouth daily with breakfast.    Dispense:  30 tablet    Refill:  1     Should you have any questions or concerns please call the internal medicine clinic at [phone].     Manuela Neptune, MD  [facility]

## 2022-11-23 NOTE — Progress Notes (Signed)
Established Patient Office Visit  Subjective   Patient ID: Lori Schmidt, female    DOB: January 05, 1976  Age: 47 y.o. MRN: [id number]  Chief Complaint  Patient presents with   Diabetes    C/o hyperglycemic episodes  Stoppped taking ozempic for 2-3 wks due to nonstop belching, gas, and diarrhea    HPI  Lori Schmidt is a 47 y.o. who is following up on her diabetes. She also is requesting a jury duty letter. For more details please see the A&P portion of this note.   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  Respiratory:  Negative for cough.   Cardiovascular:  Negative for chest pain.  Gastrointestinal:  Positive for abdominal pain, diarrhea and nausea.  Genitourinary:  Positive for urgency.  Skin:  Negative for rash.  Endo/Heme/Allergies:  Positive for polydipsia.  Psychiatric/Behavioral:  Positive for depression.      Objective:     BP 125/72 (BP Location: Right Arm, Patient Position: Sitting, Cuff Size: Normal)   Pulse 99   Temp 98.3 F (36.8 C) (Oral)   Wt (!) 321 lb (145.6 kg)   SpO2 100%   BMI 50.28 kg/m  BP Readings from Last 3 Encounters:  11/23/22 125/72  10/11/22 128/84  09/22/22 123/73   Wt Readings from Last 3 Encounters:  11/23/22 (!) 321 lb (145.6 kg)  09/22/22 295 lb 12.8 oz (134.2 kg)  08/22/22 293 lb (132.9 kg)      Physical Exam Constitutional:      General: She is not in acute distress.    Appearance: She is obese. She is not ill-appearing.  HENT:     Nose: No congestion or rhinorrhea.  Cardiovascular:     Rate and Rhythm: Normal rate and regular rhythm.     Heart sounds: S1 normal and S2 normal. No murmur heard.    No friction rub. No gallop.  Pulmonary:      Effort: No respiratory distress.     Breath sounds: No decreased breath sounds, wheezing, rhonchi or rales.  Abdominal:     General: Bowel sounds are normal. There is no distension.     Tenderness: There is no abdominal tenderness. There is no guarding.     Hernia: No hernia is present.  Musculoskeletal:     Comments: Bilateral prosthesis on lower extremities present  Skin:    Coloration: Skin is not jaundiced.    Results for orders placed or performed in visit on 11/23/22  Glucose, capillary  Result Value Ref Range   Glucose-Capillary 65 (L) 70 - 99 mg/dL   Comment 1 Call MD NNP PA CNM    Comment 2 Document in Chart   POC Hbg A1C  Result Value Ref Range   Hemoglobin A1C 6.3 (A) 4.0 - 5.6 %   HbA1c POC (<> result, manual entry)     HbA1c, POC (prediabetic range)     HbA1c, POC (controlled diabetic range)       Assessment & Plan:   Problem List Items Addressed This Visit       Endocrine   Diabetes mellitus (HCC) - Primary    Reviewed her glucometer readings from today. She is at goal at 12am-12pm but  then will go above threshold from 3pm onwards. Her glucose was 50 today. Given orange juice today and hypoglycemic symptoms improved. [name] has been going through a lot of stressors with her daughter being home for the summer, her oldest son not working. She sometimes does not have time to snack. Stressed the importance of having snacks at hand. Does state she has not been good with meals as she will go out to eat with her children and then will go through periods where she will not eat at all because she will have a big meal later. Stressed the importance of stretching her meals and having scheduled meal times. She likes her lantus, and has been using it as prescribed (100units BID). However, she stopped taking the semaglutide three weeks ago. Since increasing dose she had stomach upset, belching, flatulence, and diarrhea. These have subsided since stopping the  semaglutide. PT cannot tolerate SGLT2i due to DKA hx. She used metformin before but not the XR form. Used to have nausea with the regular form. A1C today is improved at 6.8  -Start metformin XR 500mg  once daily, some GI upset for the first week is fine but should improve with time. Let us know how this is going.  -Can consider starting long acting glipizide if metformin does not work. She is young with normal kidney function GFR >60 Cr 0.58 on 09/2021 and also knows when she is having hypoglycemic episodes. However, her lack of snacking and scheduled meals and hypoglycemic event today may not be ideal.  -Lifestyle modification, pt say donna in the past and is aware of what foods she should be eating.  -Weight loss encouraged.  -UCR today  -Referral to ophthalmology is active but has not heard back. Will follow up.       Relevant Medications   metFORMIN (GLUCOPHAGE-XR) 500 MG 24 hr tablet   Other Relevant Orders   POC Hbg A1C (Completed)   Glucose, capillary (Completed)   Ambulatory referral to Ophthalmology     Other   Encounter for birth control   Health care maintenance    Is due for her depo-provera shot today. Ordered.   Pt is also aware that she is due for a colonoscopy. Screening was denies because she is currently having a lot of financial difficulty and cannot see that many specialists.  Hep C deferred.        Return in about 4 weeks (around 12/21/2022).    Manuela Neptune, MD

## 2022-11-24 ENCOUNTER — Telehealth: Payer: Self-pay

## 2022-11-24 NOTE — Progress Notes (Signed)
Internal Medicine Clinic Attending  I was physically present during the key portions of the resident provided service and participated in the medical decision making of patient's management care. I reviewed pertinent patient test results.  The assessment, diagnosis, and plan were formulated together and I agree with the documentation in the resident's note.  Williams, Julie Anne, MD  

## 2022-11-24 NOTE — Telephone Encounter (Signed)
Decision:Approved Loralie Moffat (Key: [id number]) PA Case ID #: [id number] Need Help? Call us at [phone] Outcome Approved today by [facility] PA Case: [id number], Status: Approved, Coverage Starts on: 11/24/2022 12:00:00 AM, Coverage Ends on: 11/24/2023 12:00:00 AM. Authorization Expiration Date: 11/23/2023 Drug Dexcom G7 Sensor ePA cloud logo Form [employer] [place] [employer] [place] Form [phone] NCPDP)

## 2022-11-24 NOTE — Telephone Encounter (Signed)
Prior Authorization for patient (Dexcom G7 Sensor) came through on cover my meds was submitted with last office notes awaiting approval or denial.  ION:[id number]

## 2022-11-29 ENCOUNTER — Encounter: Payer: Medicaid Other | Admitting: Physical Therapy

## 2022-12-06 ENCOUNTER — Encounter: Payer: Medicaid Other | Admitting: Physical Therapy

## 2022-12-07 ENCOUNTER — Other Ambulatory Visit: Payer: Self-pay | Admitting: Student

## 2022-12-07 DIAGNOSIS — E1169 Type 2 diabetes mellitus with other specified complication: Secondary | ICD-10-CM

## 2022-12-07 NOTE — Telephone Encounter (Signed)
Next appt scheduled 9/16.

## 2022-12-26 ENCOUNTER — Encounter: Payer: Medicaid Other | Admitting: Student

## 2023-01-14 ENCOUNTER — Other Ambulatory Visit: Payer: Self-pay | Admitting: Orthopedic Surgery

## 2023-01-14 DIAGNOSIS — E1142 Type 2 diabetes mellitus with diabetic polyneuropathy: Secondary | ICD-10-CM

## 2023-02-13 ENCOUNTER — Ambulatory Visit: Payer: Medicaid Other | Admitting: Student

## 2023-02-13 ENCOUNTER — Encounter: Payer: Self-pay | Admitting: Student

## 2023-02-13 ENCOUNTER — Other Ambulatory Visit: Payer: Self-pay | Admitting: Student

## 2023-02-13 VITALS — BP 120/85 | HR 98 | Temp 98.5°F | Ht 67.0 in | Wt 343.1 lb

## 2023-02-13 DIAGNOSIS — Z794 Long term (current) use of insulin: Secondary | ICD-10-CM | POA: Diagnosis not present

## 2023-02-13 DIAGNOSIS — Z3042 Encounter for surveillance of injectable contraceptive: Secondary | ICD-10-CM

## 2023-02-13 DIAGNOSIS — F4323 Adjustment disorder with mixed anxiety and depressed mood: Secondary | ICD-10-CM

## 2023-02-13 DIAGNOSIS — E1169 Type 2 diabetes mellitus with other specified complication: Secondary | ICD-10-CM

## 2023-02-13 DIAGNOSIS — F419 Anxiety disorder, unspecified: Secondary | ICD-10-CM

## 2023-02-13 NOTE — Patient Instructions (Addendum)
Thank you, Ms.Gracie Gupta for allowing [place] to provide your care today. Today we discussed your diabetes, mood, and depo provera injection.   I have ordered the following labs for you:  Lab Orders         Microalbumin / Creatinine Urine Ratio         Basic metabolic panel         Hemoglobin A1C      Tests ordered today:    Referrals ordered today:   Referral Orders         AMB Referral [facility]BCI Care Management       I have ordered the following medication/changed the following medications:   Stop the following medications: There are no discontinued medications.   Start the following medications: No orders of the defined types were placed in this encounter.    Follow up: 1 month    Remember:   - We got some labs today that will help [place] determine what other diabetic medication we can add to your medication regimen - I will call you with your lab results and let you know where to go from there (I can send in a prescription as needed) - I will place a referral for integrated behavioral health, someone will call you to provide resources for emotional/behavioral support - We will follow up on your recent ophthalmology referral   Should you have any questions or concerns please call the internal medicine clinic at [phone].     Wojciech Willetts Colbert Coyer, MD PGY-1 Internal Medicine Teaching Progam [facility]

## 2023-02-13 NOTE — Progress Notes (Signed)
Established Patient Office Visit  Subjective   Patient ID: Lori Schmidt, female    DOB: [birth year]  Age: 47 y.o. MRN: [id number]  Chief Complaint  Patient presents with   Follow-up   Patient is a 47 yo with a past medical history stated below who presents today for follow-up for diabetes follow up, depression/anxiety, and health maintenance. Please see problem based assessment and plan for additional details.     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  Genitourinary:  Negative for dysuria.  Psychiatric/Behavioral:  Positive for depression. The patient is nervous/anxious.      Objective:     BP 120/85 (BP Location: Left Arm, Patient Position: Sitting, Cuff Size: Large)   Pulse 98   Temp 98.5 F (36.9 C) (Oral)   Ht 5\' 7"  (1.702 m)   Wt (!) 343 lb 1.6 oz (155.6 kg)   LMP  (LMP Unknown)   SpO2 100%   BMI 53.74 kg/m  BP Readings from Last 3 Encounters:  02/13/23 120/85  11/23/22 125/72  10/11/22 128/84   Wt Readings from Last 3 Encounters:  02/13/23 (!) 343 lb 1.6 oz (155.6 kg)  11/23/22 (!) 321 lb (145.6 kg)  09/22/22 295 lb 12.8 oz (134.2 kg)    Physical Exam Constitutional:      Appearance: Normal appearance.  HENT:     Head: Normocephalic and atraumatic.     Nose: Nose normal.     Mouth/Throat:     Mouth: Mucous membranes are moist.  Eyes:     Extraocular Movements: Extraocular movements intact.     Pupils: Pupils are equal, round, and reactive to light.  Cardiovascular:     Rate and Rhythm: Normal rate and regular rhythm.     Pulses: Normal pulses.     Heart sounds: Normal heart sounds.  Pulmonary:     Effort: Pulmonary effort is normal.     Breath sounds:  Normal breath sounds.  Abdominal:     General: Bowel sounds are normal.     Palpations: Abdomen is soft.  Musculoskeletal:        General: No swelling.  Skin:    General: Skin is warm and dry.  Neurological:     General: No focal deficit present.     Mental Status: She is alert.  Psychiatric:     Comments: Patient became visibly upset discussing the recent loss of her nephew.     The ASCVD Risk score (Arnett DK, et al., 2019) failed to calculate for the following reasons:   Cannot find a previous HDL lab    Assessment & Plan:   Problem List Items Addressed This Visit       Endocrine   Diabetes mellitus (HCC)    Patient's Hgb A1c 6.3% 2 months ago, 8.3% today. Patient was told to start metformin at [facility] visit 8/14 but she did not take it as she has a history of GI upset with the medication. She reports she has loose stools, often requiring clothing changes, and did not want to make it worse by taking metformin. GLP-1 agonists would likely have similar effect. She has not tolerated SGLT2i due to history of DKA and GU infections. Continues  to take lantus 100 units BID, average glucose levels have been ~240s, no low glucose levels reported. Given A1c > 7 today, can consider adding DPP-4 like sitagliptin if patient is agreeable since it may also carry a risk of worsening her diarrhea.   She reports she has not gotten a call for the ophthalmology referral that was placed at the last visit, will check on referral. Due to recent life events she feels she has not been as active and has not been eating as well. Reports she has gained weight.   For her loose stools, can consider pancreatic insufficiency workup or bacterial overgrowth with her history of diabetes. Seems to be chronic. Can follow up and consider ordering elastase/referral to GI for further testing and management.  - Continue to take lantus 100 units BID - Discuss starting sitagliptin 25 mg daily       Relevant Orders    Microalbumin / Creatinine Urine Ratio (Completed)   Basic metabolic panel (Completed)   Hemoglobin A1C (Completed)   AMB Referral VBCI Care Management     Other   Adjustment disorder with mixed anxiety and depressed mood    Patient states her nephew was killed over the weekend. She has been having difficulties accepting the news and being there to support her sister. She is also anxious and stressed regarding her living situation following the last BKA. She has two sons at home, one teen and one adult. She states they could be more helpful around the house and she often wishes they would help her with appointments as her mobility is limited. Patient currently taking citalopram 40 mg daily, feels like it is helpful but is interested in talk therapy resources.  - Placed referral for integrated behavioral health today, Marena Chancy will reach out to patient  - Follow up on referral and mood at next visit      Encounter for birth control - Primary    Patient received was given las Depo-Provera injection 11/23/22. Nurse visit made for 02/20/23 for next injection.       Return in about 4 weeks (around 03/13/2023) for Diabetes management.  Patient seen with Dr. Criselda Peaches.   Brynnleigh Mcelwee Colbert Coyer, MD

## 2023-02-14 LAB — BASIC METABOLIC PANEL
BUN/Creatinine Ratio: 31 — ABNORMAL HIGH (ref 9–23)
BUN: 15 mg/dL (ref 6–24)
CO2: 17 mmol/L — ABNORMAL LOW (ref 20–29)
Calcium: 8.9 mg/dL (ref 8.7–10.2)
Chloride: 101 mmol/L (ref 96–106)
Creatinine, Ser: 0.48 mg/dL — ABNORMAL LOW (ref 0.57–1.00)
Glucose: 161 mg/dL — ABNORMAL HIGH (ref 70–99)
Potassium: 4.3 mmol/L (ref 3.5–5.2)
Sodium: 137 mmol/L (ref 134–144)
eGFR: 117 mL/min/{1.73_m2} (ref >59)

## 2023-02-14 LAB — HEMOGLOBIN A1C
Est. average glucose Bld gHb Est-mCnc: 192 mg/dL
Hgb A1c MFr Bld: 8.3 % — ABNORMAL HIGH (ref 4.8–5.6)

## 2023-02-14 LAB — MICROALBUMIN / CREATININE URINE RATIO
Creatinine, Urine: 64.3 mg/dL
Microalb/Creat Ratio: 64 mg/g{creat} — ABNORMAL HIGH (ref 0–29)
Microalbumin, Urine: 41.1 ug/mL

## 2023-02-15 NOTE — Assessment & Plan Note (Addendum)
Patient's Hgb A1c 6.3% 2 months ago, 8.3% today. Patient was told to start metformin at [facility] visit 8/14 but she did not take it as she has a history of GI upset with the medication. She reports she has loose stools, often requiring clothing changes, and did not want to make it worse by taking metformin. GLP-1 agonists would likely have similar effect. She has not tolerated SGLT2i due to history of DKA and GU infections. Continues to take lantus 100 units BID, average glucose levels have been ~240s, no low glucose levels reported. Given A1c > 7 today, can consider adding DPP-4 like sitagliptin if patient is agreeable since it may also carry a risk of worsening her diarrhea.   She reports she has not gotten a call for the ophthalmology referral that was placed at the last visit, will check on referral. Due to recent life events she feels she has not been as active and has not been eating as well. Reports she has gained weight.   For her loose stools, can consider pancreatic insufficiency workup or bacterial overgrowth with her history of diabetes. Seems to be chronic. Can follow up and consider ordering elastase/referral to GI for further testing and management.  - Continue to take lantus 100 units BID - Discuss starting sitagliptin 25 mg daily

## 2023-02-15 NOTE — Assessment & Plan Note (Signed)
Patient states her nephew was killed over the weekend. She has been having difficulties accepting the news and being there to support her sister. She is also anxious and stressed regarding her living situation following the last BKA. She has two sons at home, one teen and one adult. She states they could be more helpful around the house and she often wishes they would help her with appointments as her mobility is limited. Patient currently taking citalopram 40 mg daily, feels like it is helpful but is interested in talk therapy resources.  - Placed referral for integrated behavioral health today, Marena Chancy will reach out to patient  - Follow up on referral and mood at next visit

## 2023-02-15 NOTE — Assessment & Plan Note (Signed)
Patient received was given las Depo-Provera injection 11/23/22. Nurse visit made for 02/20/23 for next injection.

## 2023-02-16 NOTE — Progress Notes (Signed)
Internal Medicine Clinic Attending  I was physically present during the key portions of the resident provided service and participated in the medical decision making of patient's management care. I reviewed pertinent patient test results.  The assessment, diagnosis, and plan were formulated together and I agree with the documentation in the resident's note.  Patient with increased emotions today, feeling of being a burden, kids not helping, recent loss.  Agree with referral to talk therapy with Christen Butter for grief counseling.   Inez Catalina, MD

## 2023-02-20 ENCOUNTER — Encounter: Payer: Self-pay | Admitting: *Deleted

## 2023-02-20 ENCOUNTER — Ambulatory Visit: Payer: Medicaid Other | Admitting: *Deleted

## 2023-02-20 DIAGNOSIS — Z309 Encounter for contraceptive management, unspecified: Secondary | ICD-10-CM

## 2023-02-20 DIAGNOSIS — Z3042 Encounter for surveillance of injectable contraceptive: Secondary | ICD-10-CM

## 2023-02-20 MED ORDER — MEDROXYPROGESTERONE ACETATE 104 MG/0.65ML ~~LOC~~ SUSY
104.0000 mg | PREFILLED_SYRINGE | Freq: Once | SUBCUTANEOUS | Status: AC
Start: 2023-02-20 — End: 2023-02-20
  Administered 2023-02-20: 104 mg via SUBCUTANEOUS

## 2023-02-20 NOTE — Progress Notes (Signed)
Depo given w/o any complications. Next depo injection -Feb 3 - Feb 17; I will send pt a My Chart message also.

## 2023-03-14 ENCOUNTER — Encounter: Payer: Self-pay | Admitting: Student

## 2023-03-21 ENCOUNTER — Other Ambulatory Visit: Payer: Self-pay | Admitting: Family

## 2023-03-21 DIAGNOSIS — E1142 Type 2 diabetes mellitus with diabetic polyneuropathy: Secondary | ICD-10-CM

## 2023-03-27 ENCOUNTER — Encounter: Payer: Medicaid Other | Admitting: Student

## 2023-04-18 ENCOUNTER — Other Ambulatory Visit: Payer: Self-pay

## 2023-04-18 ENCOUNTER — Other Ambulatory Visit: Payer: Self-pay | Admitting: Orthopedic Surgery

## 2023-04-18 DIAGNOSIS — E1142 Type 2 diabetes mellitus with diabetic polyneuropathy: Secondary | ICD-10-CM

## 2023-04-20 ENCOUNTER — Ambulatory Visit: Payer: Medicaid Other | Admitting: Student

## 2023-04-20 ENCOUNTER — Encounter: Payer: Self-pay | Admitting: Student

## 2023-04-20 VITALS — BP 143/80 | HR 103 | Temp 98.5°F | Ht 67.0 in | Wt 339.6 lb

## 2023-04-20 DIAGNOSIS — Z794 Long term (current) use of insulin: Secondary | ICD-10-CM | POA: Diagnosis not present

## 2023-04-20 DIAGNOSIS — Z89512 Acquired absence of left leg below knee: Secondary | ICD-10-CM

## 2023-04-20 DIAGNOSIS — E119 Type 2 diabetes mellitus without complications: Secondary | ICD-10-CM | POA: Diagnosis present

## 2023-04-20 DIAGNOSIS — Z89511 Acquired absence of right leg below knee: Secondary | ICD-10-CM

## 2023-04-20 DIAGNOSIS — F419 Anxiety disorder, unspecified: Secondary | ICD-10-CM | POA: Diagnosis not present

## 2023-04-20 NOTE — Assessment & Plan Note (Signed)
 See above problem S/P BKA for more details.

## 2023-04-20 NOTE — Patient Instructions (Addendum)
 Thank you, Ms.Geofm Earnie Andrew for allowing us  to provide your care today. Today we discussed   -Our office is here to support you, please contact us  if you ever have any concerns. -Please follow up with Renda next week for therapy. -I will send in application for personal care services.  -At next visit, we can restart Januvia  since it has helped in the past.  -I hope you feel better, please let me know what other resources you may need.  Follow up:  1 month    Should you have any questions or concerns please call the internal medicine clinic at [phone].    Ninnie Fein, D.O. [facility]

## 2023-04-20 NOTE — Assessment & Plan Note (Signed)
 Hx of bilateral BKA. Receives supplies from [employer] for her prosthesis. Reports the liners are worn and not adhering/fitting well which impairs her ambulation. DME order sent so patient can be refitted and receive new supplies for her prosthesis for b/[name].   Plan -DME referral sent for new b/[name] supplies from [facility]

## 2023-04-20 NOTE — Assessment & Plan Note (Addendum)
 Last A1c 8.3%. On Lantus  100 units BID. Denies any hypoglycemia. Patient was tearful during encounter due to ongoing stressors and anxiety. Did not approach further discussions about diabetes given her current mood. No med changes today. She is open to restarting sitagliptin  (prescribed and tolerated before) at future visit. No to SGLT2 due to DKA and yeast infections. No to GLP-1 and metformin  due to GI side effects.   Plan -Continue Lantus  100 units BID -Continue glucose monitoring  -Add on sitagliptin  at next OV -F/u visit in 1 month

## 2023-04-20 NOTE — Progress Notes (Signed)
 CC: need new prosthetic liners b/[name], anxiety  HPI:  Lori Schmidt is a 48 y.o. female living with a history stated below and presents today for request new prosthetic liners and anxiety. Please see problem based assessment and plan for additional details.  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     Blood Glucose Monitoring Suppl (ACCU-CHEK GUIDE) w/Device KIT Use to check blood sugar up to 3 times daily as needed 1 kit 1   citalopram  (CELEXA ) 40 MG tablet TAKE 1 TABLET BY MOUTH EVERYDAY AT BEDTIME 90 tablet 4   Continuous Blood Gluc Sensor (DEXCOM G7 SENSOR) MISC As directed 3 each 11   ferrous sulfate  325 (65 FE) MG tablet Take 1 tablet (325 mg total) by mouth every other day. 15 tablet 3   glucose blood (ACCU-CHEK GUIDE) test strip Check blood sugar up to 3 times daily as needed 100 each 12   insulin  glargine (LANTUS ) 100 UNIT/ML Solostar Pen Inject 100 Units into the skin 2 (two) times daily. 60 mL 11   Insulin  Pen Needle 32G X 4 MM MISC Use twice daily to inject insulin  120 each 11   pravastatin  (PRAVACHOL ) 40 MG tablet Take 1 tablet (40 mg total) by mouth daily. 30 tablet 11   pregabalin  (LYRICA ) 100 MG capsule TAKE 1 CAPSULE BY MOUTH THREE TIMES A DAY 90 capsule 0   No current facility-administered medications on file prior to visit.    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 Mother is [place]   Social Drivers of Health   Financial Resource Strain: Low Risk  (04/20/2023)   Overall Financial Resource Strain (CARDIA)    Difficulty of Paying Living Expenses: Not very hard  Food Insecurity: No Food Insecurity (04/20/2023)   Hunger Vital Sign    Worried About Running  Out of Food in the Last Year: Never true    Ran Out of Food in the Last Year: Never true  Transportation Needs: No Transportation Needs (04/20/2023)   PRAPARE - Administrator, Civil Service (Medical): No    Lack of Transportation (Non-Medical): No  Physical Activity: Inactive (04/20/2023)   Exercise Vital Sign    Days of Exercise per Week: 0 days    Minutes of Exercise per Session: 0 min  Stress: Stress Concern Present (04/20/2023)   [employer] of Occupational Health - Occupational Stress Questionnaire    Feeling of Stress : Very much  Social Connections: Moderately Isolated (04/20/2023)   Social Connection and Isolation Panel [NHANES]    Frequency of Communication with Friends and Family: More than three times a week    Frequency of Social Gatherings with Friends and Family:  Once a week    Attends Religious Services: Never    Database Administrator or Organizations: Yes    Attends Banker Meetings: Never    Marital Status: Divorced  Catering Manager Violence: Not At Risk (04/20/2023)   Humiliation, Afraid, Rape, and Kick questionnaire    Fear of Current or Ex-Partner: No    Emotionally Abused: No    Physically Abused: No    Sexually Abused: No    Review of Systems: ROS negative except for what is noted on the assessment and plan.  Vitals:   04/20/23 1010  BP: (!) 143/80  Pulse: (!) 103  Temp: 98.5 F (36.9 C)  TempSrc: Oral  SpO2: 97%  Weight: (!) 339 lb 9.6 oz (154 kg)  Height: 5' 7 (1.702 m)   Physical Exam: Constitutional: alert, female sitting in wheelchair, in no acute distress Cardiovascular: mildly tachycardic, normal rhythm Pulmonary/Chest: normal work of breathing on room air MSK: bilateral BKA with prosthesis, noted liners appear worn  Neurological: alert & oriented x 3 Psych: tearful and anxious mood  Assessment & Plan:   S/P BKA (below knee amputation), left (HCC) Hx of bilateral BKA. Receives supplies from [employer] for her prosthesis. Reports the liners are worn and not adhering/fitting well which impairs her ambulation. DME order sent so patient can be refitted and receive new supplies for her prosthesis for b/[name].   Plan -DME referral sent for new b/[name] supplies from [facility]   S/P BKA (below knee amputation), right (HCC) See above problem S/P BKA for more details.   Anxiety Reports ongoing stressors that is multifactorial. Concerns for ongoing health conditions such as her b/[name]. Concerns for caring for her children but also herself. Has anxiety due to unable to do things like before at home such as daily chores. Tearful during most of the encounter today. Listened and provided support and empathy. Patient is aware of appt with Renda next week which she is looking forward to. On citalopram  40 mg  daily.   Plan -Appt with Renda next week -Continue citalopram  40 mg  -F/u visit in 1 month  Uncontrolled type 2 diabetes mellitus on insulin  (HCC) Last A1c 8.3%. On Lantus  100 units BID. Denies any hypoglycemia. Patient was tearful during encounter due to ongoing stressors and anxiety. Did not approach further discussions about diabetes given her current mood. No med changes today. She is open to restarting sitagliptin  (prescribed and tolerated before) at future visit. No to SGLT2 due to DKA and yeast infections. No to GLP-1 and metformin  due to GI side effects.   Plan -Continue Lantus  100  units BID -Continue glucose monitoring  -Add on sitagliptin  at next OV -F/u visit in 1 month   Patient discussed with Dr. Guilloud  Haileyann Staiger, D.O. [facility] Internal Medicine, PGY-2 Phone: [phone] Date 04/20/2023 Time 8:59 PM

## 2023-04-20 NOTE — Assessment & Plan Note (Signed)
 Reports ongoing stressors that is multifactorial. Concerns for ongoing health conditions such as her b/[name]. Concerns for caring for her children but also herself. Has anxiety due to unable to do things like before at home such as daily chores. Tearful during most of the encounter today. Listened and provided support and empathy. Patient is aware of appt with Renda next week which she is looking forward to. On citalopram  40 mg daily.   Plan -Appt with Renda next week -Continue citalopram  40 mg  -F/u visit in 1 month

## 2023-04-21 NOTE — Progress Notes (Signed)
 Internal Medicine Clinic Attending  Case discussed with the resident at the time of the visit.  We reviewed the resident's history and exam and pertinent patient test results.  I agree with the assessment, diagnosis, and plan of care documented in the resident's note.

## 2023-04-24 ENCOUNTER — Ambulatory Visit (INDEPENDENT_AMBULATORY_CARE_PROVIDER_SITE_OTHER): Payer: Medicaid Other | Admitting: Licensed Clinical Social Worker

## 2023-04-24 DIAGNOSIS — F4323 Adjustment disorder with mixed anxiety and depressed mood: Secondary | ICD-10-CM | POA: Diagnosis not present

## 2023-04-24 NOTE — BH Specialist Note (Signed)
 Integrated Behavioral Health Initial In-Person Visit  MRN: [id number] Name: Lori Schmidt  Number of Integrated Behavioral Health Clinician visits: 1- Initial Visit  Session Start time: 1430    Session End time: 1500  Total time in minutes: 30   Types of Service: Introduction only  Interpretor:No. Interpretor Name and Language: N/A   Warm Hand Off Completed.        Subjective: [name] is a 48 y.o. female accompanied by  50 year old daughter. Patient was referred by Lori Sharper DO for [facility]. Patient reports the following symptoms/concerns: Patient states she is experiencing grief and a lot of changes. The Licensed Clinical Engineer, Building Services (LCSW-A), acting as a Visual Merchandiser [facility]), initiated a session with patient. The [facility] introduced themselves, explained her role, and provided contact information to the patient. Confidentiality and mandated reporting were discussed, and the patient denied any suicidal ideations or intent to harm others. The Integrated Behavioral Health (IBH) approach was reviewed, and a PHQ-9 assessment was completed.  Patient requested therapy for her daughter through [facility]. [facility] recommended [facility] and gave patient contact information.  Duration of problem: More than 5 years; Severity of problem: moderate  Objective: Mood: Depressed and Affect: Appropriate Risk of harm to self or others: No plan to harm self or others  Life Context: Family and Social: Daughter   Patient and/or Family's Strengths/Protective Factors: Sense of purpose  Goals Addressed: Patient will: Reduce symptoms of:  Grief  Progress towards Goals: Ongoing  Interventions: Interventions utilized: CBT Cognitive Behavioral Therapy  Standardized Assessments completed: PHQ-SADS     04/24/2023    2:41 PM 04/20/2023   11:38 AM 02/13/2023   10:29 AM  PHQ-SADS Last 3 Score only  Total GAD-7 Score   8  PHQ Adolescent  Score 8 1 7      Patient and/or Family Response: Patient agrees to services  Patient Centered Plan: Patient is on the following Treatment Plan(s):  Will establish on next visit  Assessment: Patient currently experiencing Grief.   Patient may benefit from OPT.  Plan: Follow up with behavioral health clinician on : Patient scheduled for 01/29 in person Lori Schmidt, MSW, LCSW-A She/Her Behavioral Health Clinician [facility]  Internal Medicine Center Direct Dial :[phone]  Fax [phone] Main Office Phone: [phone] [address] [place] [place], [place] [address] Website: [facility] Internal Medicine [facility]  [place], [place]  [facility]

## 2023-05-08 ENCOUNTER — Telehealth: Payer: Self-pay | Admitting: *Deleted

## 2023-05-08 NOTE — Telephone Encounter (Signed)
Received call from patient regarding her PCS request. Patient  states she mainly needs for someone to come to clean her house. Her daughter she states , she helps her with her bath and helps with other things she needs done.  States she really done need help for bathing and personal care. PCS request referral closed.

## 2023-05-10 ENCOUNTER — Ambulatory Visit: Payer: Medicaid Other | Admitting: Licensed Clinical Social Worker

## 2023-05-10 DIAGNOSIS — F4323 Adjustment disorder with mixed anxiety and depressed mood: Secondary | ICD-10-CM | POA: Diagnosis present

## 2023-05-10 NOTE — BH Specialist Note (Signed)
Integrated Behavioral Health Follow Up In-Person Visit  MRN: [id number] Name: Lori Schmidt  Number of Integrated Behavioral Health Clinician visits: 2- Second Visit  Session Start time: 1330   Session End time: 1430  Total time in minutes: 60   Types of Service: Individual psychotherapy  Interpretor:No. Interpretor Name and Language: N/A  Subjective: Lori Schmidt is a 48 y.o. female    Patient was referred by PCP for Behavioral.  Patient reports the following symptoms/concerns: Today, the LCSW-A and [employer] Consultant conducted an in-person session with the patient. The patient identified their 88 year old son, Lori Schmidt, as the primary source of stress, noting that all the household dishes are in his room. The patient spent most of the session discussing challenges related to their son and expressed frustration about the situation. The patient also briefly mentioned difficulties with their daughter. Together, the [facility] and the patient explored coping skills and strategies to improve communication with their children. The session focused on helping the patient develop tools to manage stress and foster healthier family dynamics. Objective: Mood: Depressed and Irritable and Affect: Appropriate Risk of harm to self or others: No plan to harm self or others  Life Context: Family and Social: Daughter and Son   Patient and/or Family's Strengths/Protective Factors: Social connections  Goals Addressed: Patient will:  Reduce symptoms of: depression and stress    Progress towards Goals: Ongoing  Interventions: Interventions utilized:  CBT Cognitive Behavioral Therapy Standardized Assessments completed: PHQ-SADS    04/24/2023    2:41 PM 04/20/2023   11:38 AM 02/13/2023   10:29 AM  PHQ-SADS Last 3 Score only  Total GAD-7 Score   8  PHQ Adolescent Score 8 1 7      Patient and/or Family Response: Patient agrees to ongoing services  Patient Centered  Plan: Patient is on the following Treatment Plan(s): OPT Assessment: Patient currently experiencing Depression and stress .   Patient may benefit from OPT.  Plan: Follow up with behavioral health clinician on : 06/07/2023  Christen Butter, MSW, LCSW-A She/Her Behavioral Health Clinician [facility]  Internal Medicine Center Direct Dial:[phone]  Fax [phone] Main Office Phone: [phone] [address] [place] [place], [place] [address] Website: [facility] Internal Medicine [facility]  [place], [place]  [place]

## 2023-05-22 ENCOUNTER — Telehealth: Payer: Self-pay | Admitting: Internal Medicine

## 2023-05-22 ENCOUNTER — Ambulatory Visit: Payer: Medicaid Other | Admitting: Internal Medicine

## 2023-05-22 VITALS — BP 151/70 | HR 106 | Temp 97.9°F | Ht 67.0 in | Wt 344.3 lb

## 2023-05-22 DIAGNOSIS — Z794 Long term (current) use of insulin: Secondary | ICD-10-CM

## 2023-05-22 DIAGNOSIS — Z3042 Encounter for surveillance of injectable contraceptive: Secondary | ICD-10-CM | POA: Diagnosis not present

## 2023-05-22 DIAGNOSIS — E1142 Type 2 diabetes mellitus with diabetic polyneuropathy: Secondary | ICD-10-CM

## 2023-05-22 DIAGNOSIS — E119 Type 2 diabetes mellitus without complications: Secondary | ICD-10-CM

## 2023-05-22 DIAGNOSIS — Z0000 Encounter for general adult medical examination without abnormal findings: Secondary | ICD-10-CM

## 2023-05-22 DIAGNOSIS — E785 Hyperlipidemia, unspecified: Secondary | ICD-10-CM | POA: Diagnosis not present

## 2023-05-22 DIAGNOSIS — R030 Elevated blood-pressure reading, without diagnosis of hypertension: Secondary | ICD-10-CM | POA: Diagnosis not present

## 2023-05-22 DIAGNOSIS — Z1231 Encounter for screening mammogram for malignant neoplasm of breast: Secondary | ICD-10-CM

## 2023-05-22 DIAGNOSIS — D509 Iron deficiency anemia, unspecified: Secondary | ICD-10-CM | POA: Diagnosis not present

## 2023-05-22 MED ORDER — SITAGLIPTIN PHOSPHATE 25 MG PO TABS: 25.0000 mg | ORAL_TABLET | Freq: Every day | ORAL | 1 refills | Status: DC

## 2023-05-22 MED ORDER — MEDROXYPROGESTERONE ACETATE 104 MG/0.65ML ~~LOC~~ SUSY
104.0000 mg | PREFILLED_SYRINGE | Freq: Once | SUBCUTANEOUS | Status: AC
  Administered 2023-05-22: 104 mg via SUBCUTANEOUS

## 2023-05-22 MED ORDER — PREGABALIN 100 MG PO CAPS: 100.0000 mg | ORAL_CAPSULE | Freq: Three times a day (TID) | ORAL | 1 refills | Status: DC

## 2023-05-22 NOTE — Assessment & Plan Note (Addendum)
 Last A1c was 8.3%.  Patient declined POC A1c and preferred to get it done via lab work, although now we do not have the results.  The patient states that she has been wearing her Dexcom intermittently, but is wearing it right now.  She denies any episodes of hypoglycemia, and is aware of the symptoms of this.  She takes Lantus  100 units twice a day.  She is not on metformin  or GLP-1 due to GI side effects, and is not on an SGLT2 inhibitor due to DKA/yeast infections.   Plan: -Continue Lantus  100 units twice daily (if higher doses of insulin  are needed, would switch to degludec) -Start sitagliptin  25 mg daily - Consider trying Ozempic  again/trying Mounjaro to also help with weight loss - Follow-up in 3 months

## 2023-05-22 NOTE — Addendum Note (Signed)
 Addended by: Tammie Ellsworth on: 05/22/2023 03:21 PM   Modules accepted: Orders

## 2023-05-22 NOTE — Assessment & Plan Note (Signed)
-   Depo-Provera  shot provided today, next due by May 19 - Discussed colonoscopy, but patient has a lot medically that is going on right now, so we will defer this to another visit

## 2023-05-22 NOTE — Progress Notes (Signed)
 CC: diabetes  HPI:  [name] is a 48 y.o. female living with a history stated below and presents today for a follow up of her diabetes. Please see problem based assessment and plan for additional details.  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     Blood Glucose Monitoring Suppl (ACCU-CHEK GUIDE) w/Device KIT Use to check blood sugar up to 3 times daily as needed 1 kit 1   citalopram  (CELEXA ) 40 MG tablet TAKE 1 TABLET BY MOUTH EVERYDAY AT BEDTIME 90 tablet 4   Continuous Blood Gluc Sensor (DEXCOM G7 SENSOR) MISC As directed 3 each 11   ferrous sulfate  325 (65 FE) MG tablet Take 1 tablet (325 mg total) by mouth every other day. 15 tablet 3   glucose blood (ACCU-CHEK GUIDE) test strip Check blood sugar up to 3 times daily as needed 100 each 12   insulin  glargine (LANTUS ) 100 UNIT/ML Solostar Pen Inject 100 Units into the skin 2 (two) times daily. 60 mL 11   Insulin  Pen Needle 32G X 4 MM MISC Use twice daily to inject insulin  120 each 11   pravastatin  (PRAVACHOL ) 40 MG tablet Take 1 tablet (40 mg total) by mouth daily. 30 tablet 11   No current facility-administered medications on file prior to visit.    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 Mother is [place]   Social Drivers of Health   Financial Resource Strain: Low Risk  (04/20/2023)   Overall Financial Resource Strain (CARDIA)    Difficulty of Paying Living Expenses: Not very hard  Food Insecurity: No Food Insecurity (04/20/2023)   Hunger Vital Sign    Worried About Running Out of Food in the Last Year: Never true    Ran Out of Food in the Last Year: Never true  Transportation Needs:  No Transportation Needs (04/20/2023)   PRAPARE - Administrator, Civil Service (Medical): No    Lack of Transportation (Non-Medical): No  Physical Activity: Inactive (04/20/2023)   Exercise Vital Sign    Days of Exercise per Week: 0 days    Minutes of Exercise per Session: 0 min  Stress: Stress Concern Present (04/20/2023)   [employer] of Occupational Health - Occupational Stress Questionnaire    Feeling of Stress : Very much  Social Connections: Moderately Isolated (04/20/2023)   Social Connection and Isolation Panel [NHANES]    Frequency of Communication with Friends and Family: More than three times a week    Frequency of Social Gatherings with Friends and Family: Once a week    Attends Religious Services: Never    Database administrator or Organizations: Yes    Attends Banker Meetings:  Never    Marital Status: Divorced  Catering manager Violence: Not At Risk (04/20/2023)   Humiliation, Afraid, Rape, and Kick questionnaire    Fear of Current or Ex-Partner: No    Emotionally Abused: No    Physically Abused: No    Sexually Abused: No    Review of Systems: ROS negative except for what is noted on the assessment and plan.  Vitals:   05/22/23 1003 05/22/23 1027  BP: (!) 157/93 (!) 151/70  Pulse: 98 (!) 106  Temp: 97.9 F (36.6 C)   TempSrc: Oral   SpO2: 99%   Weight: (!) 344 lb 4.8 oz (156.2 kg)   Height: 5\' 7"  (1.702 m)     Physical Exam: Constitutional: appears well, in no acute distressed Cardiovascular: regular rate and rhythm, no m/r/g Pulmonary/Chest: normal work of breathing on room air Abdominal: soft, non-tender, non-distended MSK: s/p bilateral bka  Skin: warm and dry Psych: normal mood and behavior  Assessment & Plan:    Patient discussed with Dr. Bettejane Brownie  Uncontrolled type 2 diabetes mellitus on insulin  (HCC) Last A1c was 8.3%.  Patient declined POC A1c and preferred to get it done via lab work, although now we do not have the results.  The patient states that she has been wearing her Dexcom intermittently, but is wearing it right now.  She denies any episodes of hypoglycemia, and is aware of the symptoms of this.  She takes Lantus  100 units twice a day.  She is not on metformin  or GLP-1 due to GI side effects, and is not on an SGLT2 inhibitor due to DKA/yeast infections.   Plan: -Continue Lantus  100 units twice daily (if higher doses of insulin  are needed, would switch to degludec) -Start sitagliptin  25 mg daily - Consider trying Ozempic  again/trying Mounjaro to also help with weight loss - Follow-up in 3 months  Dyslipidemia Compliant with pravastatin  40 mg daily.  Will check lipid profile today.  Health care maintenance - Depo-Provera  shot provided today, next due by May 19 - Discussed colonoscopy, but patient has a lot medically  that is going on right now, so we will defer this to another visit  Iron Deficiency Anemia Last CBC and iron studies were done in May.  The patient is unsure if she is still taking iron sulfate every day.  Will recheck iron studies before represcribing iron supplement.  Elevated BP without diagnosis of hypertension Blood pressure today is elevated to 151/70.  The patient is status post bilateral BKA's, and has recently started walking again.  She suspects that her elevated blood pressure is due to this, as  well as ongoing stress, as her son recently moved out last week.  Will not start antihypertensive today, but discussed if her blood pressure remains high, we may need to start an agent.  She is also going to work on weight loss, which will help with her blood pressure.  Encounter for birth control Depo-Provera  injection today.  Next Depo due in May.   Marcina Kinnison, D.O. [facility] Internal Medicine, PGY-3 Phone: [phone] Date 05/22/2023 Time 11:58 AM

## 2023-05-22 NOTE — Assessment & Plan Note (Signed)
 Blood pressure today is elevated to 151/70.  The patient is status post bilateral BKA's, and has recently started walking again.  She suspects that her elevated blood pressure is due to this, as well as ongoing stress, as her son recently moved out last week.  Will not start antihypertensive today, but discussed if her blood pressure remains high, we may need to start an agent.  She is also going to work on weight loss, which will help with her blood pressure.

## 2023-05-22 NOTE — Assessment & Plan Note (Signed)
 Compliant with pravastatin  40 mg daily.  Will check lipid profile today.

## 2023-05-22 NOTE — Telephone Encounter (Signed)
 This patient is overdue for her yearly Mammogram and she was seen today in your clinic. her last Mammogram on file is as follows with the [facility]   MM 3D SCREEN BREAST BILATERAL (Accession [id number]) (Order [id number]) Imaging Date: 09/08/2020 Department: [facility]    Can a new order be placed for this patient?

## 2023-05-22 NOTE — Assessment & Plan Note (Signed)
 Depo-Provera  injection today.  Next Depo due in May.

## 2023-05-22 NOTE — Assessment & Plan Note (Signed)
 Last CBC and iron studies were done in May.  The patient is unsure if she is still taking iron sulfate every day.  Will recheck iron studies before represcribing iron supplement.

## 2023-05-22 NOTE — Patient Instructions (Signed)
 Thank you, Ms.Donzetta Galli for allowing us  to provide your care today. Today we discussed:  Diabetes Keep taking lantus  100u twice a day and keep wearing your dexcom so we make sure you do not have any episodes of LOW blood sugars Start januvia  once daily Next time, we can consider starting ozempic  or mounjaro - these medications can also help with weight loss (and we start at low doses so you hopefully don't get as many side effects) Checking your cholesterol and iron levels today  Depo today  Refilled pregabalin  for your phantom pain  I have ordered the following labs for you:   Lab Orders         Hemoglobin A1c         Lipid Profile         CBC no Diff         Ferritin         Iron and IBC ([id number])        Referrals ordered today:   Referral Orders  No referral(s) requested today     I have ordered the following medication/changed the following medications:   Stop the following medications: Medications Discontinued During This Encounter  Medication Reason   pregabalin  (LYRICA ) 100 MG capsule Reorder     Start the following medications: Meds ordered this encounter  Medications   sitaGLIPtin  (JANUVIA ) 25 MG tablet    Sig: Take 1 tablet (25 mg total) by mouth daily.    Dispense:  90 tablet    Refill:  1   pregabalin  (LYRICA ) 100 MG capsule    Sig: Take 1 capsule (100 mg total) by mouth 3 (three) times daily.    Dispense:  90 capsule    Refill:  1    Not to exceed 4 additional fills before 09/17/2023     Follow up: 3 months    Should you have any questions or concerns please call the internal medicine clinic at [phone].     Mikhail Hallenbeck, D.O. [facility] Internal Medicine Center

## 2023-05-23 LAB — LIPID PANEL
Chol/HDL Ratio: 3.9 {ratio} (ref 0.0–4.4)
Cholesterol, Total: 177 mg/dL (ref 100–199)
HDL: 45 mg/dL (ref >39)
LDL Chol Calc (NIH): 101 mg/dL — ABNORMAL HIGH (ref 0–99)
Triglycerides: 179 mg/dL — ABNORMAL HIGH (ref 0–149)
VLDL Cholesterol Cal: 31 mg/dL (ref 5–40)

## 2023-05-23 LAB — CBC
Hematocrit: 42.8 % (ref 34.0–46.6)
Hemoglobin: 13.3 g/dL (ref 11.1–15.9)
MCH: 26.5 pg — ABNORMAL LOW (ref 26.6–33.0)
MCHC: 31.1 g/dL — ABNORMAL LOW (ref 31.5–35.7)
MCV: 85 fL (ref 79–97)
Platelets: 270 10*3/uL (ref 150–450)
RBC: 5.01 x10E6/uL (ref 3.77–5.28)
RDW: 14 % (ref 11.7–15.4)
WBC: 9.6 10*3/uL (ref 3.4–10.8)

## 2023-05-23 LAB — IRON AND TIBC
Iron Saturation: 16 % (ref 15–55)
Iron: 61 ug/dL (ref 27–159)
Total Iron Binding Capacity: 377 ug/dL (ref 250–450)
UIBC: 316 ug/dL (ref 131–425)

## 2023-05-23 LAB — HEMOGLOBIN A1C
Est. average glucose Bld gHb Est-mCnc: 220 mg/dL
Hgb A1c MFr Bld: 9.3 % — ABNORMAL HIGH (ref 4.8–5.6)

## 2023-05-23 LAB — FERRITIN: Ferritin: 165 ng/mL — ABNORMAL HIGH (ref 15–150)

## 2023-05-23 NOTE — Progress Notes (Signed)
Attempted to call patient about results. A1c increased to 9.3% - we started sitagliptin at this office visit. LDL 101, she takes pravastatin 40 mg daily. Hb and iron studies wnl.

## 2023-05-25 NOTE — Progress Notes (Signed)
Internal Medicine Clinic Attending  Case discussed with the resident at the time of the visit.  We reviewed the resident's history and exam and pertinent patient test results.  I agree with the assessment, diagnosis, and plan of care documented in the resident's note.

## 2023-06-01 ENCOUNTER — Ambulatory Visit: Payer: Medicaid Other

## 2023-06-07 ENCOUNTER — Ambulatory Visit (INDEPENDENT_AMBULATORY_CARE_PROVIDER_SITE_OTHER): Payer: Medicaid Other | Admitting: Licensed Clinical Social Worker

## 2023-06-07 ENCOUNTER — Telehealth: Payer: Self-pay | Admitting: Dietician

## 2023-06-07 DIAGNOSIS — F4323 Adjustment disorder with mixed anxiety and depressed mood: Secondary | ICD-10-CM

## 2023-06-07 NOTE — BH Specialist Note (Signed)
 Integrated Behavioral Health Follow Up In-Person Visit  MRN: [id number] Name: Lori Schmidt  Number of Integrated Behavioral Health Clinician visits: Additional Visit  Session Start time: 1340   Session End time: 1430  Total time in minutes: 60   Types of Service: Individual psychotherapy and General Behavioral Integrated Care (BHI)  Interpretor:No. Interpretor Name and Language: N/A  Subjective: Lori Schmidt is a 48 y.o. female Patient was referred by PCP for [facility]. Patient reports the following symptoms/concerns: During today's session, the Behavioral Health Consultant [facility]) and patient discussed several aspects of the patient's life and personal development. The patient shared updates on family dynamics, including their son moving out and securing employment, as well as their daughter's academic success coupled with minor conflicts. The patient also mentioned ongoing conflicts within the family. The [facility] and patient explored the patient's goals, recognizing their natural skills in listening and counseling. As a result, the patient expressed interest in pursuing counseling certifications and will look into available options. The session noted the patient's continued progress through the stages of change. The next appointment is scheduled for July 05, 2023, at 1:30 PM, and will be conducted in person.   Objective: Mood: Depressed and Affect: Appropriate Risk of harm to self or others: No plan to harm self or others   Patient and/or Family's Strengths/Protective Factors: Social connections  Goals Addressed: Patient will:  Reduce symptoms of: Stages of change  Progress towards Goals: Ongoing  Interventions: Interventions utilized:  CBT Cognitive Behavioral Therapy Standardized Assessments completed: Not Needed  Patient and/or Family Response: Patient agrees to services   Assessment: Patient currently experiencing Stress due to changes.   Patient may  benefit from ongoing OPT.  Plan: Follow up with behavioral health clinician on : 03/26 at 1:30 pm  Christen Butter, MSW, LCSW-A She/Her Behavioral Health Clinician [facility]  Internal Medicine Center Direct Dial:[phone]  Fax [phone] Main Office Phone: [phone] [address] [place] [place], [place] [address] Website: [facility] Internal Medicine [facility]  [place], [place]  [place]

## 2023-06-07 NOTE — Telephone Encounter (Signed)
 Spoke with patient about support for her diabetes self care. No refill needs identified.

## 2023-06-12 ENCOUNTER — Telehealth: Payer: Self-pay

## 2023-06-12 NOTE — Telephone Encounter (Signed)
 Rec'd PA request from patients pharmacy for Ozempic medication. PA not needed, per insurance: Prior Authorization is not required at this time. Pharmacy needs to submit override codes for Drug Utilization Review.  However, can medication be added to patients med list?

## 2023-06-13 ENCOUNTER — Other Ambulatory Visit: Payer: Self-pay | Admitting: Student

## 2023-06-13 ENCOUNTER — Other Ambulatory Visit: Payer: Self-pay | Admitting: Internal Medicine

## 2023-06-13 DIAGNOSIS — E1169 Type 2 diabetes mellitus with other specified complication: Secondary | ICD-10-CM

## 2023-06-14 ENCOUNTER — Ambulatory Visit
Admission: RE | Admit: 2023-06-14 | Discharge: 2023-06-14 | Disposition: A | Discharge status: Home / Self Care | Payer: Medicaid Other | Source: Ambulatory Visit | Attending: Internal Medicine | Admitting: Internal Medicine

## 2023-06-14 DIAGNOSIS — Z1231 Encounter for screening mammogram for malignant neoplasm of breast: Secondary | ICD-10-CM

## 2023-06-29 ENCOUNTER — Encounter: Payer: Self-pay | Admitting: Internal Medicine

## 2023-06-29 NOTE — Progress Notes (Signed)
 Sent MyChart message. Mammogram results normal.

## 2023-07-05 ENCOUNTER — Ambulatory Visit (INDEPENDENT_AMBULATORY_CARE_PROVIDER_SITE_OTHER): Admitting: Licensed Clinical Social Worker

## 2023-07-05 DIAGNOSIS — F4323 Adjustment disorder with mixed anxiety and depressed mood: Secondary | ICD-10-CM | POA: Diagnosis present

## 2023-07-05 NOTE — Patient Instructions (Signed)
 Visit Information  Thank you for taking time to visit with me today. Please don't hesitate to contact me if I can be of assistance to you.   Following are the goals we discussed today:   Practice active listening techniques with family members to improve communication and reduce misunderstandings. Implement the A-B-C-D Model for anger management, focusing on identifying and disputing irrational beliefs that lead to anger. Utilize "I" statements when expressing feelings to avoid blame and reduce conflict escalation.  Establish clear boundaries with family members, particularly regarding acceptable behaviors and communication. Develop a timeout strategy to use when conflicts begin to escalate, allowing time to cool down and regroup. Work on identifying personal triggers for anger and develop strategies to manage these situations.  Consider attending an anger management course or family therapy sessions to further develop conflict resolution skills. Practice empathy by trying to understand other family members' perspectives during conflicts. Implement a family conflict resolution process that includes brainstorming solutions and being willing to compromise. Focus on forgiveness and letting go of minor issues to prevent unnecessary conflicts.  Christen Butter, MSW, LCSW-A She/Her Behavioral Health Clinician [facility]  Internal Medicine Center Direct [phone]  Fax [phone] Main Office Phone: [phone] [address] [place], [place] [address] Website: [facility] Internal Medicine [facility]  [place], [place]  [place]

## 2023-07-05 NOTE — BH Specialist Note (Signed)
 Integrated Behavioral Health Follow Up In-Person Visit  MRN: [id number] Name: Lori Schmidt  Number of Integrated Behavioral Health Clinician visits: Additional Visit  Session Start time: 1330   Session End time: 1430  Total time in minutes: 60   Types of Service: Individual psychotherapy and General Behavioral Integrated Care (BHI)  Interpretor:No. Interpretor Name and Language: N/A  Subjective: Lori Schmidt is a 48 y.o. female  Patient was referred by PCP for [facility]. Patient reports the following symptoms/concerns: The Behavioral Health Consultant [facility]) conducted a face-to-face session with the patient, during which the patient discussed ongoing family conflicts involving her mother and sister. The patient expressed feelings of inequity, stating that her mother is more supportive of her sister than of her. She shared that she attended church recently and received a message about mending relationships, which motivated her to reach out to her mother. However, her mother declined the opportunity to talk, leaving the patient feeling discouraged. Despite this setback, the patient remains open to exploring ways to improve communication and repair familial relationships.  The patient also provided updates on her children. She reported that her son is doing better, has secured a job, and is progressing independently, which she views as a positive development. However, she noted that her daughter's behavioral issues have escalated since her son left home. The patient disclosed that her daughter has been verbally disrespectful, calling her a "bitch," which has been emotionally challenging for her. On a hopeful note, the patient shared that her daughter recently started working at [employer] and expressed optimism that the job might provide structure and responsibility for her.  During the session, the [place] and the patient processed potential solutions and techniques to address these  challenges. They explored strategies for managing family conflict and improving communication with both her mother and daughter. The patient also expressed a desire to work on anger management and temper control skills to better navigate these situations. The [facility] encouraged the patient to continue focusing on these areas and provided support in developing actionable steps.    Objective: Mood: NA and Affect: Appropriate Risk of harm to self or others: No plan to harm self or others  Life Context: Family and Social: Patient has a supportive 60 year old daughter. School/Work: Patient is disabled Self-Care: Going to have lunch with a parent friend. Life Changes: Son has moved out and daughters emotional responses are changes.  Patient and/or Family's Strengths/Protective Factors: Sense of purpose  Goals Addressed: Patient will:  Reduce symptoms of: stress   Progress towards Goals: Ongoing  Interventions: Interventions utilized:  Mindfulness or Relaxation Training and CBT Cognitive Behavioral Therapy Standardized Assessments completed: PHQ-SADS    07/05/2023    2:15 PM 07/05/2023    2:14 PM 04/24/2023    2:41 PM  PHQ-SADS Last 3 Score only  Total GAD-7 Score 3    PHQ Adolescent Score  2 8     Patient and/or Family Response: Patient experiencing family conflict and financial stress.  Patient Centered Plan: Patient is on the following Treatment Plan(s): OPT  Assessment: Patient currently experiencing Financial and family conflict.   Patient may benefit from OPT.  Plan: Follow up with behavioral health clinician on :   Christen Butter, MSW, LCSW-A She/Her Behavioral Health Clinician [facility]  Internal Medicine Center Direct Dial:[phone]  Fax [phone] Main Office Phone: [phone] [address] [place] [place], [place] [address] Website: [facility] Internal Medicine [facility]  [place], [place]  [place]

## 2023-08-09 ENCOUNTER — Ambulatory Visit (INDEPENDENT_AMBULATORY_CARE_PROVIDER_SITE_OTHER): Admitting: Licensed Clinical Social Worker

## 2023-08-09 DIAGNOSIS — F4323 Adjustment disorder with mixed anxiety and depressed mood: Secondary | ICD-10-CM

## 2023-08-09 NOTE — BH Specialist Note (Signed)
 Integrated Behavioral Health Follow Up In-Person Visit  MRN: [id number] Name: Lori Schmidt  Number of Integrated Behavioral Health Clinician visits: Additional Visit  Session Start time: 1330   Session End time: 1430  Total time in minutes: 60   Types of Service: Individual psychotherapy and Health & Behavioral Assessment/Intervention  Interpretor:No. Interpretor Name and Language: N/A  Subjective: Lori Schmidt is a 48 y.o. female  Patient was referred by PCP   Patient reports the following symptoms/concerns:Patient requested information on how someone can become a personal care worker for a family member. [facility] provided information.   Psychology Complete the Medicaid Application First, your family member needs to be enrolled in [place] and qualify for the Personal Care Services (PCS) or Home and Community-Based Services (HCBS) Waiver. They need to go through the eligibility process by providing medical documentation to prove the need for assistance with activities of daily living.  Form Needed: The Request for Independent Assessment for Personal Care Services Attestation of Medical Need (Form 930-166-6048) is used to request Medicaid's determination for personal care services. This must be signed by a physician.  Form Link: Form 3051 - This form is used to start the process of applying for personal care services, which is the foundation for hiring a family member as a paid caregiver.  For the remaining of the session, [facility] and patient processed the emotional conflict occurring between patient and patients daughter. [facility] recommends family counseling and [facility] recommends patient research PCS services.     Objective: Mood:  Sad  and Affect: Appropriate Risk of harm to self or others: No plan to harm self or others   Patient and/or Family's Strengths/Protective Factors: Social connections  Goals Addressed: Patient will:  Reduce symptoms of: mood instability   Progress  towards Goals: Ongoing  Interventions: Interventions utilized:  Mindfulness or Relaxation Training Standardized Assessments completed: Not Needed  Patient and/or Family Response: Patient agrees to family therapy and ongoing OPT  Assessment: Patient currently experiencing Stages of change and family conflict.   Patient may benefit from OPT, PCS services  and family therapy.  Plan: Follow up with behavioral health clinician on : within the next 30 days.  Amie Bald, MSW, LCSW-A She/Her Behavioral Health Clinician [facility]  Internal Medicine Center Direct Dial [phone]  Fax [phone] Main Office Phone: [phone] [address] [place] [place], [place] [address] Website: [facility] Internal Medicine [facility]  [place], [place]  [facility]

## 2023-08-21 ENCOUNTER — Ambulatory Visit: Payer: Medicaid Other | Admitting: Student

## 2023-08-21 VITALS — BP 132/76 | HR 109 | Temp 98.5°F | Ht 67.0 in | Wt 357.7 lb

## 2023-08-21 DIAGNOSIS — Z89512 Acquired absence of left leg below knee: Secondary | ICD-10-CM

## 2023-08-21 DIAGNOSIS — Z794 Long term (current) use of insulin: Secondary | ICD-10-CM | POA: Diagnosis not present

## 2023-08-21 DIAGNOSIS — E785 Hyperlipidemia, unspecified: Secondary | ICD-10-CM | POA: Diagnosis not present

## 2023-08-21 DIAGNOSIS — E119 Type 2 diabetes mellitus without complications: Secondary | ICD-10-CM

## 2023-08-21 DIAGNOSIS — Z7984 Long term (current) use of oral hypoglycemic drugs: Secondary | ICD-10-CM

## 2023-08-21 DIAGNOSIS — E1165 Type 2 diabetes mellitus with hyperglycemia: Secondary | ICD-10-CM | POA: Diagnosis present

## 2023-08-21 DIAGNOSIS — F419 Anxiety disorder, unspecified: Secondary | ICD-10-CM | POA: Diagnosis not present

## 2023-08-21 MED ORDER — MEDROXYPROGESTERONE ACETATE 104 MG/0.65ML ~~LOC~~ SUSY
104.0000 mg | PREFILLED_SYRINGE | Freq: Once | SUBCUTANEOUS | Status: AC
Start: 2023-08-21 — End: 2023-08-21
  Administered 2023-08-21: 104 mg via SUBCUTANEOUS

## 2023-08-21 MED ORDER — METFORMIN HCL ER 500 MG PO TB24: 500.0000 mg | ORAL_TABLET | Freq: Every day | ORAL | 1 refills | Status: DC

## 2023-08-21 NOTE — Progress Notes (Signed)
 CC:  Chief Complaint  Patient presents with   Follow-up    Routine office 3 month follow up for dm and depo shot   HPI:  [name] is a 48 y.o. female living with a history stated below and presents today for the above. Please see problem based assessment and plan for additional details.  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     Blood Glucose Monitoring Suppl (ACCU-CHEK GUIDE) w/Device KIT Use to check blood sugar up to 3 times daily as needed 1 kit 1   citalopram  (CELEXA ) 40 MG tablet TAKE 1 TABLET BY MOUTH EVERYDAY AT BEDTIME 90 tablet 4   Continuous Glucose Sensor (DEXCOM G7 SENSOR) MISC USE AS DIRECTED 3 each 11   ferrous sulfate  325 (65 FE) MG tablet Take 1 tablet (325 mg total) by mouth every other day. 15 tablet 3   glucose blood (ACCU-CHEK GUIDE) test strip Check blood sugar up to 3 times daily as needed 100 each 12   insulin  glargine (LANTUS  SOLOSTAR) 100 UNIT/ML Solostar Pen INJECT 100 UNITS INTO THE SKIN 2 (TWO) TIMES DAILY. 60 mL 11   Insulin  Pen Needle 32G X 4 MM MISC Use twice daily to inject insulin  120 each 11   pravastatin  (PRAVACHOL ) 40 MG tablet Take 1 tablet (40 mg total) by mouth daily. 30 tablet 11   pregabalin  (LYRICA ) 100 MG capsule Take 1 capsule (100 mg total) by mouth 3 (three) times daily. 90 capsule 1   Semaglutide ,0.25 or 0.5MG /DOS, (OZEMPIC , 0.25 OR 0.5 MG/DOSE,) 2 MG/1.5ML SOPN Inject into the skin.     sitaGLIPtin   (JANUVIA ) 25 MG tablet Take 1 tablet (25 mg total) by mouth daily. 90 tablet 1   No current facility-administered medications on file prior to visit.    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 Mother is Lori Schmidt   Social Drivers of Health   Financial Resource Strain: Low Risk  (04/20/2023)   Overall Financial Resource Strain (CARDIA)    Difficulty of Paying Living Expenses: Not very hard  Food Insecurity: No Food Insecurity (04/20/2023)   Hunger Vital Sign    Worried About Running Out of Food in the Last Year: Never true    Ran Out of Food in the Last Year: Never true  Transportation Needs: No Transportation Needs (04/20/2023)   PRAPARE - Administrator, Civil Service (Medical): No    Lack of Transportation (Non-Medical): No  Physical Activity: Inactive (04/20/2023)   Exercise Vital Sign    Days of Exercise per Week: 0 days    Minutes of Exercise per Session: 0 min  Stress: Stress Concern Present (04/20/2023)   [employer] of Occupational Health - Occupational Stress Questionnaire     Feeling of Stress : Very much  Social Connections: Moderately Isolated (04/20/2023)   Social Connection and Isolation Panel [NHANES]    Frequency of Communication with Friends and Family: More than three times a week    Frequency of Social Gatherings with Friends and Family: Once a week    Attends Religious Services: Never    Database administrator or Organizations: Yes    Attends Banker Meetings: Never    Marital Status: Divorced  Catering manager Violence: Not At Risk (04/20/2023)   Humiliation, Afraid, Rape, and Kick questionnaire    Fear of Current or Ex-Partner: No    Emotionally Abused: No    Physically Abused: No    Sexually Abused: No    Review of Systems: ROS negative except for what is noted on the assessment and plan.  Vitals:   08/21/23 0907  BP: 132/76  Pulse: (!) 109  Temp: 98.5 F (36.9 C)  TempSrc: Oral  SpO2: 100%  Weight: (!) 357 lb 11.2 oz (162.3 kg)  Height: 5\' 7"  (1.702 m)    Physical Exam: Constitutional: well-appearing, in no acute distress HENT: normocephalic atraumatic, mucous membranes moist Eyes: conjunctiva non-erythematous Cardiovascular: mildly tachycardic, regular rhythm, no m/r/g Pulmonary/Chest: normal work of breathing on room air, lungs clear to auscultation bilaterally Abdominal: soft, non-tender, non-distended MSK: normal bulk and tone Neurological: alert & oriented x 3, no focal deficit Skin: warm and dry Psych: depressed mood  Assessment & Plan:   Patient discussed with Dr. Broadus Canes  Uncontrolled type 2 diabetes mellitus on insulin  [facility]) Patient has a history of type 2 diabetes last A1c was 9.33 months ago was 15 November 2022.  It has dropped.  Patient has also gained about 15 pounds in the last 2 months.  She is using glargine 100 units twice daily, Januvia  25 mg daily.,  She did not tolerate Ozempic  due to GI side effects.  She could not tolerate metformin  because of GI side effects.  She states that she wants to lose  weight.  It has been difficult because of all the social stressors she has at home.  She is currently on citalopram  40 mg which is the highest dose and also getting behavioral therapy.  She says that she has been eating a lot and this has mainly been exacerbated from the stress that she has been having.  She is also very stressed because she lost her job, and having problems with her teenagers at home that did not help her with the house.  She has a BKA.  In the past she has  refused having an aide as she says that most of the help that she needs is cleaning at home.  She does have 6 pets.  She does not remember what was the dose of metformin  that she cannot tolerate.  She does not remember if she uptitrated the dose.  She does not remember if she started taking metformin  along with Ozempic .  On chart review it seems that metformin  was added first.  I did counsel her and told her that we are running out of options to offer her besides of her insulin .  She is willing to try metformin  Xl again and stay at a low dose at this time.  She just to try it for at least a week and see if her GI side effects subside after that.  She unfortunately does not remember what she was doing in August of last year when her A1c was around 6.  I did counsel her that she would benefit from eating healthier and also able to do some exercise.  Unfortunately she is undergoing a lot of financial difficulties also makes it hard for her to afford food that both her teenagers and her would be able to eat.  I did encourage her to at least try moving and having more exercise as she would benefit from this.  She may benefit from having water based workouts.  She does have significant stress and depression and I do think she would benefit from further management.  She does admit that her mood and attitudes toward her problems have a lot to do with how much she has let go in the last year.  -Continue with glargine 100 units twice daily -Continue  with Januvia  25 mg daily -Patient unable to tolerate Ozempic  due to GI side effects -Start metformin  XL 500 mg once a day -Lifestyle modification -Continue going to [place]   S/P BKA (below knee amputation), left [facility]) Patient states that her prosthesis are very painful.  However she does not want to pursue a new prosthesis at this time given that she has gained weight and would like to lower her weight first.  Dyslipidemia LDL 101 in February. Continue with pravastatin  40 mg daily.  Anxiety Patient is currently on citalopram  40 mg daily.  She states that she has been having a lot of stressors including her children who are teenagers are not helping around the house.  She also lost her job about a year or so ago.  She is also undergoing financial stress.  She is having a lot of symptoms of guilt and depression as well.  She follows with [facility] and has found this to be minimally beneficial.  She does not have any SI or HI today.  Considered changing citalopram  to cymbalta given her neuropathy but benefit for depression and axiety would be less than citalopram . I have given her the walking hours for the psychiatry clinic at [address] in case she would ever want to see a psychiatrist for further management of her depression/anxiety.    -Cw seeing integrative behavioral health  -cw citalopram  40mg  daily  -urgent walk in [facility] clinic phone number and address provided   Jose Ngo, MD [facility] Internal Medicine, PGY-1 Phone: [phone] Date 08/21/2023 Time 1:22 PM

## 2023-08-21 NOTE — Patient Instructions (Addendum)
 Thank you, Ms.Donzetta Galli for allowing us  to provide your care today.   I have ordered the following labs for you:   Lab Orders         Hemoglobin A1c      I have ordered the following medication/changed the following medications:   Start the following medications: Meds ordered this encounter  Medications   metFORMIN  (GLUCOPHAGE -XR) 500 MG 24 hr tablet    Sig: Take 1 tablet (500 mg total) by mouth daily with breakfast.    Dispense:  30 tablet    Refill:  1     Follow up: 1 month   Remember:    Try the metformin  XR 500mg  daily  Try exercises in the water increasing physical activity  Try eating less carbohydrates -> instead of rice can try cauliflower, instead of bread, oatmeal.   Psych walk-in clinic  Contact Us  Phone [phone]  Address [address] [place], [place] [address]  Hours Open 24/7. No appointment required.  Should you have any questions or concerns please call the internal medicine clinic at [phone].     Jose Ngo, MD [facility]

## 2023-08-21 NOTE — Assessment & Plan Note (Signed)
 LDL 101 in February. Continue with pravastatin  40 mg daily.

## 2023-08-21 NOTE — Assessment & Plan Note (Signed)
 Patient states that her prosthesis are very painful.  However she does not want to pursue a new prosthesis at this time given that she has gained weight and would like to lower her weight first.

## 2023-08-21 NOTE — Assessment & Plan Note (Signed)
 Patient is currently on citalopram  40 mg daily.  She states that she has been having a lot of stressors including her children who are teenagers are not helping around the house.  She also lost her job about a year or so ago.  She is also undergoing financial stress.  She is having a lot of symptoms of guilt and depression as well.  She follows with [facility] and has found this to be minimally beneficial.  She does not have any SI or HI today.  Considered changing citalopram  to cymbalta given her neuropathy but benefit for depression and axiety would be less than citalopram . I have given her the walking hours for the psychiatry clinic at [address] in case she would ever want to see a psychiatrist for further management of her depression/anxiety.    -Cw seeing integrative behavioral health  -cw citalopram  40mg  daily  -urgent walk in [facility] clinic phone number and address provided

## 2023-08-21 NOTE — Assessment & Plan Note (Signed)
 Patient has a history of type 2 diabetes last A1c was 9.33 months ago was 15 November 2022.  It has dropped.  Patient has also gained about 15 pounds in the last 2 months.  She is using glargine 100 units twice daily, Januvia  25 mg daily.,  She did not tolerate Ozempic  due to GI side effects.  She could not tolerate metformin  because of GI side effects.  She states that she wants to lose weight.  It has been difficult because of all the social stressors she has at home.  She is currently on citalopram  40 mg which is the highest dose and also getting behavioral therapy.  She says that she has been eating a lot and this has mainly been exacerbated from the stress that she has been having.  She is also very stressed because she lost her job, and having problems with her teenagers at home that did not help her with the house.  She has a BKA.  In the past she has refused having an aide as she says that most of the help that she needs is cleaning at home.  She does have 6 pets.  She does not remember what was the dose of metformin  that she cannot tolerate.  She does not remember if she uptitrated the dose.  She does not remember if she started taking metformin  along with Ozempic .  On chart review it seems that metformin  was added first.  I did counsel her and told her that we are running out of options to offer her besides of her insulin .  She is willing to try metformin  Xl again and stay at a low dose at this time.  She just to try it for at least a week and see if her GI side effects subside after that.  She unfortunately does not remember what she was doing in August of last year when her A1c was around 6.  I did counsel her that she would benefit from eating healthier and also able to do some exercise.  Unfortunately she is undergoing a lot of financial difficulties also makes it hard for her to afford food that both her teenagers and her would be able to eat.  I did encourage her to at least try moving and having more  exercise as she would benefit from this.  She may benefit from having water based workouts.  She does have significant stress and depression and I do think she would benefit from further management.  She does admit that her mood and attitudes toward her problems have a lot to do with how much she has let go in the last year.  -Continue with glargine 100 units twice daily -Continue with Januvia  25 mg daily -Patient unable to tolerate Ozempic  due to GI side effects -Start metformin  XL 500 mg once a day -Lifestyle modification -Continue going to [employer]

## 2023-08-22 LAB — HEMOGLOBIN A1C
Est. average glucose Bld gHb Est-mCnc: 226 mg/dL
Hgb A1c MFr Bld: 9.5 % — ABNORMAL HIGH (ref 4.8–5.6)

## 2023-08-23 ENCOUNTER — Ambulatory Visit: Payer: Self-pay | Admitting: Student

## 2023-09-07 NOTE — Progress Notes (Signed)
 Internal Medicine Clinic Attending  Case discussed with the resident at the time of the visit.  We reviewed the resident's history and exam and pertinent patient test results.  I agree with the assessment, diagnosis, and plan of care documented in the resident's note.

## 2023-09-12 ENCOUNTER — Ambulatory Visit: Admitting: Licensed Clinical Social Worker

## 2023-09-12 ENCOUNTER — Telehealth (INDEPENDENT_AMBULATORY_CARE_PROVIDER_SITE_OTHER): Payer: Self-pay | Admitting: Licensed Clinical Social Worker

## 2023-09-12 DIAGNOSIS — F419 Anxiety disorder, unspecified: Secondary | ICD-10-CM

## 2023-09-12 NOTE — Telephone Encounter (Signed)
 [facility] attempted patient on today via telephone regarding 9:30am in-person appointment scheduled on today. [facility] left a VM for patient to contact office back.  Amie Bald, MSW, LCSW-A She/Her Behavioral Health Clinician [facility]  Internal Medicine Center

## 2023-10-03 ENCOUNTER — Ambulatory Visit: Payer: Self-pay

## 2023-10-03 NOTE — Telephone Encounter (Signed)
 FYI Only or Action Required?: Action required by provider: request for appointment.  Patient was last seen in primary care on 08/21/2023 by Volney Leash, MD. Called Nurse Triage reporting Mass. Symptoms began several days ago. Interventions attempted: Rest, hydration, or home remedies. Symptoms are: gradually worsening.  Triage Disposition: See Physician Within 24 Hours  Patient/caregiver understands and will follow disposition?: No, wishes to speak with PCP  Copied from CRM [id number]. Topic: Clinical - Red Word Triage >> Oct 03, 2023 12:51 PM Brittney F wrote: [facility] that prompted transfer to Nurse Triage:   Patient has a painful large pimple on the back of her head and under her right arm; Previously these have led to infection which the patient has been hospitalized for Reason for Disposition  Looks like a boil, infected sore, deep ulcer or other infected rash  Answer Assessment - Initial Assessment Questions 1. APPEARANCE of SWELLING: What does it look like?     Large bump 2. SIZE: How large is the swelling? (e.g., inches, cm; or compare to size of pinhead, tip of pen, eraser, coin, pea, grape, ping pong ball)      Small -pimple size multiple 3. LOCATION: Where is the swelling located?     Back of head, under right arm 4. ONSET: When did the swelling start?     Chronic condition that flairs intermittent. 5. COLOR: What color is it? Is there more than one color?     Red 6. PAIN: Is there any pain? If Yes, ask: How bad is the pain? (e.g., scale 1-10; or mild, moderate, severe)     - NONE (0): no pain   - MILD (1-3): doesn't interfere with normal activities    - MODERATE (4-7): interferes with normal activities or awakens from sleep    - SEVERE (8-10): excruciating pain, unable to do any normal activities     moderate 7. ITCH: Does it itch? If Yes, ask: How bad is the itch?      no 8. CAUSE: What do you think caused the swelling? 9 OTHER  SYMPTOMS: Do you have any other symptoms? (e.g., fever)    Additional info: Has need iv abx in the past for skin lesions wants to get ahead of this before it becomes an issue. Acute evaluation advised but none are available in practice until July, patient refuses UC due to medical conditions-double amputee, requesting to be worked into Financial risk analyst location schedule. Wondering if antibiotics can be sent without an appointment. Declines virtual visit. Please follow up  Protocols used: Skin Lump or Localized Swelling-A-AH

## 2023-10-03 NOTE — Telephone Encounter (Signed)
 Pt was called and informed no available appts this week at this time; pt stated she would like to schedule an appt next week. Call transferred to front office. Appt schedule w/Dr Nguyen on 7/2 @ 3PM.

## 2023-10-11 ENCOUNTER — Ambulatory Visit

## 2023-10-11 VITALS — BP 142/88 | HR 88 | Temp 98.1°F | Ht 67.0 in | Wt 352.0 lb

## 2023-10-11 DIAGNOSIS — L732 Hidradenitis suppurativa: Secondary | ICD-10-CM | POA: Insufficient documentation

## 2023-10-11 DIAGNOSIS — L729 Follicular cyst of the skin and subcutaneous tissue, unspecified: Secondary | ICD-10-CM

## 2023-10-11 MED ORDER — DOXYCYCLINE HYCLATE 100 MG PO CAPS: 100.0000 mg | ORAL_CAPSULE | Freq: Two times a day (BID) | ORAL | 0 refills | Status: AC

## 2023-10-11 NOTE — Patient Instructions (Signed)
 Lori Schmidt, it was very nice to meet you and your daughter today. As we discussed, I am sending a prescription for Doxycycline  to your pharmacy for your HS. Please keep the area clean and dry as possible. We are also sending a referral for Dermatology, please schedule for their first visit available. Do not hesitate to contact us  if you have any questions or concerns!  -Dr. Tyjuan Demetro

## 2023-10-11 NOTE — Assessment & Plan Note (Signed)
 Patient presents with scattered papules and pustules of her right axilla, consistent in presentation with hidradenitis suppurativa. She has a complex history of HS of her right axilla which previously required surgical I&D and multiple rounds of antibiotics in 2020. At that time, she had deep tracks as well. Today on physical exam, no tracks or deep cysts appreciated. Picture uploaded to media tab of patient's chart. Given her complex history of HS, we will begin her on oral Doxycyline. We discussed keeping the area clean and dry. We also discussed that contributing factors such as genetics, weight gain, and diabetes all contribute to the condition. She is working toward optimizing her diabetes and is due in a month for recheck. We will also refer her to Dermatology for her HS.  - Doxycycline  100 mg BID for 7 days  - Dermatology referral

## 2023-10-11 NOTE — Progress Notes (Signed)
 Acute Office Visit  Subjective:     Patient ID: Lori Schmidt, female    DOB: 06/17/1975, 48 y.o.   MRN: [id number]  Chief Complaint  Patient presents with   Skin Problem    Bump scalp and right axillary area   Lori Schmidt is a 48 year old female with a history of poorly controlled DMII, bilateral below the knee amputations, who presents to clinic today for hidradenitis suppurativa flare of her right axilla, and scalp cyst. She is accompanied by her daughter today.   Patient is in today for right axilla hidradenitis suppurativa flare. She states that she has a history of HS which began in 2020. At that time, her condition was so severe that it required two I&Ds and multiple rounds of antibiotics. She noticed about a month ago a return of pimple-like irritation, and so she wanted to get it looked at. She also has concerns regarding a bump on the back of her scalp which has been present for about a month and is painful to the touch. She has had similar bumps previously which were drained by other providers.   ROS: Denies headaches, dizziness, fever, chills, runny nose, sore throat, vision changes, hearing changes, chest pain, shortness of breath, difficulty breathing, nausea, vomiting, abdominal pain. Denies increased urinary frequency, pain with urination, constipation or diarrhea. No recent falls.        Objective:    BP (!) 135/92 (BP Location: Left Arm, Patient Position: Sitting, Cuff Size: Large)   Pulse 93   Temp 98.1 F (36.7 C) (Oral)   Ht 5' 7 (1.702 m)   Wt (!) 352 lb (159.7 kg)   SpO2 92%   BMI 55.13 kg/m  BP Readings from Last 3 Encounters:  10/11/23 (!) 135/92  08/21/23 132/76  05/22/23 (!) 151/70   Wt Readings from Last 3 Encounters:  10/11/23 (!) 352 lb (159.7 kg)  08/21/23 (!) 357 lb 11.2 oz (162.3 kg)  05/22/23 (!) 344 lb 4.8 oz (156.2 kg)      Constitutional: pleasant female sitting in exam chair, in no acute distress. She has bilateral below  the knee amputations with prosthetics present and utilizes wheelchair to help get around.  HEENT: normocephalic atraumatic, mucous membranes moist Cardiovascular: regular rate and rhythm Pulmonary/Chest: normal work of breathing on room air MSK: normal bulk and tone. Neurological: alert & oriented x 3 Skin: warm and dry, right axilla with multiple scattered papules and pustules, no deep cysts or tracks felt in axilla, minor fissures present; minimally fluctuant cyst about 1.5 cm in diameter located in parietal area of scalp with erythema and no active drainage.  Psych: mood calm, behavior normal, thought content normal, judgement normal    No results found for any visits on 10/11/23.      Assessment & Plan:   Problem List Items Addressed This Visit       Musculoskeletal and Integument   Hidradenitis suppurativa of right axilla - Primary   Patient presents with scattered papules and pustules of her right axilla, consistent in presentation with hidradenitis suppurativa. She has a complex history of HS of her right axilla which previously required surgical I&D and multiple rounds of antibiotics in 2020. At that time, she had deep tracks as well. Today on physical exam, no tracks or deep cysts appreciated. Picture uploaded to media tab of patient's chart. Given her complex history of HS, we will begin her on oral Doxycyline. We discussed keeping the area clean and dry. We  also discussed that contributing factors such as genetics, weight gain, and diabetes all contribute to the condition. She is working toward optimizing her diabetes and is due in a month for recheck. We will also refer her to Dermatology for her HS.  - Doxycycline  100 mg BID for 7 days  - Dermatology referral       Relevant Orders   Ambulatory referral to Dermatology   Scalp cyst   Minimally fluctuant cyst located in the parietal region of patient's scalp. She states it has been present for about a month's time. It is about  1.5 cm in diameter, erythema present, without drainage. No signs of systemic infection, patient denies fever and chills. I am starting her on Doxycyline for her H.S., and if this does not clear up, patient is aware that she can let us  know and it can be manually drained/extracted in the office. We will refer her to dermatology for recurrence of these cysts.  - Dermatology referral       Relevant Orders   Ambulatory referral to Dermatology    Meds ordered this encounter  Medications   doxycycline  (VIBRAMYCIN ) 100 MG capsule    Sig: Take 1 capsule (100 mg total) by mouth 2 (two) times daily with a meal for 7 days.    Dispense:  14 capsule    Refill:  0    Return for Regularly scheduled diabetes recheck.    Patient discussed with Dr. Jeanelle, who also saw and evaluated the patient.  Lori Miyamoto, MD [facility] Internal Medicine  PGY-1  10/11/23, 4:30 PM

## 2023-10-11 NOTE — Assessment & Plan Note (Signed)
 Minimally fluctuant cyst located in the parietal region of patient's scalp. She states it has been present for about a month's time. It is about 1.5 cm in diameter, erythema present, without drainage. No signs of systemic infection, patient denies fever and chills. I am starting her on Doxycyline for her H.S., and if this does not clear up, patient is aware that she can let us  know and it can be manually drained/extracted in the office. We will refer her to dermatology for recurrence of these cysts.  - Dermatology referral

## 2023-10-12 NOTE — Progress Notes (Signed)
 Internal Medicine Clinic Attending  I was physically present during the key portions of the resident provided service and participated in the medical decision making of patient's management care. I reviewed pertinent patient test results.  The assessment, diagnosis, and plan were formulated together and I agree with the documentation in the resident's note.  Carney Living, MD

## 2023-10-16 ENCOUNTER — Other Ambulatory Visit: Payer: Self-pay

## 2023-10-16 DIAGNOSIS — E1142 Type 2 diabetes mellitus with diabetic polyneuropathy: Secondary | ICD-10-CM

## 2023-10-16 MED ORDER — PREGABALIN 100 MG PO CAPS: 100.0000 mg | ORAL_CAPSULE | Freq: Three times a day (TID) | ORAL | 2 refills | Status: DC

## 2023-10-31 ENCOUNTER — Telehealth: Payer: Self-pay

## 2023-10-31 NOTE — Telephone Encounter (Signed)
 Prior Authorization for patient (Dexcom G7 Sensor) came through on cover my meds was submitted with last office notes and labs awaiting approval or denial.  XZB:[id number]

## 2023-10-31 NOTE — Telephone Encounter (Signed)
 Regarding member: [id number]; Lori Schmidt; Oct 12, 1975 Dear Dr. Ozell Nearing: [employer] reviewed your [facility] G7 SENSOR request for the above-identified member,  and it is denied for the following reason: because we did not see what we need to approve the  device you asked for, (Dexcom G7 sensor). We may be able to approve this device when we  see certain records (records that you have been able to maintain or further improve blood  sugar [glycemic] control or records that you continue to use a device called an external insulin   pump). We based this decision on your health plan's prior authorization clinical criteria named  Therapeutic Continuous Glucose Monitoring Systems (CGM) and Related Supplies.

## 2023-11-06 ENCOUNTER — Ambulatory Visit: Admitting: Student

## 2023-11-06 VITALS — BP 135/81 | HR 91 | Temp 98.5°F | Ht 67.0 in | Wt 356.2 lb

## 2023-11-06 DIAGNOSIS — Z794 Long term (current) use of insulin: Secondary | ICD-10-CM | POA: Diagnosis not present

## 2023-11-06 DIAGNOSIS — L732 Hidradenitis suppurativa: Secondary | ICD-10-CM | POA: Diagnosis not present

## 2023-11-06 DIAGNOSIS — E785 Hyperlipidemia, unspecified: Secondary | ICD-10-CM

## 2023-11-06 DIAGNOSIS — Z6841 Body Mass Index (BMI) 40.0 and over, adult: Secondary | ICD-10-CM

## 2023-11-06 DIAGNOSIS — Z89511 Acquired absence of right leg below knee: Secondary | ICD-10-CM

## 2023-11-06 DIAGNOSIS — Z1159 Encounter for screening for other viral diseases: Secondary | ICD-10-CM

## 2023-11-06 DIAGNOSIS — Z139 Encounter for screening, unspecified: Secondary | ICD-10-CM

## 2023-11-06 DIAGNOSIS — E119 Type 2 diabetes mellitus without complications: Secondary | ICD-10-CM

## 2023-11-06 DIAGNOSIS — Z1329 Encounter for screening for other suspected endocrine disorder: Secondary | ICD-10-CM

## 2023-11-06 DIAGNOSIS — Z89512 Acquired absence of left leg below knee: Secondary | ICD-10-CM | POA: Diagnosis not present

## 2023-11-06 DIAGNOSIS — N939 Abnormal uterine and vaginal bleeding, unspecified: Secondary | ICD-10-CM

## 2023-11-06 MED ORDER — PRAVASTATIN SODIUM 40 MG PO TABS: 40.0000 mg | ORAL_TABLET | Freq: Every day | ORAL | 11 refills | Status: DC

## 2023-11-06 MED ORDER — DOXYCYCLINE HYCLATE 100 MG PO CAPS: 100.0000 mg | ORAL_CAPSULE | Freq: Two times a day (BID) | ORAL | 0 refills | Status: DC

## 2023-11-06 MED ORDER — CLINDAMYCIN PHOS (ONCE-DAILY) 1 % EX GEL: 1.0000 "application " | Freq: Every day | CUTANEOUS | 2 refills | Status: DC

## 2023-11-06 NOTE — Progress Notes (Signed)
 Internal Medicine Clinic Attending  Case discussed with the resident at the time of the visit.  We reviewed the resident's history and exam and pertinent patient test results.  I agree with the assessment, diagnosis, and plan of care documented in the resident's note.

## 2023-11-06 NOTE — Assessment & Plan Note (Signed)
 With ongoing weight gain of ~100 lbs in the last year or so, probably attributable to sedentary living after having bilateral BKAs and being out of work. She sees dietitian Corporate treasurer for comorbid diabetes. She isn't taking GLP-1 agonist because of side-effects when she tried it last. Will need close follow-up for this.

## 2023-11-06 NOTE — Patient Instructions (Signed)
 Remember to bring all of the medications that you take (including over the counter medications and supplements) with you to every clinic visit.  This after visit summary is an important review of tests, referrals, and medication changes that were discussed during your visit. If you have questions or concerns, call [phone]. Outside of clinic business hours, call the main hospital at [phone] and ask the operator for the on-call internal medicine resident.   Ozell Kung MD 11/06/2023, 9:00 AM

## 2023-11-06 NOTE — Progress Notes (Signed)
 Patient name: Lori Schmidt Date of birth: 11-Aug-1975 Date of visit: 11/06/23  Subjective   Chief concern: lump behind right ear.  She's still struggling with the pustules and bumps under her arms. The spot on her scalp is improving. She has a spot behind her right ear that is painful.  She needs new stump liners for her bilateral BKA.  She has been spotting 2 months. Not even saturating a pad daily. She has been on the Depo Provera  shot for years without bleeding. She takes this for uterine cramping, which she has not had in a while. Overdue for Pap test.  Review of Systems  Constitutional:  Negative for malaise/fatigue.       Gained ~100 lbs over last year  Genitourinary:        Light vaginal bleeding  Psychiatric/Behavioral:  Positive for depression.     Current Outpatient Medications  Medication Instructions   Accu-Chek Softclix Lancets lancets Use to check blood sugar up to 3 times daily as needed   Blood Glucose Monitoring Suppl (ACCU-CHEK GUIDE) w/Device KIT Use to check blood sugar up to 3 times daily as needed   citalopram  (CELEXA ) 40 MG tablet TAKE 1 TABLET BY MOUTH EVERYDAY AT BEDTIME   Continuous Glucose Sensor (DEXCOM G7 SENSOR) MISC See admin instructions   glucose blood (ACCU-CHEK GUIDE) test strip Check blood sugar up to 3 times daily as needed   insulin  glargine (LANTUS  SOLOSTAR) 100 UNIT/ML Solostar Pen INJECT 100 UNITS INTO THE SKIN 2 (TWO) TIMES DAILY.   Insulin  Pen Needle 32G X 4 MM MISC Use twice daily to inject insulin    pravastatin  (PRAVACHOL ) 40 mg, Oral, Daily   pregabalin  (LYRICA ) 100 mg, Oral, 3 times daily   sitaGLIPtin  (JANUVIA ) 25 mg, Oral, Daily     Objective  Today's Vitals   11/06/23 0830  BP: 135/81  Pulse: 91  Temp: 98.5 F (36.9 C)  TempSrc: Oral  SpO2: 100%  Weight: (!) 356 lb 3.2 oz (161.6 kg)  Height: 5' 7 (1.702 m)  PainSc: 8   Body mass index is 55.79 kg/m.   Physical Exam Constitutional:      General: She is not  in acute distress.    Appearance: Normal appearance.  Neck:     Thyroid: No thyroid mass, thyromegaly or thyroid tenderness.     Vascular: No carotid bruit.  Cardiovascular:     Rate and Rhythm: Normal rate and regular rhythm.     Pulses: Normal pulses.     Heart sounds: No murmur heard. Pulmonary:     Effort: Pulmonary effort is normal.     Breath sounds: Normal breath sounds. No wheezing or rales.  Musculoskeletal:     Comments: Bilateral bka  Lymphadenopathy:     Cervical: No cervical adenopathy.  Skin:    General: Skin is warm and dry.     Comments: Tender erythematous nodule at angle of right mandible, somewhat indurated. Pustules right axilla.  Neurological:     Mental Status: She is alert. Mental status is at baseline.     Cranial Nerves: No facial asymmetry.     Motor: No tremor.  Psychiatric:        Behavior: Behavior normal.      Assessment & Plan  Problem List Items Addressed This Visit     S/P BKA (below knee amputation), left (HCC) (Chronic)   Stump sleeves for both legs requested as hers no longer fit appropriately.      Relevant Orders  AMB REFERRAL FOR DME   S/P BKA (below knee amputation), right (HCC) (Chronic)   Relevant Orders   AMB REFERRAL FOR DME   Morbid obesity with BMI of 50.0-59.9, adult (HCC)   With ongoing weight gain of ~100 lbs in the last year or so, probably attributable to sedentary living after having bilateral BKAs and being out of work. She sees dietitian Corporate treasurer for comorbid diabetes. She isn't taking GLP-1 agonist because of side-effects when she tried it last. Will need close follow-up for this.      Relevant Orders   Comprehensive metabolic panel with GFR   TSH   Uncontrolled type 2 diabetes mellitus on insulin  (HCC) - Primary   On glargine 100 u twice daily and sitagliptin  25 mg daily. Hasn't been wearing CGM or checking blood glucose by fingerstick. Metformin  and GLP-1 agonists are intolerable due to GI side-effects.  SGLT-2 inhibitor intolerable because of history of yeast infections. A1c up-trending. No changes today, close follow-up.      Relevant Medications   pravastatin  (PRAVACHOL ) 40 MG tablet   Other Relevant Orders   Comprehensive metabolic panel with GFR   Hemoglobin A1c   Encounter for screening involving social determinants of health (SDoH)   Declines colonoscopy due to high out-of-pocket cost. She is disabled due to bilateral BKA, wonder if she is Medicare eligible.      Relevant Orders   AMB Referral [facility] Care Management   Hidradenitis suppurativa of right axilla   Ongoing despite a course of doxycycline . The lesions under her arms don't look worse today compared to the photo from her last visit. Her scalp lesion is healing. She has a new nodule under her right ear around the angle of her mandible that is swollen, red and tender. No pustule there, don't think this is amenable to drainage. Repeat course of doxycycline , start clindamycin  ointment. I recommend she call the dermatologist to check for cancellations as her appointment is not set until February 2026.      Relevant Medications   doxycycline  (VIBRAMYCIN ) 100 MG capsule   Clindamycin  Phos, Once-Daily, (CLINDAGEL) 1 % GEL   Abnormal uterine bleeding   First time she has bled through Depo Provera , which helps her uterine cramping (she's not using this for pregnancy prophylaxis). Daily, light spotting that doesn't saturate a pad. She's overdue for Pap, which I recommended she come back for in a couple of weeks. Check CBC and serum chemistry today.      Relevant Orders   CBC no Diff   Dyslipidemia   Relevant Medications   pravastatin  (PRAVACHOL ) 40 MG tablet   Other Visit Diagnoses       Screening for thyroid disorder       Relevant Orders   TSH     Need for hepatitis C screening test       Relevant Orders   Hepatitis C Ab reflex to Quant PCR      Return in about 4 weeks (around 12/04/2023), or Pap test.  Ozell Kung MD 11/06/2023, 12:32 PM

## 2023-11-06 NOTE — Assessment & Plan Note (Signed)
 On glargine 100 u twice daily and sitagliptin  25 mg daily. Hasn't been wearing CGM or checking blood glucose by fingerstick. Metformin  and GLP-1 agonists are intolerable due to GI side-effects. SGLT-2 inhibitor intolerable because of history of yeast infections. A1c up-trending. No changes today, close follow-up.

## 2023-11-06 NOTE — Assessment & Plan Note (Signed)
 First time she has bled through Depo Provera , which helps her uterine cramping (she's not using this for pregnancy prophylaxis). Daily, light spotting that doesn't saturate a pad. She's overdue for Pap, which I recommended she come back for in a couple of weeks. Check CBC and serum chemistry today.

## 2023-11-06 NOTE — Assessment & Plan Note (Signed)
 Stump sleeves for both legs requested as hers no longer fit appropriately.

## 2023-11-06 NOTE — Assessment & Plan Note (Signed)
 Ongoing despite a course of doxycycline . The lesions under her arms don't look worse today compared to the photo from her last visit. Her scalp lesion is healing. She has a new nodule under her right ear around the angle of her mandible that is swollen, red and tender. No pustule there, don't think this is amenable to drainage. Repeat course of doxycycline , start clindamycin  ointment. I recommend she call the dermatologist to check for cancellations as her appointment is not set until February 2026.

## 2023-11-06 NOTE — Assessment & Plan Note (Signed)
 Declines colonoscopy due to high out-of-pocket cost. She is disabled due to bilateral BKA, wonder if she is Medicare eligible.

## 2023-11-07 ENCOUNTER — Ambulatory Visit: Payer: Self-pay | Admitting: Student

## 2023-11-07 LAB — TSH: TSH: 1.05 u[IU]/mL (ref 0.450–4.500)

## 2023-11-07 LAB — COMPREHENSIVE METABOLIC PANEL WITH GFR
ALT: 31 IU/L (ref 0–32)
AST: 29 IU/L (ref 0–40)
Albumin: 3.9 g/dL (ref 3.9–4.9)
Alkaline Phosphatase: 111 IU/L (ref 44–121)
BUN/Creatinine Ratio: 31 — ABNORMAL HIGH (ref 9–23)
BUN: 16 mg/dL (ref 6–24)
Bilirubin Total: 0.3 mg/dL (ref 0.0–1.2)
CO2: 19 mmol/L — ABNORMAL LOW (ref 20–29)
Calcium: 8.9 mg/dL (ref 8.7–10.2)
Chloride: 101 mmol/L (ref 96–106)
Creatinine, Ser: 0.52 mg/dL — ABNORMAL LOW (ref 0.57–1.00)
Globulin, Total: 2.8 g/dL (ref 1.5–4.5)
Glucose: 161 mg/dL — ABNORMAL HIGH (ref 70–99)
Potassium: 4.3 mmol/L (ref 3.5–5.2)
Sodium: 139 mmol/L (ref 134–144)
Total Protein: 6.7 g/dL (ref 6.0–8.5)
eGFR: 115 mL/min/1.73 (ref >59)

## 2023-11-07 LAB — CBC
Hematocrit: 40.4 % (ref 34.0–46.6)
Hemoglobin: 13 g/dL (ref 11.1–15.9)
MCH: 27.9 pg (ref 26.6–33.0)
MCHC: 32.2 g/dL (ref 31.5–35.7)
MCV: 87 fL (ref 79–97)
Platelets: 266 x10E3/uL (ref 150–450)
RBC: 4.66 x10E6/uL (ref 3.77–5.28)
RDW: 13.1 % (ref 11.7–15.4)
WBC: 9.4 x10E3/uL (ref 3.4–10.8)

## 2023-11-07 LAB — HEMOGLOBIN A1C
Est. average glucose Bld gHb Est-mCnc: 237 mg/dL
Hgb A1c MFr Bld: 9.9 % — ABNORMAL HIGH (ref 4.8–5.6)

## 2023-11-07 LAB — HCV INTERPRETATION

## 2023-11-07 LAB — HCV AB W REFLEX TO QUANT PCR: HCV Ab: NONREACTIVE

## 2023-11-28 ENCOUNTER — Telehealth: Payer: Self-pay | Admitting: *Deleted

## 2023-11-28 NOTE — Progress Notes (Unsigned)
 Complex Care Management Note Care Guide Note  11/28/2023 Name: Sherie Dobrowolski MRN: [id number] DOB: [birth year]   Complex Care Management Outreach Attempts: An unsuccessful telephone outreach was attempted today to offer the patient information about available complex care management services.  Follow Up Plan:  Additional outreach attempts will be made to offer the patient complex care management information and services.   Encounter Outcome:  No Answer  Harlene Satterfield   [facility]  [facility], [facility] Guide  Direct Dial : [phone]  Fax [phone]

## 2023-11-29 NOTE — Progress Notes (Unsigned)
 Complex Care Management Note Care Guide Note  11/29/2023 Name: Lori Schmidt MRN: [id number] DOB: 1975-11-05   Complex Care Management Outreach Attempts: A second unsuccessful outreach was attempted today to offer the patient with information about available complex care management services.  Follow Up Plan:  Additional outreach attempts will be made to offer the patient complex care management information and services.   Encounter Outcome:  No Answer  Harlene Satterfield  [facility]  [facility], [facility] Guide  Direct Dial : [phone]  Fax [phone]

## 2023-11-30 NOTE — Progress Notes (Signed)
 Complex Care Management Note  Care Guide Note 11/30/2023 Name: Lori Schmidt MRN: [id number] DOB: October 21, 1975  Lori Schmidt is a 48 y.o. year old female who sees Elicia Sharper, DO for primary care. I reached out to Geofm Earnie Andrew by phone today to offer complex care management services.  [name] was given information about Complex Care Management services today including:   The Complex Care Management services include support from the care team which includes your Nurse Care Manager, Clinical Social Worker, or Pharmacist.  The Complex Care Management team is here to help remove barriers to the health concerns and goals most important to you. Complex Care Management services are voluntary, and the patient may decline or stop services at any time by request to their care team member.   Complex Care Management Consent Status: Patient agreed to services and verbal consent obtained.   Follow up plan:  Telephone appointment with complex care management team member scheduled for:  12/19/23  Encounter Outcome:  Patient Scheduled  Harlene Satterfield  [facility]  [facility], [facility] Guide  Direct Dial : [phone]  Fax [phone]

## 2023-12-04 ENCOUNTER — Encounter: Admitting: Student

## 2023-12-07 ENCOUNTER — Other Ambulatory Visit (HOSPITAL_COMMUNITY)
Admission: RE | Admit: 2023-12-07 | Discharge: 2023-12-07 | Disposition: A | Discharge status: Home / Self Care | Source: Ambulatory Visit | Attending: Internal Medicine | Admitting: Internal Medicine

## 2023-12-07 ENCOUNTER — Ambulatory Visit: Admitting: Student

## 2023-12-07 VITALS — BP 135/86 | HR 106 | Temp 98.0°F | Ht 67.0 in | Wt 355.8 lb

## 2023-12-07 DIAGNOSIS — Z124 Encounter for screening for malignant neoplasm of cervix: Secondary | ICD-10-CM | POA: Diagnosis present

## 2023-12-07 DIAGNOSIS — N939 Abnormal uterine and vaginal bleeding, unspecified: Secondary | ICD-10-CM | POA: Insufficient documentation

## 2023-12-07 DIAGNOSIS — Z7409 Other reduced mobility: Secondary | ICD-10-CM | POA: Diagnosis not present

## 2023-12-07 DIAGNOSIS — Z89512 Acquired absence of left leg below knee: Secondary | ICD-10-CM | POA: Diagnosis not present

## 2023-12-07 DIAGNOSIS — Z89511 Acquired absence of right leg below knee: Secondary | ICD-10-CM | POA: Diagnosis not present

## 2023-12-07 NOTE — Patient Instructions (Addendum)
 Thank you, Ms.Geofm Earnie Andrew for allowing us  to provide your care today. Today we discussed   Bleeding: - We have collected your pap smear. I will call you with the results  Referrals: - OBGYN for potentially an endometrial biopsy - [facility] for your prosthesis   My Chart Access: [email or web address]  Please follow-up in: 3 months or sooner if your symptoms do not improve    We look forward to seeing you next time. Please call our clinic at [phone] if you have any questions or concerns. The best time to call is Monday-Friday from 9am-4pm, but there is someone available 24/7. If after hours or the weekend, call the main hospital number and ask for the Internal Medicine Resident On-Call. If you need medication refills, please notify your pharmacy one week in advance and they will send us  a request.   Thank you for letting us  take part in your care. Wishing you the best!  Elnora Ip, MD 12/07/2023, 2:41 PM [facility]

## 2023-12-08 DIAGNOSIS — Z124 Encounter for screening for malignant neoplasm of cervix: Secondary | ICD-10-CM | POA: Insufficient documentation

## 2023-12-08 LAB — CYTOLOGY - PAP
Comment: NEGATIVE
Diagnosis: NEGATIVE
High risk HPV: NEGATIVE

## 2023-12-08 LAB — CERVICOVAGINAL ANCILLARY ONLY
Bacterial Vaginitis (gardnerella): NEGATIVE
Candida Glabrata: NEGATIVE
Candida Vaginitis: NEGATIVE
Chlamydia: NEGATIVE
Comment: NEGATIVE
Comment: NEGATIVE
Comment: NEGATIVE
Comment: NEGATIVE
Comment: NEGATIVE
Comment: NORMAL
Neisseria Gonorrhea: NEGATIVE
Trichomonas: NEGATIVE

## 2023-12-08 NOTE — Assessment & Plan Note (Signed)
 DME for propsthesis renewal sent

## 2023-12-08 NOTE — Assessment & Plan Note (Signed)
 DME for propsthesis materials renewal sent

## 2023-12-08 NOTE — Assessment & Plan Note (Addendum)
 Assessed today for same. Had 2 months of spotting, stopped as of the last week. Has not been on Depo Provera  recently.   Vaginal canal and cervix examination today. Cervix with lots of mucoid material, easy bleeding with light blushing, and thick mucoid secretion vs endocervical polyp visualized today.  - Pap smear with HPV testing sent - Referral to OBGYN, urgent, in this premenopausal woman for endometrial biopsy and repeat cervical visualization to r/o polyp.  Addendum: normal pap and HPV. No infection detected. Will continue with referral to OBGYN.

## 2023-12-08 NOTE — Assessment & Plan Note (Addendum)
 Pap smear today. Increased white and green-ish secretions in the posterior vaginal wall. Friable cervix with thick mucoid secretion I could not scrape at the 12 o/clock position.  - HPV + cytology sent, n abnormality noted - Trich, G/C sent, no infection detected

## 2023-12-08 NOTE — Progress Notes (Addendum)
 Subjective:  CC: vaginal bleeding follow up  HPI:  [name] is a 48 y.o. female with a past medical history stated below and presents today for vaginal bleeding follow up. Please see problem based assessment and plan for additional details.  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   Type 2 diabetes mellitus (HCC)     Current Outpatient Medications on File Prior to Visit  Medication Sig Dispense Refill   Accu-Chek Softclix Lancets lancets Use to check blood sugar up to 3 times daily as needed 100 each 12   Blood Glucose Monitoring Suppl (ACCU-CHEK GUIDE) w/Device KIT Use to check blood sugar up to 3 times daily as needed 1 kit 1   Clindamycin  Phos, Once-Daily, (CLINDAGEL) 1 % GEL Apply 1 application  topically daily. Apply in a thin film to affected areas daily 75 mL 2   Continuous Glucose Sensor (DEXCOM G7 SENSOR) MISC USE AS DIRECTED 3 each 11   doxycycline  (VIBRAMYCIN ) 100 MG capsule Take 1 capsule (100 mg total) by mouth 2 (two) times daily. 60 capsule 0   glucose blood (ACCU-CHEK GUIDE) test strip Check blood sugar up to 3 times daily as needed (Patient not taking: Reported on 12/19/2023) 100 each 12   insulin  glargine (LANTUS  SOLOSTAR) 100 UNIT/ML Solostar Pen INJECT 100 UNITS INTO THE SKIN 2 (TWO) TIMES DAILY. 60 mL 11   Insulin  Pen Needle 32G X 4 MM MISC Use twice daily to inject insulin  120 each 11   pravastatin  (PRAVACHOL ) 40 MG tablet Take 1 tablet (40 mg total) by mouth daily. 30 tablet 11   pregabalin  (LYRICA ) 100 MG capsule Take 1 capsule (100 mg total) by mouth 3 (three) times daily. 90 capsule 2   sitaGLIPtin  (JANUVIA ) 25 MG tablet Take 1 tablet (25 mg total) by mouth daily. 90 tablet 1   No current facility-administered medications on file prior to visit.    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 Mother is Lori Schmidt   Social Drivers of Health   Financial Resource Strain: Medium Risk (10/11/2023)   Overall Financial Resource Strain (CARDIA)    Difficulty of Paying Living Expenses: Somewhat hard  Food Insecurity: Food Insecurity Present (12/19/2023)   Hunger Vital Sign    Worried About  Running Out of Food in the Last Year: Sometimes true    Ran Out of Food in the Last Year: Sometimes true  Transportation Needs: No Transportation Needs (12/19/2023)   PRAPARE - Administrator, Civil Service (Medical): No    Lack of Transportation (Non-Medical): No  Physical Activity: Inactive (12/19/2023)   Exercise Vital Sign    Days of Exercise per Week: 0 days    Minutes of Exercise per Session: 0 min  Stress: Stress Concern Present (12/19/2023)   [employer] of Occupational Health - Occupational Stress Questionnaire    Feeling of Stress: Rather much  Social Connections: Moderately Isolated (10/11/2023)   Social Connection and Isolation Panel    Frequency of Communication with Friends and Family: More than three  times a week    Frequency of Social Gatherings with Friends and Family: Once a week    Attends Religious Services: More than 4 times per year    Active Member of [employer] or Organizations: No    Attends Banker Meetings: Never    Marital Status: Divorced  Catering manager Violence: Not At Risk (10/11/2023)   Humiliation, Afraid, Rape, and Kick questionnaire    Fear of Current or Ex-Partner: No    Emotionally Abused: No    Physically Abused: No    Sexually Abused: No    Review of Systems: ROS negative except for what is noted on the assessment and plan.  Objective:   Vitals:   12/07/23 1348  BP: 135/86  Pulse: (!) 106  Temp: 98 F (36.7 C)  TempSrc: Oral  SpO2: 98%  Weight: (!) 355 lb 12.8 oz (161.4 kg)  Height: 5' 7 (1.702 m)    Physical Exam: Constitutional: well-appearing woman laying in bed, in no acute distress HENT: normocephalic atraumatic, mucous membranes moist Eyes: conjunctiva non-erythematous Neck: supple Cardiovascular: regular rate and rhythm, no m/r/g Pulmonary/Chest: normal work of breathing on room air, lungs clear to auscultation bilaterally Abdominal: soft, non-tender, non-distended MSK: Residual bilateral likbs s/p BKA without open skin. Mildly tender Neurological: alert & oriented x 3 Genital exam with chaperone: [name] Inguinal folds, vulva, and vaginal opening without secretions, rashes, or lesions. Ruggae present in the vagina. No bleeding. Increased white to greenish secretion in the posterior vaginal wall near the cervix. Friable cervix with easy bleeding. Thick mucoid secretion at 12 o'clock of cervix vs endocervical polyp. Psych: Pleasant mood and affect    Assessment & Plan:   Abnormal uterine bleeding Assessed today for same. Had 2 months of spotting, stopped as of the last week. Has not been on Depo Provera  recently.   Vaginal canal and cervix examination today. Cervix with lots of mucoid material, easy bleeding with  light blushing, and thick mucoid secretion vs endocervical polyp visualized today.  - Pap smear with HPV testing sent - Referral to OBGYN, urgent, in this premenopausal woman for endometrial biopsy and repeat cervical visualization to r/o polyp.  Addendum: normal pap and HPV. No infection detected. Will continue with referral to OBGYN.   S/P BKA (below knee amputation), right (HCC) DME for propsthesis renewal sent  S/P BKA (below knee amputation), left (HCC) DME for propsthesis materials renewal sent  Encounter for screening for cervical cancer Pap smear today. Increased white and green-ish secretions in the posterior vaginal wall. Friable cervix with thick mucoid secretion I could not scrape at the 12 o/clock position.  - HPV + cytology sent, n abnormality noted - Trich, G/C sent, no infection detected  Mobility impaired Patient status post bilateral below the knee amputations, who needs bilateral leg in socket prosthesis and associated DME to successfully fit into prosthersis. These items need replacement as it is creating discomfort with ambulation. Referral to DME services sent.      Return in about 3 months (around 03/08/2024) for Chronic condition follow up and follow up on Abnormal uterine bleeding.  Patient discussed with Dr. Karna Hadassah Kristy Rosario, MD [facility] Internal Medicine Residency Program  12/19/2023, 2:03 PM

## 2023-12-09 ENCOUNTER — Other Ambulatory Visit: Payer: Self-pay | Admitting: Student

## 2023-12-09 DIAGNOSIS — F419 Anxiety disorder, unspecified: Secondary | ICD-10-CM

## 2023-12-11 ENCOUNTER — Ambulatory Visit: Payer: Self-pay | Admitting: Student

## 2023-12-12 ENCOUNTER — Telehealth: Payer: Self-pay

## 2023-12-12 ENCOUNTER — Encounter: Payer: Self-pay | Admitting: Student

## 2023-12-12 NOTE — Telephone Encounter (Signed)
 Lori Schmidt (Key: [id number]) PA Case ID #: [id number] Rx #: [id number] Need Help? Call us  at [phone] Outcome Approved today by [facility] [place]  Medicaid [place] Case: [id number], Status: Approved, Coverage Starts on: 12/12/2023 12:00:00 AM, Coverage Ends on: 12/11/2024 12:00:00 AM. Effective Date: 12/12/2023 Authorization Expiration Date: 12/11/2024 Drug Dexcom G7 Sensor ePA cloud logo Form [employer] Healthy Blue   Medicaid Electronic PA Form [phone] NCPDP) Original Claim Info 50

## 2023-12-12 NOTE — Telephone Encounter (Signed)
 Prior Authorization for patient (Dexcom G7 Sensor) came through on cover my meds was submitted with last office notes and labs awaiting approval or denial.  XZB:[id number]

## 2023-12-12 NOTE — Progress Notes (Signed)
 Internal Medicine Clinic Attending  Case discussed with the resident at the time of the visit.  We reviewed the resident's history and exam and pertinent patient test results.  I agree with the assessment, diagnosis, and plan of care documented in the resident's note.

## 2023-12-14 NOTE — Telephone Encounter (Signed)
 Called the [facility] and spoke with Melissa.  The DME order that was faxed to their office is correct.  What needs to be correct is the OV notes from  12/07/2023 with Dr. Elnora.  Note will need to reflect the reason why she needs the Prothesis and the note must mention the Ambulatory needs ([place]).  Per Eleanor she will fax over a guideline sheet to go by to correct the ov notes.  Called the patient an explained what needs to be done.  PT to call back by Monday to f/u. Copied from CRM [id number]. Topic: Referral - Status >> Dec 13, 2023  9:27 AM Susanna ORN wrote: Reason for CRM: Patient called in regards to referral correction for prosthetic legs. She sent a message via MyChart and wanted to know if someone saw it. Advised her that it was sent over to referral coordinator. She states the referral has the incorrect wording and states she messaged what it needs to say per [facility]. Please give her a call for further updates. CB #: [id number].

## 2023-12-19 ENCOUNTER — Other Ambulatory Visit: Payer: Self-pay | Admitting: Licensed Clinical Social Worker

## 2023-12-19 ENCOUNTER — Other Ambulatory Visit: Payer: Self-pay

## 2023-12-19 DIAGNOSIS — Z7409 Other reduced mobility: Secondary | ICD-10-CM | POA: Insufficient documentation

## 2023-12-19 NOTE — Patient Instructions (Signed)
 Visit Information  Thank you for taking time to visit with me today. Please don't hesitate to contact me if I can be of assistance to you before our next scheduled appointment.  Our next appointment is by telephone on 02/06/24 at 3:00 PM  Please call the care guide team at [phone] if you need to cancel or reschedule your appointment.   Following is a copy of your care plan:   Goals Addressed             This Visit's Progress    VBCI Social Work Care Plan       Problems:   Transport issues occasionally              Walking Gaffer              Bilateral amputee (uses prosthetic devices: bilateral)             Depression  CSW Clinical Goal(s):   Over the next 30 days the Patient will attend all scheduled medical appointments as evidenced by patient report and care team review of appointment completion in electronic medical record .              Over next 30 days the client will research community resources of support to determine where she may get needed help with current needs AEB patient report of contacting agencies in community  Interventions:  Spoke with client about her needs and status               Spoke with client about Amputee support group at [facility]. She is planning on going to Amputee Support Group this week at [facility].              Spoke with [facility] about [facility]. 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 Lives with her daughter. Her daughter works part time job            Discussed medication procurement            Discussed program support with RN, LCSW, and Pharmacist            Discussed [place] Healthy Blue coverage.  Client to call insurer to discuss benefits available through her insurance             Discussed [employer] food [employer] .  Discussed [employer] (food  support)             Discussed past work history of client               Provided counseling support for client             Discussed transport needs. Client drives herself as needed. She takes her daughter to and from her daughter's job.             Encouraged client to call LCSW as needed for SW support at [phone]               Patient Goals/Self-Care Activities:  Attend scheduled medical appointments             Follow current care plan  Take medications as prescribed              Attend amputee support group as able.  Communicate as needed with [facility] to help her with prosthetic needs              Call LCSW as needed for SW support  Plan:   Telephone follow up appointment with care management team member scheduled for:  02/06/2024 at 3:00 PM         Please go to [facility] [address], [place] [phone]) if you are experiencing a Mental Health or Behavioral Health Crisis or need someone to talk to.  The patient verbalized understanding of instructions, educational materials, and care plan provided today and DECLINED offer to receive copy of patient instructions, educational materials, and care plan.    Glendia Pear  MSW, LCSW [place] Licensed Clinical Social Worker Direct Dial :  [phone] Fax:  [phone] Website:  [email or web address]

## 2023-12-19 NOTE — Assessment & Plan Note (Signed)
 Patient status post bilateral below the knee amputations, who needs bilateral leg in socket prosthesis and associated DME to successfully fit into prosthersis. These items need replacement as it is creating discomfort with ambulation. Referral to DME services sent.

## 2023-12-19 NOTE — Patient Outreach (Signed)
 Complex Care Management   Visit Note  12/19/2023  Name:  Lori Schmidt MRN: [id number] DOB: 12/05/1975  Situation: Referral received for Complex Care Management related to SDOH needs; stress issues; depression issues I obtained verbal consent from Patient.  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   Type 2 diabetes mellitus (HCC)     Assessment: Patient Reported Symptoms:  Cognitive Cognitive Status: Alert and oriented to person, place, and time Cognitive/Intellectual Conditions Management [RPT]: None reported or documented in medical history or problem list   Health Maintenance Behaviors: Annual physical exam, Stress management Health Facilitated by: Stress management  Neurological Neurological Review of Symptoms: Weakness Neurological Management Strategies: Adequate rest, Coping strategies  HEENT HEENT Symptoms Reported: No symptoms reported HEENT Management Strategies: Coping strategies    Cardiovascular Cardiovascular Symptoms Reported: Fatigue, Dizziness, Lightheadness Cardiovascular Management Strategies: Adequate rest, Coping strategies  Respiratory Respiratory Symptoms Reported: No symptoms reported Respiratory Management Strategies: Adequate rest, Coping strategies  Endocrine Endocrine Symptoms Reported: Weakness or fatigue Is patient diabetic?: Yes    Gastrointestinal Gastrointestinal Symptoms Reported: No symptoms reported Gastrointestinal Management Strategies: Adequate rest, Coping strategies    Genitourinary Genitourinary Symptoms Reported: No symptoms reported Genitourinary Management Strategies: Adequate rest, Coping strategies  Integumentary Integumentary Symptoms Reported: No symptoms reported Skin Management Strategies: Adequate rest, Coping strategies   Musculoskeletal Musculoskelatal Symptoms Reviewed: Limited mobility, Weakness Additional Musculoskeletal Details: uses bilateral  prostheses Musculoskeletal Management Strategies: Activity, Adequate rest, Coping strategies      Psychosocial Psychosocial Symptoms Reported: Anxiety - if selected complete GAD, Sadness - if selected complete PHQ 2-9, Depression - if selected complete PHQ 2-9 Behavioral Management Strategies: Coping strategies, Adequate rest, Counseling, Support group Major Change/Loss/Stressor/Fears (CP): Medical condition, self Techniques to Cope with Loss/Stress/Change: Counseling, Diversional activities, Support group Quality of Family Relationships: supportive Do you feel physically threatened by others?: No  Client uses wheelchair (manual). She uses prosthesis (bilateral) to help her walk short distances. Takes rest breaks    12/19/2023    PHQ2-9 Depression Screening   Little interest or pleasure in doing things Several days  Feeling down, depressed, or hopeless Several days  PHQ-2 - Total Score 2  Trouble falling or staying asleep, or sleeping too much Several days  Feeling tired or having little energy Several days  Poor appetite or overeating  Several days  Feeling bad about yourself - or that you are a failure or have let yourself or your family down Several days  Trouble concentrating on things, such as reading the newspaper or watching television Not at all  Moving or speaking so slowly that other people could have noticed.  Or the opposite - being so fidgety or restless that you have been moving around a lot more than usual Several days  Thoughts that you would be better off dead, or hurting yourself in some way Not at all  PHQ2-9 Total Score 7  If you checked off any problems, how difficult have these problems made it for you to do your work, take care of things at home, or get along with other people Somewhat difficult  Depression Interventions/Treatment  Counseling    Vitals:   BP in normal range per client information   Medications Reviewed Today     Reviewed by Frances Ozell GORMAN KEN (Social  Worker) on 12/19/23 at 1151  Med List Status: <None>   Medication Order Taking? Sig Documenting Provider Last Dose Status Informant  Accu-Chek Softclix Lancets lancets [id number] Yes Use to check blood sugar up to 3 times daily as needed Atway, Rayann N, DO  Active   Blood Glucose Monitoring Suppl (ACCU-CHEK GUIDE) w/Device KIT [id number] unknown Use to check blood sugar up to 3 times daily as needed Atway, Rayann N, DO  Active   citalopram  (CELEXA ) 40 MG tablet [id number] Yes TAKE 1 TABLET BY MOUTH EVERYDAY AT BEDTIME Elicia Sharper, DO  Active   Clindamycin  Phos, Once-Daily, (CLINDAGEL) 1 % GEL [id number] Yes Apply 1 application  topically daily. Apply in a thin film to affected areas daily McLendon, Kristyl Athens, MD  Active   Continuous Glucose Sensor (DEXCOM G7 SENSOR) MISC [id number] Yes USE AS DIRECTED Zheng, Evely Gainey, DO  Active   doxycycline  (VIBRAMYCIN ) 100 MG capsule [id number] Yes Take 1 capsule (100 mg total) by mouth 2 (two) times daily. Norrine Sharper, MD  Active   glucose blood (ACCU-CHEK GUIDE) test strip [id number] Not taking Check blood sugar up to 3 times daily as needed  Patient not taking: Reported on 12/19/2023   Atway, Rayann N, DO  Active   insulin  glargine (LANTUS  SOLOSTAR) 100 UNIT/ML Solostar Pen [id number] Yes INJECT 100 UNITS INTO THE SKIN 2 (TWO) TIMES DAILY. Elicia Sharper, DO  Active   Insulin  Pen Needle 32G X 4 MM MISC [id number] Yes Use twice daily to inject insulin  Zheng, Karma Ansley, DO  Active   pravastatin  (PRAVACHOL ) 40 MG tablet [id number] Yes Take 1 tablet (40 mg total) by mouth daily. Norrine Sharper, MD  Active   pregabalin  (LYRICA ) 100 MG capsule [id number] Yes Take 1 capsule (100 mg total) by mouth 3 (three) times daily. Zheng, Dominique Calvey, DO  Active   sitaGLIPtin  (JANUVIA ) 25 MG tablet [id number] Yes Take 1 tablet (25 mg  total) by mouth daily. Atway, Rayann N, DO  Active             Recommendation:   PCP Follow-up Continue Current Plan of Care Take medications as prescribed Attend Amputee Support Group as able  Communicate as needed with [facility] regarding prostheses needs of client Call LCSW as needed for SW support  Follow Up Plan:   Telephone follow up appointment date/time:  02/06/24 at 3:00 PM    Glendia Pear  MSW, LCSW [place] Licensed Clinical Social Worker Direct Dial :  [phone] Fax:  [phone] Website:  [email or web address]

## 2024-01-02 ENCOUNTER — Ambulatory Visit: Admitting: Advanced Practice Midwife

## 2024-01-02 ENCOUNTER — Encounter: Payer: Self-pay | Admitting: Family Medicine

## 2024-01-02 ENCOUNTER — Other Ambulatory Visit: Payer: Self-pay

## 2024-01-02 VITALS — BP 136/82 | HR 108 | Wt 359.4 lb

## 2024-01-02 DIAGNOSIS — R102 Pelvic and perineal pain: Secondary | ICD-10-CM

## 2024-01-02 DIAGNOSIS — Z1331 Encounter for screening for depression: Secondary | ICD-10-CM

## 2024-01-02 DIAGNOSIS — E119 Type 2 diabetes mellitus without complications: Secondary | ICD-10-CM | POA: Diagnosis not present

## 2024-01-02 DIAGNOSIS — N939 Abnormal uterine and vaginal bleeding, unspecified: Secondary | ICD-10-CM | POA: Diagnosis not present

## 2024-01-02 DIAGNOSIS — N911 Secondary amenorrhea: Secondary | ICD-10-CM

## 2024-01-02 DIAGNOSIS — Z6841 Body Mass Index (BMI) 40.0 and over, adult: Secondary | ICD-10-CM

## 2024-01-02 DIAGNOSIS — Z794 Long term (current) use of insulin: Secondary | ICD-10-CM

## 2024-01-02 NOTE — Progress Notes (Signed)
 GYNECOLOGY OFFICE VISIT NOTE  History:   Discussed the use of AI scribe software for clinical note transcription with the patient, who gave verbal consent to proceed.  History of Present Illness Lori Schmidt is a 48 year old female who presents with irregular bleeding and cramping after discontinuing Depo-Provera  May 2025.  She discontinued Depo-Provera  in May after twelve years of use. Following the last dose, she experienced daily spotting for two months, which ceased around July. Since then, she has not had any bleeding but experiences a cramping sensation that intensifies when sitting.  She suspects menopause due to hot flashes described as sudden intense heat lasting a few minutes. Her mother entered menopause in her late forties.  Her medical history includes diabetes and she is a double amputee. She is not sexually active and does not plan to be in the near future.   Last Pap    Component Value Date/Time   DIAGPAP  12/07/2023 1522    - Negative for intraepithelial lesion or malignancy (NILM)   HPVHIGH Negative 12/07/2023 1522   ADEQPAP  12/07/2023 1522    Satisfactory for evaluation; transformation zone component PRESENT.   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   Type 2 diabetes mellitus [facility])     Past Surgical History:  Procedure Laterality Date   AMPUTATION Left 02/19/2021   Procedure: AMPUTATION BELOW KNEE;  Surgeon: Harden Jerona GAILS, MD;  Location[facility];  Service: Orthopedics;  Laterality: Left;   AMPUTATION Right 03/23/2022   Procedure: RIGHT BELOW KNEE AMPUTATION;  Surgeon: Harden Jerona GAILS, MD;  Location: [facility] OR;  Service: Orthopedics;  Laterality: Right;   APPLICATION OF WOUND VAC Right 05/13/2022   Procedure: APPLICATION OF WOUND VAC;  Surgeon: Harden Jerona GAILS, MD;  Location: [facility] OR;  Service:  Orthopedics;  Laterality: Right;   BREAST CYST EXCISION Right 05/2018   four cysts removed on right breast/axilla area   STUMP REVISION Right 04/15/2022   Procedure: REVISION RIGHT BELOW KNEE AMPUTATION;  Surgeon: Harden Jerona GAILS, MD;  Location[facility] OR;  Service: Orthopedics;  Laterality: Right;   STUMP REVISION Right 05/13/2022   Procedure: RIGHT BELOW THE KNEE AMPUTATION REVISION;  Surgeon: Harden Jerona GAILS, MD;  Location: [facility] OR;  Service: Orthopedics;  Laterality: Right;   TEE WITHOUT CARDIOVERSION N/A 02/23/2021   Procedure: TRANSESOPHAGEAL ECHOCARDIOGRAM (TEE);  Surgeon: Mona Vinie BROCKS, MD;  Location: [facility] ENDOSCOPY;  Service: Cardiovascular;  Laterality: N/A;    The following portions of the patient's history were reviewed and updated as appropriate: allergies, current medications, past family history, past medical history, past social history, past surgical history and problem list.    Review of Systems:  Pertinent items noted in HPI and remainder of comprehensive ROS otherwise negative.  Physical Exam:  BP 136/82   Pulse (!) 108   Wt (!) 359 lb 6.4 oz (163 kg)   BMI 56.29 kg/m  Physical Exam GENERAL: Alert, cooperative, well developed, no acute distress HEENT: Normocephalic, normal oropharynx, moist mucous membranes CARDIOVASCULAR: Mild tachycardia RESPIRATORY: Effort and respiratory rate normal, no problems with respiration noted ABDOMEN: Deferred EXTREMITIES: Bilateral leg amputations NEUROLOGICAL: Cranial nerves grossly intact, Moves all extremities without gross motor or sensory deficit Pelvic: Deferred due to recent Pap and no current bleeding. Pt also has limited mobility due to amputations  and BMI 56.   Labs and Imaging No results found for this or any previous visit (from the past week). No results found.    Assessment and Plan:   Assessment & Plan Perimenopausal symptoms with abnormal uterine bleeding Irregular bleeding likely due to perimenopause, age, and family  history. Differential includes endometrial hyperplasia and PCOS, especially with diabetes. - Order pelvic ultrasound to assess endometrial thickness and evaluate ovaries. - Order blood work to check hormone levels for menopausal status. - Consider hormone replacement therapy and non-hormonal options if symptoms become severe. Discussed risks of hormone replacement therapy, including blood clots.  Obesity Obesity increases risk for endometrial hyperplasia and uterine cancer. - Documented BMI for insurance coverage of ultrasound.  Type 2 diabetes mellitus Discussed impact on uterine lining and ovarian function, increasing risk for endometrial hyperplasia and PCOS. - Continue management of type 2 diabetes mellitus.  1. Pelvic pain in female (Primary) - US  PELVIC COMPLETE WITH TRANSVAGINAL; Future  2. Secondary amenorrhea - US  PELVIC COMPLETE WITH TRANSVAGINAL; Future - FSH/LH  3. Abnormal uterine bleeding (AUB) - US  PELVIC COMPLETE WITH TRANSVAGINAL; Future - FSH/LH  4. Uncontrolled type 2 diabetes mellitus on insulin  (HCC) - US  PELVIC COMPLETE WITH TRANSVAGINAL; Future  5. BMI 50.0-59.9, adult (HCC) - US  PELVIC COMPLETE WITH TRANSVAGINAL; Future   Routine preventative health maintenance measures emphasized. Please refer to After Visit Summary for other counseling recommendations.   Return in about 2 weeks (around 01/16/2024) for F/U AUB, pelvic pain.    I spent 30 minutes dedicated to the care of this patient including pre-visit review of records, face to face time with the patient discussing her conditions and treatments, post visit ordering of medications and appropriate tests or procedures, coordinating care and documenting this visit encounter.    Alexyia Guarino  Claudene HOWARD 01/02/2024 7:04 PM Center for [employer], [facility]

## 2024-01-03 LAB — FSH/LH
FSH: 22.1 m[IU]/mL
LH: 17.4 m[IU]/mL

## 2024-01-07 ENCOUNTER — Ambulatory Visit: Payer: Self-pay | Admitting: Advanced Practice Midwife

## 2024-01-09 ENCOUNTER — Ambulatory Visit (HOSPITAL_BASED_OUTPATIENT_CLINIC_OR_DEPARTMENT_OTHER)
Admission: RE | Admit: 2024-01-09 | Discharge: 2024-01-09 | Disposition: A | Discharge status: Home / Self Care | Source: Ambulatory Visit | Attending: Advanced Practice Midwife | Admitting: Advanced Practice Midwife

## 2024-01-09 DIAGNOSIS — E119 Type 2 diabetes mellitus without complications: Secondary | ICD-10-CM | POA: Diagnosis present

## 2024-01-09 DIAGNOSIS — N939 Abnormal uterine and vaginal bleeding, unspecified: Secondary | ICD-10-CM | POA: Insufficient documentation

## 2024-01-09 DIAGNOSIS — Z6841 Body Mass Index (BMI) 40.0 and over, adult: Secondary | ICD-10-CM | POA: Diagnosis present

## 2024-01-09 DIAGNOSIS — R102 Pelvic and perineal pain unspecified side: Secondary | ICD-10-CM

## 2024-01-09 DIAGNOSIS — N911 Secondary amenorrhea: Secondary | ICD-10-CM | POA: Diagnosis present

## 2024-01-09 DIAGNOSIS — Z794 Long term (current) use of insulin: Secondary | ICD-10-CM | POA: Insufficient documentation

## 2024-02-06 ENCOUNTER — Other Ambulatory Visit: Payer: Self-pay | Admitting: Licensed Clinical Social Worker

## 2024-02-07 ENCOUNTER — Telehealth: Payer: Self-pay | Admitting: Student

## 2024-02-07 ENCOUNTER — Encounter: Payer: Self-pay | Admitting: Student

## 2024-02-07 ENCOUNTER — Ambulatory Visit: Admitting: Student

## 2024-02-07 ENCOUNTER — Telehealth: Payer: Self-pay

## 2024-02-07 VITALS — BP 122/67 | HR 99 | Temp 98.1°F | Ht 67.0 in | Wt 354.4 lb

## 2024-02-07 DIAGNOSIS — E119 Type 2 diabetes mellitus without complications: Secondary | ICD-10-CM

## 2024-02-07 DIAGNOSIS — Z833 Family history of diabetes mellitus: Secondary | ICD-10-CM | POA: Diagnosis not present

## 2024-02-07 DIAGNOSIS — Z6841 Body Mass Index (BMI) 40.0 and over, adult: Secondary | ICD-10-CM

## 2024-02-07 DIAGNOSIS — Z87891 Personal history of nicotine dependence: Secondary | ICD-10-CM

## 2024-02-07 DIAGNOSIS — E785 Hyperlipidemia, unspecified: Secondary | ICD-10-CM

## 2024-02-07 DIAGNOSIS — Z794 Long term (current) use of insulin: Secondary | ICD-10-CM

## 2024-02-07 DIAGNOSIS — R030 Elevated blood-pressure reading, without diagnosis of hypertension: Secondary | ICD-10-CM | POA: Diagnosis not present

## 2024-02-07 DIAGNOSIS — Z23 Encounter for immunization: Secondary | ICD-10-CM | POA: Diagnosis not present

## 2024-02-07 DIAGNOSIS — Z79899 Other long term (current) drug therapy: Secondary | ICD-10-CM

## 2024-02-07 DIAGNOSIS — Z1211 Encounter for screening for malignant neoplasm of colon: Secondary | ICD-10-CM

## 2024-02-07 MED ORDER — MOUNJARO 2.5 MG/0.5ML ~~LOC~~ SOAJ: 2.5000 mg | SUBCUTANEOUS | 2 refills | Status: DC

## 2024-02-07 NOTE — Assessment & Plan Note (Addendum)
-  LDL 101 on 05/2023, taking pravastatin  40 mg, repeat lipids today, may change to Lipitor/Crestor Orders:   Lipid Profile

## 2024-02-07 NOTE — Patient Instructions (Signed)
 Thank you, Ms.Geofm Earnie Andrew for allowing us  to provide your care today. Today we discussed:  -Will check A1c and cholesterol today. I will call with results.  -Will check to see if Mounjaro is covered by your insurance. (If not we can trial pill form ozempic ). IF we switch to these options, you will need to STOP Januvia .   -Make sure to check on Dexcom so we have readings and monitor for low blood sugar.  -Referral for eye doctor for diabetes exam -Cologuard for colon cancer screening  Follow up: 1 month   Remember: Check your blood sugar with Dexcom!   I have ordered the following medication/changed the following medications:  Start the following medications: Meds ordered this encounter  Medications   tirzepatide (MOUNJARO) 2.5 MG/0.5ML Pen    Sig: Inject 2.5 mg into the skin once a week.    Dispense:  2 mL    Refill:  2    Should you have any questions or concerns please call the internal medicine clinic at [phone].    Brenten Janney, D.O. [facility]

## 2024-02-07 NOTE — Progress Notes (Signed)
 CC: T2DM follow-up  HPI: Lori Schmidt is a 48 y.o. female living with a history stated below and presents today for T2DM. Please see problem based assessment and plan for additional details.  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   Type 2 diabetes mellitus (HCC)     Current Outpatient Medications on File Prior to Visit  Medication Sig Dispense Refill   Accu-Chek Softclix Lancets lancets Use to check blood sugar up to 3 times daily as needed (Patient not taking: Reported on 01/02/2024) 100 each 12   Blood Glucose Monitoring Suppl (ACCU-CHEK GUIDE) w/Device KIT Use to check blood sugar up to 3 times daily as needed (Patient not taking: Reported on 01/02/2024) 1 kit 1   citalopram  (CELEXA ) 40 MG tablet TAKE 1 TABLET BY MOUTH EVERYDAY AT BEDTIME 90 tablet 4   Clindamycin  Phos, Once-Daily, (CLINDAGEL) 1 % GEL Apply 1 application  topically daily. Apply in a thin film to affected areas daily 75 mL 2   Continuous Glucose Sensor (DEXCOM G7 SENSOR) MISC USE AS DIRECTED 3 each 11   glucose blood (ACCU-CHEK GUIDE) test strip Check blood sugar up to 3 times daily as needed (Patient not taking: Reported on 01/02/2024) 100 each 12   insulin  glargine (LANTUS  SOLOSTAR) 100 UNIT/ML Solostar Pen INJECT 100 UNITS INTO THE SKIN 2 (TWO) TIMES DAILY. 60 mL 11   Insulin  Pen Needle 32G X 4 MM MISC Use twice daily to inject insulin  120 each 11   pravastatin  (PRAVACHOL ) 40 MG tablet Take 1 tablet (40 mg total) by mouth daily. 30 tablet 11   pregabalin  (LYRICA ) 100 MG capsule Take 1 capsule (100 mg total) by mouth 3 (three) times daily. 90 capsule 2   sitaGLIPtin  (JANUVIA ) 25 MG tablet Take 1 tablet (25 mg total) by mouth daily. 90 tablet 1   No current facility-administered medications on file prior to visit.    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 Mother is [place]   Social Drivers of Health   Financial Resource Strain: Medium Risk (10/11/2023)   Overall Financial Resource Strain (CARDIA)    Difficulty of Paying Living Expenses: Somewhat hard  Food Insecurity: Food Insecurity Present (12/19/2023)   Hunger Vital Sign    Worried About Running Out  of Food in the Last Year: Sometimes true    Ran Out of Food in the Last Year: Sometimes true  Transportation Needs: No Transportation Needs (12/19/2023)   PRAPARE - Administrator, Civil Service (Medical): No    Lack of Transportation (Non-Medical): No  Physical Activity: Inactive (12/19/2023)   Exercise Vital Sign    Days of Exercise per Week: 0 days    Minutes of Exercise per Session: 0 min  Stress: Stress Concern Present (12/19/2023)   [employer] of Occupational Health - Occupational Stress Questionnaire    Feeling of Stress: Rather much  Social Connections: Moderately Isolated (10/11/2023)   Social Connection and Isolation Panel    Frequency of Communication with Friends and Family: More than three  times a week    Frequency of Social Gatherings with Friends and Family: Once a week    Attends Religious Services: More than 4 times per year    Active Member of [employer] or Organizations: No    Attends Banker Meetings: Never    Marital Status: Divorced  Catering Manager Violence: Not At Risk (10/11/2023)   Humiliation, Afraid, Rape, and Kick questionnaire    Fear of Current or Ex-Partner: No    Emotionally Abused: No    Physically Abused: No    Sexually Abused: No    Review of Systems: ROS negative except for what is noted on the assessment and plan.  Vitals:   02/07/24 1354 02/07/24 1445  BP: (!) 149/79 122/67  Pulse: (!) 104 99  Temp: 98.1 F (36.7 C)   TempSrc: Oral   SpO2: 97%   Weight: (!) 354 lb 6.4 oz (160.8 kg)   Height: 5' 7 (1.702 m)     Physical Exam Constitutional:      General: She is not in acute distress.    Appearance: Normal appearance. She is not ill-appearing.  Cardiovascular:     Rate and Rhythm: Normal rate.  Pulmonary:     Effort: Pulmonary effort is normal.  Musculoskeletal:     Right Lower Extremity: Right leg is amputated below knee.     Left Lower Extremity: Left leg is amputated below knee.  Skin:    General: Skin is warm and dry.  Neurological:     Mental Status: She is alert and oriented to person, place, and time.  Psychiatric:        Mood and Affect: Mood normal.        Behavior: Behavior normal.    Assessment & Plan:   Assessment & Plan Uncontrolled type 2 diabetes mellitus on insulin  (HCC) Status: Uncontrolled. Last A1c 9.9 in 10/2023.  A1c today is PENDING.  Taking Lantus  100 units BID, sitagliptin  25 mg.  No SGLT2: yeast infections. No metformin : GI SE. No Ozempic : SE. Monitor: Dexcom (unable to use past month due to issues w/ sensors). Patient denies feeling hypoglycemia. She is agreeable trial Mounjaro, if fails then Rybelsus , otherwise increase Januvia .   Plan -F/u in 1 month to review CGM and assess  GLP-1 -Continue Lantus  100 u BID -START Mounjaro 2.5 mg weekly x 4 weeks if tolerating then increase -If unable then consider Rybelsus  or increase Januvia  -STOP Januvia  IF approved for GLP-1 (patient is aware to stop Januvia ) -A1c today (declined POC) -Ophthalmology exam: referral sent -Urine ACR: 02/2023, unable to void today, repeat at next OV -LDL 101 on 05/2023, taking pravastatin  40 mg, repeat lipids, may change to Lipitor/Crestor  Orders:   tirzepatide (MOUNJARO) 2.5 MG/0.5ML Pen;  Inject 2.5 mg into the skin once a week.   Ambulatory referral to Ophthalmology   Hemoglobin A1c  Morbid obesity with BMI of 50.0-59.9, adult (HCC) Body mass index is 55.51 kg/m. Agreeable to trial Mounjaro and see if tolerates it. Unable to tolerate Ozempic  in the past, noted on prior OV.     Dyslipidemia -LDL 101 on 05/2023, taking pravastatin  40 mg, repeat lipids today, may change to Lipitor/Crestor Orders:   Lipid Profile  Elevated BP without diagnosis of hypertension Repeat BP 122/67 today. Continue to monitor/reassess BP at future OV.     Colon cancer screening Agreeable for Cologuard screening today. Orders:   Cologuard  Encounter for immunization Orders:   Flu vaccine trivalent PF, 6mos and older(Flulaval,Afluria,Fluarix,Fluzone)     Return in about 4 weeks (around 03/06/2024) for diabetes.   Patient discussed with Dr. CHARLENA Rosan Ozell Elicia, D.O. [facility], PGY-3 Clinic Phone: [phone] Date 02/07/2024 Time 4:01 PM

## 2024-02-07 NOTE — Assessment & Plan Note (Addendum)
 Repeat BP 122/67 today. Continue to monitor/reassess BP at future OV.

## 2024-02-07 NOTE — Telephone Encounter (Signed)
 Prior Authorization for patient (Mounjaro 2.5MG /0.5ML auto-injectors) came through on cover my meds was submitted with last office notes and labs awaiting approval or denial.  KEY:[id number]

## 2024-02-07 NOTE — Assessment & Plan Note (Addendum)
 Status: Uncontrolled. Last A1c 9.9 in 10/2023.  A1c today is PENDING.  Taking Lantus  100 units BID, sitagliptin  25 mg.  No SGLT2: yeast infections. No metformin : GI SE. No Ozempic : SE. Monitor: Dexcom (unable to use past month due to issues w/ sensors). Patient denies feeling hypoglycemia. She is agreeable trial Mounjaro, if fails then Rybelsus , otherwise increase Januvia .   Plan -F/u in 1 month to review CGM and assess GLP-1 -Continue Lantus  100 u BID -START Mounjaro 2.5 mg weekly x 4 weeks if tolerating then increase -If unable then consider Rybelsus  or increase Januvia  -STOP Januvia  IF approved for GLP-1 (patient is aware to stop Januvia ) -A1c today (declined POC) -Ophthalmology exam: referral sent -Urine ACR: 02/2023, unable to void today, repeat at next OV -LDL 101 on 05/2023, taking pravastatin  40 mg, repeat lipids, may change to Lipitor/Crestor  Orders:   tirzepatide (MOUNJARO) 2.5 MG/0.5ML Pen; Inject 2.5 mg into the skin once a week.   Ambulatory referral to Ophthalmology   Hemoglobin A1c

## 2024-02-07 NOTE — Telephone Encounter (Signed)
 Lori Schmidt (Key: ) Rx #: [id number] Mounjaro 2.5MG /0.5ML auto-injectors Form [employer] [place]  Medicaid Electronic PA Form [phone] NCPDP) Created 39 minutes ago Sent to Plan 4 minutes ago Plan Response 3 minutes ago Submit Clinical Questions 1 minute ago Determination Favorable 1 minute ago Message from Plan PA Case: [id number], Status: Approved, Coverage Starts on: 02/07/2024 12:00:00 AM, Coverage Ends on: 02/06/2025 12:00:00 AM.. Authorization Expiration Date: February 06, 2025.  Lvm for patient regarding the approval. Per Dr.Zheng if she decides to start taking the Mounjaro she will need to STOP TAKING JANUVIA .

## 2024-02-07 NOTE — Telephone Encounter (Signed)
 Call rtn. New form being faxed.  Request is for an Attending to Sign instead of the Resident for Ins purposes.  Copied from CRM [id number]. Topic: Referral - Question >> Feb 06, 2024  9:35 AM Farrel B wrote: Reason for CRM: Ms. Odella ORN, [phone], referral for prosthetics replacement sockets Ms. Odella ORN stated that the referral was signed off on by a resident and insurance is not allowing. Insurance is requesting Dr. Ronnald Sergeant sign off on the referral so that it can be resubmitted back to the insurance company for approval. Please call Ms. Odella W to advise. She stated this had been faxed over around the beginning of September but is re-faxing today.

## 2024-02-07 NOTE — Assessment & Plan Note (Addendum)
 Body mass index is 55.51 kg/m. Agreeable to trial Mounjaro and see if tolerates it. Unable to tolerate Ozempic  in the past, noted on prior OV.

## 2024-02-08 ENCOUNTER — Encounter: Payer: Self-pay | Admitting: Student

## 2024-02-08 ENCOUNTER — Ambulatory Visit: Payer: Self-pay | Admitting: Student

## 2024-02-08 LAB — HEMOGLOBIN A1C
Est. average glucose Bld gHb Est-mCnc: 214 mg/dL
Hgb A1c MFr Bld: 9.1 % — ABNORMAL HIGH (ref 4.8–5.6)

## 2024-02-08 LAB — LIPID PANEL
Chol/HDL Ratio: 5.4 ratio — ABNORMAL HIGH (ref 0.0–4.4)
Cholesterol, Total: 200 mg/dL — ABNORMAL HIGH (ref 100–199)
HDL: 37 mg/dL — ABNORMAL LOW (ref >39)
LDL Chol Calc (NIH): 112 mg/dL — ABNORMAL HIGH (ref 0–99)
Triglycerides: 292 mg/dL — ABNORMAL HIGH (ref 0–149)
VLDL Cholesterol Cal: 51 mg/dL — ABNORMAL HIGH (ref 5–40)

## 2024-02-08 MED ORDER — ATORVASTATIN CALCIUM 40 MG PO TABS: 40.0000 mg | ORAL_TABLET | Freq: Every day | ORAL | 3 refills | Status: AC

## 2024-02-08 NOTE — Addendum Note (Signed)
 Addended by: ELICIA SHARPER on: 02/08/2024 12:04 PM   Modules accepted: Orders

## 2024-02-08 NOTE — Progress Notes (Signed)
 Internal Medicine Clinic Attending  Case discussed with the resident at the time of the visit.  We reviewed the resident's history and exam and pertinent patient test results.  I agree with the assessment, diagnosis, and plan of care documented in the resident's note.

## 2024-02-12 ENCOUNTER — Encounter: Payer: Self-pay | Admitting: Radiology

## 2024-02-14 ENCOUNTER — Ambulatory Visit: Admitting: Family Medicine

## 2024-03-06 ENCOUNTER — Ambulatory Visit

## 2024-03-11 ENCOUNTER — Other Ambulatory Visit: Payer: Self-pay

## 2024-03-11 ENCOUNTER — Ambulatory Visit

## 2024-03-11 VITALS — BP 128/84 | HR 93 | Temp 98.0°F | Ht 67.0 in | Wt 355.2 lb

## 2024-03-11 DIAGNOSIS — F419 Anxiety disorder, unspecified: Secondary | ICD-10-CM | POA: Diagnosis not present

## 2024-03-11 DIAGNOSIS — E1142 Type 2 diabetes mellitus with diabetic polyneuropathy: Secondary | ICD-10-CM | POA: Diagnosis present

## 2024-03-11 DIAGNOSIS — Z7985 Long-term (current) use of injectable non-insulin antidiabetic drugs: Secondary | ICD-10-CM

## 2024-03-11 DIAGNOSIS — Z79899 Other long term (current) drug therapy: Secondary | ICD-10-CM | POA: Diagnosis not present

## 2024-03-11 DIAGNOSIS — Z9181 History of falling: Secondary | ICD-10-CM

## 2024-03-11 DIAGNOSIS — Z87891 Personal history of nicotine dependence: Secondary | ICD-10-CM

## 2024-03-11 DIAGNOSIS — E119 Type 2 diabetes mellitus without complications: Secondary | ICD-10-CM

## 2024-03-11 DIAGNOSIS — Z833 Family history of diabetes mellitus: Secondary | ICD-10-CM | POA: Diagnosis not present

## 2024-03-11 DIAGNOSIS — Z794 Long term (current) use of insulin: Secondary | ICD-10-CM | POA: Diagnosis not present

## 2024-03-11 MED ORDER — MOUNJARO 5 MG/0.5ML ~~LOC~~ SOAJ: 5.0000 mg | SUBCUTANEOUS | 1 refills | Status: DC

## 2024-03-11 MED ORDER — PREGABALIN 100 MG PO CAPS: 100.0000 mg | ORAL_CAPSULE | Freq: Three times a day (TID) | ORAL | 2 refills | Status: AC

## 2024-03-11 MED ORDER — CITALOPRAM HYDROBROMIDE 40 MG PO TABS: 40.0000 mg | ORAL_TABLET | Freq: Every day | ORAL | 3 refills | Status: AC

## 2024-03-11 NOTE — Assessment & Plan Note (Addendum)
 Last A1c completed on 02/07/2024 was improved to 9.1 from 9.9 in 10/2023.  She was taking insulin  glargine 100 units twice daily and sitagliptin  25 mg daily.  She was initiated on Mounjaro  2.5 mg weekly and instructed that if she was tolerating this well, then to discontinue the sitagliptin  which patient confirms today that she did discontinue as instructed.  She denies any episodes of hypoglycemia, and reports that next Monday 12/8 will be week 4 of her Mounjaro  2.5 mg weekly dosing.  We discussed increasing to Mounjaro  5 mg weekly starting on Monday 12/15 for at least 4 weeks.  If she tolerates this increase, then advised her that she may contact us  through MyChart to increase her to Mounjaro  7.5 mg weekly for at least 4 weeks.  I have sent a 64-month supply of the Mounjaro  5 mg weekly as we discussed side effects which can occur with increasing GLP-1, and that some patients may need to go slower when increasing.  At her last visit lipid panel obtained which showed LDL of 112 and she was switched from pravastatin  to atorvastatin  40 mg daily.  Ophthalmology referral was sent which is still pending authorization and I discussed with her that if this is ultimately denied by insurance, then would recommend optometrist visit with [facility] or [facility] for retinopathy screening.  We will see her back in 2 months to review her CGM, obtain A1c, and obtain lipid panel. - Continue Lantus  insulin  glargine 100 units BID - Continue Mounjaro  2.5 mg weekly, then on Monday 12/15 increase to Mounjaro  5 mg weekly for at least 4 weeks; she will MyChart message me if she wishes to increase to Mounjaro  7.5 mg weekly after 4 weeks at the 5 mg dosing. If she stays on 5 mg weekly until her 2 month f/u, please discuss increasing to Mounjaro  7.5 mg weekly at that time.  - f/u UACR - Eye exam authorization pending; discussed [facility] or [facility] for retinopathy screening if authorization denied by insurance - f/u in 2 months for repeat A1C,  repeat lipid panel with LDL goal < 70 and recommend increase Atorvastatin  if still not within goal at that time

## 2024-03-11 NOTE — Assessment & Plan Note (Signed)
 No current SI or HI, mood is well-controlled on Celexa  40 mg daily. She is requesting a refill of this.  - Refill Celexa  40 mg daily

## 2024-03-11 NOTE — Progress Notes (Signed)
 Patient name: Lori Schmidt Date of birth: 03/24/1976 Date of visit: 03/11/24  Type of visit: Established Patient Office Visit   Subjective   Chief concern:  Chief Complaint  Patient presents with   Follow-up    Routine office visit with medication refill / patient had a recent fall - with no injury/  DM follow up     Rosilyn Coachman is a 48 y.o. female with a history of uncontrolled DMII, bilateral below the knee amputations, HLD  who presents to [facility] clinic for follow up of chronic conditions.  She is accompanied by her daughter for the visit today.  Patient returns for a 1 month follow-up regarding her diabetes management.  She was last seen by Dr. Elicia in the [facility] on 02/07/2024.  A1c at that time had improved from 9.9 in 10/2023 to 9.1.  She was continued on Lantus  100 units twice daily, and initiated on Mounjaro  2.5 mg weekly for 4 weeks.  Patient confirmed that she did stop the Januvia  as Mounjaro  was not causing side effects.  She takes her Mounjaro  weekly on Mondays, and today 12/1 would be 3/4 and next Monday 12/8 will be 4/4 of Mounjaro  2.5 mg weekly.  She states that she has been tolerating the Mounjaro  very well and would like to increase to 5 mg once she has completed 4 weeks on the 2.5 mg dosing.  She is also requesting a refill of her citalopram  and pregabalin .   ROS: Negative unless otherwise listed in the HPI.  Patient Active Problem List   Diagnosis Date Noted   Mobility impaired 12/19/2023   Encounter for screening for cervical cancer 12/08/2023   Abnormal uterine bleeding 11/06/2023   Hidradenitis suppurativa of right axilla 10/11/2023   Scalp cyst 10/11/2023   Elevated BP without diagnosis of hypertension 05/22/2023   Iron Deficiency Anemia 08/22/2022   S/P BKA (below knee amputation), right (HCC) 03/24/2022   Encounter for birth control 11/10/2021   Anxiety 10/26/2021   Dyslipidemia    Diabetic peripheral neuropathy (HCC)    S/P BKA (below  knee amputation), left (HCC) 03/05/2021   Adjustment disorder with mixed anxiety and depressed mood    Uncontrolled type 2 diabetes mellitus on insulin  (HCC)    Morbid obesity with BMI of 50.0-59.9, adult (HCC) 01/05/2012     Past Surgical History:  Procedure Laterality Date   AMPUTATION Left 02/19/2021   Procedure: AMPUTATION BELOW KNEE;  Surgeon: Harden Jerona GAILS, MD;  Location: [facility] OR;  Service: Orthopedics;  Laterality: Left;   AMPUTATION Right 03/23/2022   Procedure: RIGHT BELOW KNEE AMPUTATION;  Surgeon: Harden Jerona GAILS, MD;  Location: [facility] OR;  Service: Orthopedics;  Laterality: Right;   APPLICATION OF WOUND VAC Right 05/13/2022   Procedure: APPLICATION OF WOUND VAC;  Surgeon: Harden Jerona GAILS, MD;  Location: [facility] OR;  Service: Orthopedics;  Laterality: Right;   BREAST CYST EXCISION Right 05/2018   four cysts removed on right breast/axilla area   STUMP REVISION Right 04/15/2022   Procedure: REVISION RIGHT BELOW KNEE AMPUTATION;  Surgeon: Harden Jerona GAILS, MD;  Location: [facility] OR;  Service: Orthopedics;  Laterality: Right;   STUMP REVISION Right 05/13/2022   Procedure: RIGHT BELOW THE KNEE AMPUTATION REVISION;  Surgeon: Harden Jerona GAILS, MD;  Location: [facility] OR;  Service: Orthopedics;  Laterality: Right;   TEE WITHOUT CARDIOVERSION N/A 02/23/2021   Procedure: TRANSESOPHAGEAL ECHOCARDIOGRAM (TEE);  Surgeon: Mona Vinie BROCKS, MD;  Location: [facility] ENDOSCOPY;  Service: Cardiovascular;  Laterality: N/A;  Current Outpatient Medications  Medication Instructions   Accu-Chek Softclix Lancets lancets Use to check blood sugar up to 3 times daily as needed   atorvastatin  (LIPITOR) 40 mg, Oral, Daily   Blood Glucose Monitoring Suppl (ACCU-CHEK GUIDE) w/Device KIT Use to check blood sugar up to 3 times daily as needed   citalopram  (CELEXA ) 40 mg, Oral, Daily   Clindamycin  Phos, Once-Daily, (CLINDAGEL) 1 % GEL 1 application , Apply externally, Daily, Apply in a thin film to affected areas daily   Continuous Glucose  Sensor (DEXCOM G7 SENSOR) MISC See admin instructions   glucose blood (ACCU-CHEK GUIDE) test strip Check blood sugar up to 3 times daily as needed   insulin  glargine (LANTUS  SOLOSTAR) 100 UNIT/ML Solostar Pen INJECT 100 UNITS INTO THE SKIN 2 (TWO) TIMES DAILY.   Insulin  Pen Needle 32G X 4 MM MISC Use twice daily to inject insulin    Mounjaro  2.5 mg, Subcutaneous, Weekly   Mounjaro  5 mg, Subcutaneous, Weekly   pregabalin  (LYRICA ) 100 mg, Oral, 3 times daily    Social History   Tobacco Use   Smoking status: Former    Current packs/day: 0.00    Types: Cigarettes    Quit date: 12/11/2010    Years since quitting: 13.2   Smokeless tobacco: Never  Vaping Use   Vaping status: Never Used  Substance Use Topics   Alcohol use: No   Drug use: No      Objective  Today's Vitals   03/11/24 1015 03/11/24 1113  BP: 136/83 128/84  Pulse: 95 93  Temp: 98 F (36.7 C)   TempSrc: Oral   SpO2: 98%   Weight: (!) 355 lb 3.2 oz (161.1 kg)   Height: 5' 7 (1.702 m)   Body mass index is 55.63 kg/m.   Physical Exam:   Constitutional: well-appearing female sitting in exam chair, in no acute distress. Ambulates with use of assistance device bilateral LE prosthetics  HEENT: normocephalic atraumatic, mucous membranes moist Cardiovascular: regular rate and rhythm, bilateral radial pulses 2+ Pulmonary/Chest: normal work of breathing on room air, lungs clear to auscultation bilaterally Abdominal: soft, non-tender, non-distended MSK: normal bulk and tone. Bilateral BKA with prosthetics in place  Neurological: alert & oriented x 3 Skin: warm and dry Psych: mood calm, behavior normal, thought content normal, judgement normal    Last hemoglobin A1c Lab Results  Component Value Date   HGBA1C 9.1 (H) 02/07/2024      The 10-year ASCVD risk score (Arnett DK, et al., 2019) is: 4.6%   Values used to calculate the score:     Age: 48 years     Clincally relevant sex: Female     Is Non-Hispanic African  American: No     Diabetic: Yes     Tobacco smoker: No     Systolic Blood Pressure: 128 mmHg     Is BP treated: Yes     HDL Cholesterol: 37 mg/dL     Total Cholesterol: 200 mg/dL      Assessment & Plan  Problem List Items Addressed This Visit       Endocrine   Uncontrolled type 2 diabetes mellitus on insulin  (HCC) - Primary (Chronic)   Last A1c completed on 02/07/2024 was improved to 9.1 from 9.9 in 10/2023.  She was taking insulin  glargine 100 units twice daily and sitagliptin  25 mg daily.  She was initiated on Mounjaro  2.5 mg weekly and instructed that if she was tolerating this well, then to discontinue the sitagliptin  which patient  confirms today that she did discontinue as instructed.  She denies any episodes of hypoglycemia, and reports that next Monday 12/8 will be week 4 of her Mounjaro  2.5 mg weekly dosing.  We discussed increasing to Mounjaro  5 mg weekly starting on Monday 12/15 for at least 4 weeks.  If she tolerates this increase, then advised her that she may contact us  through MyChart to increase her to Mounjaro  7.5 mg weekly for at least 4 weeks.  I have sent a 29-month supply of the Mounjaro  5 mg weekly as we discussed side effects which can occur with increasing GLP-1, and that some patients may need to go slower when increasing.  At her last visit lipid panel obtained which showed LDL of 112 and she was switched from pravastatin  to atorvastatin  40 mg daily.  Ophthalmology referral was sent which is still pending authorization and I discussed with her that if this is ultimately denied by insurance, then would recommend optometrist visit with [facility] or [facility] for retinopathy screening.  We will see her back in 2 months to review her CGM, obtain A1c, and obtain lipid panel. - Continue Lantus  insulin  glargine 100 units BID - Continue Mounjaro  2.5 mg weekly, then on Monday 12/15 increase to Mounjaro  5 mg weekly for at least 4 weeks; she will MyChart message me if she wishes to increase  to Mounjaro  7.5 mg weekly after 4 weeks at the 5 mg dosing. If she stays on 5 mg weekly until her 2 month f/u, please discuss increasing to Mounjaro  7.5 mg weekly at that time.  - f/u UACR - Eye exam authorization pending; discussed [facility] or [facility] for retinopathy screening if authorization denied by insurance - f/u in 2 months for repeat A1C, repeat lipid panel with LDL goal < 70 and recommend increase Atorvastatin  if still not within goal at that time      Relevant Medications   tirzepatide  (MOUNJARO ) 5 MG/0.5ML Pen   Other Relevant Orders   Microalbumin / creatinine urine ratio   Diabetic peripheral neuropathy (HCC)   Tolerates Lyrica  well. PDMP reviewed.  - Refilled Lyrica  100 mg TID       Relevant Medications   tirzepatide  (MOUNJARO ) 5 MG/0.5ML Pen   pregabalin  (LYRICA ) 100 MG capsule   citalopram  (CELEXA ) 40 MG tablet     Other   Anxiety   No current SI or HI, mood is well-controlled on Celexa  40 mg daily. She is requesting a refill of this.  - Refill Celexa  40 mg daily       Relevant Medications   citalopram  (CELEXA ) 40 MG tablet    Return in about 2 months (around 05/12/2024) for A1C recheck, BP recheck.  Patient discussed with Dr. Lovie, who also saw and evaluated the patient.  Doyal Miyamoto, MD [facility]  PGY-1 03/11/2024, 11:26 AM

## 2024-03-11 NOTE — Assessment & Plan Note (Signed)
 Tolerates Lyrica  well. PDMP reviewed.  - Refilled Lyrica  100 mg TID

## 2024-03-11 NOTE — Patient Instructions (Addendum)
 Thank you, Lori Schmidt for allowing us  to provide your care today. Today we discussed the following:  - Please increase Mounjaro  to 5 mg weekly for 4 weeks starting on Monday 12/15. I sent in a 2 month supply, but if you wish to increase to Mounjaro  7.5 mg weekly after 4 weeks on the 5 mg, please send us  a MyChart message to send the increased dose    I have ordered the following labs for you:   Lab Orders         Microalbumin / creatinine urine ratio       I have ordered the following medication/changed the following medications:   Start the following medications: Meds ordered this encounter  Medications   tirzepatide  (MOUNJARO ) 5 MG/0.5ML Pen    Sig: Inject 5 mg into the skin once a week.    Dispense:  2 mL    Refill:  1    Increase to 5 mg weekly after she completes 2.5 mg weekly for 4 weeks   pregabalin  (LYRICA ) 100 MG capsule    Sig: Take 1 capsule (100 mg total) by mouth 3 (three) times daily.    Dispense:  90 capsule    Refill:  2    Not to exceed 4 additional fills before 09/17/2023   citalopram  (CELEXA ) 40 MG tablet    Sig: Take 1 tablet (40 mg total) by mouth daily.    Dispense:  90 tablet    Refill:  3     Follow up: 2 months   Remember: Send us  a MyChart message if you wish to increase to Mounjaro  7.5 mg weekly after 4 weeks of Mounjaro  5 mg weekly before your next visit   Should you have any questions or concerns please call the Internal Medicine Clinic at [phone].     Doyal Miyamoto, MD [facility]

## 2024-03-12 ENCOUNTER — Ambulatory Visit: Payer: Self-pay

## 2024-03-12 LAB — MICROALBUMIN / CREATININE URINE RATIO
Creatinine, Urine: 118.6 mg/dL
Microalb/Creat Ratio: 61 mg/g{creat} — ABNORMAL HIGH (ref 0–29)
Microalbumin, Urine: 71.9 ug/mL

## 2024-03-12 NOTE — Progress Notes (Signed)
Internal Medicine Clinic Attending  I was physically present during the key portions of the resident provided service and participated in the medical decision making of patient's management care. I reviewed pertinent patient test results.  The assessment, diagnosis, and plan were formulated together and I agree with the documentation in the resident's note.  Mercie Eon, MD

## 2024-04-03 ENCOUNTER — Other Ambulatory Visit: Payer: Self-pay | Admitting: Licensed Clinical Social Worker

## 2024-04-09 ENCOUNTER — Other Ambulatory Visit: Payer: Self-pay | Admitting: Student

## 2024-04-09 DIAGNOSIS — L732 Hidradenitis suppurativa: Secondary | ICD-10-CM

## 2024-04-12 ENCOUNTER — Other Ambulatory Visit: Payer: Self-pay

## 2024-04-12 DIAGNOSIS — Z794 Long term (current) use of insulin: Secondary | ICD-10-CM

## 2024-04-12 MED ORDER — INSULIN PEN NEEDLE 32G X 4 MM MISC: 11 refills | Status: AC

## 2024-04-12 NOTE — Telephone Encounter (Signed)
 Medication sent to pharmacy

## 2024-05-09 ENCOUNTER — Telehealth: Payer: Self-pay | Admitting: Dietician

## 2024-05-09 NOTE — Telephone Encounter (Signed)
 Patient  agreed to an appointment same day as doctor next week.. She states she has not been wearing CGM but has not felt low. She states she is feeling better now with gym membership, going to gym and eating more fruits and vegetables.

## 2024-05-10 ENCOUNTER — Other Ambulatory Visit: Payer: Self-pay

## 2024-05-10 MED ORDER — MOUNJARO 7.5 MG/0.5ML ~~LOC~~ SOAJ: 7.5000 mg | SUBCUTANEOUS | 1 refills | Status: AC

## 2024-05-10 NOTE — Telephone Encounter (Signed)
 Patient stated she was able to tolerate the 5 mg does. Patient stated when she was taking the 5 mg she felt like the medication is wearing off and she is feeling more hungry by the end of the day. Patient would like to increase to 7.5 mg.

## 2024-05-13 ENCOUNTER — Ambulatory Visit: Payer: Self-pay | Admitting: Dietician

## 2024-05-13 ENCOUNTER — Ambulatory Visit: Admitting: Student

## 2024-05-15 ENCOUNTER — Ambulatory Visit: Admitting: Dietician

## 2024-05-15 ENCOUNTER — Ambulatory Visit

## 2024-05-16 ENCOUNTER — Ambulatory Visit: Admitting: Student

## 2024-05-16 ENCOUNTER — Encounter: Payer: Self-pay | Admitting: Student

## 2024-05-16 VITALS — BP 138/86 | HR 111 | Temp 97.5°F | Ht 67.0 in | Wt 343.6 lb

## 2024-05-16 DIAGNOSIS — I10 Essential (primary) hypertension: Secondary | ICD-10-CM

## 2024-05-16 DIAGNOSIS — Z1211 Encounter for screening for malignant neoplasm of colon: Secondary | ICD-10-CM

## 2024-05-16 DIAGNOSIS — E785 Hyperlipidemia, unspecified: Secondary | ICD-10-CM

## 2024-05-16 DIAGNOSIS — E119 Type 2 diabetes mellitus without complications: Secondary | ICD-10-CM

## 2024-05-16 MED ORDER — IRBESARTAN 75 MG PO TABS: 75.0000 mg | ORAL_TABLET | Freq: Every day | ORAL | 2 refills | Status: AC

## 2024-05-16 NOTE — Addendum Note (Signed)
 Addended by: ANTONE DWAYNE SAILOR on: 05/16/2024 10:20 AM   Modules accepted: Orders

## 2024-05-16 NOTE — Assessment & Plan Note (Addendum)
 Lipid Panel     Component Value Date/Time   CHOL 200 (H) 02/07/2024 1453   TRIG 292 (H) 02/07/2024 1453   HDL 37 (L) 02/07/2024 1453   CHOLHDL 5.4 (H) 02/07/2024 1453   LDLCALC 112 (H) 02/07/2024 1453   LABVLDL 51 (H) 02/07/2024 1453   Lab Results  Component Value Date   LDLCALC 112 (H) 02/07/2024   LDLCALC 101 (H) 05/22/2023   The 10-year ASCVD risk score (Arnett DK, et al., 2019) is: 5.4%  Chronic, not at goal on pravastatin . Recently transitioned to atorvastatin  40 mg daily. Check lipids today.  Orders:   Lipid panel

## 2024-05-16 NOTE — Progress Notes (Addendum)
 " Patient name: Lori Schmidt Date of birth: [birth year] Date of visit: 05/16/24  Subjective  Reason for visit: Follow-up and Diabetes  Discussed the use of AI scribe software for clinical note transcription with the patient, who gave verbal consent to proceed.  History of Present Illness   Lori Schmidt is a 49 year old female with diabetes and hypertension who presents for a follow-up visit.  Glycemic control and diabetes management - Uses Mounjaro  7.5 mg weekly for diabetes - Experiences good appetite suppression with increased hunger on Sundays - Not checking blood glucose due to strong aversion to finger sticks - Has a Dexcom continuous glucose monitor at home but has not set it up due to app and login issues - No recent hypoglycemic symptoms  Hypertension and cardiovascular risk - Blood pressure elevated in clinic, typically 140-150/70-80 mmHg - Does not monitor blood pressure at home - Attributes elevated clinic readings to excitement - Uncertain of prior low-dose antihypertensive medication name - Strong family history of cardiovascular disease: sister had a stroke from a heart problem, father died of a massive heart attack at age 19, both with hypertension  Diabetes-related complications and functional status - 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   Physical  Exam Constitutional:      Appearance: Normal appearance.  Cardiovascular:     Rate and Rhythm: Regular rhythm. Tachycardia present.     Heart sounds: No murmur heard. Pulmonary:     Effort: Pulmonary effort is normal. No respiratory distress.     Breath sounds: No wheezing.  Musculoskeletal:     Comments: Bilateral BKA.  Skin:    General: Skin is warm and dry.  Neurological:     Mental Status: She is alert.     Cranial Nerves: No facial asymmetry.  Psychiatric:        Mood and Affect: Affect normal.        Speech: Speech normal.        Behavior: Behavior normal.    Physical Exam   VITALS: BP- 143/80     Results   Diagnostic Echocardiogram: Mild cardiomegaly, preserved systolic function       Assessment & Plan Uncontrolled type 2 diabetes mellitus on insulin  [facility]) Lab Results  Component Value Date   HGBA1C 9.1 (H) 02/07/2024   HGBA1C 9.9 (H) 11/06/2023   HGBA1C 9.5 (H) 08/21/2023   Chronic. Poor control with hyperglycemia and small microalbuminuria, bilateral BKA. Taking glargine 100 units BID and Mounjaro  7.5 mg weekly. Doesn't check blood sugar at home. Fingerstick is intolerable and she has been delaying applying CGM. I implored her to put on her CGM again today. Continue glargine. Continue Mounjaro , increase dose to maximum as tolerated. Eye referral authorized, she agrees to arrange appointment for exam.  Orders:   Hemoglobin A1c   Basic metabolic panel with GFR  Dyslipidemia Lipid Panel     Component Value Date/Time   CHOL 200 (H) 02/07/2024 1453   TRIG 292 (H) 02/07/2024 1453   HDL 37 (L) 02/07/2024 1453   CHOLHDL 5.4 (H) 02/07/2024 1453   LDLCALC 112 (H) 02/07/2024 1453   LABVLDL 51 (H) 02/07/2024 1453   Lab Results  Component Value Date   LDLCALC 112 (H) 02/07/2024   LDLCALC 101 (H) 05/22/2023   The 10-year ASCVD risk score (Arnett DK, et al., 2019) is: 5.4%  Chronic, not at goal on pravastatin . Recently transitioned to atorvastatin  40 mg daily.  Check lipids today.  Orders:   Lipid panel   Primary hypertension BP Readings from Last 3 Encounters:  05/16/24 138/86  03/11/24 128/84  02/07/24 122/67   Chronic stage 1 hypertension. BP ranges from 120s systolic to 150s in clinic. Used to be on low dose losartan . Restart ARB today, irbesartan  75 mg daily. Check BMP. Check BP outside of clinic and bring results to follow-up.  Orders:   Basic metabolic panel with GFR   irbesartan  (AVAPRO ) 75 MG tablet; Take 1 tablet (75 mg total) by mouth daily.  Encounter for colorectal cancer screening She has FIT testing kit at home which she has agreed to complete and send.    Return in about 3 months (around 08/13/2024) for chronic condition management, or sooner if needed.  Ozell Kung MD 05/16/2024, 8:57 AM         [1]  Outpatient Medications Prior to Visit  Medication Sig   Accu-Chek Softclix Lancets lancets Use to check blood sugar up to 3 times daily as needed (Patient not taking: Reported on 01/02/2024)   atorvastatin  (LIPITOR) 40 MG tablet Take 1 tablet (40 mg total) by mouth daily.   Blood Glucose Monitoring Suppl (ACCU-CHEK GUIDE) w/Device KIT Use to check blood sugar up to 3 times daily as needed (Patient not taking: Reported on 01/02/2024)   citalopram  (CELEXA ) 40 MG tablet Take 1 tablet (40 mg total) by mouth daily.   clindamycin  (CLINDAGEL) 1 % gel APPLY 1 APPLICATION TOPICALLY DAILY. APPLY IN A THIN FILM TO AFFECTED AREAS DAILY   Continuous Glucose Sensor (DEXCOM G7 SENSOR) MISC USE AS DIRECTED   glucose blood (ACCU-CHEK GUIDE) test strip Check blood sugar up to 3 times daily as needed (Patient not taking: Reported on 01/02/2024)   insulin  glargine (LANTUS  SOLOSTAR) 100 UNIT/ML Solostar Pen INJECT 100 UNITS INTO THE SKIN 2 (TWO) TIMES DAILY.   Insulin  Pen Needle 32G X 4 MM MISC Use twice daily to inject insulin    pregabalin  (LYRICA ) 100 MG capsule Take 1 capsule (100 mg total) by mouth 3 (three) times daily.   tirzepatide   (MOUNJARO ) 7.5 MG/0.5ML Pen Inject 7.5 mg into the skin once a week.   No facility-administered medications prior to visit.   "

## 2024-05-16 NOTE — Patient Instructions (Addendum)
 [facility] in [place], [place]    Located in: [facility] Address: [address] [place], [place], [place] [address]   Phone: [phone]  VISIT SUMMARY: Today we discussed your diabetes and hypertension management, as well as your overall health maintenance. We made adjustments to your medications and encouraged you to continue with your healthy lifestyle changes.  YOUR PLAN: TYPE 2 DIABETES MELLITUS: Your diabetes is currently managed with Mounjaro , and you have experienced decreased appetite and weight loss. You have not had any recent hypoglycemia. -Increase Mounjaro  to 15 mg weekly as tolerated. -Start using your Dexcom for continuous glucose monitoring. -We ordered blood tests to check your kidney function and electrolytes. -Continue with your diet and exercise routine.  PRIMARY HYPERTENSION: Your blood pressure is slightly elevated, and you have a family history of heart disease and stroke. -Start taking olmesartan at a low dose. -Monitor your blood pressure at home with a cuff. -Our goal is to achieve a blood pressure of 120/80 mmHg.  DYSLIPIDEMIA: We need to assess your current cholesterol levels after a recent change in medication. -We ordered blood tests to check your cholesterol levels.  GENERAL HEALTH MAINTENANCE: We discussed the importance of regular screenings and maintaining a healthy lifestyle. -Schedule an eye exam. -Complete the FIT test for colonoscopy. -Continue with your gym activities and healthy diet.  Take your prescription medications as usual on the day of your doctor visit. Unless specifically instructed, there is no need to fast prior to laboratory blood testing.  Bring all of the medications that you take (including over the counter medications and supplements) with you to every clinic visit.  This after visit summary is an important review of tests, referrals, and medication changes that were discussed during your  visit. If you have questions or concerns, call [phone]. Outside of clinic business hours, call the main hospital at [phone] and ask the operator for the on-call internal medicine resident.   Ozell Kung MD 05/16/2024, 8:54 AM

## 2024-05-16 NOTE — Progress Notes (Signed)
 Internal Medicine Clinic Attending  Case discussed with the resident at the time of the visit.  We reviewed the resident's history and exam and pertinent patient test results.  I agree with the assessment, diagnosis, and plan of care documented in the resident's note.

## 2024-05-16 NOTE — Assessment & Plan Note (Addendum)
 Lab Results  Component Value Date   HGBA1C 9.1 (H) 02/07/2024   HGBA1C 9.9 (H) 11/06/2023   HGBA1C 9.5 (H) 08/21/2023   Chronic. Poor control with hyperglycemia and small microalbuminuria, bilateral BKA. Taking glargine 100 units BID and Mounjaro  7.5 mg weekly. Doesn't check blood sugar at home. Fingerstick is intolerable and she has been delaying applying CGM. I implored her to put on her CGM again today. Continue glargine. Continue Mounjaro , increase dose to maximum as tolerated. Eye referral authorized, she agrees to arrange appointment for exam.  Orders:   Hemoglobin A1c   Basic metabolic panel with GFR

## 2024-05-17 ENCOUNTER — Ambulatory Visit: Payer: Self-pay | Admitting: Student

## 2024-05-17 DIAGNOSIS — E119 Type 2 diabetes mellitus without complications: Secondary | ICD-10-CM

## 2024-05-17 LAB — LIPID PANEL
Chol/HDL Ratio: 4.3 ratio (ref 0.0–4.4)
Cholesterol, Total: 165 mg/dL (ref 100–199)
HDL: 38 mg/dL — ABNORMAL LOW
LDL Chol Calc (NIH): 99 mg/dL (ref 0–99)
Triglycerides: 160 mg/dL — ABNORMAL HIGH (ref 0–149)
VLDL Cholesterol Cal: 28 mg/dL (ref 5–40)

## 2024-05-17 LAB — BASIC METABOLIC PANEL WITH GFR
BUN/Creatinine Ratio: 28 — ABNORMAL HIGH (ref 9–23)
BUN: 19 mg/dL (ref 6–24)
CO2: 19 mmol/L — ABNORMAL LOW (ref 20–29)
Calcium: 9.4 mg/dL (ref 8.7–10.2)
Chloride: 99 mmol/L (ref 96–106)
Creatinine, Ser: 0.68 mg/dL (ref 0.57–1.00)
Glucose: 309 mg/dL — ABNORMAL HIGH (ref 70–99)
Potassium: 4.7 mmol/L (ref 3.5–5.2)
Sodium: 138 mmol/L (ref 134–144)
eGFR: 107 mL/min/{1.73_m2}

## 2024-05-17 LAB — HEMOGLOBIN A1C
Est. average glucose Bld gHb Est-mCnc: 235 mg/dL
Hgb A1c MFr Bld: 9.8 % — ABNORMAL HIGH (ref 4.8–5.6)

## 2024-05-23 ENCOUNTER — Ambulatory Visit: Admitting: Dermatology

## 2024-05-31 ENCOUNTER — Other Ambulatory Visit: Payer: Self-pay

## 2024-08-13 ENCOUNTER — Ambulatory Visit: Payer: Self-pay | Admitting: Student
